# Patient Record
Sex: Female | Born: 1935 | Race: White | Hispanic: No | Smoking: Never smoker
Health system: Southern US, Community
[De-identification: ages and names within clinical notes are randomized; demographics above are authoritative.]

## PROBLEM LIST (undated history)

## (undated) DIAGNOSIS — E559 Vitamin D deficiency, unspecified: Secondary | ICD-10-CM

## (undated) DIAGNOSIS — D126 Benign neoplasm of colon, unspecified: Secondary | ICD-10-CM

## (undated) DIAGNOSIS — J209 Acute bronchitis, unspecified: Secondary | ICD-10-CM

## (undated) DIAGNOSIS — K219 Gastro-esophageal reflux disease without esophagitis: Secondary | ICD-10-CM

## (undated) DIAGNOSIS — I10 Essential (primary) hypertension: Secondary | ICD-10-CM

## (undated) DIAGNOSIS — N289 Disorder of kidney and ureter, unspecified: Secondary | ICD-10-CM

## (undated) DIAGNOSIS — K5730 Diverticulosis of large intestine without perforation or abscess without bleeding: Secondary | ICD-10-CM

## (undated) DIAGNOSIS — E039 Hypothyroidism, unspecified: Secondary | ICD-10-CM

## (undated) DIAGNOSIS — E7800 Pure hypercholesterolemia, unspecified: Secondary | ICD-10-CM

## (undated) DIAGNOSIS — N183 Chronic kidney disease, stage 3 (moderate): Secondary | ICD-10-CM

## (undated) DIAGNOSIS — I872 Venous insufficiency (chronic) (peripheral): Secondary | ICD-10-CM

## (undated) DIAGNOSIS — F411 Generalized anxiety disorder: Secondary | ICD-10-CM

## (undated) DIAGNOSIS — R002 Palpitations: Secondary | ICD-10-CM

## (undated) DIAGNOSIS — M1990 Unspecified osteoarthritis, unspecified site: Secondary | ICD-10-CM

## (undated) HISTORY — PX: CATARACT EXTRACTION: SUR2

## (undated) HISTORY — DX: Unspecified osteoarthritis, unspecified site: M19.90

## (undated) HISTORY — DX: Essential (primary) hypertension: I10

## (undated) HISTORY — DX: Vitamin D deficiency, unspecified: E55.9

## (undated) HISTORY — DX: Pure hypercholesterolemia, unspecified: E78.00

## (undated) HISTORY — DX: Acute bronchitis, unspecified: J20.9

## (undated) HISTORY — DX: Benign neoplasm of colon, unspecified: D12.6

## (undated) HISTORY — DX: Diverticulosis of large intestine without perforation or abscess without bleeding: K57.30

## (undated) HISTORY — PX: ABDOMINAL HYSTERECTOMY: SHX81

## (undated) HISTORY — PX: BREAST BIOPSY: SHX20

## (undated) HISTORY — DX: Generalized anxiety disorder: F41.1

## (undated) HISTORY — DX: Hypothyroidism, unspecified: E03.9

## (undated) HISTORY — DX: Gastro-esophageal reflux disease without esophagitis: K21.9

## (undated) HISTORY — DX: Palpitations: R00.2

## (undated) HISTORY — DX: Venous insufficiency (chronic) (peripheral): I87.2

## (undated) HISTORY — DX: Disorder of kidney and ureter, unspecified: N28.9

---

## 1997-09-23 HISTORY — PX: TOTAL KNEE ARTHROPLASTY: SHX125

## 1998-05-29 ENCOUNTER — Emergency Department (HOSPITAL_COMMUNITY): Admission: EM | Admit: 1998-05-29 | Discharge: 1998-05-29 | Payer: Self-pay | Admitting: Emergency Medicine

## 2003-04-24 ENCOUNTER — Inpatient Hospital Stay (HOSPITAL_COMMUNITY): Admission: EM | Admit: 2003-04-24 | Discharge: 2003-05-03 | Payer: Self-pay

## 2003-04-26 ENCOUNTER — Encounter: Payer: Self-pay | Admitting: Pulmonary Disease

## 2003-04-27 ENCOUNTER — Encounter: Payer: Self-pay | Admitting: Cardiology

## 2003-04-29 ENCOUNTER — Encounter: Payer: Self-pay | Admitting: Pulmonary Disease

## 2003-05-02 ENCOUNTER — Encounter: Payer: Self-pay | Admitting: Pulmonary Disease

## 2003-05-13 ENCOUNTER — Ambulatory Visit (HOSPITAL_COMMUNITY): Admission: RE | Admit: 2003-05-13 | Discharge: 2003-05-13 | Payer: Self-pay | Admitting: Pulmonary Disease

## 2003-05-13 ENCOUNTER — Encounter: Payer: Self-pay | Admitting: Pulmonary Disease

## 2003-08-29 ENCOUNTER — Ambulatory Visit (HOSPITAL_COMMUNITY): Admission: RE | Admit: 2003-08-29 | Discharge: 2003-08-29 | Payer: Self-pay | Admitting: Obstetrics and Gynecology

## 2003-11-27 ENCOUNTER — Emergency Department (HOSPITAL_COMMUNITY): Admission: AD | Admit: 2003-11-27 | Discharge: 2003-11-27 | Payer: Self-pay | Admitting: Family Medicine

## 2004-06-18 ENCOUNTER — Emergency Department (HOSPITAL_COMMUNITY): Admission: EM | Admit: 2004-06-18 | Discharge: 2004-06-18 | Payer: Self-pay | Admitting: Family Medicine

## 2004-06-22 ENCOUNTER — Emergency Department (HOSPITAL_COMMUNITY): Admission: EM | Admit: 2004-06-22 | Discharge: 2004-06-22 | Payer: Self-pay | Admitting: Family Medicine

## 2004-06-27 ENCOUNTER — Emergency Department (HOSPITAL_COMMUNITY): Admission: EM | Admit: 2004-06-27 | Discharge: 2004-06-27 | Payer: Self-pay | Admitting: Family Medicine

## 2004-07-26 ENCOUNTER — Ambulatory Visit: Payer: Self-pay | Admitting: Pulmonary Disease

## 2004-08-01 ENCOUNTER — Ambulatory Visit: Payer: Self-pay | Admitting: Pulmonary Disease

## 2004-08-15 ENCOUNTER — Ambulatory Visit: Payer: Self-pay | Admitting: Pulmonary Disease

## 2004-10-01 ENCOUNTER — Ambulatory Visit (HOSPITAL_COMMUNITY): Admission: RE | Admit: 2004-10-01 | Discharge: 2004-10-01 | Payer: Self-pay | Admitting: Obstetrics and Gynecology

## 2004-10-24 ENCOUNTER — Ambulatory Visit: Payer: Self-pay | Admitting: Pulmonary Disease

## 2005-01-28 ENCOUNTER — Ambulatory Visit: Payer: Self-pay | Admitting: Pulmonary Disease

## 2005-02-12 ENCOUNTER — Ambulatory Visit: Payer: Self-pay | Admitting: Gastroenterology

## 2005-02-14 ENCOUNTER — Ambulatory Visit: Payer: Self-pay | Admitting: Gastroenterology

## 2005-02-25 ENCOUNTER — Ambulatory Visit: Payer: Self-pay | Admitting: Gastroenterology

## 2005-05-31 ENCOUNTER — Ambulatory Visit: Payer: Self-pay | Admitting: Pulmonary Disease

## 2005-07-22 ENCOUNTER — Emergency Department (HOSPITAL_COMMUNITY): Admission: EM | Admit: 2005-07-22 | Discharge: 2005-07-23 | Payer: Self-pay | Admitting: Emergency Medicine

## 2005-08-30 ENCOUNTER — Ambulatory Visit: Payer: Self-pay | Admitting: Pulmonary Disease

## 2005-11-21 ENCOUNTER — Ambulatory Visit: Payer: Self-pay | Admitting: Pulmonary Disease

## 2005-11-22 ENCOUNTER — Ambulatory Visit: Payer: Self-pay

## 2005-11-25 ENCOUNTER — Ambulatory Visit: Payer: Self-pay | Admitting: Pulmonary Disease

## 2005-11-25 ENCOUNTER — Ambulatory Visit: Payer: Self-pay | Admitting: Infectious Diseases

## 2005-11-25 ENCOUNTER — Inpatient Hospital Stay (HOSPITAL_COMMUNITY): Admission: AD | Admit: 2005-11-25 | Discharge: 2005-12-08 | Payer: Self-pay | Admitting: Pulmonary Disease

## 2005-12-12 ENCOUNTER — Ambulatory Visit: Payer: Self-pay | Admitting: Pulmonary Disease

## 2005-12-22 ENCOUNTER — Emergency Department (HOSPITAL_COMMUNITY): Admission: EM | Admit: 2005-12-22 | Discharge: 2005-12-22 | Payer: Self-pay | Admitting: Emergency Medicine

## 2006-03-12 ENCOUNTER — Ambulatory Visit: Payer: Self-pay | Admitting: Pulmonary Disease

## 2006-03-13 ENCOUNTER — Emergency Department (HOSPITAL_COMMUNITY): Admission: EM | Admit: 2006-03-13 | Discharge: 2006-03-14 | Payer: Self-pay | Admitting: Emergency Medicine

## 2006-03-20 ENCOUNTER — Ambulatory Visit: Payer: Self-pay | Admitting: Pulmonary Disease

## 2006-04-28 ENCOUNTER — Ambulatory Visit: Payer: Self-pay | Admitting: Pulmonary Disease

## 2006-06-11 ENCOUNTER — Ambulatory Visit: Payer: Self-pay | Admitting: Pulmonary Disease

## 2006-07-16 ENCOUNTER — Ambulatory Visit: Payer: Self-pay | Admitting: Pulmonary Disease

## 2006-09-10 ENCOUNTER — Ambulatory Visit: Payer: Self-pay | Admitting: Pulmonary Disease

## 2006-09-27 ENCOUNTER — Emergency Department (HOSPITAL_COMMUNITY): Admission: EM | Admit: 2006-09-27 | Discharge: 2006-09-27 | Payer: Self-pay | Admitting: Emergency Medicine

## 2007-01-08 ENCOUNTER — Ambulatory Visit: Payer: Self-pay | Admitting: Pulmonary Disease

## 2007-01-08 LAB — CONVERTED CEMR LAB
ALT: 14 units/L (ref 0–40)
AST: 17 units/L (ref 0–37)
Albumin: 3.7 g/dL (ref 3.5–5.2)
Alkaline Phosphatase: 71 units/L (ref 39–117)
BUN: 24 mg/dL — ABNORMAL HIGH (ref 6–23)
Basophils Absolute: 0 10*3/uL (ref 0.0–0.1)
Basophils Relative: 0.3 % (ref 0.0–1.0)
Bilirubin, Direct: 0.1 mg/dL (ref 0.0–0.3)
CO2: 31 meq/L (ref 19–32)
Calcium: 9.6 mg/dL (ref 8.4–10.5)
Chloride: 103 meq/L (ref 96–112)
Creatinine, Ser: 1.2 mg/dL (ref 0.4–1.2)
Eosinophils Absolute: 0 10*3/uL (ref 0.0–0.6)
Eosinophils Relative: 0.8 % (ref 0.0–5.0)
GFR calc Af Amer: 54 mL/min
GFR calc non Af Amer: 45 mL/min
Glucose, Bld: 106 mg/dL — ABNORMAL HIGH (ref 70–99)
HCT: 40.3 % (ref 36.0–46.0)
Hemoglobin: 13.4 g/dL (ref 12.0–15.0)
Lymphocytes Relative: 42.3 % (ref 12.0–46.0)
MCHC: 33.2 g/dL (ref 30.0–36.0)
MCV: 85.1 fL (ref 78.0–100.0)
Monocytes Absolute: 0.4 10*3/uL (ref 0.2–0.7)
Monocytes Relative: 7.1 % (ref 3.0–11.0)
Neutro Abs: 3.2 10*3/uL (ref 1.4–7.7)
Neutrophils Relative %: 49.5 % (ref 43.0–77.0)
Platelets: 203 10*3/uL (ref 150–400)
Potassium: 4.1 meq/L (ref 3.5–5.1)
RBC: 4.74 M/uL (ref 3.87–5.11)
RDW: 13.7 % (ref 11.5–14.6)
Sodium: 141 meq/L (ref 135–145)
TSH: 3.02 microintl units/mL (ref 0.35–5.50)
Total Bilirubin: 0.7 mg/dL (ref 0.3–1.2)
Total Protein: 7 g/dL (ref 6.0–8.3)
WBC: 6.2 10*3/uL (ref 4.5–10.5)

## 2007-07-07 ENCOUNTER — Ambulatory Visit: Payer: Self-pay | Admitting: Pulmonary Disease

## 2007-07-07 LAB — CONVERTED CEMR LAB
ALT: 13 units/L (ref 0–35)
AST: 16 units/L (ref 0–37)
Albumin: 3.8 g/dL (ref 3.5–5.2)
Alkaline Phosphatase: 73 units/L (ref 39–117)
BUN: 28 mg/dL — ABNORMAL HIGH (ref 6–23)
Basophils Absolute: 0 10*3/uL (ref 0.0–0.1)
Basophils Relative: 0.4 % (ref 0.0–1.0)
Bilirubin, Direct: 0.1 mg/dL (ref 0.0–0.3)
CO2: 32 meq/L (ref 19–32)
Calcium: 9.6 mg/dL (ref 8.4–10.5)
Chloride: 102 meq/L (ref 96–112)
Creatinine, Ser: 1.4 mg/dL — ABNORMAL HIGH (ref 0.4–1.2)
Eosinophils Absolute: 0.1 10*3/uL (ref 0.0–0.6)
Eosinophils Relative: 0.9 % (ref 0.0–5.0)
GFR calc Af Amer: 45 mL/min
GFR calc non Af Amer: 38 mL/min
Glucose, Bld: 102 mg/dL — ABNORMAL HIGH (ref 70–99)
HCT: 40.3 % (ref 36.0–46.0)
Hemoglobin: 12.8 g/dL (ref 12.0–15.0)
Lymphocytes Relative: 33.2 % (ref 12.0–46.0)
MCHC: 31.9 g/dL (ref 30.0–36.0)
MCV: 84.4 fL (ref 78.0–100.0)
Monocytes Absolute: 0.5 10*3/uL (ref 0.2–0.7)
Monocytes Relative: 8.7 % (ref 3.0–11.0)
Neutro Abs: 3.3 10*3/uL (ref 1.4–7.7)
Neutrophils Relative %: 56.8 % (ref 43.0–77.0)
Platelets: 233 10*3/uL (ref 150–400)
Potassium: 5.3 meq/L — ABNORMAL HIGH (ref 3.5–5.1)
Pro B Natriuretic peptide (BNP): 61 pg/mL (ref 0.0–100.0)
RBC: 4.78 M/uL (ref 3.87–5.11)
RDW: 14.1 % (ref 11.5–14.6)
Sodium: 140 meq/L (ref 135–145)
Total Bilirubin: 0.5 mg/dL (ref 0.3–1.2)
Total Protein: 7.2 g/dL (ref 6.0–8.3)
WBC: 5.8 10*3/uL (ref 4.5–10.5)

## 2007-07-08 DIAGNOSIS — K219 Gastro-esophageal reflux disease without esophagitis: Secondary | ICD-10-CM | POA: Insufficient documentation

## 2007-07-08 DIAGNOSIS — M1990 Unspecified osteoarthritis, unspecified site: Secondary | ICD-10-CM

## 2007-07-08 DIAGNOSIS — E7800 Pure hypercholesterolemia, unspecified: Secondary | ICD-10-CM | POA: Insufficient documentation

## 2007-07-08 DIAGNOSIS — E039 Hypothyroidism, unspecified: Secondary | ICD-10-CM | POA: Insufficient documentation

## 2007-07-08 DIAGNOSIS — F411 Generalized anxiety disorder: Secondary | ICD-10-CM | POA: Insufficient documentation

## 2007-07-08 DIAGNOSIS — I10 Essential (primary) hypertension: Secondary | ICD-10-CM

## 2007-07-08 DIAGNOSIS — I872 Venous insufficiency (chronic) (peripheral): Secondary | ICD-10-CM

## 2007-07-08 DIAGNOSIS — Z8679 Personal history of other diseases of the circulatory system: Secondary | ICD-10-CM | POA: Insufficient documentation

## 2007-07-08 DIAGNOSIS — J209 Acute bronchitis, unspecified: Secondary | ICD-10-CM

## 2007-07-23 ENCOUNTER — Ambulatory Visit: Payer: Self-pay | Admitting: Pulmonary Disease

## 2007-07-23 LAB — CONVERTED CEMR LAB
BUN: 27 mg/dL — ABNORMAL HIGH (ref 6–23)
CO2: 31 meq/L (ref 19–32)
Calcium: 9.3 mg/dL (ref 8.4–10.5)
Chloride: 102 meq/L (ref 96–112)
Creatinine, Ser: 1.5 mg/dL — ABNORMAL HIGH (ref 0.4–1.2)
GFR calc Af Amer: 42 mL/min
GFR calc non Af Amer: 35 mL/min
Glucose, Bld: 111 mg/dL — ABNORMAL HIGH (ref 70–99)
Potassium: 4.8 meq/L (ref 3.5–5.1)
Sodium: 140 meq/L (ref 135–145)

## 2007-09-15 ENCOUNTER — Encounter: Payer: Self-pay | Admitting: Pulmonary Disease

## 2007-11-09 ENCOUNTER — Ambulatory Visit: Payer: Self-pay | Admitting: Pulmonary Disease

## 2007-11-09 DIAGNOSIS — K5730 Diverticulosis of large intestine without perforation or abscess without bleeding: Secondary | ICD-10-CM

## 2007-11-09 DIAGNOSIS — D126 Benign neoplasm of colon, unspecified: Secondary | ICD-10-CM

## 2007-11-24 ENCOUNTER — Encounter: Payer: Self-pay | Admitting: Pulmonary Disease

## 2007-12-13 ENCOUNTER — Emergency Department (HOSPITAL_COMMUNITY): Admission: EM | Admit: 2007-12-13 | Discharge: 2007-12-13 | Payer: Self-pay | Admitting: Family Medicine

## 2008-03-03 ENCOUNTER — Encounter: Payer: Self-pay | Admitting: Pulmonary Disease

## 2008-05-09 ENCOUNTER — Ambulatory Visit: Payer: Self-pay | Admitting: Pulmonary Disease

## 2008-05-09 DIAGNOSIS — H409 Unspecified glaucoma: Secondary | ICD-10-CM | POA: Insufficient documentation

## 2008-05-10 LAB — CONVERTED CEMR LAB
ALT: 14 units/L (ref 0–35)
AST: 15 units/L (ref 0–37)
Albumin: 4 g/dL (ref 3.5–5.2)
Alkaline Phosphatase: 64 units/L (ref 39–117)
BUN: 44 mg/dL — ABNORMAL HIGH (ref 6–23)
Basophils Absolute: 0.1 10*3/uL (ref 0.0–0.1)
Basophils Relative: 0.7 % (ref 0.0–3.0)
Bilirubin, Direct: 0.1 mg/dL (ref 0.0–0.3)
CO2: 30 meq/L (ref 19–32)
Calcium: 9.2 mg/dL (ref 8.4–10.5)
Chloride: 106 meq/L (ref 96–112)
Creatinine, Ser: 1.9 mg/dL — ABNORMAL HIGH (ref 0.4–1.2)
Eosinophils Absolute: 0 10*3/uL (ref 0.0–0.7)
Eosinophils Relative: 0.5 % (ref 0.0–5.0)
GFR calc Af Amer: 32 mL/min
GFR calc non Af Amer: 26 mL/min
Glucose, Bld: 105 mg/dL — ABNORMAL HIGH (ref 70–99)
HCT: 38.5 % (ref 36.0–46.0)
Hemoglobin: 13.3 g/dL (ref 12.0–15.0)
Lymphocytes Relative: 30.2 % (ref 12.0–46.0)
MCHC: 34.6 g/dL (ref 30.0–36.0)
MCV: 87.8 fL (ref 78.0–100.0)
Monocytes Absolute: 0.5 10*3/uL (ref 0.1–1.0)
Monocytes Relative: 6.2 % (ref 3.0–12.0)
Neutro Abs: 5.2 10*3/uL (ref 1.4–7.7)
Neutrophils Relative %: 62.4 % (ref 43.0–77.0)
Platelets: 229 10*3/uL (ref 150–400)
Potassium: 4.6 meq/L (ref 3.5–5.1)
RBC: 4.39 M/uL (ref 3.87–5.11)
RDW: 13.6 % (ref 11.5–14.6)
Sodium: 140 meq/L (ref 135–145)
TSH: 3.02 microintl units/mL (ref 0.35–5.50)
Total Bilirubin: 0.8 mg/dL (ref 0.3–1.2)
Total Protein: 7.2 g/dL (ref 6.0–8.3)
WBC: 8.3 10*3/uL (ref 4.5–10.5)

## 2008-06-15 ENCOUNTER — Ambulatory Visit: Payer: Self-pay | Admitting: Pulmonary Disease

## 2008-10-13 ENCOUNTER — Encounter: Payer: Self-pay | Admitting: Pulmonary Disease

## 2008-10-24 ENCOUNTER — Telehealth (INDEPENDENT_AMBULATORY_CARE_PROVIDER_SITE_OTHER): Payer: Self-pay | Admitting: *Deleted

## 2008-11-07 ENCOUNTER — Ambulatory Visit: Payer: Self-pay | Admitting: Pulmonary Disease

## 2008-11-13 DIAGNOSIS — E559 Vitamin D deficiency, unspecified: Secondary | ICD-10-CM | POA: Insufficient documentation

## 2008-11-13 DIAGNOSIS — N259 Disorder resulting from impaired renal tubular function, unspecified: Secondary | ICD-10-CM | POA: Insufficient documentation

## 2008-11-13 LAB — CONVERTED CEMR LAB
ALT: 14 units/L (ref 0–35)
Basophils Relative: 2.6 % (ref 0.0–3.0)
Bilirubin, Direct: 0.1 mg/dL (ref 0.0–0.3)
CO2: 28 meq/L (ref 19–32)
Calcium: 9.5 mg/dL (ref 8.4–10.5)
Chloride: 107 meq/L (ref 96–112)
Creatinine, Ser: 1.7 mg/dL — ABNORMAL HIGH (ref 0.4–1.2)
Eosinophils Relative: 2.1 % (ref 0.0–5.0)
GFR calc non Af Amer: 30 mL/min
Hemoglobin: 13 g/dL (ref 12.0–15.0)
Lymphocytes Relative: 32.7 % (ref 12.0–46.0)
MCHC: 33.4 g/dL (ref 30.0–36.0)
MCV: 90 fL (ref 78.0–100.0)
RBC: 4.33 M/uL (ref 3.87–5.11)
Sodium: 142 meq/L (ref 135–145)
Total Bilirubin: 0.6 mg/dL (ref 0.3–1.2)
Vit D, 25-Hydroxy: 30 ng/mL (ref 30–89)
WBC: 9.3 10*3/uL (ref 4.5–10.5)

## 2008-11-16 ENCOUNTER — Encounter: Payer: Self-pay | Admitting: Pulmonary Disease

## 2008-11-21 HISTORY — PX: TOTAL KNEE ARTHROPLASTY: SHX125

## 2008-12-05 ENCOUNTER — Inpatient Hospital Stay (HOSPITAL_COMMUNITY): Admission: RE | Admit: 2008-12-05 | Discharge: 2008-12-09 | Payer: Self-pay | Admitting: Orthopaedic Surgery

## 2008-12-12 ENCOUNTER — Telehealth (INDEPENDENT_AMBULATORY_CARE_PROVIDER_SITE_OTHER): Payer: Self-pay

## 2008-12-26 ENCOUNTER — Ambulatory Visit: Payer: Self-pay | Admitting: Internal Medicine

## 2009-02-07 ENCOUNTER — Encounter: Payer: Self-pay | Admitting: Pulmonary Disease

## 2009-03-29 ENCOUNTER — Ambulatory Visit: Payer: Self-pay | Admitting: Pulmonary Disease

## 2009-03-31 LAB — CONVERTED CEMR LAB
BUN: 54 mg/dL — ABNORMAL HIGH (ref 6–23)
Basophils Relative: 3.3 % — ABNORMAL HIGH (ref 0.0–3.0)
Calcium: 9.4 mg/dL (ref 8.4–10.5)
Creatinine, Ser: 2 mg/dL — ABNORMAL HIGH (ref 0.4–1.2)
Eosinophils Relative: 1.3 % (ref 0.0–5.0)
GFR calc non Af Amer: 24.76 mL/min (ref >60)
HCT: 40.8 % (ref 36.0–46.0)
Hemoglobin: 13.6 g/dL (ref 12.0–15.0)
Iron: 62 ug/dL (ref 42–145)
MCV: 85.3 fL (ref 78.0–100.0)
Monocytes Relative: 3.1 % (ref 3.0–12.0)
Platelets: 85 10*3/uL — ABNORMAL LOW (ref 150.0–400.0)
Saturation Ratios: 16.6 % — ABNORMAL LOW (ref 20.0–50.0)
Transferrin: 267.1 mg/dL (ref 212.0–360.0)
Vit D, 25-Hydroxy: 39 ng/mL (ref 30–89)
WBC: 17.7 10*3/uL — ABNORMAL HIGH (ref 4.5–10.5)

## 2009-04-03 ENCOUNTER — Encounter: Payer: Self-pay | Admitting: Pulmonary Disease

## 2009-04-19 ENCOUNTER — Encounter: Payer: Self-pay | Admitting: Pulmonary Disease

## 2009-06-06 ENCOUNTER — Encounter: Payer: Self-pay | Admitting: Pulmonary Disease

## 2009-06-21 ENCOUNTER — Ambulatory Visit: Payer: Self-pay | Admitting: Pulmonary Disease

## 2009-07-14 ENCOUNTER — Telehealth: Payer: Self-pay | Admitting: Pulmonary Disease

## 2009-07-27 ENCOUNTER — Ambulatory Visit: Payer: Self-pay | Admitting: Pulmonary Disease

## 2009-07-27 LAB — CONVERTED CEMR LAB
CO2: 32 meq/L (ref 19–32)
Calcium: 9.6 mg/dL (ref 8.4–10.5)
Creatinine, Ser: 1.8 mg/dL — ABNORMAL HIGH (ref 0.4–1.2)
GFR calc non Af Amer: 27.94 mL/min (ref >60)
Sodium: 143 meq/L (ref 135–145)

## 2009-08-23 HISTORY — PX: TOE AMPUTATION: SHX809

## 2009-10-19 ENCOUNTER — Encounter: Payer: Self-pay | Admitting: Pulmonary Disease

## 2009-12-04 ENCOUNTER — Telehealth: Payer: Self-pay | Admitting: Pulmonary Disease

## 2009-12-08 ENCOUNTER — Ambulatory Visit: Payer: Self-pay | Admitting: Pulmonary Disease

## 2010-01-05 ENCOUNTER — Telehealth (INDEPENDENT_AMBULATORY_CARE_PROVIDER_SITE_OTHER): Payer: Self-pay | Admitting: *Deleted

## 2010-01-11 ENCOUNTER — Telehealth (INDEPENDENT_AMBULATORY_CARE_PROVIDER_SITE_OTHER): Payer: Self-pay | Admitting: *Deleted

## 2010-01-11 ENCOUNTER — Ambulatory Visit: Payer: Self-pay | Admitting: Pulmonary Disease

## 2010-02-20 ENCOUNTER — Encounter: Payer: Self-pay | Admitting: Pulmonary Disease

## 2010-03-27 ENCOUNTER — Telehealth (INDEPENDENT_AMBULATORY_CARE_PROVIDER_SITE_OTHER): Payer: Self-pay | Admitting: *Deleted

## 2010-04-06 ENCOUNTER — Ambulatory Visit: Payer: Self-pay | Admitting: Pulmonary Disease

## 2010-04-10 DIAGNOSIS — B029 Zoster without complications: Secondary | ICD-10-CM | POA: Insufficient documentation

## 2010-05-03 ENCOUNTER — Ambulatory Visit: Payer: Self-pay | Admitting: Pulmonary Disease

## 2010-05-04 LAB — CONVERTED CEMR LAB
ALT: 11 units/L (ref 0–35)
AST: 17 units/L (ref 0–37)
BUN: 42 mg/dL — ABNORMAL HIGH (ref 6–23)
Basophils Relative: 0.6 % (ref 0.0–3.0)
Bilirubin, Direct: 0.1 mg/dL (ref 0.0–0.3)
Chloride: 100 meq/L (ref 96–112)
Eosinophils Relative: 0.4 % (ref 0.0–5.0)
GFR calc non Af Amer: 23.74 mL/min (ref >60)
HCT: 39.6 % (ref 36.0–46.0)
Lymphs Abs: 2.3 10*3/uL (ref 0.7–4.0)
Monocytes Relative: 7.8 % (ref 3.0–12.0)
Neutrophils Relative %: 61.8 % (ref 43.0–77.0)
Platelets: 260 10*3/uL (ref 150.0–400.0)
Potassium: 4.3 meq/L (ref 3.5–5.1)
RBC: 4.31 M/uL (ref 3.87–5.11)
Total Bilirubin: 0.6 mg/dL (ref 0.3–1.2)
Total Protein: 6.9 g/dL (ref 6.0–8.3)
WBC: 7.7 10*3/uL (ref 4.5–10.5)

## 2010-05-11 ENCOUNTER — Telehealth: Payer: Self-pay | Admitting: Pulmonary Disease

## 2010-06-05 ENCOUNTER — Encounter: Payer: Self-pay | Admitting: Pulmonary Disease

## 2010-06-07 ENCOUNTER — Ambulatory Visit: Payer: Self-pay | Admitting: Pulmonary Disease

## 2010-07-09 ENCOUNTER — Encounter: Payer: Self-pay | Admitting: Pulmonary Disease

## 2010-10-09 ENCOUNTER — Emergency Department (HOSPITAL_COMMUNITY)
Admission: EM | Admit: 2010-10-09 | Discharge: 2010-10-09 | Payer: Self-pay | Source: Home / Self Care | Admitting: Emergency Medicine

## 2010-10-10 LAB — CBC
HCT: 39.7 % (ref 36.0–46.0)
Hemoglobin: 12.6 g/dL (ref 12.0–15.0)
MCH: 28.8 pg (ref 26.0–34.0)
MCHC: 31.7 g/dL (ref 30.0–36.0)
MCV: 90.6 fL (ref 78.0–100.0)
Platelets: 278 10*3/uL (ref 150–400)
RBC: 4.38 MIL/uL (ref 3.87–5.11)
RDW: 13.2 % (ref 11.5–15.5)
WBC: 11.4 10*3/uL — ABNORMAL HIGH (ref 4.0–10.5)

## 2010-10-10 LAB — DIFFERENTIAL
Basophils Absolute: 0 10*3/uL (ref 0.0–0.1)
Basophils Relative: 0 % (ref 0–1)
Eosinophils Absolute: 0.1 10*3/uL (ref 0.0–0.7)
Eosinophils Relative: 1 % (ref 0–5)
Lymphocytes Relative: 18 % (ref 12–46)
Lymphs Abs: 2.1 10*3/uL (ref 0.7–4.0)
Monocytes Absolute: 0.8 10*3/uL (ref 0.1–1.0)
Monocytes Relative: 7 % (ref 3–12)
Neutro Abs: 8.5 10*3/uL — ABNORMAL HIGH (ref 1.7–7.7)
Neutrophils Relative %: 74 % (ref 43–77)

## 2010-10-10 LAB — URINALYSIS, ROUTINE W REFLEX MICROSCOPIC
Bilirubin Urine: NEGATIVE
Hgb urine dipstick: NEGATIVE
Ketones, ur: NEGATIVE mg/dL
Nitrite: NEGATIVE
Protein, ur: NEGATIVE mg/dL
Specific Gravity, Urine: 1.011 (ref 1.005–1.030)
Urine Glucose, Fasting: NEGATIVE mg/dL
Urobilinogen, UA: 0.2 mg/dL (ref 0.0–1.0)
pH: 5.5 (ref 5.0–8.0)

## 2010-10-10 LAB — COMPREHENSIVE METABOLIC PANEL
ALT: 12 U/L (ref 0–35)
AST: 16 U/L (ref 0–37)
Albumin: 3.7 g/dL (ref 3.5–5.2)
Alkaline Phosphatase: 57 U/L (ref 39–117)
BUN: 56 mg/dL — ABNORMAL HIGH (ref 6–23)
CO2: 32 mEq/L (ref 19–32)
Calcium: 9.6 mg/dL (ref 8.4–10.5)
Chloride: 93 mEq/L — ABNORMAL LOW (ref 96–112)
Creatinine, Ser: 2.67 mg/dL — ABNORMAL HIGH (ref 0.4–1.2)
GFR calc Af Amer: 20 mL/min — ABNORMAL LOW (ref >60)
GFR calc non Af Amer: 17 mL/min — ABNORMAL LOW (ref >60)
Glucose, Bld: 146 mg/dL — ABNORMAL HIGH (ref 70–99)
Potassium: 4.3 mEq/L (ref 3.5–5.1)
Sodium: 138 mEq/L (ref 135–145)
Total Bilirubin: 0.2 mg/dL — ABNORMAL LOW (ref 0.3–1.2)
Total Protein: 6.6 g/dL (ref 6.0–8.3)

## 2010-10-10 LAB — LIPASE, BLOOD: Lipase: 21 U/L (ref 11–59)

## 2010-10-10 LAB — URINE MICROSCOPIC-ADD ON

## 2010-10-11 ENCOUNTER — Telehealth: Payer: Self-pay | Admitting: Pulmonary Disease

## 2010-10-15 ENCOUNTER — Ambulatory Visit
Admission: RE | Admit: 2010-10-15 | Discharge: 2010-10-15 | Payer: Self-pay | Source: Home / Self Care | Attending: Pulmonary Disease | Admitting: Pulmonary Disease

## 2010-10-15 DIAGNOSIS — R609 Edema, unspecified: Secondary | ICD-10-CM | POA: Insufficient documentation

## 2010-10-25 NOTE — Assessment & Plan Note (Signed)
Summary: rov 3 months/ cxr/ fasting labs ///kp   Primary Care Laquentin Loudermilk:  Kriste Basque  CC:  4 month ROV & review of mult medical problems....  History of Present Illness: 77 y/o WF here for a follow up visit... she has mult med problems as noted below...   ~  3/09 saw DrCohen at [facility] clinic for f/u of prev corneal transplant & glaucoma laser surg... they sent her to [facility] w/ further surg by DrMuir due to high eye pressures... on glaucoma gtts.  ~  she continues to be followed every 56mo by [facility] who does her Cholesterol & Thyroid checks... she tells me that she is doing well on her Crestor5 & Synthroid 125mg - 1/2 tab daily...   ~  March 29, 2009:  she is s/p left TKR by DrYates 3/10- went to [facility] and now back home doing well... she had f/u at [name] 5/10 on 2 drops and doing satis... she saw DrGegick 5/10 w/ Fasting labs done- her Chol was elevated but she was off the Crestor due to cost, thyroid function OK on her Synthroid... labs showed BUN=54, Creat=2.0 and we decr Demedex to20mg /d & Aldactone25 to 1/2 tab daily...   ~  July 27, 2009:  since she was here last she has decr the diuretics as above- weight up 3# w/ 1+pitting in LE's, BUN= 38, Creat=1.8.Marland KitchenMarland Kitchen she's had right second toe part amput by DrBednarz (hammer toe + spur) & epidural steroid shot by DrNewton for LBP (helped)... she requests MOBIC refill & has already had her 2010 flu  shot...   ~  January 11, 2010:  called c/o cough, congestion, yellow phlegm & incr SOB... no f/c/s, no CP, etc... she took ZPak & phlegm cleared but still congested, wheezing, coughing, SOB> exam w/ exp wheezing & rhonchi; CXR w/ incr markings (?vague nodular opac on left)> we decided to Rx w/ Depo/ Pred, Mucinex/ Fluids, Tussionex Prn, & ROV recheck... Otherw BP= OK;  Chol Rx ch by DrGegick to Zocor20;  Thyroid stable on 5mcg/d;  using Alpraz 0.25 to sleep at night...   ~  May 03, 2010:  she had Shingles right T9-10 distrib> resolved after  Valtrex, Vicodin Rx... c/o pain right hip area w/ shot DrYates- no benefit;  then eval DrNewton w/ epid steroid shot- only sl better... BP remains controlled on meds & fluid status stable;  Chol looks good on Simva20 per DrGegick, & also on Synthroid 1/2 of the dose... she wants f/u labs here today...   Current Problem List:  GLAUCOMA (ICD-365.9) - see above, mult eye problems... on eye drops per Ophthalmology at [facility]  ASTHMATIC BRONCHITIS, ACUTE (ICD-466.0) - started w/ URI cough yellow sputum w/ min improvement after ZPak... has incr congestion wheezing rhonchi & we decided to Rx w/ Depo/ Pred/ Mucinex/ Tussionex...  ~  CXR 4/11 showed vague nodular densities on left... we plan f/u film after Rx.  HYPERTENSION (ICD-401.9) - controlled on ASA 81mg /d,  CARTIAXT 180mg /d, DEMEDEX 20mg /d and ALDACTONE 25mg - 1/2 daily... BP= 130/78 and she is tol meds well... denies HA, fatigue, visual changes, CP, palipit, dizziness, syncope, dyspnea, etc... sl incr edema noted...  ~  7/10:  labs showed BUN=54, Creat=2.0 & rec to decr diuretics in half= one Demedex & 1/2 Aldactone.  ~  11/10:  labs showed BUN=38, Creat=1.8 & rec to keep same meds.  ~  8/11:  labs showed BUN=42, Creat=2.1, K=4.3  PALPITATIONS, HX OF (ICD-V12.50)  VENOUS INSUFFICIENCY (ICD-459.81) - swelling  controlled w/ diuretics, low sodium diet, etc...  HYPERCHOLESTEROLEMIA (ICD-272.0) - regulated and Rx'd by DrGegick... she tells me he switched her to ZOCOR 20mg /d from the prev Crestor5 & Lescol that she has been on for years... she was intol to Lipitor due to muscle aching... she says she has to battle her insurance company for meds that DrG wants.  ~  she brought labs from DrG 5/10 (off med due to $$$) w/ TChol 285, TG 204, HDL 69, LDL 195... "he was mad at me"  ~  FLP 5/11 from [facility] showed TChol 202, TG 60, HDL 112, LDL 99  HYPOTHYROIDISM (ICD-244.9) - also followed by DrGegick on SYNTHROID - 1/2 daily...  ~  pt brought  labs from [facility] 5/10 w/ TSH= 2.90, FreeT4= 1.09  ~  labs 8/11 showed TSH= 1.87  GERD (ICD-530.81) - uses PRILOSEC 20mg /d... last EGD was 6/06 by DrPatterson showing 3cmHH, reflux...   DIVERTICULOSIS OF COLON (ICD-562.10) & COLONIC POLYPS (ICD-211.3) - last colonoscopy 6/06 was WNL- no abnormality seen...  RENAL INSUFFICIENCY (ICD-588.9)  ~  labs 10/08 showed BUN= 27, Creat= 1.5  ~  labs 8/09 showed BUN= 44, Creat= 1.9  ~  labs 2/10 showed BUN 46, Creat= 1.7  ~  labs 7/10 showed BUN= 54, Creat= 2.0  ~  labs 11/10 showed BUN= 38, Creat= 1.8  ~  labs 8/11 showed BUN= 42, Creat= 2.1  DEGENERATIVE JOINT DISEASE (ICD-715.90) - this is her chief complaint- s/p right TKR in 1999,  left TKR 3/10... on Vicodin Prn.  VITAMIN D DEFICIENCY (ICD-268.9)  ~  labs 8/09 showed Vit D level = 12... rec- start Vit D 50,000 u weekly...  ~  labs 2/10 showed Vit D level = 30... rec- continue Vit D 50K weekly...  ~  labs 7/10 showed Vit D level = 39... rec- change to 1000 u OTC daily.  ~  labs 8/11 showed Vit D level = 38... continue same.  ANXIETY (ICD-300.00) - on ALPRAZOLAM 0.25mg Tid Prn... several deaths in the family... daughter who lives w/ her recently ill...   Preventive Screening-Counseling & Management  Alcohol-Tobacco     Smoking Status: never  Allergies: 1)  Sulfa 2)  Biaxin (Clarithromycin) 3)  Codeine 4)  Morphine  Comments:  Nurse/Medical Assistant: The patient's medications and allergies were reviewed with the patient and were updated in the Medication and Allergy Lists.  Past History:  Past Medical History: GLAUCOMA (ICD-365.9) Hx of ASTHMATIC BRONCHITIS, ACUTE (ICD-466.0) HYPERTENSION (ICD-401.9) PALPITATIONS, HX OF (ICD-V12.50) VENOUS INSUFFICIENCY (ICD-459.81) HYPERCHOLESTEROLEMIA (ICD-272.0) HYPOTHYROIDISM (ICD-244.9) GERD (ICD-530.81) DIVERTICULOSIS OF COLON (ICD-562.10) COLONIC POLYPS (ICD-211.3) RENAL INSUFFICIENCY (ICD-588.9) DEGENERATIVE JOINT DISEASE  (ICD-715.90) VITAMIN D DEFICIENCY (ICD-268.9) ANXIETY (ICD-300.00)  Past Surgical History: S/P cataract surg S/P Hysterectomy S/P benign breast biopsy S/P right total knee replacement in 1999 S/P left TKR 3/10 by DrYates S/P right second toe part amputation by Volney Presser 2010  Family History: Reviewed history from 05/09/2008 and no changes required. mother deceased age 74 from heart problems father deceased age 88 from heart problems  hx of throat cancer 1 sibling alive age 17  hx of DM 1 sibling deceased from cancer  Social History: Reviewed history from 11/07/2008 and no changes required. never smoked no etoh widowed 1 daughter -  hx of DM  Review of Systems      See HPI       The patient complains of decreased hearing, dyspnea on exertion, muscle weakness, and difficulty walking.  The patient denies anorexia, fever, weight loss,  weight gain, vision loss, hoarseness, chest pain, syncope, peripheral edema, prolonged cough, headaches, hemoptysis, abdominal pain, melena, hematochezia, severe indigestion/heartburn, hematuria, incontinence, suspicious skin lesions, transient blindness, depression, unusual weight change, abnormal bleeding, enlarged lymph nodes, and angioedema.    Vital Signs:  Patient profile:   77 year old female Height:      63 inches Weight:      159.13 pounds BMI:     28.29 O2 Sat:      97 % on Room air Temp:     97.9 degrees F oral Pulse rate:   97 / minute BP sitting:   130 / 78  (left arm) Cuff size:   regular  Vitals Entered By: Randell Loop CMA (May 03, 2010 11:27 AM)  O2 Sat at Rest %:  97 O2 Flow:  Room air CC: 4 month ROV & review of mult medical problems... Is Patient Diabetic? No Pain Assessment Patient in pain? yes      Onset of pain  hip and back pain Comments meds updated today with pt   Physical Exam  Additional Exam:  WD, WN, 77 y/o WF in NAD... GENERAL:  Alert & oriented; pleasant & cooperative... HEENT:  [place]/AT, EOM-  full, EACs-clear, TMs-wnl, NOSE-clear, THROAT-clear & wnl. NECK:  Supple w/ fairROM; no JVD; normal carotid impulses w/o bruits; no thyromegaly or nodules palpated; no lymphadenopathy. CHEST:  Clear, decr BS bilat w/o wheezing, rales, or rhonchi heard... HEART:  Regular Rhythm;  gr 1/6 SEM without rubs or gallops detected... ABDOMEN:  Soft & nontender; normal bowel sounds; no organomegaly or masses detected. EXT:  moderate arthritic changes; s/p bilat TKRs; mild varicose veins/ +venous insuffic/ 1+edema. s/p right second toe distal amputation... NEURO:  CN's intact;  no focal neuro deficits... DERM:  No lesions noted; no rash etc...    MISC. Report  Procedure date:  05/03/2010  Findings:      BMP (METABOL)   Sodium                    142 mEq/L                   135-145   Potassium                 4.3 mEq/L                   3.5-5.1   Chloride                  100 mEq/L                   96-112   Carbon Dioxide            31 mEq/L                    19-32   Glucose              [H]  100 mg/dL                   08-65   BUN                  [H]  42 mg/dL                    7-84   Creatinine           [H]  2.1 mg/dL  0.4-1.2   Calcium                   9.5 mg/dL                   1.6-10.9   GFR                       23.74 mL/min                >60   CBC Platelet w/Diff (CBCD)   White Cell Count          7.7 K/uL                    4.5-10.5   Red Cell Count            4.31 Mil/uL                 3.87-5.11   Hemoglobin                13.1 g/dL                   60.4-54.0   Hematocrit                39.6 %                      36.0-46.0   MCV                       91.8 fl                     78.0-100.0   Platelet Count            260.0 K/uL                  150.0-400.0   Neutrophil %              61.8 %                      43.0-77.0   Lymphocyte %              29.4 %                      12.0-46.0   Monocyte %                7.8 %                       3.0-12.0    Eosinophils%              0.4 %                       0.0-5.0   Basophils %               0.6 %                       0.0-3.0  Comments:      Hepatic/Liver Function Panel (HEPATIC)   Total Bilirubin           0.6 mg/dL                   9.8-1.1   Direct Bilirubin          0.1 mg/dL  0.0-0.3   Alkaline Phosphatase      52 U/L                      39-117   AST                       17 U/L                      0-37   ALT                       11 U/L                      0-35   Total Protein             6.9 g/dL                    0.4-5.4   Albumin                   4.1 g/dL                    0.9-8.1  TSH (TSH)   FastTSH                   1.87 uIU/mL                 0.35-5.50  Vitamin D (25-Hydroxy) (19147)  Vitamin D (25-Hydroxy)                             38 ng/mL                    30-89   Impression & Recommendations:  Problem # 1:  HYPERTENSION (ICD-401.9) Controlled>  same meds, stay well hydrated... Her updated medication list for this problem includes:    Cartia Xt 180 Mg Cp24 (Diltiazem hcl coated beads) .[name]... 1 by mouth once daily    Demadex 20 Mg Tabs (Torsemide) .[name]... 1 tab by mouth once daily    Aldactone 25 Mg Tabs (Spironolactone) .[name]... 1/2 tab by mouth once daily in the afternoon...  Orders: TLB-BMP (Basic Metabolic Panel-BMET) (80048-METABOL)  Problem # 2:  VENOUS INSUFFICIENCY (ICD-459.81) Stable>  continue diuretics, no salt, etc...  Problem # 3:  HYPERCHOLESTEROLEMIA (ICD-272.0) Recent FLP from DrGegick was improved>  continue Simva20... Her updated medication list for this problem includes:    Simvastatin 20 Mg Tabs (Simvastatin) .[name]... Take 1 tab by mouth at bedtime  Orders: TLB-Hepatic/Liver Function Pnl (80076-HEPATIC)  Problem # 4:  HYPOTHYROIDISM (ICD-244.9) Stable on the Synthroid  ~62.5mg /d... Her updated medication list for this problem includes:    Synthroid 125 Mcg Tabs (Levothyroxine sodium) .[name]... Take 1/2 tablet by mouth  once daily  Orders: TLB-TSH (Thyroid Stimulating Hormone) (84443-TSH)  Problem # 5:  GERD (ICD-530.81) GI is stable and up to date... Her updated medication list for this problem includes:    Prilosec 20 Mg Cpdr (Omeprazole) .[name]... Take 1 tablet by mouth once a day  Problem # 6:  RENAL INSUFFICIENCY (ICD-588.9) Renal function shows creat= 2.1>  continue same meds & diuretics, stay well hydrated... Orders: TLB-BMP (Basic Metabolic Panel-BMET) (80048-METABOL) TLB-CBC Platelet - w/Differential (85025-CBCD)  Problem # 7:  DEGENERATIVE JOINT DISEASE (ICD-715.90) She has Mobic & Vicodin to use Prn... Her updated medication list  for this problem includes:    Aspirin 81 Mg Tbec (Aspirin) .[name]... Take 1 tablet by mouth once a day    Mobic 7.5 Mg Tabs (Meloxicam) .[name]... Take 1 tab by mouth once daily as needed for arthritis pain...    Hydrocodone-acetaminophen 5-500 Mg Tabs (Hydrocodone-acetaminophen) .[name]... Take 1/2 to 1 tab by mouth three times a day as needed  Problem # 8:  VITAMIN D DEFICIENCY (ICD-268.9) Continue Vit D 1000 u daily... Viot D level = 38. Orders: T-Vitamin D (25-Hydroxy) [id number])  Problem # 9:  ANXIETY (ICD-300.00) Stable>  uses the Alpraz Prn... Her updated medication list for this problem includes:    Alprazolam 0.25 Mg Tabs (Alprazolam) .[name]... Take 1/2 to 1 tablet by mouth three times a day as needed for nerves  Complete Medication List: 1)  Timoptic 0.5 % Soln (Timolol maleate) .[name].. 1 drop in each eye every 12 hours 2)  Fluorometholone 0.1 % Susp (Fluorometholone) .[name].. 1 drop in right eye at bedtime 3)  Aspirin 81 Mg Tbec (Aspirin) .... Take 1 tablet by mouth once a day 4)  Cartia Xt 180 Mg Cp24 (Diltiazem hcl coated beads) .[name].. 1 by mouth once daily 5)  Demadex 20 Mg Tabs (Torsemide) .[name].. 1 tab by mouth once daily 6)  Aldactone 25 Mg Tabs (Spironolactone) .... 1/2 tab by mouth once daily in the afternoon... 7)  Simvastatin 20 Mg Tabs (Simvastatin) .... Take 1 tab  by mouth at bedtime 8)  Synthroid 125 Mcg Tabs (Levothyroxine sodium) .... Take 1/2 tablet by mouth once daily 9)  Prilosec 20 Mg Cpdr (Omeprazole) .... Take 1 tablet by mouth once a day 10)  Mobic 7.5 Mg Tabs (Meloxicam) .... Take 1 tab by mouth once daily as needed for arthritis pain... 11)  Alprazolam 0.25 Mg Tabs (Alprazolam) .... Take 1/2 to 1 tablet by mouth three times a day as needed for nerves 12)  Vitamin D 1000 Unit Caps (Cholecalciferol) .... Take 1 cap by mouth once daily... 13)  Vitamin C 500 Mg Tabs (Ascorbic acid) .... Take 1 tablet by mouth once a day 14)  Mucinex 600 Mg Xr12h-tab (Guaifenesin) .... Take one tablet by mouth two times a day 15)  Hydrocodone-acetaminophen 5-500 Mg Tabs (Hydrocodone-acetaminophen) .... Take 1/2 to 1 tab by mouth three times a day as needed for pain...  Patient Instructions: 1)  Today we updated your med list- see below.... 2)  We refilled your Hydrocodone & Alprazolam for as needed use.Marland KitchenMarland Kitchen 3)  Continue follow up w/ DrYates regarding your right hip predicament... 4)  Call for any questions.Marland KitchenMarland Kitchen 5)  Please schedule a follow-up appointment in 6 months, sooner as needed... Prescriptions: HYDROCODONE-ACETAMINOPHEN 5-500 MG TABS (HYDROCODONE-ACETAMINOPHEN) take 1/2 to 1 tab by mouth three times a day as needed for pain...  #50 x 6   Entered and Authorized by:   Michele Mcalpine MD   Signed by:   Michele Mcalpine MD on 05/03/2010   Method used:   Print then Give to Patient   RxID:   [id number] ALPRAZOLAM 0.25 MG  TABS (ALPRAZOLAM) Take 1/2 to 1 tablet by mouth three times a day as needed for nerves  #100 x 5   Entered and Authorized by:   Michele Mcalpine MD   Signed by:   Michele Mcalpine MD on 05/03/2010   Method used:   Print then Give to Patient   RxID:   [id number]

## 2010-10-25 NOTE — Letter (Signed)
Summary: [facility]   Imported By: Sherian Rein 03/01/2010 09:29:29  _____________________________________________________________________  External Attachment:    Type:   Image     Comment:   External Document

## 2010-10-25 NOTE — Assessment & Plan Note (Signed)
Summary: COUGH/SOB/PT HERE AT 2:30 TODAY/[place]   Primary Care Provider:  Kriste Basque  CC:  5-6 month ROV & add-on for wheezing....  History of Present Illness: 77 y/o WF here for a follow up visit... she has mult med problems as noted below...   ~  3/09 saw DrCohen at [facility] clinic for f/u of prev corneal transplant & glaucoma laser surg... they sent her to [facility] w/ further surg by DrMuir due to high eye pressures... on glaucoma gtts.  ~  she continues to be followed every 27mo by [facility] who does her Cholesterol & Thyroid checks... she tells me that she is doing well on her Crestor5 & Synthroid 125mg - 1/2 tab daily...   ~  March 29, 2009:  she is s/p left TKR by DrYates 3/10- went to [facility] for [facility] and now back home doing well... she had f/u at [facility] 5/10 on 2 drops and doing satis... she saw DrGegick 5/10 w/ Fasting labs done- her Chol was elevated but she was off the Crestor due to cost, thyroid function OK on her Synthroid dose (see below)... labs showed BUN=54, Creat=2.0 and we decr Demedex to20mg /d & Aldactone25 to 1/2 tab daily...   ~  July 27, 2009:  since she was here last she has decr the diuretics as above- weight up 3# w/ 1+pitting in LE's, BUN= 38, Creat=1.8.Marland KitchenMarland Kitchen she's had right second toe part amput by DrBednarz (hammer toe + spur) & epidural steroid shot by DrNewton for LBP (helped)... she requests MOBIC refill & has already had her 2010 flu  shot...   ~  January 11, 2010:  called c/o cough, congestion, yellow phlegm & incr SOB... no f/c/s, no CP, etc... she took ZPak & phlegm cleared but still congested, wheezing, coughing, SOB> exam w/ exp wheezing & rhonchi; CXR w/ incr markings (?vague nodular opac on left)> we decided to Rx w/ Depo/ Pred, Mucinex/ Fluids, Tussionex Prn, & ROV recheck... Otherw BP= OK;  Chol Rx ch by DrGegick to Zocor20;  Thyroid stable on 68mcg/d;  using Alpraz 0.25 to sleep at night...    Current Problem List:  GLAUCOMA (ICD-365.9) - see above... on eye  drops per Ophthalmology.  ASTHMATIC BRONCHITIS, ACUTE (ICD-466.0) - started w/ URI cough yellow sputum w/ min improvement after ZPak... has incr congestion wheezing rhonchi & we decided to Rx w/ Depo/ Pred/ Mucinex/ Tussionex...  ~  CXR 4/11 showed vague nodular densities on left... we plan f/u film after Rx.  HYPERTENSION (ICD-401.9) - controlled on ASA 81mg /d,  CARTIAXT 180mg /d, DEMEDEX 20mg /d and ALDACTONE 25mg - 1/2 daily... BP= 144/74 and she is tol meds well... denies HA, fatigue, visual changes, CP, palipit, dizziness, syncope, dyspnea, etc... sl incr edema noted...  ~  7/10: labs showed BUN=54, Creat=2.0 & rec to decr diuretics in half= one Demedex & 1/2 Aldactone.  ~  11/10: labs showed BUN=38, Creat=1.8 & rec to keep same meds.  PALPITATIONS, HX OF (ICD-V12.50)  VENOUS INSUFFICIENCY (ICD-459.81) - swelling controlled w/ diuretics, low sodium diet, etc...  HYPERCHOLESTEROLEMIA (ICD-272.0) - regulated and Rx'd by DrGegick... she tells me he switched her to ZOCOR 20mg /d from the prev Crestor5 & Lescol that she has been on for years... she was intol to Lipitor due to muscle aching... she says she has to battle her insurance company for meds that DrG wants.  ~  she brought labs from DrG 5/10 (off med due to $$$) w/ TChol 285, TG 204, HDL 69, LDL 195... "he was mad at me"  HYPOTHYROIDISM (ICD-244.9) - also followed by DrGegick on SYNTHROID - 1/2 daily...  ~  pt brought labs from [facility] 5/10 w/ TSH= 2.90, FreeT4= 1.09  GERD (ICD-530.81) - uses PRILOSEC 20mg /d... last EGD was 6/06 by DrPatterson showing 3cmHH, reflux...   DIVERTICULOSIS OF COLON (ICD-562.10) & COLONIC POLYPS (ICD-211.3) - last colonoscopy 6/06 was WNL- no abnormality seen...  RENAL INSUFFICIENCY (ICD-588.9)  ~  labs 10/08 showed BUN= 27, Creat= 1.5  ~  labs 8/09 showed BUN= 44, Creat= 1.9  ~  labs 2/10 showed BUN 46, Creat= 1.7  ~  labs 7/10 showed BUN= 54, Creat= 2.0  ~  labs 11/10 showed BUN= 38, Creat=  1.8  DEGENERATIVE JOINT DISEASE (ICD-715.90) - this is her chief complaint- s/p right TKR in 1999,  left TKR 3/10... on Vicodin Prn.  VITAMIN D DEFICIENCY (ICD-268.9)  ~  labs 8/09 showed Vit D level = 12... rec- start Vit D 50,000 u weekly...  ~  labs 2/10 showed Vit D level = 30... rec- continue Vit D 50K weekly...  ~  labs 7/10 showed Vit D level = 39... rec- change to 1000 u OTC daily.  ANXIETY (ICD-300.00) - on ALPRAZOLAM 0.25mg Tid Prn... several deaths in the family... daughter who lives w/ her recently ill...   Allergies: 1)  Sulfa 2)  Biaxin (Clarithromycin) 3)  Codeine 4)  Morphine  Past History:  Past Medical History: GLAUCOMA (ICD-365.9) Hx of ASTHMATIC BRONCHITIS, ACUTE (ICD-466.0) HYPERTENSION (ICD-401.9) PALPITATIONS, HX OF (ICD-V12.50) VENOUS INSUFFICIENCY (ICD-459.81) HYPERCHOLESTEROLEMIA (ICD-272.0) HYPOTHYROIDISM (ICD-244.9) GERD (ICD-530.81) DIVERTICULOSIS OF COLON (ICD-562.10) COLONIC POLYPS (ICD-211.3) RENAL INSUFFICIENCY (ICD-588.9) DEGENERATIVE JOINT DISEASE (ICD-715.90) VITAMIN D DEFICIENCY (ICD-268.9) ANXIETY (ICD-300.00)  Past Surgical History: S/P cataract surg S/P Hysterectomy S/P benign breast biopsy S/P right total knee replacement in 1999 S/P left TKR 3/10 by DrYates S/P right second toe part amputation by Volney Presser 2010  Family History: Reviewed history from 05/09/2008 and no changes required. mother deceased age 53 from heart problems father deceased age 77 from heart problems  hx of throat cancer 1 sibling alive age 84  hx of DM 1 sibling deceased from cancer  Social History: Reviewed history from 11/07/2008 and no changes required. never smoked no etoh widowed 1 daughter -  hx of DM  Review of Systems      See HPI       The patient complains of decreased hearing, hoarseness, dyspnea on exertion, peripheral edema, prolonged cough, and difficulty walking.  The patient denies anorexia, fever, weight loss, weight gain, vision  loss, chest pain, syncope, headaches, hemoptysis, abdominal pain, melena, hematochezia, severe indigestion/heartburn, hematuria, incontinence, muscle weakness, suspicious skin lesions, transient blindness, depression, unusual weight change, abnormal bleeding, enlarged lymph nodes, and angioedema.    Vital Signs:  Patient profile:   78 year old female Height:      63 inches Weight:      167.38 pounds O2 Sat:      97 % on Room air Temp:     96.2 degrees F oral Pulse rate:   88 / minute BP sitting:   144 / 74  (right arm) Cuff size:   regular  Vitals Entered By: Randell Loop CMA (January 11, 2010 2:13 PM)  O2 Sat at Rest %:  97 O2 Flow:  Room air CC: 5-6 month ROV & add-on for wheezing... Is Patient Diabetic? No Comments meds updated today   Physical Exam  Additional Exam:  WD, WN, 77 y/o WF in NAD... GENERAL:  Alert & oriented; pleasant & cooperative.Marland KitchenMarland Kitchen  HEENT:  Energy/AT, EOM- full, EACs-clear, TMs-wnl, NOSE-clear, THROAT-clear & wnl. NECK:  Supple w/ fairROM; no JVD; normal carotid impulses w/o bruits; no thyromegaly or nodules palpated; no lymphadenopathy. CHEST:  Bilat exp wheezing, diffuse rhonchi, w/o consolidation or rales... HEART:  Regular Rhythm;  gr 1/6 SEM without rubs or gallops detected... ABDOMEN:  Soft & nontender; normal bowel sounds; no organomegaly or masses detected. EXT:  moderate arthritic changes; s/p bilat TKRs; mild varicose veins/ +venous insuffic/ 1+edema. s/p right second toe distal amputation... NEURO:  CN's intact;  no focal neuro deficits... DERM:  No lesions noted; no rash etc...     CXR  Procedure date:  01/11/2010  Findings:      CHEST - 2 VIEW Comparison: 09/27/2006   Findings: Heart size appears normal.   There is no pleural effusion or pulmonary edema   Coarsened interstitial markings are identified within both lungs which are unchanged from prior exam.   There are two nodular densities within the left upper lobe which appears new  from the examination dated 09/27/2006.   No superimposed airspace consolidation.   IMPRESSION:   Indeterminate nodular densities in the left upper lobe.  Recommend more definitive evaluation with CT of the chest.   Read By:  Rosealee Albee,  M.D.   Impression & Recommendations:  Problem # 1:  ASTHMATIC BRONCHITIS, ACUTE (ICD-466.0) She has a bronchitic exac... ZPak helped but congested w/ wheezing/ rhonchi...  REC> Depo80, Pred 10mg  in 7d tapering sched, Mucinex Max Bid, Fluids, Tussionex as needed. The following medications were removed from the medication list:    Keflex 500 Mg Caps (Cephalexin) .[name]... 1 by mouth four times a day    Zithromax Z-pak 250 Mg Tabs (Azithromycin) .[name]... Take as directed Her updated medication list for this problem includes:    Mucinex 600 Mg Xr12h-tab (Guaifenesin) .[name]... Take one tablet by mouth two times a day    Tussionex Pennkinetic Er 8-10 Mg/24ml Lqcr (Chlorpheniramine-hydrocodone) .[name]... Take 1 tsp by mouth every 12 h as needed for cough...  Problem # 2:  HYPERTENSION (ICD-401.9) Controlled-  same meds. Her updated medication list for this problem includes:    Cartia Xt 180 Mg Cp24 (Diltiazem hcl coated beads) .[name]... 1 by mouth once daily    Demadex 20 Mg Tabs (Torsemide) .[name]... 1 tab by mouth once daily    Aldactone 25 Mg Tabs (Spironolactone) .[name]... 1/2 tab by mouth once daily in the afternoon...  Problem # 3:  VENOUS INSUFFICIENCY (ICD-459.81) Needs to elim sodium, elevate legs, wear support hose, etc...  Problem # 4:  HYPERCHOLESTEROLEMIA (ICD-272.0) Now on zocor per DrGegick... The following medications were removed from the medication list:    Crestor 5 Mg Tabs (Rosuvastatin calcium) .[name]... Take 1 tablet by mouth once a day Her updated medication list for this problem includes:    Simvastatin 20 Mg Tabs (Simvastatin) .[name]... Take 1 tab by mouth at bedtime  Problem # 5:  HYPOTHYROIDISM (ICD-244.9) Stable on Synthroid... Her updated  medication list for this problem includes:    Synthroid 125 Mcg Tabs (Levothyroxine sodium) .[name]... Take 1/2 tablet by mouth once daily  Problem # 6:  GERD (ICD-530.81) GI is stable... Her updated medication list for this problem includes:    Prilosec 20 Mg Cpdr (Omeprazole) .[name]... Take 1 tablet by mouth once a day  Problem # 7:  RENAL INSUFFICIENCY (ICD-588.9) GU improved...  Problem # 8:  DEGENERATIVE JOINT DISEASE (ICD-715.90) She uses the Vicodin & Mobic Prn... Her updated medication  list for this problem includes:    Aspirin 81 Mg Tbec (Aspirin) .[name]... Take 1 tablet by mouth once a day    Mobic 7.5 Mg Tabs (Meloxicam) .[name]... Take 1 tab by mouth once daily as needed for arthritis pain...  Complete Medication List: 1)  Timoptic 0.5 % Soln (Timolol maleate) .[name].. 1 drop in each eye every 12 hours 2)  Fluorometholone 0.1 % Susp (Fluorometholone) .[name].. 1 drop in right eye at bedtime 3)  Aspirin 81 Mg Tbec (Aspirin) .... Take 1 tablet by mouth once a day 4)  Cartia Xt 180 Mg Cp24 (Diltiazem hcl coated beads) .[name].. 1 by mouth once daily 5)  Demadex 20 Mg Tabs (Torsemide) .[name].. 1 tab by mouth once daily 6)  Aldactone 25 Mg Tabs (Spironolactone) .... 1/2 tab by mouth once daily in the afternoon... 7)  Simvastatin 20 Mg Tabs (Simvastatin) .... Take 1 tab by mouth at bedtime 8)  Synthroid 125 Mcg Tabs (Levothyroxine sodium) .... Take 1/2 tablet by mouth once daily 9)  Prilosec 20 Mg Cpdr (Omeprazole) .... Take 1 tablet by mouth once a day 10)  Mobic 7.5 Mg Tabs (Meloxicam) .... Take 1 tab by mouth once daily as needed for arthritis pain... 11)  Alprazolam 0.25 Mg Tabs (Alprazolam) .... Take 1 tablet by mouth three times a day as needed for nerves 12)  Vitamin D 1000 Unit Caps (Cholecalciferol) .... Take 1 cap by mouth once daily... 13)  Vitamin C 500 Mg Tabs (Ascorbic acid) .... Take 1 tablet by mouth once a day 14)  Mucinex 600 Mg Xr12h-tab (Guaifenesin) .... Take one tablet by mouth two times a  day 15)  Prednisone 10 Mg Tabs (Prednisone) .... Start 1 tab by mouth two times a day for 1 wk, then 1 tab daily for 1 week, then 1 tab every other day til gone... 16)  Tussionex Pennkinetic Er 8-10 Mg/75ml Lqcr (Chlorpheniramine-hydrocodone) .... Take 1 tsp by mouth every 12 h as needed for cough...  Other Orders: T-2 View CXR (71020TC) Prescription Created Electronically [id number]) Depo- Medrol 80mg  (J1040) Admin of Therapeutic Inj  intramuscular or subcutaneous (47829)  Patient Instructions: 1)  Today we updated your med list- see below.... 2)  Continue the MUCINEX MAX one tab twice daily & lots of fluids! 3)  We decided to treat your airway inflammation w/ Prednisone- take as diected in a tapering schedule... 4)  You may use the TUSSIONEX as needed for cough... 5)  Call for any problems.Marland KitchenMarland Kitchen 6)  Please schedule a follow-up appointment in 3 months, & we will plan a follow up CXR & FASTING blood work at that time... Prescriptions: TUSSIONEX PENNKINETIC ER 8-10 MG/5ML LQCR (CHLORPHENIRAMINE-HYDROCODONE) take 1 tsp by mouth every 12 H as needed for cough...  #4 oz x 2   Entered and Authorized by:   Michele Mcalpine MD   Signed by:   Michele Mcalpine MD on 01/11/2010   Method used:   Print then Give to Patient   RxID:   [id number] PREDNISONE 10 MG TABS (PREDNISONE) start 1 tab by mouth two times a day for 1 wk, then 1 tab daily for 1 week, then 1 tab every other day til gone...  #30 x 0   Entered and Authorized by:   Michele Mcalpine MD   Signed by:   Michele Mcalpine MD on 01/11/2010   Method used:   Print then Give to Patient   RxID:   [id number]     Medication Administration  Injection # 1:    Medication: Depo- Medrol 80mg     Diagnosis: Hx of ASTHMATIC BRONCHITIS, ACUTE (ICD-466.0)    Route: IM    Site: RUOQ gluteus    Exp Date: 07/2012    Lot #: [id number]    Mfr: [employer]    Patient tolerated injection without complications    Given by: Randell Loop CMA (January 11, 2010 3:36  PM)  Orders Added: 1)  T-2 View CXR [71020TC] 2)  Prescription Created Electronically [[id number]] 3)  Est. Patient Level IV [16109] 4)  Depo- Medrol 80mg  [J1040] 5)  Admin of Therapeutic Inj  intramuscular or subcutaneous [60454]

## 2010-10-25 NOTE — Letter (Signed)
Summary: Eye exam/[facility]  Eye exam/[facility]   Imported By: Lester Chincoteague 07/03/2010 07:52:13  _____________________________________________________________________  External Attachment:    Type:   Image     Comment:   External Document

## 2010-10-25 NOTE — Letter (Signed)
Summary: [facility]   Imported By: Lennie Odor 07/19/2010 11:28:08  _____________________________________________________________________  External Attachment:    Type:   Image     Comment:   External Document

## 2010-10-25 NOTE — Assessment & Plan Note (Signed)
Summary: flu shot/apc   Nurse Visit   Allergies: 1)  Sulfa 2)  Biaxin (Clarithromycin) 3)  Codeine 4)  Morphine  Orders Added: 1)  Flu Vaccine 64yrs + MEDICARE PATIENTS [Q2039] 2)  Administration Flu vaccine - MCR [G0008] Flu Vaccine Consent Questions     Do you have a history of severe allergic reactions to this vaccine? no    Any prior history of allergic reactions to egg and/or gelatin? no    Do you have a sensitivity to the preservative Thimersol? no    Do you have a past history of Guillan-Barre Syndrome? no    Do you currently have an acute febrile illness? no    Have you ever had a severe reaction to latex? no    Vaccine information given and explained to patient? yes    Are you currently pregnant? no    Lot Number:[id number]   Exp Date:03/23/2011   Site Given  Left Deltoid IMu  Clarise Cruz [facility])  June 07, 2010 11:47 AM

## 2010-10-25 NOTE — Progress Notes (Signed)
Summary: Xanax Refill  Phone Note Refill Request Message from:  Fax from Pharmacy on March 27, 2010 4:57 PM  Refills Requested: Medication #1:  ALPRAZOLAM 0.25 MG  TABS Take 1 tablet by mouth three times a day as needed for nerves   Dosage confirmed as above?Dosage Confirmed   Brand Name Necessary? No   Supply Requested: 1 month   Last Refilled: 02/08/2010   Notes: Last seen 01/11/2010 [facility]   Method Requested: Fax to Local Pharmacy Next Appointment Scheduled: 05/03/2010 w/ Dr. Kriste Basque Initial call taken by: Michel Bickers CMA,  March 27, 2010 4:59 PM  Follow-up for Phone Call        Formfaxed back to the pharmacy.Michel Bickers CMA  March 27, 2010 4:59 PM    Prescriptions: ALPRAZOLAM 0.25 MG  TABS (ALPRAZOLAM) Take 1 tablet by mouth three times a day as needed for nerves  #90 x 1   Entered by:   Michel Bickers CMA   Authorized by:   Michele Mcalpine MD   Signed by:   Michel Bickers CMA on 03/27/2010   Method used:   Historical   RxID:   [id number]

## 2010-10-25 NOTE — Progress Notes (Signed)
Summary: appt  Phone Note Call from Patient Call back at Home Phone [phone]   Caller: Patient Call For: nadel Reason for Call: Talk to Nurse Summary of Call: cough,sob, not as bad as was but would like to be seen...keeps lingering.  Willing to see SN ot TP Initial call taken by: Eugene Gavia,  January 11, 2010 8:21 AM  Follow-up for Phone Call        called and spoke with pt. pt states she called on 01/05/2010 and was prescribed zpak and told to take mucinex.  pt states she finished zpak and is still taking mucinex.  Pt c/o increased sob esp when coughing.  pt also c/o coughing up clear to white sputum and head congestion with clear nasal drainage.  pt concerned about the increased sob.  Please advise. Thank you.  Aundra Millet Reynolds LPN  January 11, 2010 9:34 AM   allergies: sulfa, biaxin, codeine, morphine  Additional Follow-up for Phone Call Additional follow up Details #1::        called and spoke with pt---she is aware we made her appt today to see SN at 2:30.  she will come early for cxr prior to ov.  pt voiced her understanding. Randell Loop CMA  January 11, 2010 11:12 AM

## 2010-10-25 NOTE — Letter (Signed)
Summary: Ambulatory [facility]  Ambulatory [facility]   Imported By: Sherian Rein 11/03/2009 09:39:34  _____________________________________________________________________  External Attachment:    Type:   Image     Comment:   External Document

## 2010-10-25 NOTE — Progress Notes (Signed)
Summary: ZPAK  Phone Note Call from Patient Call back at Home Phone [phone]   Caller: Patient Call For: nadel Summary of Call: Coughing, scratchy, dry throat, wheezing since Monday night, has taken robitussin not working, would like something called in pls advise.//[facility] Initial call taken by: Darletta Moll,  January 05, 2010 8:34 AM  Follow-up for Phone Call        The patient c/o cough with yellowish mucus x5 days, scratchy throat and audible wheezing. She denies any sob or fever. Please advise.Michel Bickers [facility]  January 05, 2010 8:48 AM  Additional Follow-up for Phone Call Additional follow up Details #1::        per SN---ok for pt to have zpak #1  take as directed and mucinex max otc 1 by mouth two times a day with plenty of fluids.  thanks Randell Loop CMA  January 05, 2010 9:45 AM   The patient is aware of recs and will call if her sxs do not improve or get worse. [facility] [address]. Additional Follow-up by: Michel Bickers CMA,  January 05, 2010 10:00 AM    New/Updated Medications: ZITHROMAX Z-PAK 250 MG TABS (AZITHROMYCIN) Take as directed Prescriptions: ZITHROMAX Z-PAK 250 MG TABS (AZITHROMYCIN) Take as directed  #1 x 0   Entered by:   Michel Bickers CMA   Authorized by:   Michele Mcalpine MD   Signed by:   Michel Bickers CMA on 01/05/2010   Method used:   [facility]        [facility] [facility] [address] [phone]* (retail)       [address]       [place], [place]  [address]       Ph: [phone] or [phone]       Fax: [phone]   RxID:   [id number]

## 2010-10-25 NOTE — Assessment & Plan Note (Signed)
Summary: Acute NP office visit - edema   Primary Provider/Referring Provider:  Kriste Basque  CC:  edema in feet and ankles and calves x10days.  History of Present Illness: 77 y/o WF with known history of DJD    ~  3/09 saw DrCohen at [facility] for f/u of prev corneal transplant & glaucoma laser surg... they sent her to [facility] w/ further surg by DrMuir due to high eye pressures...  ~  6/09 eval and right foot surg by Volney Presser for corn betw the great toe & 2nd toe... prev seen by Podiatry and Dermatology...  ~  she continues to be followed every 61mo by [facility] who does her Cholesterol & Thyroid checks... she tells me that she is doing well on her Crestor & Synthroid...  ~12/26/2008--Presents for follow up. Had left knee replacement -Dr. Ophelia Charter 12/05/08. Discharged rehab  4/2 Still on coumadin has ov Dr Ophelia Charter 4/20. Doing well since back home. Denies chest pain, dyspnea, orthopnea, hemoptysis, fever, n/v/d.   December 08, 2009- Presents for an acute office visit. Complains of edema in feet, ankles and calves x10days. Weight is up 7 lbs since last visit. Right leg is worse w/ redness along mid shin and top of foot, warm to touch. Worse in evening. Previously on higher dose diurectics but renal fnx increased w/ scr at 2.0 , demadex and aldactone decreased , scr decreased 1.8 last check. Denies chest pain, dyspnea, orthopnea, hemoptysis, fever, n/v/d.   Medications Prior to Update: 1)  Timoptic 0.5 % Soln (Timolol Maleate) .[name].. 1 Drop in Right Eye Every 12 Hours 2)  Fluorometholone 0.1 % Susp (Fluorometholone) .[name].. 1 Drop in Right Eye At Bedtime 3)  Cartia Xt 180 Mg  Cp24 (Diltiazem Hcl Coated Beads) .[name].. 1 By Mouth Once Daily 4)  Demadex 20 Mg  Tabs (Torsemide) .[name].. 1 Tab By Mouth Once Daily 5)  Aldactone 25 Mg  Tabs (Spironolactone) .... 1/2 Tab By Mouth Once Daily in The Afternoon... 6)  Crestor 5 Mg Tabs (Rosuvastatin Calcium) .... Take 1 Tablet By Mouth Once A Day 7)  Synthroid 125 Mcg  Tabs  (Levothyroxine Sodium) .... Take 1/2 Tablet By Mouth Once Daily 8)  Prilosec 20 Mg Cpdr (Omeprazole) .... Take 1 Tablet By Mouth Once A Day 9)  Alprazolam 0.25 Mg  Tabs (Alprazolam) .... Take 1 Tablet By Mouth Three Times A Day As Needed For Nerves 10)  Vitamin D 1000 Unit Caps (Cholecalciferol) .... Take 1 Cap By Mouth Once Daily... 11)  Mobic 7.5 Mg Tabs (Meloxicam) .... Take 1 Tab By Mouth Once Daily As Needed For Arthritis Pain...  Current Medications (verified): 1)  Timoptic 0.5 % Soln (Timolol Maleate) .[name].. 1 Drop in Each Eye Every 12 Hours 2)  Fluorometholone 0.1 % Susp (Fluorometholone) .[name].. 1 Drop in Right Eye At Bedtime 3)  Cartia Xt 180 Mg  Cp24 (Diltiazem Hcl Coated Beads) .[name].. 1 By Mouth Once Daily 4)  Demadex 20 Mg  Tabs (Torsemide) .[name].. 1 Tab By Mouth Once Daily 5)  Aldactone 25 Mg  Tabs (Spironolactone) .... 1/2 Tab By Mouth Once Daily in The Afternoon... 6)  Crestor 5 Mg Tabs (Rosuvastatin Calcium) .... Take 1 Tablet By Mouth Once A Day 7)  Synthroid 125 Mcg  Tabs (Levothyroxine Sodium) .... Take 1/2 Tablet By Mouth Once Daily 8)  Prilosec 20 Mg Cpdr (Omeprazole) .... Take 1 Tablet By Mouth Once A Day 9)  Alprazolam 0.25 Mg  Tabs (Alprazolam) .... Take 1 Tablet By Mouth Three Times  A Day As Needed For Nerves 10)  Vitamin D 1000 Unit Caps (Cholecalciferol) .... Take 1 Cap By Mouth Once Daily... 11)  Mobic 7.5 Mg Tabs (Meloxicam) .... Take 1 Tab By Mouth Once Daily As Needed For Arthritis Pain... 12)  Vitamin C 500 Mg Tabs (Ascorbic Acid) .... Take 1 Tablet By Mouth Once A Day 13)  Aspirin 81 Mg Tbec (Aspirin) .... Take 1 Tablet By Mouth Once A Day 14)  Simvastatin 20 Mg Tabs (Simvastatin) .... Take 1 Tab By Mouth At Bedtime  Allergies (verified): 1)  Sulfa 2)  Biaxin (Clarithromycin) 3)  Codeine 4)  Morphine  Past History:  Past Medical History: Last updated: 07/27/2009 GLAUCOMA (ICD-365.9) Hx of ASTHMATIC BRONCHITIS, ACUTE (ICD-466.0) HYPERTENSION  (ICD-401.9) PALPITATIONS, HX OF (ICD-V12.50) VENOUS INSUFFICIENCY (ICD-459.81) HYPERCHOLESTEROLEMIA (ICD-272.0) HYPOTHYROIDISM (ICD-244.9) GERD (ICD-530.81) DIVERTICULOSIS OF COLON (ICD-562.10) COLONIC POLYPS (ICD-211.3) RENAL INSUFFICIENCY (ICD-588.9) DEGENERATIVE JOINT DISEASE (ICD-715.90) VITAMIN D DEFICIENCY (ICD-268.9) ANXIETY (ICD-300.00)  Past Surgical History: Last updated: 07/27/2009 S/P cataract surg S/P Hysterectomy S/P benign breast biopsy S/P right total knee replacement in 1999 S/P left TKR 3/10 by DrYates S/P right second toe part amputation by Volney Presser 2010  Family History: Last updated: May 16, 2008 mother deceased age 35 from heart problems father deceased age 82 from heart problems  hx of throat cancer 1 sibling alive age 62  hx of DM 1 sibling deceased from cancer  Social History: Last updated: 11/07/2008 never smoked no etoh widowed 1 daughter -  hx of DM  Risk Factors: Smoking Status: never (11/07/2008)  Review of Systems      See HPI  Vital Signs:  Patient profile:   77 year old female Height:      63 inches Weight:      167.13 pounds O2 Sat:      96 % on Room air Temp:     97.7 degrees F oral Pulse rate:   93 / minute BP sitting:   140 / 82  (left arm) Cuff size:   regular  Vitals Entered By: Boone Master CNA (December 08, 2009 11:38 AM)  O2 Flow:  Room air CC: edema in feet, ankles and calves x10days Comments Medications reviewed with patient Daytime contact number verified with patient. Boone Master CNA  December 08, 2009 11:39 AM    Physical Exam  Additional Exam:  WD, WN, 77 y/o WF in NAD... GENERAL:  Alert & oriented; pleasant & cooperative... HEENT:  [place]/AT,  EACs-clear, TMs-wnl, NOSE-clear, THROAT-clear & wnl. NECK:  Supple w/ fairROM; no JVD; normal carotid impulses w/o bruits; no thyromegaly or nodules palpated; no lymphadenopathy. CHEST:  Clear to P & A; without wheezes/ rales/ or rhonchi heard... HEART:  Regular Rhythm;   gr 1/6 SEM without rubs or gallops detected... ABDOMEN:  Soft & nontender; normal bowel sounds; no organomegaly or masses detected. EXT:  moderate arthritic changes; mild varicose veins/ +venous insuffic/ 1-2+edema right > left,  NEURO:  CN's intact;  no focal neuro deficits... DERM:  skin red along mid skin and dorsal aspect of foot, stasis dermatatic changes.      Impression & Recommendations:  Problem # 1:  VENOUS INSUFFICIENCY (ICD-459.81)  Flare with ? underlying cellulitis  REC:  Increase Demadex 20mg  2 tabs once daily 2 days  Increase Aldactone 25mg  1 tab (whole tab) once daily for 2 days Then resume normal doses of Demadex and Aldactone.  Keep legs elevated  Warm moist compresses to lower legs as needed  Keflex 500mg  four times a day for 7  days follow up 2 weeks and as needed  Please contact office for sooner follow up if symptoms do not improve or worsen   Orders: Est. Patient Level IV (14782) Prescription Created Electronically [phone])  Medications Added to Medication List This Visit: 1)  Timoptic 0.5 % Soln (Timolol maleate) .[name].. 1 drop in each eye every 12 hours 2)  Crestor 5 Mg Tabs (Rosuvastatin calcium) .... Take 1 tablet by mouth once a day 3)  Vitamin C 500 Mg Tabs (Ascorbic acid) .... Take 1 tablet by mouth once a day 4)  Aspirin 81 Mg Tbec (Aspirin) .... Take 1 tablet by mouth once a day 5)  Simvastatin 20 Mg Tabs (Simvastatin) .... Take 1 tab by mouth at bedtime 6)  Keflex 500 Mg Caps (Cephalexin) .[name].. 1 by mouth four times a day  Complete Medication List: 1)  Timoptic 0.5 % Soln (Timolol maleate) .[name].. 1 drop in each eye every 12 hours 2)  Fluorometholone 0.1 % Susp (Fluorometholone) .[name].. 1 drop in right eye at bedtime 3)  Cartia Xt 180 Mg Cp24 (Diltiazem hcl coated beads) .[name].. 1 by mouth once daily 4)  Demadex 20 Mg Tabs (Torsemide) .[name].. 1 tab by mouth once daily 5)  Aldactone 25 Mg Tabs (Spironolactone) .... 1/2 tab by mouth once daily in the  afternoon... 6)  Crestor 5 Mg Tabs (Rosuvastatin calcium) .... Take 1 tablet by mouth once a day 7)  Synthroid 125 Mcg Tabs (Levothyroxine sodium) .... Take 1/2 tablet by mouth once daily 8)  Prilosec 20 Mg Cpdr (Omeprazole) .... Take 1 tablet by mouth once a day 9)  Alprazolam 0.25 Mg Tabs (Alprazolam) .... Take 1 tablet by mouth three times a day as needed for nerves 10)  Vitamin D 1000 Unit Caps (Cholecalciferol) .... Take 1 cap by mouth once daily... 11)  Mobic 7.5 Mg Tabs (Meloxicam) .... Take 1 tab by mouth once daily as needed for arthritis pain... 12)  Vitamin C 500 Mg Tabs (Ascorbic acid) .... Take 1 tablet by mouth once a day 13)  Aspirin 81 Mg Tbec (Aspirin) .... Take 1 tablet by mouth once a day 14)  Simvastatin 20 Mg Tabs (Simvastatin) .... Take 1 tab by mouth at bedtime 15)  Keflex 500 Mg Caps (Cephalexin) .[name].. 1 by mouth four times a day  Patient Instructions: 1)  Increase Demadex 20mg  2 tabs once daily 2 days  2)  Increase Aldactone 25mg  1 tab (whole tab) once daily for 2 days 3)  Then resume normal doses of Demadex and Aldactone.  4)  Keep legs elevated  5)  Warm moist compresses to lower legs as needed  6)  Keflex 500mg  four times a day for 7 days 7)  follow up 2 weeks and as needed  8)  Please contact office for sooner follow up if symptoms do not improve or worsen  Prescriptions: KEFLEX 500 MG CAPS (CEPHALEXIN) 1 by mouth four times a day  #28 x 0   Entered and Authorized by:   Rubye Oaks NP   Signed by:   Khadeeja Elden NP on 12/08/2009   Method used:   Electronically to        [facility] [facility] [address] [phone]* (retail)       [address]       [place], [place]  [address]       Ph: [phone] or [phone]       Fax: [phone]   RxID:  [id number]    

## 2010-10-25 NOTE — Progress Notes (Signed)
Summary: results and appointment  Phone Note Call from Patient   Caller: Patient Call For: dr Kriste Basque Summary of Call: Patient was in [facility] Monday night for severe pain. She stated that they did CT scan of stomach and pelvic and blood work. They told the patient to advise Dr. Kriste Basque that she was in the hospital and that on the scan they saw something but she doesn't know what and she stated that they told her they couldnt tell if it was on her ovary or kidney. She wants to know if she should be seen sooner than her 11/01/10 appointment. She is now having dark stools since yesterday. Her bowels moved about 4 times when she was in the hospital. Patient can be reached at [phone]  Follow-up for Phone Call        called and spoke with pt and she is aware of appt on 1-23 at 3:30 to review the results of the ct scan done at er---pt states that her upper abd still has some pain but is much better---she stated that her stools are still dark.  pt will come for appt monday Randell Loop [facility]  October 11, 2010 3:37 PM

## 2010-10-25 NOTE — Progress Notes (Signed)
Summary: swollen foot-line busy  Phone Note Call from Patient   Caller: Patient Call For: Dasan Hardman Summary of Call: right foot swollen would like ov asap Initial call taken by: Rickard Patience,  December 04, 2009 9:13 AM  Follow-up for Phone Call        Pt c/o increased swelling in her right foot as well as her left foot. pt states she increased her torsemide 20mg  to twice daily as well as her spironolactone from 25mg  1/2 tab to one whole tab daily. Pt states she increasd her meds because she could not get a shoe on yesterday on her right foot. Pt states she has had trouble with her right foot in the past. Should she continue her meds at increased dose?   Please advise. Carron Curie CMA  December 04, 2009 9:43 AM   Additional Follow-up for Phone Call Additional follow up Details #1::        per SN----no she will need to go back to the previous dose---elevate leg, keep legs up and no salt---needs appt with TP this week please.  thanks Randell Loop CMA  December 04, 2009 4:07 PM   ATC line busy, wcb. Carron Curie CMA  December 04, 2009 4:21 PM  ATC line busy, wcb. Carron Curie CMA  December 04, 2009 4:28 PM      Additional Follow-up for Phone Call Additional follow up Details #2::    pt advised and scheduled for an appt to see TP on 12/08/09 at 11:30. pt aware. Carron Curie CMA  December 04, 2009 4:59 PM

## 2010-10-25 NOTE — Progress Notes (Signed)
Summary: cough  Phone Note Call from Patient Call back at [facility] Phone [phone]   Caller: Patient Call For: nadel Summary of Call: pt c/o dry cough x 5 days. thick yellow mucus when she is able to cough mucus up. no fever/ no N or V. has taken mucinex and claritin. pls advise. [facility] [address] Initial call taken by: Tivis Ringer, CNA,  May 11, 2010 12:25 PM  Follow-up for Phone Call        Pt recently seen 05-03-10;please advise.   ALLERGIES:Biaxin,sufla,codeine,morphine.  Reynaldo Minium CMA  May 11, 2010 12:40 PM   Additional Follow-up for Phone Call Additional follow up Details #1::        per SN----zpak #1  and use the mucinex otc  600mg   2 by mouth two times a day with plenty of fluids.  thanks Randell Loop CMA  May 14, 2010 10:38 AM     Additional Follow-up for Phone Call Additional follow up Details #2::    called and spoke with pt.   Pt states she ended up going to [facility] over the weekend on Saturday and was dx with "bronchial pna." Therefore, no need for [place] to call in the zpak.  she is already on meds that UC prescribed.   Will forward message to SN as an FYI.  Aundra Millet Reynolds LPN  May 14, 2010 10:43 AM

## 2010-10-25 NOTE — Assessment & Plan Note (Signed)
Summary: Acute NP office visit - shingles   Primary Provider/Referring Provider:  Kriste Basque  CC:  pain under right breast radiating to the back beginning Monday with rash appearing Wednesday.  History of Present Illness: 77 y/o WF with known history of DJD    ~  3/09 saw DrCohen at [facility] eye clinic for f/u of prev corneal transplant & glaucoma laser surg... they sent her to [facility] w/ further surg by DrMuir due to high eye pressures...  ~  6/09 eval and right foot surg by Volney Presser for corn betw the great toe & 2nd toe... prev seen by Podiatry and Dermatology...  ~  she continues to be followed every 58mo by [facility] who does her Cholesterol & Thyroid checks... she tells me that she is doing well on her Crestor & Synthroid...  ~12/26/2008--Presents for follow up. Had left knee replacement -Dr. Ophelia Charter 12/05/08. Discharged rehab  4/2 Still on coumadin has ov Dr Ophelia Charter 4/20. Doing well since back home. Denies chest pain, dyspnea, orthopnea, hemoptysis, fever, n/v/d.   December 08, 2009- Presents for an acute office visit. Complains of edema in feet, ankles and calves x10days. Weight is up 7 lbs since last visit. Right leg is worse w/ redness along mid shin and top of foot, warm to touch. Worse in evening. Previously on higher dose diurectics but renal fnx increased w/ scr at 2.0 , demadex and aldactone decreased , scr decreased 1.8 last check. Denies chest pain, dyspnea, orthopnea, hemoptysis, fever, n/v/d.   April 06, 2010--Presents for an acute office visit. Complains of pain under right breast radiating to the back beginning Monday with rash appearing Wednesday1 day ago. Skin is very sensitive. Painful at times. Feels like a burning pain. Denies chest pain, dyspnea, orthopnea, hemoptysis, fever, n/v/d, edema, headache.    Medications Prior to Update: 1)  Timoptic 0.5 % Soln (Timolol Maleate) .[name].. 1 Drop in Each Eye Every 12 Hours 2)  Fluorometholone 0.1 % Susp (Fluorometholone) .[name].. 1 Drop in Right Eye At  Bedtime 3)  Aspirin 81 Mg Tbec (Aspirin) .... Take 1 Tablet By Mouth Once A Day 4)  Cartia Xt 180 Mg  Cp24 (Diltiazem Hcl Coated Beads) .[name].. 1 By Mouth Once Daily 5)  Demadex 20 Mg  Tabs (Torsemide) .[name].. 1 Tab By Mouth Once Daily 6)  Aldactone 25 Mg  Tabs (Spironolactone) .... 1/2 Tab By Mouth Once Daily in The Afternoon... 7)  Simvastatin 20 Mg Tabs (Simvastatin) .... Take 1 Tab By Mouth At Bedtime 8)  Synthroid 125 Mcg  Tabs (Levothyroxine Sodium) .... Take 1/2 Tablet By Mouth Once Daily 9)  Prilosec 20 Mg Cpdr (Omeprazole) .... Take 1 Tablet By Mouth Once A Day 10)  Mobic 7.5 Mg Tabs (Meloxicam) .... Take 1 Tab By Mouth Once Daily As Needed For Arthritis Pain... 11)  Alprazolam 0.25 Mg  Tabs (Alprazolam) .... Take 1 Tablet By Mouth Three Times A Day As Needed For Nerves 12)  Vitamin D 1000 Unit Caps (Cholecalciferol) .... Take 1 Cap By Mouth Once Daily... 13)  Vitamin C 500 Mg Tabs (Ascorbic Acid) .... Take 1 Tablet By Mouth Once A Day 14)  Mucinex 600 Mg Xr12h-Tab (Guaifenesin) .... Take One Tablet By Mouth Two Times A Day 15)  Prednisone 10 Mg Tabs (Prednisone) .... Start 1 Tab By Mouth Two Times A Day For 1 Wk, Then 1 Tab Daily For 1 Week, Then 1 Tab Every Other Day Til Gone... 16)  Tussionex Pennkinetic Er 8-10 Mg/61ml Lqcr (Chlorpheniramine-Hydrocodone) .... Take 1  Tsp By Mouth Every 12 H As Needed For Cough...  Current Medications (verified): 1)  Timoptic 0.5 % Soln (Timolol Maleate) .[name].. 1 Drop in Each Eye Every 12 Hours 2)  Fluorometholone 0.1 % Susp (Fluorometholone) .[name].. 1 Drop in Right Eye At Bedtime 3)  Aspirin 81 Mg Tbec (Aspirin) .... Take 1 Tablet By Mouth Once A Day 4)  Cartia Xt 180 Mg  Cp24 (Diltiazem Hcl Coated Beads) .[name].. 1 By Mouth Once Daily 5)  Demadex 20 Mg  Tabs (Torsemide) .[name].. 1 Tab By Mouth Once Daily 6)  Aldactone 25 Mg  Tabs (Spironolactone) .... 1/2 Tab By Mouth Once Daily in The Afternoon... 7)  Simvastatin 20 Mg Tabs (Simvastatin) .... Take 1 Tab By Mouth At  Bedtime 8)  Synthroid 125 Mcg  Tabs (Levothyroxine Sodium) .... Take 1/2 Tablet By Mouth Once Daily 9)  Prilosec 20 Mg Cpdr (Omeprazole) .... Take 1 Tablet By Mouth Once A Day 10)  Mobic 7.5 Mg Tabs (Meloxicam) .... Take 1 Tab By Mouth Once Daily As Needed For Arthritis Pain... 11)  Alprazolam 0.25 Mg  Tabs (Alprazolam) .... Take 1 Tablet By Mouth Three Times A Day As Needed For Nerves 12)  Vitamin D 1000 Unit Caps (Cholecalciferol) .... Take 1 Cap By Mouth Once Daily... 13)  Vitamin C 500 Mg Tabs (Ascorbic Acid) .... Take 1 Tablet By Mouth Once A Day 14)  Mucinex 600 Mg Xr12h-Tab (Guaifenesin) .... Take One Tablet By Mouth Two Times A Day 15)  Tussionex Pennkinetic Er 8-10 Mg/34ml Lqcr (Chlorpheniramine-Hydrocodone) .... Take 1 Tsp By Mouth Every 12 H As Needed For Cough...  Allergies (verified): 1)  Sulfa 2)  Biaxin (Clarithromycin) 3)  Codeine 4)  Morphine  Past History:  Past Medical History: Last updated: 01/11/2010 GLAUCOMA (ICD-365.9) Hx of ASTHMATIC BRONCHITIS, ACUTE (ICD-466.0) HYPERTENSION (ICD-401.9) PALPITATIONS, HX OF (ICD-V12.50) VENOUS INSUFFICIENCY (ICD-459.81) HYPERCHOLESTEROLEMIA (ICD-272.0) HYPOTHYROIDISM (ICD-244.9) GERD (ICD-530.81) DIVERTICULOSIS OF COLON (ICD-562.10) COLONIC POLYPS (ICD-211.3) RENAL INSUFFICIENCY (ICD-588.9) DEGENERATIVE JOINT DISEASE (ICD-715.90) VITAMIN D DEFICIENCY (ICD-268.9) ANXIETY (ICD-300.00)  Past Surgical History: Last updated: 01/11/2010 S/P cataract surg S/P Hysterectomy S/P benign breast biopsy S/P right total knee replacement in 1999 S/P left TKR 3/10 by DrYates S/P right second toe part amputation by Volney Presser 2010  Family History: Last updated: 2008/05/21 mother deceased age 50 from heart problems father deceased age 48 from heart problems  hx of throat cancer 1 sibling alive age 67  hx of DM 1 sibling deceased from cancer  Social History: Last updated: 11/07/2008 never smoked no etoh widowed 1 daughter -   hx of DM  Risk Factors: Smoking Status: never (11/07/2008)  Review of Systems      See HPI  Vital Signs:  Patient profile:   77 year old female Height:      63 inches Weight:      162.13 pounds BMI:     28.82 O2 Sat:      97 % on Room air Temp:     99.8 degrees F oral Pulse rate:   88 / minute BP sitting:   136 / 78  (right arm) Cuff size:   regular  Vitals Entered By: Boone Master CNA/MA (April 06, 2010 11:13 AM)  O2 Flow:  Room air CC: pain under right breast radiating to the back beginning Monday with rash appearing Wednesday Is Patient Diabetic? No Comments Medications reviewed with patient Daytime contact number verified with patient. Boone Master CNA/MA  April 06, 2010 11:14 AM    Physical Exam  Additional Exam:  WD, WN, 77 y/o WF in NAD... GENERAL:  Alert & oriented; pleasant & cooperative... HEENT:  [place]/AT,  EACs-clear, TMs-wnl, NOSE-clear, THROAT-clear & wnl. NECK:  Supple w/ fairROM; no JVD; normal carotid impulses w/o bruits; no thyromegaly or nodules palpated; no lymphadenopathy. CHEST:  Clear to P & A; without wheezes/ rales/ or rhonchi heard... HEART:  Regular Rhythm;  gr 1/6 SEM without rubs or gallops detected... ABDOMEN:  Soft & nontender; normal bowel sounds; no organomegaly or masses detected. EXT:  moderate arthritic changes; mild varicose veins/ +venous insuffic/ 1-2+edema right > left,  NEURO:  CN's intact;  no focal neuro deficits... DERM:  scattered blisters along right post thoracic/side.     Impression & Recommendations:  Problem # 1:  SHINGLES (ICD-053.9)  Valtrex 1gram three times a day for 7 days Tylenol as needed pain  Vicodin 1/2 every 8 hr as needed severe pain --may make you sleepy.  Please contact office for sooner follow up if symptoms do not improve or worsen  Once the rash has crusted over, may use capsacin cream over the counter for pain.   Orders: Est. Patient Level IV (16109)  Medications Added to Medication List This  Visit: 1)  Valtrex 1 Gm Tabs (Valacyclovir hcl) .[name].. 1 by mouth three times a day 2)  Vicodin 5-500 Mg Tabs (Hydrocodone-acetaminophen) .... 1/2 by mouth every 8 hr as needed pain, may make you sleepy.  Complete Medication List: 1)  Timoptic 0.5 % Soln (Timolol maleate) .[name].. 1 drop in each eye every 12 hours 2)  Fluorometholone 0.1 % Susp (Fluorometholone) .[name].. 1 drop in right eye at bedtime 3)  Aspirin 81 Mg Tbec (Aspirin) .... Take 1 tablet by mouth once a day 4)  Cartia Xt 180 Mg Cp24 (Diltiazem hcl coated beads) .[name].. 1 by mouth once daily 5)  Demadex 20 Mg Tabs (Torsemide) .[name].. 1 tab by mouth once daily 6)  Aldactone 25 Mg Tabs (Spironolactone) .... 1/2 tab by mouth once daily in the afternoon... 7)  Simvastatin 20 Mg Tabs (Simvastatin) .... Take 1 tab by mouth at bedtime 8)  Synthroid 125 Mcg Tabs (Levothyroxine sodium) .... Take 1/2 tablet by mouth once daily 9)  Prilosec 20 Mg Cpdr (Omeprazole) .... Take 1 tablet by mouth once a day 10)  Mobic 7.5 Mg Tabs (Meloxicam) .... Take 1 tab by mouth once daily as needed for arthritis pain... 11)  Alprazolam 0.25 Mg Tabs (Alprazolam) .... Take 1 tablet by mouth three times a day as needed for nerves 12)  Vitamin D 1000 Unit Caps (Cholecalciferol) .... Take 1 cap by mouth once daily... 13)  Vitamin C 500 Mg Tabs (Ascorbic acid) .... Take 1 tablet by mouth once a day 14)  Mucinex 600 Mg Xr12h-tab (Guaifenesin) .... Take one tablet by mouth two times a day 15)  Tussionex Pennkinetic Er 8-10 Mg/25ml Lqcr (Chlorpheniramine-hydrocodone) .... Take 1 tsp by mouth every 12 h as needed for cough... 16)  Valtrex 1 Gm Tabs (Valacyclovir hcl) .[name].. 1 by mouth three times a day 17)  Vicodin 5-500 Mg Tabs (Hydrocodone-acetaminophen) .... 1/2 by mouth every 8 hr as needed pain, may make you sleepy.  Patient Instructions: 1)  Valtrex 1gram three times a day for 7 days 2)  Tylenol as needed pain  3)  Vicodin 1/2 every 8 hr as needed severe pain --may make you  sleepy.  4)  Please contact office for sooner follow up if symptoms do not improve or worsen  5)  Once the rash has crusted over, may use capsacin cream over the counter for pain.  Prescriptions: VICODIN 5-500 MG TABS (HYDROCODONE-ACETAMINOPHEN) 1/2 by mouth every 8 hr as needed pain, may make you sleepy.  #15 x 0   Entered and Authorized by:   Rubye Oaks NP   Signed by:   Fenix Ruppe NP on 04/06/2010   Method used:   Print then Give to Patient   RxID:   [id number] VALTREX 1 GM TABS (VALACYCLOVIR HCL) 1 by mouth three times a day  #21 x 0   Entered and Authorized by:   Rubye Oaks NP   Signed by:   Loralei Radcliffe NP on 04/06/2010   Method used:   Electronically to        [facility] [address] [phone]* (retail)       [name]       [place], [place]  [address]       Ph: [phone] or [phone]       Fax: [phone]   RxID:   [id number]

## 2010-10-26 ENCOUNTER — Telehealth: Payer: Self-pay | Admitting: Pulmonary Disease

## 2010-10-30 ENCOUNTER — Encounter: Payer: Self-pay | Admitting: Pulmonary Disease

## 2010-10-31 ENCOUNTER — Ambulatory Visit (INDEPENDENT_AMBULATORY_CARE_PROVIDER_SITE_OTHER): Payer: Medicare Other | Admitting: Adult Health

## 2010-10-31 ENCOUNTER — Other Ambulatory Visit: Payer: Self-pay | Admitting: Adult Health

## 2010-10-31 ENCOUNTER — Other Ambulatory Visit: Payer: Medicare Other

## 2010-10-31 ENCOUNTER — Encounter: Payer: Self-pay | Admitting: Adult Health

## 2010-10-31 DIAGNOSIS — R609 Edema, unspecified: Secondary | ICD-10-CM

## 2010-10-31 DIAGNOSIS — I872 Venous insufficiency (chronic) (peripheral): Secondary | ICD-10-CM

## 2010-10-31 LAB — BASIC METABOLIC PANEL
BUN: 48 mg/dL — ABNORMAL HIGH (ref 6–23)
CO2: 32 mEq/L (ref 19–32)
Chloride: 98 mEq/L (ref 96–112)
Creatinine, Ser: 1.9 mg/dL — ABNORMAL HIGH (ref 0.4–1.2)

## 2010-10-31 NOTE — Assessment & Plan Note (Signed)
Summary: reveiw results of ct scan from ER/abd pain/la   Primary Care Provider:  Kriste Basque  CC:  5 month ROV & post- ER check....  History of Present Illness: 77 y/o WF here for a follow up visit... she has mult med problems as noted below...   ~  January 11, 2010:  called c/o cough, congestion, yellow phlegm & incr SOB... no f/c/s, no CP, etc... she took ZPak & phlegm cleared but still congested, wheezing, coughing, SOB> exam w/ exp wheezing & rhonchi; CXR w/ incr markings (?vague nodular opac on left)> we decided to Rx w/ Depo/ Pred, Mucinex/ Fluids, Tussionex Prn, & ROV recheck... Otherw BP= OK;  Chol Rx ch by DrGegick to Zocor20;  Thyroid stable on 11mcg/d;  using Alpraz 0.25 to sleep at night...   ~  May 03, 2010:  she had Shingles right T9-10 distrib> resolved after Valtrex, Vicodin Rx... c/o pain right hip area w/ shot DrYates- no benefit;  then eval DrNewton w/ epid steroid shot- only sl better... BP remains controlled on meds & fluid status stable;  Chol looks good on Simva20 per DrGegick, & also on Synthroid 1/2 of the dose... she wants f/u labs here today...   ~  October 15, 2010:  she went to ER 1/17 w/ epig pain x several hrs- resolved spont & no recurrence... they did CTAbd (bilat renal cysts & bilat adnexal lesions- left is larger than prev> prev eval/ followed by DrMcPhail); and labs w/ BUN=56, Creat=2.67... given Flafyl & told to f/u here> we will decr her diuretic Rx to Demadex 20mg /d (stop Aldactone) & reinforce no salt, elevation, support hose to rx edema...    Current Problem List:  GLAUCOMA (ICD-365.9) - hx mult eye problems w/ prev corneal transplant & glaucoma laser eye surg... on eye drops per Ophthalmology at [facility]...  ASTHMATIC BRONCHITIS, ACUTE (ICD-466.0) - Hx AB requiring Rx w/ Depo/ Pred/ Mucinex/ Tussionex...  ~  CXR 4/11 showed vague nodular densities on left...  ~  8/11:  she went to an [facility] w/ cough, sputum, & dx w/ pneumonia rx w/  ZPak...  HYPERTENSION (ICD-401.9) - controlled on ASA 81mg /d,  CARTIAXT 180mg /d, DEMEDEX 20mg /d and ALDACTONE 25mg - 1/2 daily... BP= 124/70 and she is tol meds well... denies HA, fatigue, visual changes, CP, palipit, dizziness, syncope, dyspnea, etc... sl incr edema noted...  ~  7/10:  labs showed BUN=54, Creat=2.0 & rec to decr diuretics in half= one Demedex & 1/2 Aldactone.  ~  11/10:  labs showed BUN=38, Creat=1.8 & rec to keep same meds.  ~  8/11:  labs showed BUN=42, Creat=2.1, K=4.3  ~  1/12:  labs in ER showed BUN=56, Creat-2.67, therefore diuretics decr to Demadex20/d only.  PALPITATIONS, HX OF (ICD-V12.50)  VENOUS INSUFFICIENCY (ICD-459.81) - swelling controlled w/ diuretics, low sodium diet, elevation, support hose, etc...  HYPERCHOLESTEROLEMIA (ICD-272.0) - prev Rx'd by DrGegick... she tells me he switched her to ZOCOR 20mg /d from the prev Crestor5 & Lescol that she has been on for years... she was intol to Lipitor due to muscle aching... she says she has to battle her insurance company for meds that DrG wants.  ~  she brought labs from DrG 5/10 (off med due to $$$) w/ TChol 285, TG 204, HDL 69, LDL 195... "he was mad at me"  ~  FLP 5/11 from [facility] showed TChol 202, TG 60, HDL 112, LDL 99  HYPOTHYROIDISM (ICD-244.9) - prev followed by DrGegick on SYNTHROID - 1/2 daily...  ~  pt brought labs from [facility] 5/10 w/ TSH= 2.90, FreeT4= 1.09  ~  labs 8/11 showed TSH= 1.87  GERD (ICD-530.81) - uses PRILOSEC 20mg /d... last EGD was 6/06 by DrPatterson showing 3cmHH, reflux...   DIVERTICULOSIS OF COLON (ICD-562.10) & COLONIC POLYPS (ICD-211.3) - last colonoscopy 6/06 was WNL- no abnormality seen...  RENAL INSUFFICIENCY (ICD-588.9)  ~  labs 10/08 showed BUN= 27, Creat= 1.5  ~  labs 8/09 showed BUN= 44, Creat= 1.9  ~  labs 2/10 showed BUN 46, Creat= 1.7  ~  labs 7/10 showed BUN= 54, Creat= 2.0  ~  labs 11/10 showed BUN= 38, Creat= 1.8  ~  labs 8/11 showed BUN= 42, Creat= 2.1  ~   labs in ER 1/12 showed BUN=56, Creat=2.67, rec decr diuretics to Demadex20mg /d only.  DEGENERATIVE JOINT DISEASE (ICD-715.90) - this is her chief complaint- s/p right TKR in 1999,  left TKR 3/10... on MOBIC 7.5mg  Prn & VICODIN Prn as well... followed by DrYates; and had right second toe amp by Volney Presser 2010 for hammertoe + spurs... also had epidural steroid shot from [facility] for LBP...  VITAMIN D DEFICIENCY (ICD-268.9)  ~  labs 8/09 showed Vit D level = 12... rec- start Vit D 50,000 u weekly...  ~  labs 2/10 showed Vit D level = 30... rec- continue Vit D 50K weekly...  ~  labs 7/10 showed Vit D level = 39... rec- change to 1000 u OTC daily.  ~  labs 8/11 showed Vit D level = 38... continue same.  ANXIETY (ICD-300.00) - on ALPRAZOLAM 0.25mg Tid Prn... several deaths in the family... daughter who lives w/ her recently ill...  SHINGLES - she had right T9-10 shingles in 2011 & resolved w/ Valtrex, Pred, etc...    Preventive Screening-Counseling & Management  Alcohol-Tobacco     Smoking Status: never  Allergies: 1)  Sulfa 2)  Biaxin (Clarithromycin) 3)  Codeine 4)  Morphine  Comments:  Nurse/Medical Assistant: The patient's medications and allergies were reviewed with the patient and were updated in the Medication and Allergy Lists.  Past History:  Past Medical History: GLAUCOMA (ICD-365.9) Hx of ASTHMATIC BRONCHITIS, ACUTE (ICD-466.0) HYPERTENSION (ICD-401.9) PALPITATIONS, HX OF (ICD-V12.50) VENOUS INSUFFICIENCY (ICD-459.81) HYPERCHOLESTEROLEMIA (ICD-272.0) HYPOTHYROIDISM (ICD-244.9) GERD (ICD-530.81) DIVERTICULOSIS OF COLON (ICD-562.10) COLONIC POLYPS (ICD-211.3) RENAL INSUFFICIENCY (ICD-588.9) DEGENERATIVE JOINT DISEASE (ICD-715.90) VITAMIN D DEFICIENCY (ICD-268.9) ANXIETY (ICD-300.00)  Past Surgical History: S/P cataract surg S/P Hysterectomy S/P benign breast biopsy S/P right total knee replacement in 1999 S/P left TKR 3/10 by DrYates S/P right second toe part  amputation by Volney Presser 2010  Family History: Reviewed history from 05/09/2008 and no changes required. mother deceased age 71 from heart problems father deceased age 68 from heart problems  hx of throat cancer 1 sibling alive age 70  hx of DM 1 sibling deceased from cancer  Social History: Reviewed history from 05/03/2010 and no changes required. never smoked no etoh widowed 1 daughter -  hx of DM  Review of Systems      See HPI       The patient complains of dyspnea on exertion, peripheral edema, muscle weakness, and difficulty walking.  The patient denies anorexia, fever, weight loss, weight gain, vision loss, decreased hearing, hoarseness, chest pain, syncope, prolonged cough, headaches, hemoptysis, abdominal pain, melena, hematochezia, severe indigestion/heartburn, hematuria, incontinence, genital sores, suspicious skin lesions, transient blindness, depression, unusual weight change, abnormal bleeding, enlarged lymph nodes, and angioedema.    Vital Signs:  Patient profile:   77 year old female Height:  63 inches Weight:      158.50 pounds BMI:     28.18 O2 Sat:      95 % on Room air Temp:     98.0 degrees F oral Pulse rate:   88 / minute BP sitting:   124 / 70  (right arm) Cuff size:   regular  Vitals Entered By: Randell Loop CMA (October 15, 2010 3:45 PM)  O2 Sat at Rest %:  95 O2 Flow:  Room air CC: 5 month ROV & post- ER check... Is Patient Diabetic? No Pain Assessment Patient in pain? no      Comments meds updated today with pt   Physical Exam  Additional Exam:  WD, WN, 77 y/o WF in NAD... GENERAL:  Alert & oriented; pleasant & cooperative... HEENT:  [place]/AT, EOM- full, EACs-clear, TMs-wnl, NOSE-clear, THROAT-clear & wnl. NECK:  Supple w/ fairROM; no JVD; normal carotid impulses w/o bruits; no thyromegaly or nodules palpated; no lymphadenopathy. CHEST:  Clear, decr BS bilat w/o wheezing, rales, or rhonchi heard... HEART:  Regular Rhythm;  gr 1/6 SEM  without rubs or gallops detected... ABDOMEN:  Soft & nontender; normal bowel sounds; no organomegaly or masses detected. EXT:  moderate arthritic changes; s/p bilat TKRs; mild varicose veins/ +venous insuffic/ 1+edema. s/p right second toe distal amputation... NEURO:  CN's intact;  no focal neuro deficits... DERM:  No lesions noted; no rash etc...    MISC. Report  Procedure date:  10/15/2010  Findings:      Reviewed ER records & labs from 10/09/10...  SN   Impression & Recommendations:  Problem # 1:  Hx of ASTHMATIC BRONCHITIS, ACUTE (ICD-466.0) Stable & we will f/u CXR on return visit... Her updated medication list for this problem includes:    Mucinex 600 Mg Xr12h-tab (Guaifenesin) .[name]... Take one tablet by mouth two times a day  Problem # 2:  HYPERTENSION (ICD-401.9) Controlled>  same meds but decr diuretics to Demedex 20mg  Qam... The following medications were removed from the medication list:    Aldactone 25 Mg Tabs (Spironolactone) .[name]... 1/2 tab by mouth once daily in the afternoon... Her updated medication list for this problem includes:    Cartia Xt 180 Mg Cp24 (Diltiazem hcl coated beads) .[name]... 1 by mouth once daily    Demadex 20 Mg Tabs (Torsemide) .[name]... Take 1 tab by mouth once daily...  Problem # 3:  RENAL INSUFFICIENCY (ICD-588.9) Her Creat was up to 2.67 & we decided to decr the diuretics and control edema w/ no salt, elevation, TEDs etc...  Problem # 4:  VENOUS INSUFFICIENCY (ICD-459.81) As above...  Problem # 5:  HYPOTHYROIDISM (ICD-244.9) Stable>  same med... Her updated medication list for this problem includes:    Synthroid 125 Mcg Tabs (Levothyroxine sodium) .[name]... Take 1/2 tablet by mouth once daily  Problem # 6:  DIVERTICULOSIS OF COLON (ICD-562.10) GI is back to baseline w/o recurrent pain...  Problem # 7:  DEGENERATIVE JOINT DISEASE (ICD-715.90) Aware> followed by DrYates et al... Her updated medication list for this problem includes:    Aspirin  81 Mg Tbec (Aspirin) .[name]... Take 1 tablet by mouth once a day    Mobic 7.5 Mg Tabs (Meloxicam) .[name]... Take 1 tab by mouth once daily as needed for arthritis pain...    Hydrocodone-acetaminophen 5-500 Mg Tabs (Hydrocodone-acetaminophen) .[name]... Take 1/2 to 1 tab by mouth three times a day as needed for pain...  Problem # 8:  OTHER MEDICAL PROBLEMS AS NOTED>>>  Complete Medication List: 1)  Timoptic 0.5 % Soln (Timolol maleate) .[name].. 1 drop in each eye every 12 hours 2)  Fluorometholone 0.1 % Susp (Fluorometholone) .[name].. 1 drop in right eye at bedtime 3)  Aspirin 81 Mg Tbec (Aspirin) .... Take 1 tablet by mouth once a day 4)  Cartia Xt 180 Mg Cp24 (Diltiazem hcl coated beads) .[name].. 1 by mouth once daily 5)  Demadex 20 Mg Tabs (Torsemide) .... Take 1 tab by mouth once daily.Marland KitchenMarland Kitchen 6)  Simvastatin 20 Mg Tabs (Simvastatin) .... Take 1 tab by mouth at bedtime 7)  Synthroid 125 Mcg Tabs (Levothyroxine sodium) .... Take 1/2 tablet by mouth once daily 8)  Prilosec 20 Mg Cpdr (Omeprazole) .... Take 1 tablet by mouth once a day 9)  Mobic 7.5 Mg Tabs (Meloxicam) .... Take 1 tab by mouth once daily as needed for arthritis pain... 10)  Alprazolam 0.25 Mg Tabs (Alprazolam) .... Take 1/2 to 1 tablet by mouth three times a day as needed for nerves 11)  Vitamin D 1000 Unit Caps (Cholecalciferol) .... Take 1 cap by mouth once daily... 12)  Vitamin C 500 Mg Tabs (Ascorbic acid) .... Take 1 tablet by mouth once a day 13)  Mucinex 600 Mg Xr12h-tab (Guaifenesin) .... Take one tablet by mouth two times a day 14)  Hydrocodone-acetaminophen 5-500 Mg Tabs (Hydrocodone-acetaminophen) .... Take 1/2 to 1 tab by mouth three times a day as needed for pain...  Patient Instructions: 1)  Today we updated your med list- see below.... 2)  We decided to stop the Aldactone due to your renal insuffic.Marland KitchenMarland Kitchen 3)  Continue your other meds the same... 4)  Remember to drink plenty of water, but avoid salt/ sodium.Marland KitchenMarland Kitchen  5)  Keep your legs  elevated... 6)  OK to switch to support hose... 7)  Let us know if you develope any further abd pain or discomfort... 8)  Let's plan a follow up visit in 2-3 months.Marland KitchenMarland Kitchen

## 2010-10-31 NOTE — Progress Notes (Signed)
Summary: swelling in legs  Phone Note Call from Patient Call back at Home Phone [phone]   Caller: Patient Call For: nadel Summary of Call: Pt c/o of swelling in legs since last ov (1/23) also states she's experiencing soreness, itching, and burning pls advise.//[facility] Initial call taken by: Darletta Moll,  October 26, 2010 9:10 AM  Follow-up for Phone Call        Pt c/o bilateral lower extremity edema with pain, warm to touch, burning, redness, rash, itching. States she had to cut her compression hose off once due to the swelling.   Also pt states Dr. Dagoberto Ligas is retiring and she wants to know if SN is willing to manage her Thyroid and Cholesterol. Please advise. Thanks. Zackery Barefoot CMA  October 26, 2010 10:29 AM   Additional Follow-up for Phone Call Additional follow up Details #1::        per SN----swelling is probably worse due to decrease in diurectics due to elevated creat.   recs are for pt to  use no salt, elevate her legs and support hose ----we can try the new pneumatic compression therapy at home if she would like...this normally comes from [facility]  please let me know if pt would like to try this and we will send the order to [facility].  thanks Randell Loop CMA  October 26, 2010 11:24 AM     Additional Follow-up for Phone Call Additional follow up Details #2::    Pt informed of above and declines compression therapy at this time. Zackery Barefoot CMA  October 26, 2010 11:49 AM

## 2010-11-01 ENCOUNTER — Ambulatory Visit: Payer: Self-pay | Admitting: Pulmonary Disease

## 2010-11-05 ENCOUNTER — Telehealth: Payer: Self-pay | Admitting: Adult Health

## 2010-11-06 ENCOUNTER — Encounter: Payer: Self-pay | Admitting: Pulmonary Disease

## 2010-11-06 ENCOUNTER — Ambulatory Visit (INDEPENDENT_AMBULATORY_CARE_PROVIDER_SITE_OTHER): Payer: Medicare Other | Admitting: Adult Health

## 2010-11-06 ENCOUNTER — Encounter: Payer: Self-pay | Admitting: Adult Health

## 2010-11-06 ENCOUNTER — Encounter (INDEPENDENT_AMBULATORY_CARE_PROVIDER_SITE_OTHER): Payer: Medicare Other

## 2010-11-06 DIAGNOSIS — R609 Edema, unspecified: Secondary | ICD-10-CM

## 2010-11-06 DIAGNOSIS — I872 Venous insufficiency (chronic) (peripheral): Secondary | ICD-10-CM

## 2010-11-06 DIAGNOSIS — M79609 Pain in unspecified limb: Secondary | ICD-10-CM

## 2010-11-08 NOTE — Assessment & Plan Note (Signed)
Summary: Acute NP office visit - cellulitis   Primary Provider/Referring Provider:  Kriste Basque  CC:  LE edema, calves hard to touch, redness, warm to touch, and sore to touch x6days.  History of Present Illness: 77 y/o WF with known history of DJD, glaucoma, asthmatic bronchitis    ~  May 03, 2010:  she had Shingles right T9-10 distrib> resolved after Valtrex, Vicodin Rx... c/o pain right hip area w/ shot DrYates- no benefit;  then eval DrNewton w/ epid steroid shot- only sl better... BP remains controlled on meds & fluid status stable;  Chol looks good on Simva20 per DrGegick, & also on Synthroid 1/2 of the dose... she wants f/u labs here today...   ~  October 15, 2010:  she went to ER 1/17 w/ epig pain x several hrs- resolved spont & no recurrence... they did CTAbd (bilat renal cysts & bilat adnexal lesions- left is larger than prev> prev eval/ followed by DrMcPhail); and labs w/ BUN=56, Creat=2.67... given Flafyl & told to f/u here> we will decr her diuretic Rx to Demadex 20mg /d (stop Aldactone) & reinforce no salt, elevation, support hose to rx edema...  October 31, 2010 --Presents for an acute office visit.Complains of LE edema, calves tight to  touch, redness, warm to touch, sore to touch x6days. Last ov aldactone was stopped d/t worsening renal insufficiency. Since then legs more swollen. Over last 5 days becoming red and tender . Warm to touch..Hard to wear TEDS stockings. Denies chest pain,, orthopnea, hemoptysis, fever, n/v/d, edema, headache.        Medications Prior to Update: 1)  Timoptic 0.5 % Soln (Timolol Maleate) .[name].. 1 Drop in Each Eye Every 12 Hours 2)  Fluorometholone 0.1 % Susp (Fluorometholone) .[name].. 1 Drop in Right Eye At Bedtime 3)  Aspirin 81 Mg Tbec (Aspirin) .... Take 1 Tablet By Mouth Once A Day 4)  Cartia Xt 180 Mg  Cp24 (Diltiazem Hcl Coated Beads) .[name].. 1 By Mouth Once Daily 5)  Demadex 20 Mg  Tabs (Torsemide) .... Take 1 Tab By Mouth Once Daily.Marland KitchenMarland Kitchen 6)   Simvastatin 20 Mg Tabs (Simvastatin) .... Take 1 Tab By Mouth At Bedtime 7)  Synthroid 125 Mcg  Tabs (Levothyroxine Sodium) .... Take 1/2 Tablet By Mouth Once Daily 8)  Prilosec 20 Mg Cpdr (Omeprazole) .... Take 1 Tablet By Mouth Once A Day 9)  Mobic 7.5 Mg Tabs (Meloxicam) .... Take 1 Tab By Mouth Once Daily As Needed For Arthritis Pain... 10)  Alprazolam 0.25 Mg  Tabs (Alprazolam) .... Take 1/2 To 1 Tablet By Mouth Three Times A Day As Needed For Nerves 11)  Vitamin D 1000 Unit Caps (Cholecalciferol) .... Take 1 Cap By Mouth Once Daily... 12)  Vitamin C 500 Mg Tabs (Ascorbic Acid) .... Take 1 Tablet By Mouth Once A Day 13)  Mucinex 600 Mg Xr12h-Tab (Guaifenesin) .... Take One Tablet By Mouth Two Times A Day 14)  Hydrocodone-Acetaminophen 5-500 Mg Tabs (Hydrocodone-Acetaminophen) .... Take 1/2 To 1 Tab By Mouth Three Times A Day As Needed For Pain...  Current Medications (verified): 1)  Timoptic 0.5 % Soln (Timolol Maleate) .[name].. 1 Drop in Each Eye Every 12 Hours 2)  Fluorometholone 0.1 % Susp (Fluorometholone) .[name].. 1 Drop in Right Eye At Bedtime 3)  Aspirin 81 Mg Tbec (Aspirin) .... Take 1 Tablet By Mouth Once A Day 4)  Cartia Xt 180 Mg  Cp24 (Diltiazem Hcl Coated Beads) .[name].. 1 By Mouth Once Daily 5)  Demadex 20 Mg  Tabs (Torsemide) .... Take 1 Tab By Mouth Once Daily.Marland KitchenMarland Kitchen 6)  Simvastatin 20 Mg Tabs (Simvastatin) .... Take 1 Tab By Mouth At Bedtime 7)  Synthroid 125 Mcg  Tabs (Levothyroxine Sodium) .... Take 1/2 Tablet By Mouth Once Daily 8)  Prilosec 20 Mg Cpdr (Omeprazole) .... Take 1 Tablet By Mouth Once A Day 9)  Mobic 7.5 Mg Tabs (Meloxicam) .... Take 1 Tab By Mouth Once Daily As Needed For Arthritis Pain... 10)  Alprazolam 0.25 Mg  Tabs (Alprazolam) .... Take 1/2 To 1 Tablet By Mouth Three Times A Day As Needed For Nerves 11)  Vitamin D 1000 Unit Caps (Cholecalciferol) .... Take 1 Cap By Mouth Once Daily... 12)  Vitamin C 500 Mg Tabs (Ascorbic Acid) .... Take 1 Tablet By Mouth Once A  Day 13)  Mucinex 600 Mg Xr12h-Tab (Guaifenesin) .... Take One Tablet By Mouth Two Times A Day 14)  Hydrocodone-Acetaminophen 5-500 Mg Tabs (Hydrocodone-Acetaminophen) .... Take 1/2 To 1 Tab By Mouth Three Times A Day As Needed For Pain...  Allergies (verified): 1)  Sulfa 2)  Biaxin (Clarithromycin) 3)  Codeine 4)  Morphine  Past History:  Past Medical History: Last updated: 10/15/2010 GLAUCOMA (ICD-365.9) Hx of ASTHMATIC BRONCHITIS, ACUTE (ICD-466.0) HYPERTENSION (ICD-401.9) PALPITATIONS, HX OF (ICD-V12.50) VENOUS INSUFFICIENCY (ICD-459.81) HYPERCHOLESTEROLEMIA (ICD-272.0) HYPOTHYROIDISM (ICD-244.9) GERD (ICD-530.81) DIVERTICULOSIS OF COLON (ICD-562.10) COLONIC POLYPS (ICD-211.3) RENAL INSUFFICIENCY (ICD-588.9) DEGENERATIVE JOINT DISEASE (ICD-715.90) VITAMIN D DEFICIENCY (ICD-268.9) ANXIETY (ICD-300.00)  Past Surgical History: Last updated: 10/15/2010 S/P cataract surg S/P Hysterectomy S/P benign breast biopsy S/P right total knee replacement in 1999 S/P left TKR 3/10 by DrYates S/P right second toe part amputation by Volney Presser 2010  Family History: Last updated: 05-20-08 mother deceased age 96 from heart problems father deceased age 85 from heart problems  hx of throat cancer 1 sibling alive age 70  hx of DM 1 sibling deceased from cancer  Social History: Last updated: 05/03/2010 never smoked no etoh widowed 1 daughter -  hx of DM  Risk Factors: Smoking Status: never (10/15/2010)  Review of Systems      See HPI  Vital Signs:  Patient profile:   77 year old female Height:      63 inches Weight:      157.38 pounds BMI:     27.98 O2 Sat:      97 % on Room air Temp:     97.5 degrees F oral Pulse rate:   76 / minute BP sitting:   126 / 74  (right arm) Cuff size:   regular  Vitals Entered By: Boone Master CNA/MA (October 31, 2010 11:32 AM)  O2 Flow:  Room air CC: LE edema, calves hard to touch, redness, warm to touch, sore to touch x6days Is  Patient Diabetic? No Comments Medications reviewed with patient Daytime contact number verified with patient. Boone Master CNA/MA  October 31, 2010 11:32 AM    Physical Exam  Additional Exam:  WD, WN, 77 y/o WF in NAD... GENERAL:  Alert & oriented; pleasant & cooperative... HEENT:  [place]/AT,  EACs-clear, TMs-wnl, NOSE-clear, THROAT-clear & wnl. NECK:  Supple w/ fairROM; no JVD; normal carotid impulses w/o bruits; no thyromegaly or nodules palpated; no lymphadenopathy. CHEST:  Clear to P & A; without wheezes/ rales/ or rhonchi heard... HEART:  Regular Rhythm;  gr 1/6 SEM without rubs or gallops detected... ABDOMEN:  Soft & nontender; normal bowel sounds; no organomegaly or masses detected. EXT:  moderate arthritic changes; mild varicose veins/ +venous insuffic/ 2+edema right >  left, stasis dermatiatic changes w/increased redness esp on right , warm to touch NEURO:  CN's intact;  no focal neuro deficits...    Impression & Recommendations:  Problem # 1:  VENOUS INSUFFICIENCY (ICD-459.81)  venous insufficiency with edema and probable early cellultis   Plan:  Keflex 500mg  four times a day for 7 days Keep legs elevated , TEDS hose during day, take off at night Low salt diet.  Incrase toresimide 20mg  2 tabs once daily for 2 days follow up 2 weeks and as needed  Please contact office for sooner follow up if symptoms do not improve or worsen   Orders: Est. Patient Level IV ([address])  Problem # 2:  RENAL INSUFFICIENCY (ICD-588.9) labs today   Medications Added to Medication List This Visit: 1)  Keflex 500 Mg Caps (Cephalexin) .[name].. 1 by mouth four times a day  Other Orders: TLB-BMP (Basic Metabolic Panel-BMET) (80048-METABOL)  Patient Instructions: 1)  Keflex 500mg  four times a day for 7 days 2)  Keep legs elevated , TEDS hose during day, take off at night 3)  Low salt diet.  4)  Incrase toresimide 20mg  2 tabs once daily for 2 days 5)  follow up 2 weeks and as needed  6)  Please  contact office for sooner follow up if symptoms do not improve or worsen  Prescriptions: KEFLEX 500 MG CAPS (CEPHALEXIN) 1 by mouth four times a day  #28 x 0   Entered and Authorized by:   Rubye Oaks NP   Signed by:   Emmanuell Kantz NP on 10/31/2010   Method used:   [facility]        [facility] [address] [phone]* (retail)       [address]       [place], [place]  [address]       Ph: [phone] or [phone]       Fax: [phone]   RxID:   [id number]

## 2010-11-14 ENCOUNTER — Ambulatory Visit: Payer: Medicare Other | Admitting: Adult Health

## 2010-11-14 NOTE — Letter (Signed)
Summary: [facility]   Imported By: Sherian Rein 11/07/2010 11:30:46  _____________________________________________________________________  External Attachment:    Type:   Image     Comment:   External Document

## 2010-11-14 NOTE — Progress Notes (Signed)
Summary: swollen/ painful leg  Phone Note Call from Patient Call back at Home Phone [phone]   Caller: Patient Call For: parrett Summary of Call: pt was seen 10/31/10 for cellullitis in leg. leg still swollen/ red/ painful. no improvement [facility] pt[facility] [address]  Initial call taken by: Tivis Ringer, CNA,  November 05, 2010 8:42 AM  Follow-up for Phone Call        Spoke with pt and she states her swelling has improved some, and her redness has also improved but the burning sensation is much worse. She states it is not her skin burning, it feels like inside her legs are burning. Pt states she is taking keflex, keeping legs elevated, and wearing TED hose as well. She did increased does of torsemide x 2 days as directed. Please advise.Carron Curie CMA  November 05, 2010 10:28 AM   Additional Follow-up for Phone Call Additional follow up Details #1::        set up for venous dopppler ov tomorrow after doppler.   Additional Follow-up by: Rubye Oaks NP,  November 05, 2010 10:32 AM    Additional Follow-up for Phone Call Additional follow up Details #2::    pt aware order placed for doppler and she needs appt after.Carron Curie CMA  November 05, 2010 10:40 AM

## 2010-11-14 NOTE — Assessment & Plan Note (Signed)
Summary: NP follow-up for LE Edema   Primary Provider/Referring Provider:  Kriste Basque  CC:  Follow-up after doppler.  Pt states swelling has improved some and but pain still continues. .  History of Present Illness: 77 y/o WF with known history of DJD, glaucoma, asthmatic bronchitis    ~  May 03, 2010:  she had Shingles right T9-10 distrib> resolved after Valtrex, Vicodin Rx... c/o pain right hip area w/ shot DrYates- no benefit;  then eval DrNewton w/ epid steroid shot- only sl better... BP remains controlled on meds & fluid status stable;  Chol looks good on Simva20 per DrGegick, & also on Synthroid 1/2 of the dose... she wants f/u labs here today...   ~  October 15, 2010:  she went to ER 1/17 w/ epig pain x several hrs- resolved spont & no recurrence... they did CTAbd (bilat renal cysts & bilat adnexal lesions- left is larger than prev> prev eval/ followed by DrMcPhail); and labs w/ BUN=56, Creat=2.67... given Flafyl & told to f/u here> we will decr her diuretic Rx to Demadex 20mg /d (stop Aldactone) & reinforce no salt, elevation, support hose to rx edema...  October 31, 2010 --Presents for an acute office visit.Complains of LE edema, calves tight to  touch, redness, warm to touch, sore to touch x6days. Last ov aldactone was stopped d/t worsening renal insufficiency. Since then legs more swollen. Over last 5 days becoming red and tender . Warm to touch..Hard to wear TEDS stockings. >>tx w/ keflex   November 06, 2010 --Presents for follow up  after doppler.  Pt states swelling has improved some, but pain still continues. She ws seen 1 week ago for LE edema and probable cellulits , Tx with Keflex. Recently aldactone stopped and toresmide decreased due worsening renal insufficiency-swelling worse since then. Last week with increased redness. She called back in this week with no significant improvement despite toresimide increase and was set up for venous doppler-which were done today -NEG. for  DVT. Today legs are less swollen and red. No fever. Has 2 days  left of Keflex.  No fever or dyspnea. Renal fxn improved last ov w/ scr 2.6 >1.9.   Preventive Screening-Counseling & Management  Alcohol-Tobacco     Smoking Status: never  Current Medications (verified): 1)  Timoptic 0.5 % Soln (Timolol Maleate) .[name].. 1 Drop in Each Eye Every 12 Hours 2)  Fluorometholone 0.1 % Susp (Fluorometholone) .[name].. 1 Drop in Right Eye At Bedtime 3)  Aspirin 81 Mg Tbec (Aspirin) .... Take 1 Tablet By Mouth Once A Day 4)  Cartia Xt 180 Mg  Cp24 (Diltiazem Hcl Coated Beads) .[name].. 1 By Mouth Once Daily 5)  Demadex 20 Mg  Tabs (Torsemide) .... Take 1 Tab By Mouth Once Daily.Marland KitchenMarland Kitchen 6)  Simvastatin 20 Mg Tabs (Simvastatin) .... Take 1 Tab By Mouth At Bedtime 7)  Synthroid 125 Mcg  Tabs (Levothyroxine Sodium) .... Take 1/2 Tablet By Mouth Once Daily 8)  Prilosec 20 Mg Cpdr (Omeprazole) .... Take 1 Tablet By Mouth Once A Day 9)  Mobic 7.5 Mg Tabs (Meloxicam) .... Take 1 Tab By Mouth Once Daily As Needed For Arthritis Pain... 10)  Alprazolam 0.25 Mg  Tabs (Alprazolam) .... Take 1/2 To 1 Tablet By Mouth Three Times A Day As Needed For Nerves 11)  Vitamin D 1000 Unit Caps (Cholecalciferol) .... Take 1 Cap By Mouth Once Daily... 12)  Vitamin C 500 Mg Tabs (Ascorbic Acid) .... Take 1 Tablet By Mouth Once A Day 13)  Mucinex 600 Mg Xr12h-Tab (Guaifenesin) .... Take One Tablet By Mouth Two Times A Day 14)  Hydrocodone-Acetaminophen 5-500 Mg Tabs (Hydrocodone-Acetaminophen) .... Take 1/2 To 1 Tab By Mouth Three Times A Day As Needed For Pain... 15)  Keflex 500 Mg Caps (Cephalexin) .[name].. 1 By Mouth Four Times A Day  Allergies (verified): 1)  Sulfa 2)  Biaxin (Clarithromycin) 3)  Codeine 4)  Morphine  Past History:  Past Medical History: Last updated: 10/15/2010 GLAUCOMA (ICD-365.9) Hx of ASTHMATIC BRONCHITIS, ACUTE (ICD-466.0) HYPERTENSION (ICD-401.9) PALPITATIONS, HX OF (ICD-V12.50) VENOUS INSUFFICIENCY  (ICD-459.81) HYPERCHOLESTEROLEMIA (ICD-272.0) HYPOTHYROIDISM (ICD-244.9) GERD (ICD-530.81) DIVERTICULOSIS OF COLON (ICD-562.10) COLONIC POLYPS (ICD-211.3) RENAL INSUFFICIENCY (ICD-588.9) DEGENERATIVE JOINT DISEASE (ICD-715.90) VITAMIN D DEFICIENCY (ICD-268.9) ANXIETY (ICD-300.00)  Past Surgical History: Last updated: 10/15/2010 S/P cataract surg S/P Hysterectomy S/P benign breast biopsy S/P right total knee replacement in 1999 S/P left TKR 3/10 by DrYates S/P right second toe part amputation by Volney Presser 2010  Family History: Last updated: June 07, 2008 mother deceased age 81 from heart problems father deceased age 75 from heart problems  hx of throat cancer 1 sibling alive age 74  hx of DM 1 sibling deceased from cancer  Social History: Last updated: 05/03/2010 never smoked no etoh widowed 1 daughter -  hx of DM  Risk Factors: Smoking Status: never (11/06/2010)  Review of Systems      See HPI  Vital Signs:  Patient profile:   77 year old female Height:      63 inches Weight:      161.25 pounds O2 Sat:      96 % on Room air Pulse rate:   86 / minute BP sitting:   128 / 70  (right arm) Cuff size:   regular  Vitals Entered By: Carron Curie CMA (November 06, 2010 2:34 PM)  O2 Flow:  Room air CC: Follow-up after doppler.  Pt states swelling has improved some, but pain still continues.  Is Patient Diabetic? No Comments Medications reviewed with patient Carron Curie CMA  November 06, 2010 2:39 PM Daytime phone number verified with patient.    Physical Exam  Additional Exam:  WD, WN, 77 y/o WF in NAD... GENERAL:  Alert & oriented; pleasant & cooperative... HEENT:  [place]/AT,  EACs-clear, TMs-wnl, NOSE-clear, THROAT-clear & wnl. NECK:  Supple w/ fairROM; no JVD; normal carotid impulses w/o bruits; no thyromegaly or nodules palpated; no lymphadenopathy. CHEST:  Clear to P & A; without wheezes/ rales/ or rhonchi heard... HEART:  Regular Rhythm;  gr 1/6 SEM  without rubs or gallops detected... ABDOMEN:  Soft & nontender; normal bowel sounds; no organomegaly or masses detected. EXT:  moderate arthritic changes; mild varicose veins/ +venous insuffic/ 1-2+edema right > left, stasis dermatiatic changes w/ decreased redness  NEURO:  CN's intact;  no focal neuro deficits...    Impression & Recommendations:  Problem # 1:  EDEMA (ICD-782.3)  slightly improved w/ suspected cellulits -resolving.  Plan:  Finish Keflex 500mg   Keep legs elevated , TEDS hose during day, take off at night Low salt diet.  Stop toresimide Begin Furosemide -Lasix 20 mg 2 tabs once daily x 3 days then 1 by mouth once daily  follow up Dr. Kriste Basque in April as planned and as needed  Please contact office for sooner follow up if symptoms do not improve or worsen   Orders: Est. Patient Level III ([address])  Medications Added to Medication List This Visit: 1)  Furosemide 20 Mg Tabs (Furosemide) .[name].. 1 -2 by mouth once daily for  swelling  Patient Instructions: 1)  Finish Keflex 500mg   2)  Keep legs elevated , TEDS hose during day, take off at night 3)  Low salt diet.  4)  Stop toresimide 5)  Begin Furosemide -Lasix 20 mg 2 tabs once daily x 3 days then 1 by mouth once daily  6)  follow up Dr. Kriste Basque in April as planned and as needed  7)  Please contact office for sooner follow up if symptoms do not improve or worsen  Prescriptions: FUROSEMIDE 20 MG TABS (FUROSEMIDE) 1 -2 by mouth once daily for swelling  #60 x 5   Entered by:   Carron Curie CMA   Authorized by:   Rubye Oaks NP   Signed by:   Carron Curie CMA on 11/06/2010   Method used:   [facility]        [facility] [address] [phone]* (retail)       [address]       [place], [place]  [address]       Ph: [phone] or [phone]       Fax: [phone]   RxID:   [id number] HYDROCODONE-ACETAMINOPHEN 5-500 MG TABS (HYDROCODONE-ACETAMINOPHEN) take 1/2 to 1 tab by mouth  three times a day as needed for pain...  #50 x 0   Entered and Authorized by:   Rubye Oaks NP   Signed by:   [name] Parrett NP on 11/06/2010   Method used:   Print then Give to Patient   RxID:   [id number] FUROSEMIDE 20 MG TABS (FUROSEMIDE) 1 -2 by mouth once daily for swelling  #60 x 5   Entered and Authorized by:   Rubye Oaks NP   Signed by:   [name] Parrett NP on 11/06/2010   Method used:   Print then Give to Patient   RxID:   [id number]

## 2010-11-14 NOTE — Miscellaneous (Signed)
Summary: Orders Update  Clinical Lists Changes  Orders: Added new Test order of Venous Duplex Lower Extremity (Venous Duplex Lower) - Signed 

## 2010-11-19 ENCOUNTER — Other Ambulatory Visit: Payer: Medicare Other

## 2010-11-19 ENCOUNTER — Ambulatory Visit (INDEPENDENT_AMBULATORY_CARE_PROVIDER_SITE_OTHER): Payer: Medicare Other | Admitting: Adult Health

## 2010-11-19 ENCOUNTER — Other Ambulatory Visit: Payer: Self-pay | Admitting: Adult Health

## 2010-11-19 ENCOUNTER — Encounter: Payer: Self-pay | Admitting: Adult Health

## 2010-11-19 DIAGNOSIS — R609 Edema, unspecified: Secondary | ICD-10-CM

## 2010-11-19 LAB — BASIC METABOLIC PANEL
Chloride: 103 mEq/L (ref 96–112)
Potassium: 4.3 mEq/L (ref 3.5–5.1)
Sodium: 141 mEq/L (ref 135–145)

## 2010-11-20 LAB — BRAIN NATRIURETIC PEPTIDE: Pro B Natriuretic peptide (BNP): 145.5 pg/mL — ABNORMAL HIGH (ref 0.0–100.0)

## 2010-11-26 ENCOUNTER — Telehealth (INDEPENDENT_AMBULATORY_CARE_PROVIDER_SITE_OTHER): Payer: Self-pay | Admitting: *Deleted

## 2010-11-29 NOTE — Assessment & Plan Note (Signed)
Summary: OV LEGS AND FEET SWELLING//SH   Primary Provider/Referring Provider:  Kriste Basque  CC:  Acute visit.  Pt states that edema in both feet is no better since last seen.  Right worse than left.  She states that the pain in legs has improved.  [name]  History of Present Illness: 77 y/o WF with known history of DJD, glaucoma, asthmatic bronchitis    ~  May 03, 2010:  she had Shingles right T9-10 distrib> resolved after Valtrex, Vicodin Rx... c/o pain right hip area w/ shot DrYates- no benefit;  then eval DrNewton w/ epid steroid shot- only sl better... BP remains controlled on meds & fluid status stable;  Chol looks good on Simva20 per DrGegick, & also on Synthroid 1/2 of the dose... she wants f/u labs here today...   ~  October 15, 2010:  she went to ER 1/17 w/ epig pain x several hrs- resolved spont & no recurrence... they did CTAbd (bilat renal cysts & bilat adnexal lesions- left is larger than prev> prev eval/ followed by DrMcPhail); and labs w/ BUN=56, Creat=2.67... given Flafyl & told to f/u here> we will decr her diuretic Rx to Demadex 20mg /d (stop Aldactone) & reinforce no salt, elevation, support hose to rx edema...  October 31, 2010 --Presents for an acute office visit.Complains of LE edema, calves tight to  touch, redness, warm to touch, sore to touch x6days. Last ov aldactone was stopped d/t worsening renal insufficiency. Since then legs more swollen. Over last 5 days becoming red and tender . Warm to touch..Hard to wear TEDS stockings. >>tx w/ keflex   November 06, 2010 --Presents for follow up  after doppler.  Pt states swelling has improved some, but pain still continues. She ws seen 1 week ago for LE edema and probable cellulits , Tx with Keflex. Recently aldactone stopped and toresmide decreased due worsening renal insufficiency-swelling worse since then. Last week with increased redness. She called back in this week with no significant improvement despite toresimide increase and  was set up for venous doppler-which were done today -NEG. for DVT. Today legs are less swollen and red. No fever. Has 2 days  left of Keflex.  No fever or dyspnea. Renal fxn improved last ov w/ scr 2.6 >1.9.  11/19/10--Returns for follow up . Seen .2 weeks ago for venous insufficiency and edema. Venous doppler were neg. SHe was changed from Torsemide to Lasix. She has not seen significant decrease in swelling.However when she wears TED hose it does help. Pain and redness improved with elevation   Pt states that edema in both feet is no better since last seen.  Right worse than left.  She states that the pain in legs has improved.  Denies chest pain, dyspnea  orthopnea, hemoptysis, fever, n/v/d.  Preventive Screening-Counseling & Management  Alcohol-Tobacco     Smoking Status: never  Medications Prior to Update: 1)  Timoptic 0.5 % Soln (Timolol Maleate) .[name].. 1 Drop in Each Eye Every 12 Hours 2)  Fluorometholone 0.1 % Susp (Fluorometholone) .[name].. 1 Drop in Right Eye At Bedtime 3)  Aspirin 81 Mg Tbec (Aspirin) .... Take 1 Tablet By Mouth Once A Day 4)  Cartia Xt 180 Mg  Cp24 (Diltiazem Hcl Coated Beads) .[name].. 1 By Mouth Once Daily 5)  Furosemide 20 Mg Tabs (Furosemide) .[name].. 1 -2 By Mouth Once Daily For Swelling 6)  Simvastatin 20 Mg Tabs (Simvastatin) .... Take 1 Tab By Mouth At Bedtime 7)  Synthroid 125 Mcg  Tabs (  Levothyroxine Sodium) .... Take 1/2 Tablet By Mouth Once Daily 8)  Prilosec 20 Mg Cpdr (Omeprazole) .... Take 1 Tablet By Mouth Once A Day 9)  Mobic 7.5 Mg Tabs (Meloxicam) .... Take 1 Tab By Mouth Once Daily As Needed For Arthritis Pain... 10)  Alprazolam 0.25 Mg  Tabs (Alprazolam) .... Take 1/2 To 1 Tablet By Mouth Three Times A Day As Needed For Nerves 11)  Vitamin D 1000 Unit Caps (Cholecalciferol) .... Take 1 Cap By Mouth Once Daily... 12)  Vitamin C 500 Mg Tabs (Ascorbic Acid) .... Take 1 Tablet By Mouth Once A Day 13)  Mucinex 600 Mg Xr12h-Tab (Guaifenesin) .... Take One Tablet By  Mouth Two Times A Day 14)  Hydrocodone-Acetaminophen 5-500 Mg Tabs (Hydrocodone-Acetaminophen) .... Take 1/2 To 1 Tab By Mouth Three Times A Day As Needed For Pain... 15)  Keflex 500 Mg Caps (Cephalexin) .[name].. 1 By Mouth Four Times A Day  Current Medications (verified): 1)  Timoptic 0.5 % Soln (Timolol Maleate) .[name].. 1 Drop in Each Eye Every 12 Hours 2)  Fluorometholone 0.1 % Susp (Fluorometholone) .[name].. 1 Drop in Right Eye At Bedtime 3)  Aspirin 81 Mg Tbec (Aspirin) .... Take 1 Tablet By Mouth Once A Day 4)  Cartia Xt 180 Mg  Cp24 (Diltiazem Hcl Coated Beads) .[name].. 1 By Mouth Once Daily 5)  Furosemide 20 Mg Tabs (Furosemide) .[name].. 1 -2 By Mouth Once Daily For Swelling 6)  Simvastatin 20 Mg Tabs (Simvastatin) .... Take 1 Tab By Mouth At Bedtime 7)  Synthroid 125 Mcg  Tabs (Levothyroxine Sodium) .... Take 1/2 Tablet By Mouth Once Daily 8)  Prilosec 20 Mg Cpdr (Omeprazole) .... Take 1 Tablet By Mouth Once A Day 9)  Mobic 7.5 Mg Tabs (Meloxicam) .... Take 1 Tab By Mouth Once Daily As Needed For Arthritis Pain... 10)  Alprazolam 0.25 Mg  Tabs (Alprazolam) .... Take 1/2 To 1 Tablet By Mouth Three Times A Day As Needed For Nerves 11)  Vitamin D 1000 Unit Caps (Cholecalciferol) .... Take 1 Cap By Mouth Once Daily... 12)  Vitamin C 500 Mg Tabs (Ascorbic Acid) .... Take 1 Tablet By Mouth Once A Day 13)  Mucinex 600 Mg Xr12h-Tab (Guaifenesin) .... Take One Tablet By Mouth Two Times A Day 14)  Hydrocodone-Acetaminophen 5-500 Mg Tabs (Hydrocodone-Acetaminophen) .... Take 1/2 To 1 Tab By Mouth Three Times A Day As Needed For Pain... 15)  Systane 0.4-0.3 % Soln (Polyethyl Glycol-Propyl Glycol) .[name].. 1 Drop Each Eye Two Times A Day  Allergies (verified): 1)  Sulfa 2)  Biaxin (Clarithromycin) 3)  Codeine 4)  Morphine  Past History:  Past Medical History: Last updated: 10/15/2010 GLAUCOMA (ICD-365.9) Hx of ASTHMATIC BRONCHITIS, ACUTE (ICD-466.0) HYPERTENSION (ICD-401.9) PALPITATIONS, HX OF  (ICD-V12.50) VENOUS INSUFFICIENCY (ICD-459.81) HYPERCHOLESTEROLEMIA (ICD-272.0) HYPOTHYROIDISM (ICD-244.9) GERD (ICD-530.81) DIVERTICULOSIS OF COLON (ICD-562.10) COLONIC POLYPS (ICD-211.3) RENAL INSUFFICIENCY (ICD-588.9) DEGENERATIVE JOINT DISEASE (ICD-715.90) VITAMIN D DEFICIENCY (ICD-268.9) ANXIETY (ICD-300.00)  Past Surgical History: Last updated: 10/15/2010 S/P cataract surg S/P Hysterectomy S/P benign breast biopsy S/P right total knee replacement in 1999 S/P left TKR 3/10 by DrYates S/P right second toe part amputation by Volney Presser 2010  Family History: Last updated: 2008-05-13 mother deceased age 45 from heart problems father deceased age 8 from heart problems  hx of throat cancer 1 sibling alive age 44  hx of DM 1 sibling deceased from cancer  Social History: Last updated: 05/03/2010 never smoked no etoh widowed 1 daughter -  hx of DM  Risk Factors: Smoking Status:  never (11/19/2010)  Review of Systems      See HPI  Vital Signs:  Patient profile:   77 year old female Weight:      161 pounds O2 Sat:      100 % on Room air Temp:     97.9 degrees F oral Pulse rate:   69 / minute BP sitting:   142 / 66  (left arm)  Vitals Entered By: Vernie Murders (November 19, 2010 2:28 PM)  O2 Flow:  Room air CC: Acute visit.  Pt states that edema in both feet is no better since last seen.  Right worse than left.  She states that the pain in legs has improved.   Is Patient Diabetic? No   Physical Exam  Additional Exam:  WD, WN, 77 y/o WF in NAD... GENERAL:  Alert & oriented; pleasant & cooperative... HEENT:  [place]/AT,  EACs-clear, TMs-wnl, NOSE-clear, THROAT-clear & wnl. NECK:  Supple w/ fairROM; no JVD; normal carotid impulses w/o bruits; no thyromegaly or nodules palpated; no lymphadenopathy. CHEST:  Clear to P & A; without wheezes/ rales/ or rhonchi heard... HEART:  Regular Rhythm;  gr 1/6 SEM without rubs or gallops detected... ABDOMEN:  Soft & nontender;  normal bowel sounds; no organomegaly or masses detected. EXT:  moderate arthritic changes; mild varicose veins/ +venous insuffic/ 1-+edema bilaterally  stasis dermatiatic changes w/ resolved  redness --improved from last ov  NEURO:  CN's intact;  no focal neuro deficits...    Impression & Recommendations:  Problem # 1:  EDEMA (ICD-782.3) Venous insufficiency with edema -seems improved on exam  check labs with bnp and bmet  plan; Keep legs elevated , TEDS hose during day, take off at night Low salt diet.  Continue on  Furosemide -Lasix 20 mg 1-2 tabs once daily   follow up Dr. Kriste Basque in April as planned and as needed  Please contact office for sooner follow up if symptoms do not improve or worsen  STop Mobic  Orders: TLB-BMP (Basic Metabolic Panel-BMET) (80048-METABOL) TLB-BNP (B-Natriuretic Peptide) (83880-BNPR) Est. Patient Level IV (60454)  Medications Added to Medication List This Visit: 1)  Systane 0.4-0.3 % Soln (Polyethyl glycol-propyl glycol) .[name].. 1 drop each eye two times a day  Patient Instructions: 1)  Keep legs elevated , TEDS hose during day, take off at night 2)  Low salt diet.  3)  Continue on  Furosemide -Lasix 20 mg 1-2 tabs once daily   4)  follow up Dr. Kriste Basque in April as planned and as needed  5)  Please contact office for sooner follow up if symptoms do not improve or worsen  6)  STop Mobic

## 2010-12-04 NOTE — Progress Notes (Signed)
Summary: needs prescription for generic Zocor   Phone Note Call from Patient Call back at Home Phone [phone]   Caller: Patient Call For: nadel Summary of Call: Patient phoned stated that Dr Dagoberto Ligas her thyroid doctor retired and she had her records from his office transfered to Dr Kriste Basque. She stated that she is out of her Zocor 20 mg and she needs a new prescription [facility] [address]. Patient can be reached at [phone] Initial call taken by: Vedia Coffer,  November 26, 2010 2:00 PM  Follow-up for Phone Call        Spoke with pt. She is requesting refill on simvastatin-90 day supply sen to her pharm per her request.  Follow-up by: Vernie Murders,  November 26, 2010 5:08 PM    Prescriptions: SIMVASTATIN 20 MG TABS (SIMVASTATIN) Take 1 tab by mouth at bedtime  #90 x 1   Entered by:   Vernie Murders   Authorized by:   Michele Mcalpine MD   Signed by:   Vernie Murders on 11/26/2010   Method used:   [facility]ronically to        [facility]  [address] [facility] [address] [phone]* (retail)       [address]       [place], [place]  [address]       Ph: [phone] or [phone]       Fax: [phone]   RxID:   [id number]

## 2010-12-11 ENCOUNTER — Telehealth: Payer: Self-pay | Admitting: Pulmonary Disease

## 2010-12-11 NOTE — Telephone Encounter (Signed)
Spoke with Leigh about an OV time/date. Will look at [facility] schedule and advise of OV then we can call the patient.

## 2010-12-11 NOTE — Telephone Encounter (Signed)
Called and spoke with pt and she is aware per SN to increase her lasix to 3 daily---elevate her feet which pt stated that she has been doing along with low salt diet---appt with SN on 3-28 at 10:30 and pt is aware

## 2010-12-11 NOTE — Telephone Encounter (Signed)
Called and spoke with pt. She was last seen by TP on 11/19/10 for edema. She states no better.  Rt leg is swollen and warm to touch and both feet are swollen.  She is taking lasix 20 mg 2 qd. Requesting appt with SN.  Does not want to see TP. Pls advise thanks!

## 2010-12-11 NOTE — Telephone Encounter (Signed)
Per Dr. Kriste Basque  >rec to increase Lasix 20mg  3 po qd  Low salt diet and elevate legs  Set up ov next week with Dr. Kriste Basque  Per him.  Please contact office for sooner follow up if symptoms do not improve or worsen or seek emergency care

## 2010-12-18 ENCOUNTER — Telehealth: Payer: Self-pay | Admitting: Pulmonary Disease

## 2010-12-18 ENCOUNTER — Encounter: Payer: Self-pay | Admitting: *Deleted

## 2010-12-18 NOTE — Telephone Encounter (Signed)
Forwarded to Dr. Nadel for review. °

## 2010-12-19 ENCOUNTER — Encounter: Payer: Self-pay | Admitting: Pulmonary Disease

## 2010-12-19 ENCOUNTER — Ambulatory Visit (INDEPENDENT_AMBULATORY_CARE_PROVIDER_SITE_OTHER)
Admission: RE | Admit: 2010-12-19 | Discharge: 2010-12-19 | Disposition: A | Discharge status: Home / Self Care | Payer: Medicare Other | Source: Ambulatory Visit | Attending: Pulmonary Disease | Admitting: Pulmonary Disease

## 2010-12-19 ENCOUNTER — Other Ambulatory Visit (HOSPITAL_COMMUNITY): Payer: Self-pay | Admitting: Pulmonary Disease

## 2010-12-19 ENCOUNTER — Ambulatory Visit (INDEPENDENT_AMBULATORY_CARE_PROVIDER_SITE_OTHER): Payer: Medicare Other | Admitting: Pulmonary Disease

## 2010-12-19 ENCOUNTER — Other Ambulatory Visit (INDEPENDENT_AMBULATORY_CARE_PROVIDER_SITE_OTHER): Payer: Medicare Other

## 2010-12-19 DIAGNOSIS — K219 Gastro-esophageal reflux disease without esophagitis: Secondary | ICD-10-CM

## 2010-12-19 DIAGNOSIS — N259 Disorder resulting from impaired renal tubular function, unspecified: Secondary | ICD-10-CM

## 2010-12-19 DIAGNOSIS — E7800 Pure hypercholesterolemia, unspecified: Secondary | ICD-10-CM

## 2010-12-19 DIAGNOSIS — I10 Essential (primary) hypertension: Secondary | ICD-10-CM

## 2010-12-19 DIAGNOSIS — R609 Edema, unspecified: Secondary | ICD-10-CM

## 2010-12-19 DIAGNOSIS — R0989 Other specified symptoms and signs involving the circulatory and respiratory systems: Secondary | ICD-10-CM

## 2010-12-19 DIAGNOSIS — M1990 Unspecified osteoarthritis, unspecified site: Secondary | ICD-10-CM

## 2010-12-19 DIAGNOSIS — E039 Hypothyroidism, unspecified: Secondary | ICD-10-CM

## 2010-12-19 DIAGNOSIS — I872 Venous insufficiency (chronic) (peripheral): Secondary | ICD-10-CM

## 2010-12-19 LAB — CBC WITH DIFFERENTIAL/PLATELET
Basophils Absolute: 0 10*3/uL (ref 0.0–0.1)
Eosinophils Absolute: 0.1 10*3/uL (ref 0.0–0.7)
HCT: 36.7 % (ref 36.0–46.0)
Hemoglobin: 12.3 g/dL (ref 12.0–15.0)
Lymphs Abs: 1.6 10*3/uL (ref 0.7–4.0)
MCHC: 33.5 g/dL (ref 30.0–36.0)
Monocytes Relative: 6.7 % (ref 3.0–12.0)
Neutro Abs: 5 10*3/uL (ref 1.4–7.7)
Platelets: 309 10*3/uL (ref 150.0–400.0)
RDW: 14.1 % (ref 11.5–14.6)

## 2010-12-19 LAB — BASIC METABOLIC PANEL
BUN: 31 mg/dL — ABNORMAL HIGH (ref 6–23)
CO2: 32 mEq/L (ref 19–32)
Calcium: 9.7 mg/dL (ref 8.4–10.5)
Creatinine, Ser: 1.4 mg/dL — ABNORMAL HIGH (ref 0.4–1.2)
Glucose, Bld: 91 mg/dL (ref 70–99)

## 2010-12-19 LAB — TSH: TSH: 2.81 u[IU]/mL (ref 0.35–5.50)

## 2010-12-19 LAB — HEPATIC FUNCTION PANEL
AST: 15 U/L (ref 0–37)
Albumin: 3.7 g/dL (ref 3.5–5.2)
Alkaline Phosphatase: 67 U/L (ref 39–117)
Bilirubin, Direct: 0.1 mg/dL (ref 0.0–0.3)
Total Protein: 6.7 g/dL (ref 6.0–8.3)

## 2010-12-19 MED ORDER — FUROSEMIDE 40 MG PO TABS: ORAL_TABLET | ORAL | Status: DC

## 2010-12-19 MED ORDER — ALPRAZOLAM 0.25 MG PO TABS: ORAL_TABLET | ORAL | Status: DC

## 2010-12-19 MED ORDER — HYDROCODONE-ACETAMINOPHEN 5-500 MG PO TABS: ORAL_TABLET | ORAL | Status: DC

## 2010-12-19 MED ORDER — DICLOFENAC SODIUM 75 MG PO TBEC: 75.0000 mg | DELAYED_RELEASE_TABLET | Freq: Two times a day (BID) | ORAL | Status: DC

## 2010-12-19 NOTE — Patient Instructions (Signed)
Today we updated your med list>    We refilled your Xanax (Alprazolam) & Vicodin (Hydrocodone)...    We changed the Lasix (Furosemide) to 40mg  tabs- 2 tabs in the AM...    We added Voltaren 75mg  twice daily for arthritis pain but use this sparingly & only intermittently...  We did your follow up CXR & blood work today...    We will call you with these reports when available...  We will also arrange for a 2DEchocardiogram (at the [facility] office) to check your heart pump, and we will arrange for a consultation w/ the Vascular specialists at [facility]" to see if there is anything that they can do for the swelling in your legs...  Let's plan a follow up appt in 1 month for a recheck.Marland KitchenMarland Kitchen

## 2010-12-19 NOTE — Progress Notes (Signed)
Subjective:    Patient ID: [name], female    DOB: [birth year], 77 y.o.   MRN: [id number]  HPI 77 y/o WF here for a follow up visit... she has mult med problems including:  Hx asthmatic bronchitis;  HBP;  Ven Insuffic & edema;  Hyperchol;  Hypothyroid;  GERD/ Divertics/ Colon polyps;  Renal insuffic;  DJD;  Vit D defic;  Anxiety...   ~  October 15, 2010:  she went to ER 1/17 w/ epig pain x several hrs- resolved spont & no recurrence... they did CTAbd (bilat renal cysts & bilat adnexal lesions- left is larger than prev> prev eval/ followed by DrMcPhail); and labs w/ BUN=56, Creat=2.67... given Flagyl & told to f/u here> we will decr her diuretic Rx to Demadex 20mg /d (stop Aldactone) & reinforce no salt, elevation, support hose to rx edema...  ~  December 19, 2010:  2 month ROV & add-on for incr swelling in the legs right>left;  Known venous insuffic & supposedly on low sodium diet, elevation, support hose, & Lasix  20mg - 2am & 1pm currently;  She had neg venous dopplers w/o DVT etc;  f/u labs today w/ BUN=31, Creat=1.4 (improved) & we decided to try incr to 40mg Bid & refer to [facility] for their opinion;  We will check 2DEchocardiogram as well ==> pending.    AB>  On Mucinex 1-2 Bid w/ fluids;  Exacerbations treated w/ antibiotics, prednisone, tussionex when needed;  CXR today showed NAD (no edema), prob granuloma, incr markings, & ?LLL nodular opacity will be followed up...    HBP>  Controlled on Diltiazem, Lasix, & BP= 132/66;  Denies CP, palpit, dizzy, ch in SOB;  +edema r>L as noted above...    Chol>  On Zocor 20mg /d from [employer];  He has followed her FLPs but is retiring & she has requested that we follow up her labs in the future...    Hypothyroid>  On Synthroid 125mg - 1/2 tab daily;  Labs today showed TSH= 2.81, stable, continue same...    Renal insuffic>  Renal func improved w/ stopping demadex/ aldactone (changed to Lasix now at 60mg /d w/ BUN 31, Creat 1.4);  We decided to incr to 40mg  Bid due  to her edema & we will watch the renal function (note: BNP= 71)...   Past Medical History  Diagnosis Date  . Hypertension   . Hypothyroidism   . Glaucoma   . Acute bronchitis   . Palpitations   . Unspecified venous (peripheral) insufficiency   . Pure hypercholesterolemia   . GERD (gastroesophageal reflux disease)   . Diverticulosis of colon (without mention of hemorrhage)   . Benign neoplasm of colon   . Renal insufficiency   . DJD (degenerative joint disease)   . Vitamin D deficiency disease   . Anxiety state, unspecified     Past Surgical History  Procedure Date  . Cataract extraction   . Abdominal hysterectomy   . Breast biopsy     Benign  . Total knee arthroplasty 1999    right  . Total knee arthroplasty 11/2008    left - Dr Ophelia Charter  . Toe amputation 08/2009    Right second toe - Dr Lestine Box    Outpatient Encounter Prescriptions as of 12/19/2010  Medication Sig Dispense Refill  . ALPRAZolam (XANAX) 0.25 MG tablet Take 1/2 to 1 tablet By mouth three times a day as needed for nerves  90 tablet  5  . Ascorbic Acid (VITAMIN C) 500 MG tablet Take 500 mg by  mouth daily.        [name] aspirin 81 MG tablet Take 81 mg by mouth daily.        . Cholecalciferol (VITAMIN D) 1000 UNITS capsule Take 1,000 Units by mouth daily.        [name] diltiazem (CARTIA XT) 180 MG 24 hr capsule Take 180 mg by mouth daily.        . fluorometholone (FML) 0.1 % ophthalmic suspension One drop in right eye at bedtime       . guaiFENesin (MUCINEX) 600 MG 12 hr tablet Take 1,200 mg by mouth 2 (two) times daily.        [name] HYDROcodone-acetaminophen (VICODIN) 5-500 MG per tablet Take 1/2 to 1 By mouth three times a day as needed for pain  90 tablet  5  . levothyroxine (SYNTHROID, LEVOTHROID) 125 MCG tablet Take 1/2 tablet By mouth once daily       . omeprazole (PRILOSEC) 20 MG capsule Take 20 mg by mouth daily.        Bertram Gala Glycol-Propyl Glycol (SYSTANE) 0.4-0.3 % SOLN One drop in each eye two times a day         . simvastatin (ZOCOR) 20 MG tablet Take 20 mg by mouth at bedtime.        . timolol (TIMOPTIC) 0.5 % ophthalmic solution Place 1 drop into both eyes 2 (two) times daily.        [name] DISCONTD: ALPRAZolam (XANAX) 0.25 MG tablet Take 1/2 to 1 tablet By mouth three times a day as needed for nerves       . DISCONTD: furosemide (LASIX) 20 MG tablet Take 1/2 By mouth once daily for swelling       . DISCONTD: HYDROcodone-acetaminophen (VICODIN) 5-500 MG per tablet Take 1/2 to 1 By mouth three times a day as needed for pain       . diclofenac (VOLTAREN) 75 MG EC tablet Take 1 tablet (75 mg total) by mouth 2 (two) times daily.  60 tablet  2  . furosemide (LASIX) 40 MG tablet Take 2 tablets by mouth every morning  60 tablet  5    Allergies  Allergen Reactions  . Clarithromycin     REACTION: causes her mouth to swell  . Codeine     Makes pt nervous  . Morphine     REACTION: n/v  . Sulfonamide Derivatives     REACTION: unsure of reaction    Review of Systems        See HPI       The patient complains of dyspnea on exertion, peripheral edema, muscle weakness, and difficulty walking.  The patient denies anorexia, fever, weight loss, weight gain, vision loss, decreased hearing, hoarseness, chest pain, syncope, prolonged cough, headaches, hemoptysis, abdominal pain, melena, hematochezia, severe indigestion/heartburn, hematuria, incontinence, genital sores, suspicious skin lesions, transient blindness, depression, unusual weight change, abnormal bleeding, enlarged lymph nodes, and angioedema.     Objective:   Physical Exam      WD, WN, 77 y/o WF in NAD... GENERAL:  Alert & oriented; pleasant & cooperative... HEENT:  [place]/AT, EOM- full, EACs-clear, TMs-wnl, NOSE-clear, THROAT-clear & wnl. NECK:  Supple w/ fairROM; no JVD; normal carotid impulses w/o bruits; no thyromegaly or nodules palpated; no lymphadenopathy. CHEST:  Clear, decr BS bilat w/o wheezing, rales, or rhonchi heard... HEART:  Regular  Rhythm;  gr 1/6 SEM without rubs or gallops detected... ABDOMEN:  Soft & nontender; normal bowel sounds; no organomegaly or masses detected.  EXT:  moderate arthritic changes; s/p bilat TKRs; mild varicose veins/ +venous insuffic/ 2+edema R>L Right leg sl red/ inflammed, s/p right second toe distal amputation... NEURO:  CN's intact;  no focal neuro deficits... DERM:  No lesions noted; no rash etc...   Assessment & Plan:

## 2010-12-20 ENCOUNTER — Ambulatory Visit (HOSPITAL_COMMUNITY): Payer: Medicare Other | Attending: Pulmonary Disease | Admitting: Radiology

## 2010-12-20 DIAGNOSIS — R002 Palpitations: Secondary | ICD-10-CM | POA: Insufficient documentation

## 2010-12-20 DIAGNOSIS — E785 Hyperlipidemia, unspecified: Secondary | ICD-10-CM | POA: Insufficient documentation

## 2010-12-20 DIAGNOSIS — I10 Essential (primary) hypertension: Secondary | ICD-10-CM | POA: Insufficient documentation

## 2010-12-20 DIAGNOSIS — R609 Edema, unspecified: Secondary | ICD-10-CM

## 2010-12-20 DIAGNOSIS — I059 Rheumatic mitral valve disease, unspecified: Secondary | ICD-10-CM | POA: Insufficient documentation

## 2010-12-20 DIAGNOSIS — R0609 Other forms of dyspnea: Secondary | ICD-10-CM | POA: Insufficient documentation

## 2010-12-20 DIAGNOSIS — R0989 Other specified symptoms and signs involving the circulatory and respiratory systems: Secondary | ICD-10-CM | POA: Insufficient documentation

## 2010-12-21 ENCOUNTER — Telehealth: Payer: Self-pay | Admitting: *Deleted

## 2010-12-21 NOTE — Telephone Encounter (Signed)
Called and spoke with pt about her lab results per SN---normal cbc, normal chems with stable mild renal insuff, creat 1.4, stable, bnp normal--normal fluid status, cxr ok  No fluid/lungs--rec good deep breathing  --we increased the lasix 40mg   2 every morning, no salt, elevate her legs and warm soaks and ted hose while up.   We were to set up appt with [facility] and pt has appt for this on 4-10 and she is aware

## 2010-12-23 ENCOUNTER — Encounter: Payer: Self-pay | Admitting: Pulmonary Disease

## 2010-12-23 NOTE — Assessment & Plan Note (Signed)
Followed by drYates for Ortho>  On Mobic, Vicodin as needed.Marland KitchenMarland Kitchen

## 2010-12-23 NOTE — Assessment & Plan Note (Signed)
She notes incr swelling & some erythema right leg> left leg;  Recent ven dopplers were neg for DVT & diuretic regimen adjusted;  She knows to elim all sodium, elevate, wear support hose & we will incr diuretic to Lasix 40mg  Bid... Refer to [facility] for their input/ help.Marland KitchenMarland Kitchen

## 2010-12-23 NOTE — Assessment & Plan Note (Signed)
BP controlled on Diltiazem & her diuretic therapy;  BP= 132/66, continue same.Marland KitchenMarland Kitchen

## 2010-12-23 NOTE — Assessment & Plan Note (Signed)
Renal function improved off the demadex + Aldactone regimen & now on Lasix 20mg - taking 2am & 1pm... We decided to incr to 40mg  bid due to her edema & we will watch the RFTs.Marland KitchenMarland Kitchen

## 2010-12-23 NOTE — Assessment & Plan Note (Signed)
She has been stable on Zocor 20mg  /d per drGegick;  We will plan f/u FLP on return.[name]

## 2010-12-25 ENCOUNTER — Encounter: Payer: Self-pay | Admitting: Pulmonary Disease

## 2010-12-27 ENCOUNTER — Other Ambulatory Visit: Payer: Self-pay | Admitting: Pulmonary Disease

## 2010-12-27 DIAGNOSIS — Z8679 Personal history of other diseases of the circulatory system: Secondary | ICD-10-CM

## 2010-12-27 DIAGNOSIS — R609 Edema, unspecified: Secondary | ICD-10-CM

## 2010-12-27 DIAGNOSIS — I10 Essential (primary) hypertension: Secondary | ICD-10-CM

## 2011-01-01 ENCOUNTER — Encounter (INDEPENDENT_AMBULATORY_CARE_PROVIDER_SITE_OTHER): Payer: Medicare Other | Admitting: Vascular Surgery

## 2011-01-01 DIAGNOSIS — I83893 Varicose veins of bilateral lower extremities with other complications: Secondary | ICD-10-CM

## 2011-01-01 NOTE — Consult Note (Signed)
NEW PATIENT CONSULTATION  [name], [name] S DOB:  [birth year]                                       01/01/2011 ZOXWR#:[id number]  Patient presents today for evaluation of bilateral lower extremity swelling.  She is very pleasant, active, alert 77 year old white female with progressively severe bilateral lower extremity swelling over the past several months.  She reports that she always had some difficulty with leg swelling, but over the past several months this has become markedly progressive.  She does have an extensive past medical history with bilateral total knee replacements in the past several years.  She does have a history of hypertension and elevated cholesterol.  She does have mild renal insufficiency.  She did recently have a 2-D echocardiogram and is to see Dr. Tenny Craw in 1 week for discussion of this. She does not recall being told of any severe valvular dysfunction.  She does have a remote history of a right leg venous stasis ulcer on her right lateral ankle that was healed with Unna boot therapy.  SOCIAL HISTORY:  She is retired, widowed, with 1 child.  She does not smoke or drink alcohol.  She does have a history of premature atherosclerotic disease in her mother and father.  REVIEW OF SYSTEMS:  No weight loss or gain.  She does have pain in her legs with walking. CARDIAC:  Shortness of breath with lying flat and palpitations. GI:  Reflux. MUSCULOSKELETAL:  Arthritis, otherwise negative.  PHYSICAL EXAMINATION:  A well-developed and well-nourished white female appearing younger than stated age of 73.  Blood pressure is 124/84, pulse 76, respirations 16.  She is in no acute distress.  HEENT is normal.  Her abdomen is soft, nontender.  No masses noted. Musculoskeletal:  No major deformity.  She does have scars from her prior knee surgery bilaterally.  Neurologic:  No focal weakness or paresthesias.  Skin without ulcers or rashes.  She does have  skin changes of hemosiderin deposit in both ankles, more so on her right than on her left.  She does have marked pitting edema bilaterally.  I imaged her saphenous veins with handheld SonoSite.  This does show enlargement in her saphenous vein.  I could not image her deep system.  I discussed this at length with patient and her niece present.  I have recommended that we obtain a formal duplex to rule out any valvular compromise that should cause symptoms.  I explained the significance of the superficial or deep venous insufficiency and the treatment for each of these.  She understands and will see [place] again in 1-2 weeks for follow- up of her duplex.    Larina Earthly, M.D. Electronically Signed  TFE/MEDQ  D:  01/01/2011  T:  01/01/2011  Job:  [id number]  cc:   Lonzo Cloud. Kriste Basque, MD Pricilla Riffle, MD, [facility]

## 2011-01-03 LAB — BASIC METABOLIC PANEL
BUN: 29 mg/dL — ABNORMAL HIGH (ref 6–23)
BUN: 33 mg/dL — ABNORMAL HIGH (ref 6–23)
BUN: 36 mg/dL — ABNORMAL HIGH (ref 6–23)
CO2: 26 mEq/L (ref 19–32)
Calcium: 8.5 mg/dL (ref 8.4–10.5)
Calcium: 8.7 mg/dL (ref 8.4–10.5)
Chloride: 100 mEq/L (ref 96–112)
Creatinine, Ser: 1.57 mg/dL — ABNORMAL HIGH (ref 0.4–1.2)
Creatinine, Ser: 1.6 mg/dL — ABNORMAL HIGH (ref 0.4–1.2)
Creatinine, Ser: 2.22 mg/dL — ABNORMAL HIGH (ref 0.4–1.2)
GFR calc Af Amer: 25 mL/min — ABNORMAL LOW (ref >60)
GFR calc Af Amer: 37 mL/min — ABNORMAL LOW (ref >60)
GFR calc non Af Amer: 21 mL/min — ABNORMAL LOW (ref >60)
GFR calc non Af Amer: 31 mL/min — ABNORMAL LOW (ref >60)
Glucose, Bld: 148 mg/dL — ABNORMAL HIGH (ref 70–99)

## 2011-01-03 LAB — CBC
Hemoglobin: 8.9 g/dL — ABNORMAL LOW (ref 12.0–15.0)
MCHC: 33.4 g/dL (ref 30.0–36.0)
MCHC: 34 g/dL (ref 30.0–36.0)
MCHC: 34 g/dL (ref 30.0–36.0)
MCHC: 33.7 g/dL (ref 30.0–36.0)
MCV: 89.1 fL (ref 78.0–100.0)
MCV: 88.9 fL (ref 78.0–100.0)
Platelets: 138 10*3/uL — ABNORMAL LOW (ref 150–400)
Platelets: 170 10*3/uL (ref 150–400)
Platelets: 254 10*3/uL (ref 150–400)
RBC: 2.95 MIL/uL — ABNORMAL LOW (ref 3.87–5.11)
RBC: 3.34 MIL/uL — ABNORMAL LOW (ref 3.87–5.11)
RBC: 4.41 MIL/uL (ref 3.87–5.11)
RDW: 13.9 % (ref 11.5–15.5)
RDW: 13.9 % (ref 11.5–15.5)
RDW: 14.1 % (ref 11.5–15.5)
WBC: 11.3 10*3/uL — ABNORMAL HIGH (ref 4.0–10.5)

## 2011-01-03 LAB — URINALYSIS, ROUTINE W REFLEX MICROSCOPIC
Glucose, UA: NEGATIVE mg/dL
Ketones, ur: NEGATIVE mg/dL
Nitrite: NEGATIVE
Protein, ur: NEGATIVE mg/dL
Protein, ur: NEGATIVE mg/dL
Specific Gravity, Urine: 1.009 (ref 1.005–1.030)
Urobilinogen, UA: 0.2 mg/dL (ref 0.0–1.0)
Urobilinogen, UA: 0.2 mg/dL (ref 0.0–1.0)

## 2011-01-03 LAB — PROTIME-INR
INR: 1.1 (ref 0.00–1.49)
INR: 1.9 — ABNORMAL HIGH (ref 0.00–1.49)
INR: 2.6 — ABNORMAL HIGH (ref 0.00–1.49)
INR: 0.9 (ref 0.00–1.49)
Prothrombin Time: 22.8 seconds — ABNORMAL HIGH (ref 11.6–15.2)
Prothrombin Time: 24.8 seconds — ABNORMAL HIGH (ref 11.6–15.2)
Prothrombin Time: 29.6 seconds — ABNORMAL HIGH (ref 11.6–15.2)
Prothrombin Time: 12.7 seconds (ref 11.6–15.2)

## 2011-01-03 LAB — COMPREHENSIVE METABOLIC PANEL
ALT: 14 U/L (ref 0–35)
AST: 16 U/L (ref 0–37)
CO2: 29 mEq/L (ref 19–32)
Calcium: 9.6 mg/dL (ref 8.4–10.5)
Creatinine, Ser: 1.69 mg/dL — ABNORMAL HIGH (ref 0.4–1.2)
GFR calc Af Amer: 34 mL/min — ABNORMAL LOW (ref >60)
GFR calc non Af Amer: 28 mL/min — ABNORMAL LOW (ref >60)
Sodium: 140 mEq/L (ref 135–145)
Total Protein: 6.9 g/dL (ref 6.0–8.3)

## 2011-01-03 LAB — DIFFERENTIAL
Eosinophils Relative: 1 % (ref 0–5)
Lymphocytes Relative: 27 % (ref 12–46)
Lymphs Abs: 2.4 10*3/uL (ref 0.7–4.0)
Monocytes Relative: 5 % (ref 3–12)

## 2011-01-03 LAB — TYPE AND SCREEN
ABO/RH(D): A POS
Antibody Screen: NEGATIVE

## 2011-01-07 ENCOUNTER — Encounter: Payer: Self-pay | Admitting: Internal Medicine

## 2011-01-07 ENCOUNTER — Ambulatory Visit (INDEPENDENT_AMBULATORY_CARE_PROVIDER_SITE_OTHER): Payer: Medicare Other | Admitting: Internal Medicine

## 2011-01-07 DIAGNOSIS — I517 Cardiomegaly: Secondary | ICD-10-CM

## 2011-01-07 DIAGNOSIS — R609 Edema, unspecified: Secondary | ICD-10-CM

## 2011-01-07 DIAGNOSIS — I5189 Other ill-defined heart diseases: Secondary | ICD-10-CM

## 2011-01-07 DIAGNOSIS — R002 Palpitations: Secondary | ICD-10-CM

## 2011-01-07 DIAGNOSIS — I359 Nonrheumatic aortic valve disorder, unspecified: Secondary | ICD-10-CM | POA: Insufficient documentation

## 2011-01-07 DIAGNOSIS — I519 Heart disease, unspecified: Secondary | ICD-10-CM

## 2011-01-07 NOTE — Patient Instructions (Addendum)
Your physician recommends that you return for lab work [id number] 01/08/11 at [facility] lab. Your physician has recommended that you wear an event monitor. Event monitors are medical devices that record the heart's electrical activity. Doctors most often [place] these monitors to diagnose arrhythmias. Arrhythmias are problems with the speed or rhythm of the heartbeat. The monitor is a small, portable device. You can wear one while you do your normal daily activities. This is usually used to diagnose what is causing palpitations/syncope (passing out).

## 2011-01-07 NOTE — Progress Notes (Signed)
Patient is a 77 year old with a history of HTN.  She was referred by Rodman Pickle for evaluation of abnormal echo. The patient was doing well until the end of January when she developed signif LE swelling.  Lasix was increased with a rise in her Cr. She is now on 80 Lasix with continued edema. Echocardium showed moderate LVH. LVEF was normal with mild diastolic dysfunction.  There AV had a tissue density that was suspicious for Lambl's excrescence, could not exclude healed vegetation. The patient denies fevers.  No chest pains.  She does have palpitations that occur rel frequently.   Associated with SOB but no chest pain.   She was seen by T Early and is due to go back for LE dopplers  Note LE doppler iarlier this year showed no DVT. HPI  Allergies  Allergen Reactions  . Clarithromycin     REACTION: causes her mouth to swell  . Codeine     Makes pt nervous  . Morphine     REACTION: n/v  . Sulfonamide Derivatives     REACTION: unsure of reaction    Current Outpatient Prescriptions  Medication Sig Dispense Refill  . ALPRAZolam (XANAX) 0.25 MG tablet Take 1/2 to 1 tablet By mouth three times a day as needed for nerves  90 tablet  5  . Ascorbic Acid (VITAMIN C) 500 MG tablet Take 500 mg by mouth daily.        [name] aspirin 81 MG tablet Take 81 mg by mouth daily.        . Cholecalciferol (VITAMIN D) 1000 UNITS capsule Take 1,000 Units by mouth daily.        . diclofenac (VOLTAREN) 75 MG EC tablet Take 1 tablet (75 mg total) by mouth 2 (two) times daily.  60 tablet  2  . diltiazem (CARTIA XT) 180 MG 24 hr capsule Take 180 mg by mouth daily.        . fluorometholone (FML) 0.1 % ophthalmic suspension One drop in right eye at bedtime       . furosemide (LASIX) 40 MG tablet Take 2 tablets by mouth every morning  60 tablet  5  . guaiFENesin (MUCINEX) 600 MG 12 hr tablet Take 1,200 mg by mouth 2 (two) times daily.        [name] HYDROcodone-acetaminophen (VICODIN) 5-500 MG per tablet Take 1/2 to 1 By mouth  three times a day as needed for pain  90 tablet  5  . levothyroxine (SYNTHROID, LEVOTHROID) 125 MCG tablet Take 1/2 tablet By mouth once daily       . omeprazole (PRILOSEC) 20 MG capsule Take 20 mg by mouth daily.        Bertram Gala Glycol-Propyl Glycol (SYSTANE) 0.4-0.3 % SOLN One drop in each eye two times a day       . simvastatin (ZOCOR) 20 MG tablet Take 20 mg by mouth at bedtime.        . timolol (TIMOPTIC) 0.5 % ophthalmic solution Place 1 drop into both eyes 2 (two) times daily.          Past Medical History  Diagnosis Date  . Hypertension   . Hypothyroidism   . Glaucoma   . Acute bronchitis   . Palpitations   . Unspecified venous (peripheral) insufficiency   . Pure hypercholesterolemia   . GERD (gastroesophageal reflux disease)   . Diverticulosis of colon (without mention of hemorrhage)   . Benign neoplasm of colon   . Renal  insufficiency   . DJD (degenerative joint disease)   . Vitamin D deficiency disease   . Anxiety state, unspecified     Past Surgical History  Procedure Date  . Cataract extraction   . Abdominal hysterectomy   . Breast biopsy     Benign  . Total knee arthroplasty 1999    right  . Total knee arthroplasty 11/2008    left - Dr Ophelia Charter  . Toe amputation 08/2009    Right second toe - Dr Lestine Box    Family History  Problem Relation Age of Onset  . Heart disease Mother   . Cancer Father     Throat  . Heart disease Father     History   Social History  . Marital Status: Widowed    Spouse Name: N/A    Number of Children: 1  . Years of Education: N/A   Occupational History  .     Social History Main Topics  . Smoking status: Never Smoker   . Smokeless tobacco: Never Used  . Alcohol Use: No  . Drug Use: No  . Sexually Active: Not on file   Other Topics Concern  . Not on file   Social History Narrative  . No narrative on file    Review of Systems:  All systems reviewed.  They are negative to the above problem except as previously  stated.  Vital Signs: BP 142/58  Pulse 83  Resp 18  Ht 5\' 4"  (1.626 m)  Wt 158 lb 6.4 oz (71.85 kg)  BMI 27.19 kg/m2  Physical Exam  HEENT:  Normocephalic, atraumatic. EOMI, PERRLA.  Neck: JVP is normal. No thyromegaly. No bruits.  Lungs: clear to auscultation. No rales no wheezes.  Heart: Regular rate and rhythm. Normal S1, S2. No S3.   No significant murmurs. PMI not displaced.  Abdomen:  Supple, nontender. Normal bowel sounds. No masses. No hepatomegaly.  Extremities:   Good distal pulses throughout. 2 to 3 + lower extremity edema.  Musculoskeletal :moving all extremities.  Neuro:   alert and oriented x3.  CN II-XII grossly intact.  EKG:  NSR  83 bpm.   Assessment and Plan:

## 2011-01-08 ENCOUNTER — Other Ambulatory Visit (INDEPENDENT_AMBULATORY_CARE_PROVIDER_SITE_OTHER): Payer: Medicare Other

## 2011-01-08 DIAGNOSIS — I519 Heart disease, unspecified: Secondary | ICD-10-CM

## 2011-01-08 DIAGNOSIS — I517 Cardiomegaly: Secondary | ICD-10-CM

## 2011-01-08 DIAGNOSIS — I5189 Other ill-defined heart diseases: Secondary | ICD-10-CM

## 2011-01-08 DIAGNOSIS — R002 Palpitations: Secondary | ICD-10-CM

## 2011-01-08 DIAGNOSIS — R609 Edema, unspecified: Secondary | ICD-10-CM

## 2011-01-08 LAB — BASIC METABOLIC PANEL
CO2: 30 mEq/L (ref 19–32)
Glucose, Bld: 83 mg/dL (ref 70–99)
Potassium: 4.8 mEq/L (ref 3.5–5.1)
Sodium: 133 mEq/L — ABNORMAL LOW (ref 135–145)

## 2011-01-08 NOTE — Progress Notes (Signed)
Addended by: Julieta Gutting on: 01/08/2011 12:06 PM   Modules accepted: Orders

## 2011-01-09 LAB — ANTI-NUCLEAR AB-TITER (ANA TITER)

## 2011-01-14 ENCOUNTER — Ambulatory Visit: Payer: Self-pay | Admitting: Pulmonary Disease

## 2011-01-16 ENCOUNTER — Ambulatory Visit (INDEPENDENT_AMBULATORY_CARE_PROVIDER_SITE_OTHER): Payer: Medicare Other | Admitting: Vascular Surgery

## 2011-01-16 ENCOUNTER — Encounter (INDEPENDENT_AMBULATORY_CARE_PROVIDER_SITE_OTHER): Payer: Medicare Other

## 2011-01-16 DIAGNOSIS — I83893 Varicose veins of bilateral lower extremities with other complications: Secondary | ICD-10-CM

## 2011-01-16 DIAGNOSIS — M7989 Other specified soft tissue disorders: Secondary | ICD-10-CM

## 2011-01-17 NOTE — Assessment & Plan Note (Signed)
OFFICE VISIT  ANALUISA, TUDOR DOB:  [birth year]                                       01/16/2011 ZOXWR#:[id number]  Patient presents today for continuous discussion regarding her lower extremity swelling.  I had seen her for initial visit on January 01, 2011. She has bilateral lower extremity swelling, worse on the right than on the left, but relatively extensive with pitting edema bilaterally.  She also has some Teagan Ozawa stasis dermatitis on the right leg as well.  She underwent a formal venous duplex today and is seen today for further discussion.  She is here today with her daughter.  Her duplex reveals reflux in the right deep system.  No reflux in the left deep system.  She does have reflux in her right great saphenous vein and no reflux on the left saphenous system.  Her physical exam is otherwise unchanged.  Blood pressure today is 154/54, heart rate 79, respirations 18.  Her lower extremities are unchanged with marked edema bilaterally.  I discussed the significance of her duplex with patient and her daughter present.  The only treatment option would be ablation of her right great saphenous vein.  Due to the bilateral edema and due to the presence of deep venous reflux on the right as well, I feel that this would give her minimal, if any improvement.  I have recommended that we continue her compression, and I plan to see her again on an as-needed basis.  She apparently does have some renal insufficiency, therefore, making aggressive diuresis somewhat risky.  She will continue to follow-up with Dr. Kriste Basque and see [place] on a p.r.n. basis.    Larina Earthly, M.D. Electronically Signed  TFE/MEDQ  D:  01/16/2011  T:  01/17/2011  Job:  [id number]  cc:   Lonzo Cloud. Kriste Basque, MD

## 2011-01-23 NOTE — Procedures (Unsigned)
LOWER EXTREMITY VENOUS REFLUX EXAM  INDICATION:  Varicose veins.  EXAM:  Using color-flow imaging and pulse Doppler spectral analysis, the right and left common femoral, superficial femoral, popliteal, posterior tibial, greater and lesser saphenous veins are evaluated.  There is evidence suggesting deep venous insufficiency in the right lower extremity.  There is no evidence suggesting deep venous insufficiency in the left lower extremity.  The right saphenofemoral junction is not competent with Reflux of >551milliseconds. The left saphenofemoral junction is competent.  The right GSV is not competent with Reflux of >557milliseconds with the caliber as described below.  The left GSV is competent.  The right and left proximal small saphenous vein demonstrates competency.  GSV Diameter (used if found to be incompetent only)                                           Right    Left Proximal Greater Saphenous Vein           0.73 cm  cm Proximal-to-mid-thigh                     0.69 cm  cm Mid thigh                                 0.53 cm  cm Mid-distal thigh                          cm       cm Distal thigh                              0.57 cm  cm Knee                                      cm       cm  IMPRESSION: 1. The right great saphenous vein is not competent with reflux     >561milliseconds. 2. The left greater saphenous vein is competent. 3. The right and left greater saphenous veins are not tortuous. 4. The deep venous system is not competent with Reflux of     >557milliseconds. 5. The right and left small saphenous vein is competent.        ___________________________________________ Larina Earthly, M.D.  OD/MEDQ  D:  01/16/2011  T:  01/17/2011  Job:  [id number]

## 2011-01-28 ENCOUNTER — Encounter: Payer: Self-pay | Admitting: Pulmonary Disease

## 2011-01-30 ENCOUNTER — Other Ambulatory Visit (INDEPENDENT_AMBULATORY_CARE_PROVIDER_SITE_OTHER): Payer: Medicare Other

## 2011-01-30 ENCOUNTER — Ambulatory Visit (INDEPENDENT_AMBULATORY_CARE_PROVIDER_SITE_OTHER): Payer: Medicare Other | Admitting: Pulmonary Disease

## 2011-01-30 DIAGNOSIS — F411 Generalized anxiety disorder: Secondary | ICD-10-CM

## 2011-01-30 DIAGNOSIS — K219 Gastro-esophageal reflux disease without esophagitis: Secondary | ICD-10-CM

## 2011-01-30 DIAGNOSIS — E039 Hypothyroidism, unspecified: Secondary | ICD-10-CM

## 2011-01-30 DIAGNOSIS — I10 Essential (primary) hypertension: Secondary | ICD-10-CM

## 2011-01-30 DIAGNOSIS — E7800 Pure hypercholesterolemia, unspecified: Secondary | ICD-10-CM

## 2011-01-30 DIAGNOSIS — M25559 Pain in unspecified hip: Secondary | ICD-10-CM

## 2011-01-30 DIAGNOSIS — M1990 Unspecified osteoarthritis, unspecified site: Secondary | ICD-10-CM

## 2011-01-30 DIAGNOSIS — N259 Disorder resulting from impaired renal tubular function, unspecified: Secondary | ICD-10-CM

## 2011-01-30 DIAGNOSIS — I872 Venous insufficiency (chronic) (peripheral): Secondary | ICD-10-CM

## 2011-01-30 DIAGNOSIS — M549 Dorsalgia, unspecified: Secondary | ICD-10-CM

## 2011-01-30 DIAGNOSIS — R609 Edema, unspecified: Secondary | ICD-10-CM

## 2011-01-30 LAB — BASIC METABOLIC PANEL
CO2: 33 mEq/L — ABNORMAL HIGH (ref 19–32)
Calcium: 9.2 mg/dL (ref 8.4–10.5)
Creatinine, Ser: 1.8 mg/dL — ABNORMAL HIGH (ref 0.4–1.2)

## 2011-01-30 MED ORDER — HYDROXYZINE HCL 10 MG PO TABS: 10.0000 mg | ORAL_TABLET | ORAL | Status: AC | PRN

## 2011-01-30 MED ORDER — BACIT-POLY-NEO HC 1 % EX OINT: TOPICAL_OINTMENT | CUTANEOUS | Status: DC

## 2011-01-30 NOTE — Progress Notes (Signed)
Subjective:    Patient ID: [name], female    DOB: 1914/11/11, 77 y.o.   MRN: [id number]  HPI  77 y/o WF here for a follow up visit... she has mult med problems including:  Hx asthmatic bronchitis;  HBP;  Ven Insuffic & edema;  Hyperchol;  Hypothyroid;  GERD/ Divertics/ Colon polyps;  Renal insuffic;  DJD;  Vit D defic;  Anxiety...   ~  October 15, 2010:  she went to ER 1/17 w/ epig pain x several hrs- resolved spont & no recurrence... they did CTAbd (bilat renal cysts & bilat adnexal lesions- left is larger than prev> prev eval/ followed by DrMcPhail); and labs w/ BUN=56, Creat=2.67... given Flagyl & told to f/u here> we will decr her diuretic Rx to Demadex 20mg /d (stop Aldactone) & reinforce no salt, elevation, support hose to rx edema...  ~  December 19, 2010:  2 month ROV & add-on for incr swelling in the legs right>left;  Known venous insuffic & supposedly on low sodium diet, elevation, support hose, & Lasix  20mg - 2am & 1pm currently;  She had neg venous dopplers w/o DVT etc;  f/u labs today w/ BUN=31, Creat=1.4 (improved) & we decided to try incr to 40mg Bid & refer to VVS for their opinion;  We will check 2DEchocardiogram as well ==> modLVH, normLVF w/ EF=60-65% & norm wall motion, Gr 1 DD, abn AoV w/ ?Lambl's excrescence ==> sent to Cards for review.    AB>  On Mucinex 1-2 Bid w/ fluids;  Exacerbations treated w/ antibiotics, prednisone, tussionex when needed;  CXR today showed NAD (no edema), prob granuloma, incr markings, & ?LLL nodular opacity will be followed up...    HBP>  Controlled on Diltiazem, Lasix, & BP= 132/66;  Denies CP, palpit, dizzy, ch in SOB;  +edema r>L as noted above...    Chol>  On Zocor 20mg /d from [employer];  He has followed her FLPs but is retiring & she has requested that we follow up her labs in the future...    Hypothyroid>  On Synthroid 125mg - 1/2 tab daily;  Labs today showed TSH= 2.81, stable, continue same...    Renal insuffic>  Renal func improved w/  stopping demadex/ aldactone (changed to Lasix now at 60mg /d w/ BUN 31, Creat 1.4);  We decided to incr to 40mg  Bid due to her edema & we will watch the renal function (note: BNP= 71)...  ~  Jan 30, 2011:  6wk ROV & she was seen by Nunzio Cory for Cards 4/12> she did some blood work w/ Creat=1.8, Low titer ANA, Sed=53, & Event Recorder pending ("there was nothing life threatening" she says)...  She also saw DrEarly 4/12 for [facility]> VenDuplex confirmed reflux in deep sys on right & ablation of right GSV wouldn't help- she reports that there is nothing that they can do & he rec continued Lasix, elevation, no salt, compression hose, & f/u prn...  She complains of legs itching & we discussed a topical hydrocort cream + Atarax as needed...   Her CC today is pain in hips & legs making it hard for her to rest at night> she was prev eval by DrYates- he sent her for shots by DrNewton (no benefit she says) so he wanted her to return to DrYates but he's said there is nothing he can do> they did not offer review by Rheumatologist or Pain clinic assessment (we will proceed w/ consult from drDeveshwar)...   Problem List:  GLAUCOMA (ICD-365.9) - hx mult eye problems  w/ prev corneal transplant & glaucoma laser eye surg... on eye drops per Ophthalmology at [facility]...  ASTHMATIC BRONCHITIS, ACUTE (ICD-466.0) - Hx AB requiring Rx w/ Depo/ Pred/ Mucinex/ Tussionex... ~  CXR 4/11 showed vague nodular densities on left... ~  8/11:  she went to an [facility] w/ cough, sputum, & dx w/ pneumonia rx w/ ZPak...  HYPERTENSION (ICD-401.9) - controlled on ASA 81mg /d,  CARTIAXT 180mg /d, LASIX 40mg - 2Qam... BP= 142/62 and she is tol meds well... denies HA, fatigue, visual changes, CP, palipit, dizziness, syncope, dyspnea, etc... sl incr edema noted... ~  7/10:  labs showed BUN=54, Creat=2.0 & rec to decr diuretics in half= one Demedex & 1/2 Aldactone. ~  11/10:  labs showed BUN=38, Creat=1.8 & rec to keep same meds. ~  8/11:  labs showed  BUN=42, Creat=2.1, K=4.3 ~  1/12:  labs in ER showed BUN=56, Creat-2.67, therefore diuretics decr to Demadex20/d only. ~  5/12:  f/u labs in office showed BUN=45, Creat=1.8, on Lasix 80mg  Qam for her edema...  PALPITATIONS, HX OF (ICD-V12.50)  VENOUS INSUFFICIENCY (ICD-459.81) - swelling controlled w/ diuretics, low sodium diet, elevation, support hose, etc...  HYPERCHOLESTEROLEMIA (ICD-272.0) - prev Rx'd by DrGegick... she tells me he switched her to ZOCOR 20mg /d from the prev Crestor5 & Lescol that she has been on for years... she was intol to Lipitor due to muscle aching... she says she has to battle her insurance company for meds that DrG wants. ~  she brought labs from DrG 5/10 (off med due to $$$) w/ TChol 285, TG 204, HDL 69, LDL 195... "he was mad at me" ~  FLP 5/11 from [facility] showed TChol 202, TG 60, HDL 112, LDL 99  HYPOTHYROIDISM (ICD-244.9) - prev followed by DrGegick on SYNTHROID - 1/2 daily... ~  pt brought labs from [facility] 5/10 w/ TSH= 2.90, FreeT4= 1.09 ~  labs 8/11 showed TSH= 1.87  GERD (ICD-530.81) - uses PRILOSEC 20mg /d... last EGD was 6/06 by DrPatterson showing 3cmHH, reflux...   DIVERTICULOSIS OF COLON (ICD-562.10) & COLONIC POLYPS (ICD-211.3) - last colonoscopy 6/06 was WNL- no abnormality seen...  RENAL INSUFFICIENCY (ICD-588.9) - tough balance betw renal perfusion & diuresis for edema... ~  labs 10/08 showed BUN= 27, Creat= 1.5 ~  labs 8/09 showed BUN= 44, Creat= 1.9 ~  labs 2/10 showed BUN 46, Creat= 1.7 ~  labs 7/10 showed BUN= 54, Creat= 2.0 ~  labs 11/10 showed BUN= 38, Creat= 1.8 ~  labs 8/11 showed BUN= 42, Creat= 2.1 ~  labs in ER 1/12 showed BUN=56, Creat=2.67, rec decr diuretics to Demadex20mg /d only. ~  Labs 3/12 showed improved renal function w/ BUN=31, Creat=1.4... ~  Labs 5/12 on Lasix80 showed BUN=45, Creat=1.8  DEGENERATIVE JOINT DISEASE (ICD-715.90) - this is her chief complaint- s/p right TKR in 1999,  left TKR 3/10... on MOBIC 7.5mg  Prn  & VICODIN Prn as well... followed by DrYates; and had right second toe amp by Volney Presser 2010 for hammertoe + spurs... also had epidural steroid shot from [facility] for LBP (without benefit she says)...  VITAMIN D DEFICIENCY (ICD-268.9) ~  labs 8/09 showed Vit D level = 12... rec- start Vit D 50,000 u weekly... ~  labs 2/10 showed Vit D level = 30... rec- continue Vit D 50K weekly... ~  labs 7/10 showed Vit D level = 39... rec- change to 1000 u OTC daily. ~  labs 8/11 showed Vit D level = 38... continue same.  ANXIETY (ICD-300.00) - on ALPRAZOLAM 0.25mg Tid Prn... several deaths  in the family... daughter who lives w/ her recently ill...  SHINGLES - she had right T9-10 shingles in 2011 & resolved w/ Valtrex, Pred, etc...   Past Surgical History  Procedure Date  . Cataract extraction   . Abdominal hysterectomy   . Breast biopsy     Benign  . Total knee arthroplasty 1999    right  . Total knee arthroplasty 11/2008    left - Dr Ophelia Charter  . Toe amputation 08/2009    Right second toe - Dr Lestine Box    Outpatient Encounter Prescriptions as of 01/30/2011  Medication Sig Dispense Refill  . ALPRAZolam (XANAX) 0.25 MG tablet Take 1/2 to 1 tablet By mouth three times a day as needed for nerves  90 tablet  5  . Ascorbic Acid (VITAMIN C) 500 MG tablet Take 500 mg by mouth daily.        [name] aspirin 81 MG tablet Take 81 mg by mouth daily.        . Cholecalciferol (VITAMIN D) 1000 UNITS capsule Take 1,000 Units by mouth daily.        . diclofenac (VOLTAREN) 75 MG EC tablet Take 1 tablet (75 mg total) by mouth 2 (two) times daily.  60 tablet  2  . diltiazem (CARTIA XT) 180 MG 24 hr capsule Take 180 mg by mouth daily.        . fluorometholone (FML) 0.1 % ophthalmic suspension One drop in right eye at bedtime       . furosemide (LASIX) 40 MG tablet Take 2 tablets by mouth every morning  60 tablet  5  . guaiFENesin (MUCINEX) 600 MG 12 hr tablet Take 1,200 mg by mouth 2 (two) times daily.        [name]  HYDROcodone-acetaminophen (VICODIN) 5-500 MG per tablet Take 1/2 to 1 By mouth three times a day as needed for pain  90 tablet  5  . levothyroxine (SYNTHROID, LEVOTHROID) 125 MCG tablet Take 1/2 tablet By mouth once daily       . omeprazole (PRILOSEC) 20 MG capsule Take 20 mg by mouth daily.        Bertram Gala Glycol-Propyl Glycol (SYSTANE) 0.4-0.3 % SOLN One drop in each eye two times a day       . simvastatin (ZOCOR) 20 MG tablet Take 20 mg by mouth at bedtime.        . timolol (TIMOPTIC) 0.5 % ophthalmic solution Place 1 drop into both eyes 2 (two) times daily.          Allergies  Allergen Reactions  . Clarithromycin     REACTION: causes her mouth to swell  . Codeine     Makes pt nervous  . Morphine     REACTION: n/v  . Sulfonamide Derivatives     REACTION: unsure of reaction    Review of Systems        See HPI The patient complains of dyspnea on exertion, peripheral edema, muscle weakness, and difficulty walking.  The patient denies anorexia, fever, weight loss, weight gain, vision loss, decreased hearing, hoarseness, chest pain, syncope, prolonged cough, headaches, hemoptysis, abdominal pain, melena, hematochezia, severe indigestion/heartburn, hematuria, incontinence, genital sores, suspicious skin lesions, transient blindness, depression, unusual weight change, abnormal bleeding, enlarged lymph nodes, and angioedema.    Objective:   Physical Exam      WD, WN, 77 y/o WF in NAD... GENERAL:  Alert & oriented; pleasant & cooperative... HEENT:  [place]/AT, EOM- full, EACs-clear, TMs-wnl, NOSE-clear, THROAT-clear &  wnl. NECK:  Supple w/ fairROM; no JVD; normal carotid impulses w/o bruits; no thyromegaly or nodules palpated; no lymphadenopathy. CHEST:  Clear, decr BS bilat w/o wheezing, rales, or rhonchi heard... HEART:  Regular Rhythm;  gr 1/6 SEM without rubs or gallops detected... ABDOMEN:  Soft & nontender; normal bowel sounds; no organomegaly or masses detected. EXT:  moderate  arthritic changes; s/p bilat TKRs; mild varicose veins/ +venous insuffic/ 2+edema R>L Right leg sl red/ inflammed, s/p right second toe distal amputation... NEURO:  CN's intact;  no focal neuro deficits... DERM:  No lesions noted; no rash etc...   Assessment & Plan:   HBP>  Controlled on meds, continue same...  Cardiac>  DrRoss' note reviewed; see 2DEcho, Event Monitor pending, continue meds...  Ven Insuffic/ EDEMA>  evals by Cards & VVS> "there is nothing they can do"; continue elevation, no salt, Lasix, compression...  CHOL>  On Simva20 per DrGegick...  HYPOTHYROID>  Continue Synthroid 141mcg/d taking 1/2 tab per DrGegick...  GI>  Stable & followed by DrPatterson...  Renal Insuffic>  Careful w/ diuresis, NSAIDs etc...  DJD>  We will refer to DrDeveshwar for her Rheum complaints; DrYates/ Alvester Morin have signed off; may need pain clinic....  Other problems as noted.Marland KitchenMarland Kitchen

## 2011-01-30 NOTE — Patient Instructions (Addendum)
Today we updated your med list in EPIC...    We wrote a new prescription for CORTISPORIN OINTMENT for your legs...    And HYDROXYZINE for itching (take 1-2 tabs every 4H as needed)...  Today we did your follow up blood work to check your renal function on the Lasix...    Please call the PHONE TREE in a few days for your results...    Dial [id number] & when prompted enter your patient number followed by the # symbol...    Your patient number is:  [id number]#  We also checked w/ DrRoss' office & you should be hearing form "Windell Moulding" about the monitor...  Finally we decided to refer you to a Rheumatologist to see if they can help you w/ your arthritis & chronic pain problem...  Call for any questions...  Let's plan a follow up visit in 6-8 weeks to be sure you are doing OK.Marland KitchenMarland Kitchen

## 2011-02-01 ENCOUNTER — Encounter (INDEPENDENT_AMBULATORY_CARE_PROVIDER_SITE_OTHER): Payer: Medicare Other

## 2011-02-01 DIAGNOSIS — R002 Palpitations: Secondary | ICD-10-CM

## 2011-02-05 NOTE — Discharge Summary (Signed)
NAMECHARNELE, [name]            ACCOUNT NO.:  [id number]   MEDICAL RECORD NO.:  [id number]          PATIENT TYPE:  INP   LOCATION:  5031                         FACILITY:  [facility]   PHYSICIAN:  Mark C. Ophelia Charter, M.D.    DATE OF BIRTH:  1915-02-16   DATE OF ADMISSION:  12/05/2008  DATE OF DISCHARGE:  12/09/2008                               DISCHARGE SUMMARY   ADMISSION DIAGNOSES:  1. Osteoarthritis of the left knee.  2. 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  3. Glaucoma.  4. Hypothyroidism.  5. Gastroesophageal reflux disease.  6. Asthma.  7. History of pulmonary embolism 1985.   DISCHARGE DIAGNOSES:  1. Osteoarthritis of the left knee.  2. 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  3. Glaucoma.  4. Hypothyroidism.  5. Gastroesophageal reflux disease.  6. Asthma.  7. History of pulmonary embolism 1985.  8. Acute blood loss anemia.  9. Mild hyponatremia.  10.Mild renal insufficiency.   PROCEDURE:  On December 05, 2008, the patient underwent left total knee  arthroplasty, computer-assisted, performed by Dr. Ophelia Charter, assisted by  Maud Deed, PA-C, under general anesthesia with left femoral nerve  block.   CONSULTS:  None.   BRIEF HISTORY:  The patient is a 77 year old female with chronic and  progressive left knee pain which has failed conservative treatment  including intra-articular steroid injections, physical therapy and anti-  inflammatory medications.  Radiographs have shown tricompartmental  osteoarthritis changes of the left knee.  She is being limited with her  activities secondary to her pain.  She had a previous knee replacement  on the right approximately 10-11 years ago and has done well following  that replacement.  It was felt she would benefit from surgical  intervention and was admitted for the procedure as stated above.   BRIEF HOSPITAL COURSE:  The patient tolerated the procedure under  general anesthesia, with a left  femoral nerve block, without  complications.  Postoperatively, neurovascular and motor function of the  lower extremities was noted to be intact.  Patient was started on  Coumadin for DVT and PE prophylaxis postoperatively.  Adjustments in  Coumadin dose were made according to daily prothrombin times by the  pharmacist at [facility].  On the day of discharge INR was noted to  be 2.1.  Her last dose of Coumadin was 2.5 mg.  The patient's Foley  catheter was discontinued and she was able to void without difficulty.  She did have some mild constipation which was treated with stool  softeners and laxative of choice.  The patient was started on physical  therapy for ambulation and gait training as well as range of motion,  stretching and strengthening exercises.  Prior to discharge she was  ambulating 60 feet.  CPM was utilized for passive range of motion.  She  was taught active range of motion and exercises as well.  Dressing  changes were done daily and patient's wound was found to be healing  without drainage.  She did have quite a bit of ecchymosis around the  knee and posterior leg.  This  did appear to be stable with no increasing  swelling during the hospital stay while on blood thinners.  Patient had  low-grade fever during the hospital stay and was treated with incentive  spirometry.  Urinalysis performed on December 08, 2008, was negative for  urinary tract infection.  Her last BMET was noted to have sodium 132,  potassium 4.9, chloride 100, CO2 24, glucose 142, BUN 33 and creatinine  2.22, calcium 8.4.  Her admission BUN and creatinine was noted to be 51  and 1.69 respectively.  The patient's CBC on December 08, 2008, showed WBC  10.7, which was an improvement from the previous day where WBC had been  11.3.  Hemoglobin and hematocrit on last check, on October 18, was noted  to be 8.9 and 26.5.  Chest x-ray preoperatively showed no active  cardiopulmonary disease.  Postoperative x-ray of  the left knee showed  good position and alignment following left total knee arthroplasty.  Patient was afebrile, vital signs were stable.  She was taking a regular  diet and felt stable for discharge to a nursing facility for  continuation of her rehab on December 09, 2008.  Case manager assisted with  the procedure of finding a skilled nursing facility and patient and  family decided on [facility].   PLAN:  The patient will be transferred to [facility] where she should  continue with rehabilitation, progressive ambulation, weightbearing as  tolerated using a walker.  Range of motion exercises to the left knee.  Active range of motion as well as strengthening and stretching  exercises.  She should also utilize the CPM machine for passive range of  motion, ranging as high as 0-90 degrees when patient tolerates.  This  may be increased in increments of 10-15 degrees daily, using the CPM  approximately 6-8 hours per day.  Dressing change can be done as needed.  The patient is allowed to shower.  Knee immobilizer to be worn when  patient is ambulatory only.  She does not need the knee immobilizer when  she is at bedrest or seated in the recliner.  Occupational therapy for  ADLs as patient does plan to return home independent.  She will continue  on a regular diet.  Follow up with Dr. Ophelia Charter 2 weeks from the date of  surgery or as patient already has scheduled.   MEDICATIONS AT DISCHARGE:  1. Cardizem CD 180 mg p.o. daily.  2. Demadex 40 mg daily.  3. Aldactone 25 mg daily.  4. Vitamin D 50,000 units every Wednesday.  5. Protonix 40 mg daily.  6. Colace 1 p.o. 2 times daily.  7. Synthroid 125 mcg daily.  8. Crestor 5 mg daily.  9. Timoptic eye drops 0.5% solution 1 drop to right eye twice a day.  10.Fluorometholone 0.1% ophthalmic solution 1drop to right eye      nightly.  11.Trinsicon 1 p.o. 2 times daily.  12.Xanax 0.25 mg q.8 h p.r.n. anxiety.  13.Laxative of choice.  14.Enema of  choice.  15.Robaxin 500 mg 1 every 8 hours as needed for spasm.  16.Percocet 5/325 one to two every 4-6 hours as needed for pain.  17.Patient should continue on Coumadin for DVT prophylaxis.      Adjustments in her Coumadin dose should be      made by the physician at the facility.  Her last Coumadin dose 2.5      mg on December 09, 2008, which was not given prior to her discharge.  It was scheduled to be given at 1800 hours.   CONDITION ON DISCHARGE:  Stable.      Wende Neighbors, P.A.      Mark C. Ophelia Charter, M.D.  Electronically Signed    SMV/MEDQ  D:  12/09/2008  T:  12/09/2008  Job:  [id number]

## 2011-02-05 NOTE — Op Note (Signed)
[name], [name]            ACCOUNT NO.:  [id number]   MEDICAL RECORD NO.:  [id number]          PATIENT TYPE:  INP   LOCATION:  5031                         FACILITY:  [facility]   PHYSICIAN:  Mark C. Ophelia Charter, M.D.    DATE OF BIRTH:  Jul 30, 1915   DATE OF PROCEDURE:  12/05/2008  DATE OF DISCHARGE:                               OPERATIVE REPORT   PREOPERATIVE DIAGNOSIS:  Left knee osteoarthritis.   POSTOPERATIVE DIAGNOSIS:  Left knee osteoarthritis.   PROCEDURE:  Left total knee arthroplasty, cemented, computer assist.   SURGEON:  Mark C. Ophelia Charter, MD   ASSISTANT:  Wende Neighbors, PA-C   ANESTHESIA:  GOT.   ESTIMATED BLOOD LOSS:  Minimal.   TOURNIQUET TIME:  55 minutes x350.   PROCEDURE:  After induction of general anesthesia, orotracheal  intubation, preoperative femoral block for postoperative pain control,  proximal thigh tourniquet application, standard prep and drape with  DuraPrep to usual extremity with split sheets, drapes, impervious  stockinette, Coban to the mid tibial level.  Sterile skin marker and  Betadine Vi-drape were used to seal the skin.  Preoperative Ancef was  given.  Surgical checklist was completed intraoperative portion.  Ancef  was given prophylactically.  A midline incision was made.  Superficial  retinaculum was divided.  Quad tendon was split between the medial one-  third and lateral two-thirds.  Patella was everted and cut 10 mm off the  patella sizing for 35.  Spurs removed off the femur.  There was bone-on-  bone changes in lateral compartments as well as medial femoral condyle  and patellofemoral groove.  __computer bicortical femoral and  tibial________ pins were placed.  Stab incisions on the mid tibia just  above the stockinette, separated appropriate amount for fixation of the  3 laser balls.  Femoral pins were placed inside the femur bicortical up  in the suprapatellar region so that it be out of the prosthesis.  Bicortical fixation was  placed and then center of the head was made  followed by creation of the tibial model and femoral model.  Sizing was  2.5.  Computer initialization showed the patient was in 11 degrees  flexion contracture and 2.7 degrees of valgus.  A 9 mm removed off  behind the femur.  There was no femoral bow.  Anterior and posterior  cuts were made based on the 2.5 recommended by the computer.  Tibial cut  was made removing 8 off the high and 6 off below and was slightly higher  lateral, this was adjusted with the saw.  Menisci were resected.  There  was some spurs removed posteriorly off the femur.  The base blocks were  checked.  It was 1.5 mm tighter on the lateral aspect and medial turns  out there was some small amount of bone left posterolateral which had to  be removed and then the difference was less than a millimeter.  Extension that was exactly balanced at gaps of 0.4 and 0.5 mm medial and  lateral.  Pulsatile lavage was used after box cut was made on the femur,  keel cuts were made on  the tibia with appropriate rotation based on the  tibial tubercle.  DePuy 2.5 femur PFC rotating platform cruciate  substituting prosthesis was selected, 2.5 tibial component, 10-mm spacer  gave 1 degree of hyperextension, symmetrical flexion/extension blocks  and extension and less than a millimeter difference in flexion with the  left side being slightly tighter.  Pulsatile lavage was used __________  mixing of the cement, cementing of the tibial component first followed  by femur, then insertion of 10-mm poly spacer, and 35-mm patella.  All  excessive cement was removed.  Collateral ligaments were stable, gutters  were checked for any excessive cement.  Cement was hard at 14 minutes.  Tourniquet deflated.  Tourniquet time was 55 minutes.  Hemostasis was  obtained with Bovie electrocautery.  Repeat irrigation and then standard  closure with #1 nonabsorbable braided sutures in the quad tendon and  capsule,  2-0 Vicryl in subcutaneous tissue, 2-0 in the superficial  retinaculum as well, and then 4-0 Vicryl subcuticular skin closure.  Tincture of Benzoin, Steri-Strips, postop dressing, and knee  immobilizers applied.  Instrument count and needle count were correct.  Half-pins were removed just prior to placement of the cemented  prosthesis and after the cement hardened, flexion/extension gaps were  rechecked manually and felt good with good balance or good patellar  tracking with 35-mm patella and femoral component was with lugs.       Mark C. Ophelia Charter, M.D.  Electronically Signed     MCY/MEDQ  D:  12/05/2008  T:  12/06/2008  Job:  [id number]

## 2011-02-08 NOTE — Discharge Summary (Signed)
NAME:  [name], [name]                      ACCOUNT NO.:  [id number]   MEDICAL RECORD NO.:  [id number]                   PATIENT TYPE:  INP   LOCATION:  4731                                 FACILITY:  [facility]   PHYSICIAN:  Lonzo Cloud. Kriste Basque, M.D. [facility]            DATE OF BIRTH:  1915-08-02   DATE OF ADMISSION:  04/23/2003  DATE OF DISCHARGE:  05/03/2003                                 DISCHARGE SUMMARY   FINAL DIAGNOSES:  1. Hemorrhagic and hypovolemic shock secondary to bleeding leg varix and     over-anticoagulation.  2. Chest wall pain secondary to cardiopulmonary resuscitation at home.  3. Renal insufficiency -- improved with fluid resuscitation.  4. Anemia -- status post multiple-unit transfusion.  5. History of hypertension.  6. History of cardiac arrhythmia.  7. History of pulmonary emboli in 1986.  8. History of hypercholesterolemia.  9. History of hiatus hernia and gastroesophageal reflux disease.  10.      History of colon polyps and diverticulosis.  11.      History of venous insufficiency and varicose veins with previous     cellulitis and dermatitis in the lower extremities.  12.      History of degenerative arthritis with previous right total knee     replacement.  13.      History of hyperthyroidism in the past, treated by Dr. Alfonse Alpers.     [name] and now on Synthroid replacement.  14.      History of anxiety.  15.      History of glaucoma, previous cataract surgery, and corneal     transplant.  16.      Status post vaginal hysterectomy.  17.      History of right breast nodule in the past.   BRIEF HISTORY AND PHYSICAL:  The patient is an 77 year old white female  known to me from office followups.  She was admitted on April 23, 2003 by Dr.  Casimiro Needle B. Wert of the critical care service when she presented to the  emergency room in shock.  Report by EMS indicated she had a bleeding varix  on her leg that oozed over a period of time and soaked the bed sheets.  She  attempted to walk and passed out.  Relatives performed CPR at home and  called EMS.  Her blood pressure was 60 when first checked.  She did not  report any chest pain.  She was transported to the emergency room, where she  was evaluated and started on fluid resuscitation.  Dr. Sherene Sires placed a CVP  catheter with CVP of less than 5 and she was given blood transfusions.  Her  INR was 3.1 and she was given fresh frozen plasma and this was reversed.   PAST MEDICAL HISTORY:  1. She has a history of hypertension, controlled on Toprol, Hyzaar and     Lasix.  2. She has a history of cardiac arrhythmias  with ventricular tachycardia in     the past on an exercise test in 1987.  There is no history of atrial     fibrillation known.  3. She did have a history of pulmonary emboli in 1985, but the details are     unknown.  She has been on Coumadin since that time and had monthly pro     times in the office.  4. She has a history of hypercholesterolemia, on Lescol.  This is followed     by Dr. Dagoberto Ligas.  5. She has a history of hyperthyroidism with a hyperfunctioning nodule     treated with I-131 in 1994.  She still sees Dr. Dagoberto Ligas for this and is on     Synthroid replacement.  6. She has a history of hiatus hernia and gastroesophageal reflux disease     for which she takes Prilosec.  7. She has a history of colon polyps and diverticulosis, last colonoscopy in     2002 by Dr. Vania Rea. Jarold Motto.  8. She has a history of venous insufficiency and varicose veins with     previous surgery by Dr. Earvin Hansen L. [place]es[place] in 2001 and 2002.  9. She had a history of cellulitis and dermatitis in her legs in the past.  10.      She has degenerative arthritis with lumbar spondylosis at L4-L5 and     a previous right total knee replacement by Dr. Loraine Leriche C. Yates.  11.      She has a history of anxiety, on Xanax.  12.      She has a history of glaucoma, previous cataract surgery and a     corneal [facility] at [facility].  13.      She had a previous vaginal hysterectomy by Dr. Katherine Roan.  14.      She had a right breast nodule in the past which was benign.   ALLERGIES:  She is intolerant to NONSTEROIDAL ANTI-INFLAMMATORY MEDICATION  and the COX-2's.  She uses Darvocet for pain.   PHYSICAL EXAMINATION:  GENERAL:  Physical examination at time of admission  revealed an elderly white female, somewhat lethargic, lying flat in bed.  VITAL SIGNS:  Blood pressure 60 palpable, pulse 120 and regular,  respirations 18 to 20 per minutes, temperature 98 degrees.  NECK:  There was no jugular venous distention, no carotid bruits, no  thyromegaly or lymphadenopathy.  CHEST:  Exam was clear to percussion and auscultation.  CARDIAC:  Exam revealed a regular rhythm, normal S1 and S2.  No S3 gallop or  rub.  ABDOMEN:  Exam revealed mild epigastric discomfort on palpation, no guarding  or rebound, normal bowel sounds, no evidence of organomegaly.  EXTREMITIES:  Extremities showed varicose veins and some oozing from a varix  in the left leg.  NEUROLOGIC:  Exam was intact without focal abnormalities detected.   LABORATORY DATA:  EKG showed normal sinus rhythm and some nonspecific ST-T  wave changes.  There were no acute abnormalities detected.   Two-dimensional echocardiogram showed overall normal left ventricular  systolic function with ejection fraction between 55% and 65%; there were no  regional left ventricular wall motion abnormalities seen.   Chest x-ray showed clear lungs, old fracture deformity of the left 6th rib,  heart at the upper limits of normal in size and no active disease.   CT scan of the chest, abdomen and pelvis was performed on admission in the  emergency room to be  sure there was not some other etiology for her shock.  CT of the chest showed some mild dependent atelectasis, no evidence of any  hematoma.  CT of the abdomen revealed an 8-mm hyperdense lesion in the lower pole of the  right kidney, felt to be a hyperdense cyst; there was no  evidence of intra-abdominal hematoma or bleeding.  CT of the pelvis showed a  left cystic adnexal mass measuring 5.4 cm and was also negative for any  hematoma or evidence of bleeding.   Hemoglobin 9.9, hematocrit 29.6, white count 11,900 with 52% segs.  Pro time  24.4, INR of 3.1, PTT 42 seconds on admission.  Serial studies were  followed.  She received 5 units of packed cells.  Sodium 138, potassium 3.7,  chloride 104, CO2 23, BUN 34, creatinine 2.2, blood sugar 208; at discharge,  blood sugar 101, BUN 16, creatinine 1.2, calcium 8.1, total protein 5.4,  albumin 2.7.  AST 83, repeat 14; ALT 67, repeat 19; alkaline phosphatase 57;  total bilirubin 0.4; amylase 105; lipase 27.  CPK 85 with negative MB and  negative troponin.  Beta natriuretic peptide 181.  Cholesterol 153,  triglycerides 62, HDL 59, LDL 82.  TSH 1.56.  Blood type A-positive.   HOSPITAL COURSE:  The patient was admitted by Dr. Sherene Sires of the critical care  service.  She was transfused fresh frozen plasma and packed cells.  She  received a total of 6 units during the hospital stay.  Her CVP was monitored  and returned towards normal with elevation in her blood pressure.  She was  transferred out of the unit and to my service for continued care, post  resuscitation.  She was noted to have a small punctate area on her leg near  her ankle which was the source of this bleeding.  Decision was made, after  discussion with the patient, to discontinue her Coumadin therapy and follow  her clinically.  It was noted she had a history of pulmonary emboli in the  1980s but the details of this are unknown to [place].  She had been on Coumadin  since that time and carefully monitored.  There was no history of atrial  fibrillation or DVT that we were aware of.  During her hospital stay, she  had a moderate amount of left chest pain from her CPR efforts prior to  admission and some  atelectasis in the left base.  She used incentive  spirometry and gradually mobilized and improved.  She remained somewhat  tender in the chest wall but got good relief with rib binder and heat.  She  was seen by physical therapy and slowly ambulated.  Consideration was given  to [facility] transfer, but she appeared to progress well at the end and was  discharged home with home health and family support on May 03, 2003.  O2  saturation 92% on room air and 90% with ambulation.   MEDICATIONS AT DISCHARGE:  1. Cardizem CD 180 mg one p.o. daily.  2. Lasix 40 mg p.o. daily.  3. K-Dur 20 mEq one p.o. daily.  4. Lescol XL 80 mg one p.o. daily.  5. Prilosec 20 mg p.o. daily.  6. Synthroid 50 mcg p.o. daily.  7. Xanax 0.25 mg p.o. t.i.d. as needed for nerves.  8. Vicodin one tablet every four to six hours as needed for pain.  9. Niferex iron one tablet orally (p.o.) daily.   DISCHARGE INSTRUCTIONS:  She was instructed to stop her  previous Toprol and Hyzaar for the time-being.  Therapy was arranged through [facility]  for outpatient monitoring and she was given a followup appointment on May 10, 2003 at noon.   CONDITION AT DISCHARGE:  Improved.                                                Lonzo Cloud. Kriste Basque, M.D. [facility]    SMN/MEDQ  D:  06/16/2003  T:  06/18/2003  Job:  [id number]

## 2011-02-08 NOTE — Discharge Summary (Signed)
[name], [name]            ACCOUNT NO.:  [id number]   MEDICAL RECORD NO.:  [id number]          PATIENT TYPE:  INP   LOCATION:  6741                         FACILITY:  [facility]   PHYSICIAN:  Lonzo Cloud. Kriste Basque, M.D. [facility] OF BIRTH:  03-07-1915   DATE OF ADMISSION:  11/25/2005  DATE OF DISCHARGE:  12/08/2005                                 DISCHARGE SUMMARY   FINAL DIAGNOSES:  1.  Admitted November 25, 2005 via the office with right lower extremity      cellulitis, unresponsive to outpatient treatment. The patient did not      improve with initial intravenous Ancef.  An Infectious Disease consult      was obtained, with change in antibiotics to intravenous vancomycin for a      10-day course in the hospital. Cultures were all negative; however.      Clinically, the patient improved slowly with elevation, diuresis, and      intravenous vancomycin. Unna boot applied at discharge, and will be      followed up as an outpatient.  2.  Previous history of hemorrhagic and hypovolemic shock, secondary to a      bleeding right leg varix associated with Coumadin anticoagulation in      August 2004. Coumadin discontinued at that time.  3.  A history of hypertension - controlled on medication.  4.  History of cardiac arrhythmia.  5.  History of pulmonary emboli in 1986.  6.  History of hypercholesterolemia - controlled on Lescol.  7.  History of hiatal hernia and gastroesophageal reflux disease.  8.  History of diverticulosis and colon polyps.  9.  History of varicose veins and venous insufficiency, with previous      history of cellulitis in the right leg and dermatitis --  treated by Dr.      Shon Hough in the past.  10. History of degenerative arthritis; previous right total knee      replacement.  11. History of hyperthyroidism in the past --  treated with Dr. Dagoberto Ligas and      now on Synthroid replacement.  12. History of anxiety.  13. History of glaucoma, previous cataract surgery, and  corneal transplant.  14. Status post vaginal hysterectomy.  15. History of right breast nodule in the past.   BRIEF HISTORY:  The patient is an 89-year white female,  known to me from  office follow-up. She has a history of hypertension and hyperlipidemia,  which are controlled. She developed right lower extremity cellulitis after a  recent fall at church. and was treated as an outpatient with Duricef,  elevation and local care. This did not show signs of much improvement  clinically; she developed a temperature to 102, associated with increased  swelling and pain in her right leg.  She presented to the office where  admission was felt to be necessary. Venous Dopplers were done as an  outpatient, due to swelling in the right leg, and were negative for DVT. She  does have a history of pulmonary emboli in 1985.  She was on Coumadin for  many years, until in 2004 she  developed a severe bleeding varix in her leg,  associated with the Coumadin therapy -- resulting in severe anemia and  hypovolemic shock.  She recovered from that illness after a brief  hospitalization, and has been off Coumadin since that time.   PAST MEDICAL HISTORY:  As noted, the patient has a history of hypertension  controlled on medications.  She has a history of cardiac arrhythmias with  ventricular tachycardia in the past. This occurred while she was on an  exercise test in 1987, and has had no known recurrence. There is no history  of atrial fibrillation. She had a history of pulmonary emboli in 1985, and  had been on Coumadin since that time -- until at 2004 admission as above.  She has a history of hypercholesterolemia, on Lescol. She has a history of  hyperthyroidism, with a hyperfunctioning nodule treated with radioactive  iodine in 1994 by Dr. Dagoberto Ligas. She is currently taking Synthroid replacement.  She has history of hiatal hernia and gastroesophageal reflux disease, for  which she takes Prilosec. She has a  history of diverticulosis and colon  polyps; last colonoscopy was in 2002 by Dr. Eloise Harman. She has a history of  varicose veins, venous insufficiency and previous surgery by Dr. Carleene Cooper in  2001 and 2002. She has a dermatitis in her right leg, associated with the  previous cellulitis and venous disease. She has a history of degenerative  arthritis, with lumbar spondylosis at L4-L5, and a previous right total knee  replacement by Dr. Ophelia Charter. She has a history of anxiety; on Xanax. She has  glaucoma; previous cataract surgery and a corneal transplant at [facility].  She is currently on eyedrops. She had a previous vaginal hysterectomy from  Dr. Elana Alm. She had a right breast nodule in the past, which proved to be  benign.   ALLERGIES:  She is intolerant to NONSTEROIDAL ANTI-INFLAMMATORY MEDICATIONS,  and to COX-2   MEDICATIONS:  She uses Tylenol and Darvocet for pain.   PHYSICAL EXAMINATION:  Examination at the time of admission revealed an 24-  year white female in no acute distress. Blood pressure 122/68, pulse 92 per  minute and regular, respirations 18 per minute and not labored, O2  saturation 98% on room air, temperature 99.7. HEENT exam was unremarkable.  The neck exam showed no jugular distension, no thyromegaly or nodules  appreciated.  No JVD or bruits. Chest exam was clear to percussion and  auscultation. Cardiac exam revealed normal S1 and S2; without murmurs, rubs  or gallops detected. The abdomen was slightly overweight; soft, nontender,  without mass organomegaly or masses. Extremities showed intact femoral  pulses with no bruits. Legs were warm bilaterally. The right leg was  swollen. red and tender on palpation in the distal half. There was some mild  edema. There were some small excoriations noted. There was a negative  Homans' sign. Neurologic exam was intact without focal abnormalities  detected.   LABORATORY DATA:  EKG showed normal sinus rhythm, and was felt to  be within normal limits. CHEST X-RAY:  Revealed a mild cardiomegaly, clear lungs; some  minimal atelectasis at left base, no active disease. Hemoglobin 12.5,  hematocrit 36.9, white count 8600 with 67% segs. Serial CBCs were followed  throughout her hospital course.  Final hemoglobin 10.1. Sed rate elevated at  77.  A follow-up showed improvement only to 70 by the time of discharge.  This will be followed as an outpatient. Sodium 136, potassium 3.9, chloride  101, CO2  25, BUN 17, creatinine 1.5, blood sugar 135.  Calcium 88.6, total  protein 6.6, albumin 3.0, AST 17, ALT 16, alkaline phosphatase  74, total  bilirubin 0.7.  Serum protein electrophoresis was negative, with no  monoclonal protein identified. Lipid profile showed a cholesterol 143,  triglyceride 145, HDL 44, LDL 70. TSH 9.47, and her Synthroid was increased  from 50 to 75 during this hospitalization. Iron 54, TIBC 248, for a  saturation of 22%. B12 level 532. Blood cultures were negative x2.   HOSPITAL COURSE:  The patient was admitted with cellulitis of her right leg.  This appeared to occur after a fall at church with an abrasion, and it was  unresponsive to outpatient Duricef. She was placed on IV Kefzol and cultures  obtained from the blood. There were no open wounds to culture on her leg.  The cultures remained sterile. The leg did not improve with the Ancef over  the first several days of the hospitalization. An ID consult was obtained,  and the patient was seen by Dr. Ninetta Lights. He recommended switching to  vancomycin empirically and continuing for a 10-day course. Her leg was very  slow to improve on this, but there was slow and gradual improvement with  decreased erythema, decreased tenderness, etc.  The patient completed a 10-  day course of IV vancomycin in the hospital, with vancomycin levels checked  by pharmacy. The leg was cleaned and dressed in the hospital, and prior to  discharge an Unna boot was applied with  Ace wrap.  This will be followed in  the office.   During the hospitalization there were few other attendant problems. She did  have a moderate amount of pain,  and we used a Duragesic patch and removed  it prior to discharge. She had elevated TSH at 9.7, and her 50 mcg of  Synthroid was increased to 75 mcg. She had an elevated sedimentation rate,  which remained elevated during her hospital course; this will be followed as  an outpatient. She was given an empiric Medrol Dosepak, and will complete  this as an outpatient.   MEDICATIONS AT DISCHARGE:  1.  Finish Medrol Dosepak as described.  2.  Lasix 20 mg 1 tablet p.o. q.a.m.  3.  K-Dur 20, 1 tablet p.o. q.d.  4.  Cartia XT 180 p.o. q.d.  5.  Lescol XL 80 , one p.o. q.h.s.  6.  Synthroid 50 mcg tablets, 1-1/2 tablet p.o. q.d.  7.  Omeprazole 20 mg p.o. q.d.  8.  Xanax 0.5 mg 1/2 to 1 tablets p.o. t.i.d. as needed for nerves.  9.  Laxative of choice as needed.  10. Continue eye drops as at home.  11. Enteric-coated aspirin 1 tablet daily. 12. Tylenol 2 tablets every 4 hr as needed for pain.   FOLLOWUP:  The patient will follow up in the office on Thursday December 12, 2005 at 12:30 p.m.; at which time we will remove her Unna boot, check the  legs and determine whether we need to reapply it or not.   CONDITION AT DISCHARGE:  Improved.      Lonzo Cloud. Kriste Basque, M.D. [facility]  Electronically Signed     SMN/MEDQ  D:  12/08/2005  T:  12/09/2005  Job:  [id number]

## 2011-02-08 NOTE — H&P (Signed)
NAMEVERTA, RIEDLINGER            ACCOUNT NO.:  [id number]   MEDICAL RECORD NO.:  [id number]          PATIENT TYPE:  INP   LOCATION:  6741                         FACILITY:  [facility]   PHYSICIAN:  Lonzo Cloud. Kriste Basque, M.D. [facility] OF BIRTH:  11-Dec-1914   DATE OF ADMISSION:  11/25/2005  DATE OF DISCHARGE:                                HISTORY & PHYSICAL   CHIEF COMPLAINT:  Right lower extremity swelling and redness.   HISTORY OF PRESENT ILLNESS:  This is an 77 year old white female patient of  Dr. Kriste Basque with a history of hypertension and hyperlipidemia.  She returns  today for followup of right lower extremity cellulitis.  The patient was  seen in the office on November 21, 2005, after a 1-day history of right lower  extremity swelling, pain, and redness with a fever T-max of 102.  The  patient had a fall at church the previous Sunday, February 25, when she  tripped on some steps and landed on her right side. The patient had  significant bruising along the right upper arm and lower extremity.  There  was a small excoriated abrasion along the right lower extremity.  The  patient reports that she had no significant problems up until 3 days after  her fall.  She noticed some redness along the right lower leg and  significant swelling.  The patient presented to the office on March 1 and  was thought to have underlying cellulitis.  She was placed on Duricef x7  days.  She is currently on day #5 of 7.  The patient was also sent for a  venous Doppler study of the right lower extremity.  This was negative for  DVT. The patient does have a history of pulmonary embolism that occurred in  1985.  The patient returns today complaining that symptoms are not improved  and she has noticed increased redness with persistent pain and swelling.  She continues to have a low-grade fever of approximately about 100.  She  denies any chest pain, nausea, vomiting, confusion, abdominal pain.  The  patient is able to bear  weight; however, it is somewhat painful.  The  patient was able to bear weight after the fall for several days without any  difficulty.   PAST MEDICAL HISTORY:  1.  Hypertension.  2.  Hyperlipidemia.  3.  Hypothyroidism.  4.  Gastroesophageal reflux disease.  5.  Pulmonary embolism diagnosed in 1985, previously on Coumadin therapy up      until 2005 when it was discontinued due to a bleeding varicose vein with      subsequent anemia requiring transfusion.  6.  Status post total abdominal hysterectomy secondary to uterine cancer.  7.  Degenerative joint disease status post right total knee replacement.   ALLERGIES:  BIAXIN and SULFA.   CURRENT MEDICATIONS:  1.  Cartia 180 mg daily.  2.  Furosemide 20 mg 2 daily.  3.  Omeprazole 20 mg daily.  4.  Synthroid 50 mcg daily.  5.  K-Dur 20 mEq twice daily.  6.  Lescol XL 80 mg nightly.  7.  Xanax 0.5 mg  p.r.n.  8.  Timolol optic drops 1 drop to each eye q.a.m.  9.  Xalatan 0.005% eye drops 1 drop to each eye 3 times a day.  10. Aleve p.r.n.   SOCIAL HISTORY:  The patient is a widow.  Her daughter does live with her.  She denies any alcohol use.  She is retired.  She never smoked.   REVIEW OF SYSTEMS:  Essentially negative except as noted above.   PHYSICAL EXAMINATION:  GENERAL: The patient is a well-developed, elderly  female in no acute distress.  VITAL SIGNS: Temperature 99.7, blood pressure 122/68, pulse 92, O2  saturation 98% on room air.  HEENT:  Normocephalic and atraumatic.  TMs are normal.  Nasal mucosa pink  and moist.  Posterior pharynx clear without exudate.  NECK:  Supple without cervical adenopathy, no JVD, no thyromegaly or nodules  appreciated.  LUNGS:  Clear to auscultation bilaterally.  CARDIAC: S1 and S2 without murmur, rub, or gallop.  ABDOMEN: Morbidly obese, soft, without hepatosplenomegaly.  No guarding or  rebound noted.  EXTREMITIES: Femoral pulses are intact.  No bruits noted.  Extremities are  warm  bilaterally.  Pulses are intact . The patient has tenderness along the  entire mid to lower extremity.  The entire right lower extremity is swollen  with significant erythema along the mid to lower portions of the lower  extremity to the ankle.  Warm to touch.  There were several areas that were  weeping along the lateral extremity with a small excoriation.  However, this  is much improved since last visit. Negative Homan's sign.   LABORATORY DATA:  Venous Doppler done on November 22, 2005.  Preliminary report  was negative for DVT.   IMPRESSION AND PLAN:  1.  Right lower extremity injury with underlying cellulitis with failure of      outpatient antibiotics and therapy. The patient will require      hospitalization for IV antibiotics.  Labs are pending.  2.  The patient will continue home medications as prior to admission.      Rubye Oaks, NP [facility]      Scott M. Kriste Basque, M.D. [facility]  Electronically Signed    TP/MEDQ  D:  11/25/2005  T:  11/25/2005  Job:  [id number]

## 2011-02-23 ENCOUNTER — Encounter: Payer: Self-pay | Admitting: Pulmonary Disease

## 2011-02-28 ENCOUNTER — Telehealth: Payer: Self-pay | Admitting: *Deleted

## 2011-02-28 NOTE — Telephone Encounter (Signed)
Tried to reach pt to give her monitor results. No answer. Will continue to try and reach pt.

## 2011-03-01 NOTE — Telephone Encounter (Signed)
Attempted to call pt. No answer.

## 2011-03-11 NOTE — Telephone Encounter (Signed)
Called patient with results of event monitor. Advised per Dr.Ross,no changes in Medical regimen.

## 2011-03-13 ENCOUNTER — Ambulatory Visit (INDEPENDENT_AMBULATORY_CARE_PROVIDER_SITE_OTHER): Payer: Medicare Other | Admitting: Pulmonary Disease

## 2011-03-13 DIAGNOSIS — E039 Hypothyroidism, unspecified: Secondary | ICD-10-CM

## 2011-03-13 DIAGNOSIS — F411 Generalized anxiety disorder: Secondary | ICD-10-CM

## 2011-03-13 DIAGNOSIS — M1990 Unspecified osteoarthritis, unspecified site: Secondary | ICD-10-CM

## 2011-03-13 DIAGNOSIS — I872 Venous insufficiency (chronic) (peripheral): Secondary | ICD-10-CM

## 2011-03-13 DIAGNOSIS — I10 Essential (primary) hypertension: Secondary | ICD-10-CM

## 2011-03-13 DIAGNOSIS — N259 Disorder resulting from impaired renal tubular function, unspecified: Secondary | ICD-10-CM

## 2011-03-13 DIAGNOSIS — K219 Gastro-esophageal reflux disease without esophagitis: Secondary | ICD-10-CM

## 2011-03-13 DIAGNOSIS — K5730 Diverticulosis of large intestine without perforation or abscess without bleeding: Secondary | ICD-10-CM

## 2011-03-13 DIAGNOSIS — E7800 Pure hypercholesterolemia, unspecified: Secondary | ICD-10-CM

## 2011-03-13 MED ORDER — LEVOTHYROXINE SODIUM 125 MCG PO TABS: ORAL_TABLET | ORAL | Status: DC

## 2011-03-13 MED ORDER — DILTIAZEM HCL ER COATED BEADS 180 MG PO CP24: 180.0000 mg | ORAL_CAPSULE | Freq: Every day | ORAL | Status: DC

## 2011-03-13 NOTE — Progress Notes (Signed)
Subjective:    Patient ID: [name], female    DOB: [birth year], 77 y.o.   MRN: [id number]  HPI  77 y/o WF here for a follow up visit... she has mult med problems including:  Hx asthmatic bronchitis;  HBP;  Ven Insuffic & edema;  Hyperchol;  Hypothyroid;  GERD/ Divertics/ Colon polyps;  Renal insuffic;  DJD;  Vit D defic;  Anxiety...   ~  October 15, 2010:  she went to ER 1/17 w/ epig pain x several hrs- resolved spont & no recurrence... they did CTAbd (bilat renal cysts & bilat adnexal lesions- left is larger than prev> prev eval/ followed by DrMcPhail); and labs w/ BUN=56, Creat=2.67... given Flagyl & told to f/u here> we will decr her diuretic Rx to Demadex 20mg /d (stop Aldactone) & reinforce no salt, elevation, support hose to rx edema...  ~  December 19, 2010:  2 month ROV & add-on for incr swelling in the legs right>left;  Known venous insuffic & supposedly on low sodium diet, elevation, support hose, & Lasix  20mg - 2am & 1pm currently;  She had neg venous dopplers w/o DVT etc;  f/u labs today w/ BUN=31, Creat=1.4 (improved) & we decided to try incr to 40mg Bid & refer to VVS for their opinion;  We will check 2DEchocardiogram as well ==> modLVH, normLVF w/ EF=60-65% & norm wall motion, Gr 1 DD, abn AoV w/ ?Lambl's excrescence ==> sent to Cards for review.    AB>  On Mucinex 1-2 Bid w/ fluids;  Exacerbations treated w/ antibiotics, prednisone, tussionex when needed;  CXR today showed NAD (no edema), prob granuloma, incr markings, & ?LLL nodular opacity will be followed up...    HBP>  Controlled on Diltiazem, Lasix, & BP= 132/66;  Denies CP, palpit, dizzy, ch in SOB;  +edema r>L as noted above...    Chol>  On Zocor 20mg /d from [employer];  He has followed her FLPs but is retiring & she has requested that we follow up her labs in the future...    Hypothyroid>  On Synthroid 125mg - 1/2 tab daily;  Labs today showed TSH= 2.81, stable, continue same...    Renal insuffic>  Renal func improved w/  stopping demadex/ aldactone (changed to Lasix now at 60mg /d w/ BUN 31, Creat 1.4);  We decided to incr to 40mg  Bid due to her edema & we will watch the renal function (note: BNP= 71)...  ~  Jan 30, 2011:  6wk ROV & she was seen by Nunzio Cory for Cards 4/12> she did some blood work w/ Creat=1.8, Low titer ANA, Sed=53, & Event Recorder pending ("there was nothing life threatening, nothing to worry about" she says)...  She also saw DrEarly 4/12 for VVS> VenDuplex confirmed reflux in deep sys on right & ablation of right GSV wouldn't help- she reports that there is nothing that they can do & he rec continued Lasix, elevation, no salt, compression hose, & f/u prn...  She complains of legs itching & we discussed a topical hydrocort cream + Atarax as needed...   Her CC today is pain in hips & legs making it hard for her to rest at night> she was prev eval by DrYates- he sent her for shots by DrNewton (no benefit she says) so he wanted her to return to DrYates but he's said there is nothing he can do> they did not offer review by Rheumatologist or Pain clinic assessment (we will proceed w/ consult from drDeveshwar)...  ~  March 13, 2011:  6wk  ROV & she is feeling some better, swelling has diminished w/ Lasix 80mg /d & dermatitis resolved w/ HC cream;  wt is down 10# to 149#;  We reviewed current med Rx & decided to continue same & f/u in several monthe w/ f/u labs...    She also saw DrDeveshwar last week> note pending, pt says they XRayed her hand & did blood work, called for records from [place], and has f/u 04/03/11...   Problem List:  GLAUCOMA (ICD-365.9) - hx mult eye problems w/ prev corneal transplant & glaucoma laser eye surg... on eye drops per Ophthalmology at [facility]...  ASTHMATIC BRONCHITIS, ACUTE (ICD-466.0) - Hx AB requiring Rx w/ Depo/ Pred/ Mucinex/ Tussionex... ~  CXR 4/11 showed vague nodular densities on left... ~  8/11:  she went to an [facility] w/ cough, sputum, & dx w/ pneumonia rx w/  ZPak...  HYPERTENSION (ICD-401.9) - controlled on ASA 81mg /d,  CARTIAXT 180mg /d, LASIX 40mg - 2Qam... BP= 142/62 and she is tol meds well... denies HA, fatigue, visual changes, CP, palipit, dizziness, syncope, dyspnea, etc... sl incr edema noted... ~  7/10:  labs showed BUN=54, Creat=2.0 & rec to decr diuretics in half= one Demedex & 1/2 Aldactone. ~  11/10:  labs showed BUN=38, Creat=1.8 & rec to keep same meds. ~  8/11:  labs showed BUN=42, Creat=2.1, K=4.3 ~  1/12:  labs in ER showed BUN=56, Creat-2.67, therefore diuretics decr to Demadex20/d only. ~  5/12:  f/u labs in office showed BUN=45, Creat=1.8, on Lasix 80mg  Qam for her edema...  PALPITATIONS, HX OF (ICD-V12.50)  VENOUS INSUFFICIENCY (ICD-459.81) - swelling controlled w/ diuretics, low sodium diet, elevation, support hose, etc...  HYPERCHOLESTEROLEMIA (ICD-272.0) - prev Rx'd by DrGegick... she tells me he switched her to ZOCOR 20mg /d from the prev Crestor5 & Lescol that she has been on for years... she was intol to Lipitor due to muscle aching... she says she has to battle her insurance company for meds that DrG wants. ~  she brought labs from DrG 5/10 (off med due to $$$) w/ TChol 285, TG 204, HDL 69, LDL 195... "he was mad at me" ~  FLP 5/11 from [facility] showed TChol 202, TG 60, HDL 112, LDL 99  HYPOTHYROIDISM (ICD-244.9) - prev followed by DrGegick on SYNTHROID - 1/2 daily... ~  pt brought labs from [facility] 5/10 w/ TSH= 2.90, FreeT4= 1.09 ~  labs 8/11 showed TSH= 1.87  GERD (ICD-530.81) - uses PRILOSEC 20mg /d... last EGD was 6/06 by DrPatterson showing 3cmHH, reflux...   DIVERTICULOSIS OF COLON (ICD-562.10) & COLONIC POLYPS (ICD-211.3) - last colonoscopy 6/06 was WNL- no abnormality seen...  RENAL INSUFFICIENCY (ICD-588.9) - tough balance betw renal perfusion & diuresis for edema... ~  labs 10/08 showed BUN= 27, Creat= 1.5 ~  labs 8/09 showed BUN= 44, Creat= 1.9 ~  labs 2/10 showed BUN 46, Creat= 1.7 ~  labs 7/10 showed  BUN= 54, Creat= 2.0 ~  labs 11/10 showed BUN= 38, Creat= 1.8 ~  labs 8/11 showed BUN= 42, Creat= 2.1 ~  labs in ER 1/12 showed BUN=56, Creat=2.67, rec decr diuretics to Demadex20mg /d only. ~  Labs 3/12 showed improved renal function w/ BUN=31, Creat=1.4... ~  Labs 5/12 on Lasix80 showed BUN=45, Creat=1.8  DEGENERATIVE JOINT DISEASE (ICD-715.90) - this is her chief complaint- s/p right TKR in 1999,  left TKR 3/10... on MOBIC 7.5mg  Prn & VICODIN Prn as well... followed by DrYates; and had right second toe amp by Volney Presser 2010 for hammertoe + spurs... also had epidural  steroid shot from [facility] for LBP (without benefit she says)...  VITAMIN D DEFICIENCY (ICD-268.9) ~  labs 8/09 showed Vit D level = 12... rec- start Vit D 50,000 u weekly... ~  labs 2/10 showed Vit D level = 30... rec- continue Vit D 50K weekly... ~  labs 7/10 showed Vit D level = 39... rec- change to 1000 u OTC daily. ~  labs 8/11 showed Vit D level = 38... continue same.  ANXIETY (ICD-300.00) - on ALPRAZOLAM 0.25mg Tid Prn... several deaths in the family... daughter who lives w/ her recently ill...  SHINGLES - she had right T9-10 shingles in 2011 & resolved w/ Valtrex, Pred, etc...   Past Surgical History  Procedure Date  . Cataract extraction   . Abdominal hysterectomy   . Breast biopsy     Benign  . Total knee arthroplasty 1999    right  . Total knee arthroplasty 11/2008    left - Dr Ophelia Charter  . Toe amputation 08/2009    Right second toe - Dr Lestine Box    Outpatient Encounter Prescriptions as of 03/13/2011  Medication Sig Dispense Refill  . ALPRAZolam (XANAX) 0.25 MG tablet Take 1/2 to 1 tablet By mouth three times a day as needed for nerves  90 tablet  5  . Ascorbic Acid (VITAMIN C) 500 MG tablet Take 500 mg by mouth daily.        [name] aspirin 81 MG tablet Take 81 mg by mouth daily.        . bacitracin-neomycin-polymyxin b-hydrocortisone (CORTISPORIN) 1 % ointment Apply to legs two times daily as needed  15 g  5  .  Cholecalciferol (VITAMIN D) 1000 UNITS capsule Take 1,000 Units by mouth daily.        . diclofenac (VOLTAREN) 75 MG EC tablet Take 1 tablet (75 mg total) by mouth 2 (two) times daily.  60 tablet  2  . diltiazem (CARTIA XT) 180 MG 24 hr capsule Take 180 mg by mouth daily.        . fluorometholone (FML) 0.1 % ophthalmic suspension One drop in right eye at bedtime       . furosemide (LASIX) 40 MG tablet Take 2 tablets by mouth every morning  60 tablet  5  . guaiFENesin (MUCINEX) 600 MG 12 hr tablet Take 1,200 mg by mouth 2 (two) times daily.        [name] HYDROcodone-acetaminophen (VICODIN) 5-500 MG per tablet Take 1/2 to 1 By mouth three times a day as needed for pain  90 tablet  5  . levothyroxine (SYNTHROID, LEVOTHROID) 125 MCG tablet Take 1/2 tablet By mouth once daily       . omeprazole (PRILOSEC) 20 MG capsule Take 20 mg by mouth daily.        Bertram Gala Glycol-Propyl Glycol (SYSTANE) 0.4-0.3 % SOLN One drop in each eye two times a day       . simvastatin (ZOCOR) 20 MG tablet Take 20 mg by mouth at bedtime.        . timolol (TIMOPTIC) 0.5 % ophthalmic solution Place 1 drop into both eyes 2 (two) times daily.          Allergies  Allergen Reactions  . Clarithromycin     REACTION: causes her mouth to swell  . Codeine     Makes pt nervous  . Morphine     REACTION: n/v  . Sulfonamide Derivatives     REACTION: unsure of reaction    Review of Systems  See HPI The patient complains of dyspnea on exertion, peripheral edema, muscle weakness, and difficulty walking.  The patient denies anorexia, fever, weight loss, weight gain, vision loss, decreased hearing, hoarseness, chest pain, syncope, prolonged cough, headaches, hemoptysis, abdominal pain, melena, hematochezia, severe indigestion/heartburn, hematuria, incontinence, genital sores, suspicious skin lesions, transient blindness, depression, unusual weight change, abnormal bleeding, enlarged lymph nodes, and angioedema.     Objective:    Physical Exam      WD, WN, 77 y/o WF in NAD... GENERAL:  Alert & oriented; pleasant & cooperative... HEENT:  [place]/AT, EOM- full, EACs-clear, TMs-wnl, NOSE-clear, THROAT-clear & wnl. NECK:  Supple w/ fairROM; no JVD; normal carotid impulses w/o bruits; no thyromegaly or nodules palpated; no lymphadenopathy. CHEST:  Clear, decr BS bilat w/o wheezing, rales, or rhonchi heard... HEART:  Regular Rhythm;  gr 1/6 SEM without rubs or gallops detected... ABDOMEN:  Soft & nontender; normal bowel sounds; no organomegaly or masses detected. EXT:  moderate arthritic changes; s/p bilat TKRs; mild varicose veins/ +venous insuffic/ 2+edema R>L Right leg sl red/ inflammed, s/p right second toe distal amputation... NEURO:  CN's intact;  no focal neuro deficits... DERM:  No lesions noted; no rash etc...   Assessment & Plan:   HBP>  Controlled on meds, continue same...  Cardiac>  DrRoss' note reviewed; see 2DEcho, Event Monitor reports no dangerous arrhythmias, continue meds...  Ven Insuffic/ EDEMA>  evals by Cards & VVS> "there is nothing they can do"; continue elevation, no salt, Lasix, compression...  CHOL>  On Simva20 per DrGegick...  HYPOTHYROID>  Continue Synthroid 184mcg/d taking 1/2 tab per DrGegick...  GI>  Stable & followed by DrPatterson...  Renal Insuffic>  Careful w/ diuresis, NSAIDs etc...  DJD>  We will refer to DrDeveshwar for her Rheum complaints; DrYates/ Alvester Morin have signed off; may need pain clinic....  Other problems as noted.Marland KitchenMarland Kitchen

## 2011-03-13 NOTE — Patient Instructions (Signed)
Today we updated your med list in EPIC>   Continue your current meds the same for now...  Call for any questions...  Let's plan a follow up visit in 3 months w/ blood work at that time...    Call sooner if needed for problems.Marland KitchenMarland Kitchen

## 2011-03-16 ENCOUNTER — Encounter: Payer: Self-pay | Admitting: Pulmonary Disease

## 2011-04-03 ENCOUNTER — Other Ambulatory Visit: Payer: Self-pay | Admitting: Pulmonary Disease

## 2011-05-30 ENCOUNTER — Other Ambulatory Visit: Payer: Self-pay | Admitting: Pulmonary Disease

## 2011-06-13 ENCOUNTER — Encounter: Payer: Self-pay | Admitting: Pulmonary Disease

## 2011-06-13 ENCOUNTER — Ambulatory Visit (INDEPENDENT_AMBULATORY_CARE_PROVIDER_SITE_OTHER): Payer: Medicare Other | Admitting: Pulmonary Disease

## 2011-06-13 DIAGNOSIS — M1990 Unspecified osteoarthritis, unspecified site: Secondary | ICD-10-CM

## 2011-06-13 DIAGNOSIS — D126 Benign neoplasm of colon, unspecified: Secondary | ICD-10-CM

## 2011-06-13 DIAGNOSIS — I359 Nonrheumatic aortic valve disorder, unspecified: Secondary | ICD-10-CM

## 2011-06-13 DIAGNOSIS — I872 Venous insufficiency (chronic) (peripheral): Secondary | ICD-10-CM

## 2011-06-13 DIAGNOSIS — E7800 Pure hypercholesterolemia, unspecified: Secondary | ICD-10-CM

## 2011-06-13 DIAGNOSIS — F411 Generalized anxiety disorder: Secondary | ICD-10-CM

## 2011-06-13 DIAGNOSIS — E039 Hypothyroidism, unspecified: Secondary | ICD-10-CM

## 2011-06-13 DIAGNOSIS — R609 Edema, unspecified: Secondary | ICD-10-CM

## 2011-06-13 DIAGNOSIS — N259 Disorder resulting from impaired renal tubular function, unspecified: Secondary | ICD-10-CM

## 2011-06-13 DIAGNOSIS — K5730 Diverticulosis of large intestine without perforation or abscess without bleeding: Secondary | ICD-10-CM

## 2011-06-13 DIAGNOSIS — Z23 Encounter for immunization: Secondary | ICD-10-CM

## 2011-06-13 DIAGNOSIS — K219 Gastro-esophageal reflux disease without esophagitis: Secondary | ICD-10-CM

## 2011-06-13 DIAGNOSIS — I10 Essential (primary) hypertension: Secondary | ICD-10-CM

## 2011-06-13 MED ORDER — HYDROCODONE-ACETAMINOPHEN 5-500 MG PO TABS: ORAL_TABLET | ORAL | Status: DC

## 2011-06-13 MED ORDER — ALPRAZOLAM 0.25 MG PO TABS: ORAL_TABLET | ORAL | Status: DC

## 2011-06-13 NOTE — Patient Instructions (Signed)
Today we updated your med list in EPIC...    We decided to decrease the LASIX fluid pill to 1-2 tabs each am (take at least one daily, and you may increase to 2tabs for fluid retention & swelling...  Call for any questions...  Let's plan a follow up visit in 4 months w/ FASTING blood work at that time.Marland KitchenMarland Kitchen

## 2011-06-13 NOTE — Progress Notes (Signed)
Subjective:    Patient ID: [name], female    DOB: 1915-07-22, 77 y.o.   MRN: [id number]  HPI 77 y/o WF here for a follow up visit... she has mult med problems including:  Hx asthmatic bronchitis;  HBP;  Ven Insuffic & edema;  Hyperchol;  Hypothyroid;  GERD/ Divertics/ Colon polyps;  Renal insuffic;  DJD;  Vit D defic;  Anxiety...   ~  October 15, 2010:  she went to ER 1/17 w/ epig pain x several hrs- resolved spont & no recurrence... they did CTAbd (bilat renal cysts & bilat adnexal lesions- left is larger than prev> prev eval/ followed by DrMcPhail); and labs w/ BUN=56, Creat=2.67... given Flagyl & told to f/u here> we will decr her diuretic Rx to Demadex 20mg /d (stop Aldactone) & reinforce no salt, elevation, support hose to rx edema...  ~  December 19, 2010:  2 month ROV & add-on for incr swelling in the legs right>left;  Known venous insuffic & supposedly on low sodium diet, elevation, support hose, & Lasix  20mg - 2am & 1pm currently;  She had neg venous dopplers w/o DVT etc;  f/u labs today w/ BUN=31, Creat=1.4 (improved) & we decided to try incr to 40mg Bid & refer to [facility] for their opinion;  We will check 2DEchocardiogram as well ==> modLVH, normLVF w/ EF=60-65% & norm wall motion, Gr 1 DD, abn AoV w/ ?Lambl's excrescence ==> sent to Cards for review.    AB>  On Mucinex 1-2 Bid w/ fluids;  Exacerbations treated w/ antibiotics, prednisone, tussionex when needed;  CXR today showed NAD (no edema), prob granuloma, incr markings, & ?LLL nodular opacity will be followed up...    HBP>  Controlled on Diltiazem, Lasix, & BP= 132/66;  Denies CP, palpit, dizzy, ch in SOB;  +edema r>L as noted above...    Chol>  On Zocor 20mg /d from [employer];  He has followed her FLPs but is retiring & she has requested that we follow up her labs in the future...    Hypothyroid>  On Synthroid 125mg - 1/2 tab daily;  Labs today showed TSH= 2.81, stable, continue same...    Renal insuffic>  Renal func improved w/  stopping demadex/ aldactone (changed to Lasix now at 60mg /d w/ BUN 31, Creat 1.4);  We decided to incr to 40mg  Bid due to her edema & we will watch the renal function (note: BNP= 71)...  ~  Jan 30, 2011:  6wk ROV & she was seen by Nunzio Cory for Cards 4/12> she did some blood work w/ Creat=1.8, Low titer ANA, Sed=53, & Event Recorder pending ("there was nothing life threatening, nothing to worry about" she says)...  She also saw DrEarly 4/12 for [facility]> VenDuplex confirmed reflux in deep sys on right & ablation of right GSV wouldn't help- she reports that there is nothing that they can do & he rec continued Lasix, elevation, no salt, compression hose, & f/u prn...  She complains of legs itching & we discussed a topical hydrocort cream + Atarax as needed...   Her CC today is pain in hips & legs making it hard for her to rest at night> she was prev eval by DrYates- he sent her for shots by DrNewton (no benefit she says) so he wanted her to return to DrYates but he's said there is nothing he can do> they did not offer review by Rheumatologist or Pain clinic assessment (we will proceed w/ consult from DrDeveshwar)...  ~  March 13, 2011:  6wk ROV &  she is feeling some better, swelling has diminished w/ Lasix 80mg /d & dermatitis resolved w/ HC cream;  wt is down 10# to 149#;  We reviewed current med Rx & decided to continue same & f/u in several monthe w/ f/u labs...    She also saw DrDeveshwar last week> note pending, pt says they XRayed her hand & did blood work, called for records from [facility], and has f/u 04/03/11...  ~  June 13, 2011:  47mo ROV & she has lost another 9# down to 140# & edema resolved; still on Lasix 80mg  Qam & she saw DrDeveshwar 05/29/11 w/ labs showing BUN=33, Creat=1.47; we decided to decr the Lasix to 40mg /d & may incr to 80mg  Prn...    AB> stable, no recent exac, no regular meds required & she uses the Mucinex as needed...    HBP> controlled on Diltiazem180 & Furosemide 40mg -2Qam w/ BP=148/64  today & she denies CP, palpit, dizzy, SOB, or edema at present...    VI, Edema> she knows to elim sodium, elevate, wear support hose; she has been seen by [facility], DrEarly & there is nothing they can do; edema decr w/ Lasix 80mg /d...    CHOL> prev followed by DrGegick; on Simva20 Qhs + low fat diet; we discussed FASTING blood work on ret...    Hypothy> stable on Synthroid - 1/2 tab daily; labs 3/12 showed TSH= 2.81     Renal Insuffic> Creat had increased to 1.8 on the Lasix 80mg ; repeat labs 05/29/11 by DrDeveshwar showed BUN=33, Creat=1.47, assoc w/ resolved edema therefore decr Lasix to 40mg - 1to2 Qam...    DJD> followed by Rober Minion on Vicodin, Allopurinol100 (Uric=5.9), & Colcrys0.6mg  one daily...          Problem List:  GLAUCOMA (ICD-365.9) - hx mult eye problems w/ prev corneal transplant & glaucoma laser eye surg... on eye drops per [facility]hthalmology at [facility]...  ASTHMATIC BRONCHITIS, ACUTE (ICD-466.0) - Hx AB requiring Rx w/ Depo/ Pred/ Mucinex/ Tussionex... ~  CXR 4/11 showed vague nodular densities on left... ~  8/11:  she went to an [facility] w/ cough, sputum, & dx w/ pneumonia rx w/ ZPak... ~  CXR 3/12 showed borderline cardiomeg, nodular opac left base, NAD...  HYPERTENSION (ICD-401.9) - controlled on ASA 81mg /d,  CARTIAXT 180mg /d, LASIX 40mg - 2Qam... ~  7/10:  labs showed BUN=54, Creat=2.0 & rec to decr diuretics in half= one Demedex & 1/2 Aldactone. ~  11/10:  labs showed BUN=38, Creat=1.8 & rec to keep same meds. ~  8/11:  labs showed BUN=42, Creat=2.1, K=4.3 ~  1/12:  labs in ER showed BUN=56, Creat=2.67, therefore diuretics decr to Demadex20/d only. ~  5/12:  f/u labs in office showed BUN=45, Creat=1.8, on Lasix 80mg  Qam for her edema... ~  9/12:  Labs by [facility] 9/12 showed BUN=33, Creat=1.47, edema reso;ved therefore decr Lasix to 40mg /d.  PALPITATIONS, HX OF (ICD-V12.50)  VENOUS INSUFFICIENCY (ICD-459.81) - swelling controlled w/ diuretics, low sodium diet,  elevation, support hose, etc... ~  She saw DrEarly [facility]- there was nothing he could do...  HYPERCHOLESTEROLEMIA (ICD-272.0) - prev Rx'd by DrGegick... she tells me he switched her to ZOCOR 20mg /d from the prev Crestor5 & Lescol that she has been on for years... she was intol to Lipitor due to muscle aching... she says she has to battle her insurance company for meds that DrG wants. ~  she brought labs from DrG 5/10 (off med due to $$$) w/ TChol 285, TG 204, HDL 69, LDL 195... "he was  mad at me" ~  FLP 5/11 from [facility] showed TChol 202, TG 60, HDL 112, LDL 99  HYPOTHYROIDISM (ICD-244.9) - prev followed by DrGegick on SYNTHROID - 1/2 daily... ~  pt brought labs from [name] 5/10 w/ TSH= 2.90, FreeT4= 1.09 ~  labs 8/11 showed TSH= 1.87 ~  Labs 3/12 showed TSH= 2.81  GERD (ICD-530.81) - uses PRILOSEC 20mg /d... last EGD was 6/06 by DrPatterson showing 3cmHH, reflux...   DIVERTICULOSIS OF COLON (ICD-562.10) & COLONIC POLYPS (ICD-211.3) - last colonoscopy 6/06 was WNL- no abnormality seen...  RENAL INSUFFICIENCY (ICD-588.9) - tough balance betw renal perfusion & diuresis for edema... ~  labs 10/08 showed BUN= 27, Creat= 1.5 ~  labs 8/09 showed BUN= 44, Creat= 1.9 ~  labs 2/10 showed BUN 46, Creat= 1.7 ~  labs 7/10 showed BUN= 54, Creat= 2.0 ~  labs 11/10 showed BUN= 38, Creat= 1.8 ~  labs 8/11 showed BUN= 42, Creat= 2.1 ~  labs in ER 1/12 showed BUN=56, Creat=2.67, rec decr diuretics to Demadex20mg /d only. ~  Labs 3/12 showed improved renal function w/ BUN=31, Creat=1.4... ~  Labs 5/12 on Lasix80 showed BUN=45, Creat=1.8 ~  Labs 9/12 by DrDeveshwar showed BUN=33, Creat=1.47  DEGENERATIVE JOINT DISEASE (ICD-715.90) - this is her chief complaint- s/p right TKR in 1999,  left TKR 3/10... on MOBIC 7.5mg  Prn & VICODIN Prn as well... followed by DrYates; and had right second toe amp by Volney Presser 2010 for hammertoe + spurs... also had epidural steroid shot from [facility] for LBP (without benefit she  says)...  VITAMIN D DEFICIENCY (ICD-268.9) ~  labs 8/09 showed Vit D level = 12... rec- start Vit D 50,000 u weekly... ~  labs 2/10 showed Vit D level = 30... rec- continue Vit D 50K weekly... ~  labs 7/10 showed Vit D level = 39... rec- change to 1000 u OTC daily. ~  labs 8/11 showed Vit D level = 38... continue same.  ANXIETY (ICD-300.00) - on ALPRAZOLAM 0.25mg Tid Prn... several deaths in the family... daughter who lives w/ her recently ill...  SHINGLES - she had right T9-10 shingles in 2011 & resolved w/ Valtrex, Pred, etc...   Past Surgical History  Procedure Date  . Cataract extraction   . Abdominal hysterectomy   . Breast biopsy     Benign  . Total knee arthroplasty 1999    right  . Total knee arthroplasty 11/2008    left - Dr Ophelia Charter  . Toe amputation 08/2009    Right second toe - Dr Lestine Box    Outpatient Encounter Prescriptions as of 06/13/2011  Medication Sig Dispense Refill  . ALPRAZolam (XANAX) 0.25 MG tablet Take 1/2 to 1 tablet By mouth three times a day as needed for nerves  90 tablet  5  . Ascorbic Acid (VITAMIN C) 500 MG tablet Take 500 mg by mouth daily.        [name] aspirin 81 MG tablet Take 81 mg by mouth daily.        . bacitracin-neomycin-polymyxin b-hydrocortisone (CORTISPORIN) 1 % ointment Apply to legs two times daily as needed  15 g  5  . Cholecalciferol (VITAMIN D) 1000 UNITS capsule Take 1,000 Units by mouth daily.        . diclofenac (VOLTAREN) 75 MG EC tablet Take 1 tablet (75 mg total) by mouth 2 (two) times daily.  60 tablet  2  . diltiazem (CARDIZEM CD) 180 MG 24 hr capsule TAKE 1 CAPSULE DAILY  30 capsule  5  . fluorometholone (FML)  0.1 % ophthalmic suspension One drop in right eye at bedtime       . furosemide (LASIX) 40 MG tablet Take 2 tablets by mouth every morning  60 tablet  5  . guaiFENesin (MUCINEX) 600 MG 12 hr tablet Take 1,200 mg by mouth 2 (two) times daily.        [name] HYDROcodone-acetaminophen (VICODIN) 5-500 MG per tablet Take 1/2 to 1 By  mouth three times a day as needed for pain  90 tablet  5  . levothyroxine (SYNTHROID, LEVOTHROID) 125 MCG tablet Take 1/2 tablet By mouth once daily  90 tablet  3  . omeprazole (PRILOSEC) 20 MG capsule Take 20 mg by mouth daily.        Bertram Gala Glycol-Propyl Glycol (SYSTANE) 0.4-0.3 % SOLN One drop in each eye two times a day       . simvastatin (ZOCOR) 20 MG tablet TAKE 1 TAB BY MOUTH AT BEDTIME  90 tablet  3  . timolol (TIMOPTIC) 0.5 % ophthalmic solution Place 1 drop into both eyes 2 (two) times daily.          Allergies  Allergen Reactions  . Clarithromycin     REACTION: causes her mouth to swell  . Codeine     Makes pt nervous  . Morphine     REACTION: n/v  . Sulfonamide Derivatives     REACTION: unsure of reaction    Review of Systems        See HPI The patient complains of dyspnea on exertion, peripheral edema, muscle weakness, and difficulty walking.  The patient denies anorexia, fever, weight loss, weight gain, vision loss, decreased hearing, hoarseness, chest pain, syncope, prolonged cough, headaches, hemoptysis, abdominal pain, melena, hematochezia, severe indigestion/heartburn, hematuria, incontinence, genital sores, suspicious skin lesions, transient blindness, depression, unusual weight change, abnormal bleeding, enlarged lymph nodes, and angioedema.     Objective:   Physical Exam      WD, WN, 77 y/o WF in NAD... GENERAL:  Alert & oriented; pleasant & cooperative... HEENT:  [place]/AT, EOM- full, EACs-clear, TMs-wnl, NOSE-clear, THROAT-clear & wnl. NECK:  Supple w/ fairROM; no JVD; normal carotid impulses w/o bruits; no thyromegaly or nodules palpated; no lymphadenopathy. CHEST:  Clear, decr BS bilat w/o wheezing, rales, or rhonchi heard... HEART:  Regular Rhythm;  gr 1/6 SEM without rubs or gallops detected... ABDOMEN:  Soft & nontender; normal bowel sounds; no organomegaly or masses detected. EXT:  moderate arthritic changes; s/p bilat TKRs; mild varicose veins/  +venous insuffic/ 2+edema R>L Right leg sl red/ inflammed, s/p right second toe distal amputation... NEURO:  CN's intact;  no focal neuro deficits... DERM:  No lesions noted; no rash etc...   Assessment & Plan:   HBP>  Controlled on meds, continue same except we decreased the Lasix to 40mg  Qam...  Cardiac>  DrRoss' note reviewed; see 2DEcho, Event Monitor reports no dangerous arrhythmias, continue meds...  Ven Insuffic/ EDEMA>  evals by Cards & VVS> "there is nothing they can do"; continue elevation, no salt, Lasix, compression...  CHOL>  On Simva20 per DrGegick> needs fasting labs on ret...  HYPOTHYROID>  Continue Synthroid 123mcg/d taking 1/2 tab per DrGegick...  GI>  Stable & followed by DrPatterson...  Renal Insuffic>  Careful w/ diuresis, NSAIDs etc...  DJD>  We will refer to DrDeveshwar for her Rheum complaints; DrYates/ Alvester Morin have signed off; may need pain clinic....  Other problems as noted.Marland KitchenMarland Kitchen

## 2011-06-25 ENCOUNTER — Other Ambulatory Visit: Payer: Self-pay | Admitting: Pulmonary Disease

## 2011-08-12 ENCOUNTER — Telehealth: Payer: Self-pay | Admitting: Pulmonary Disease

## 2011-08-12 NOTE — Telephone Encounter (Signed)
I spoke with pt and she states she needed a refill on her synthroid medication. I advised her we sent a rx 03/13/11 #90 x 3 refills. She stated [facility] advised [facility] she did not have an refills elft. I called [facility] and they advised me they do have rx on file from June and will refill medication for pt. Pt is aware of this and needed nothing further

## 2011-09-30 ENCOUNTER — Other Ambulatory Visit: Payer: Self-pay | Admitting: Pulmonary Disease

## 2011-10-15 ENCOUNTER — Encounter: Payer: Self-pay | Admitting: Pulmonary Disease

## 2011-10-15 ENCOUNTER — Ambulatory Visit (INDEPENDENT_AMBULATORY_CARE_PROVIDER_SITE_OTHER): Payer: Medicare Other | Admitting: Pulmonary Disease

## 2011-10-15 ENCOUNTER — Other Ambulatory Visit (INDEPENDENT_AMBULATORY_CARE_PROVIDER_SITE_OTHER): Payer: Medicare Other

## 2011-10-15 DIAGNOSIS — E039 Hypothyroidism, unspecified: Secondary | ICD-10-CM

## 2011-10-15 DIAGNOSIS — R0989 Other specified symptoms and signs involving the circulatory and respiratory systems: Secondary | ICD-10-CM | POA: Diagnosis not present

## 2011-10-15 DIAGNOSIS — R0600 Dyspnea, unspecified: Secondary | ICD-10-CM

## 2011-10-15 DIAGNOSIS — I10 Essential (primary) hypertension: Secondary | ICD-10-CM | POA: Diagnosis not present

## 2011-10-15 DIAGNOSIS — I359 Nonrheumatic aortic valve disorder, unspecified: Secondary | ICD-10-CM

## 2011-10-15 DIAGNOSIS — K219 Gastro-esophageal reflux disease without esophagitis: Secondary | ICD-10-CM

## 2011-10-15 DIAGNOSIS — J209 Acute bronchitis, unspecified: Secondary | ICD-10-CM | POA: Diagnosis not present

## 2011-10-15 DIAGNOSIS — F411 Generalized anxiety disorder: Secondary | ICD-10-CM

## 2011-10-15 DIAGNOSIS — E7800 Pure hypercholesterolemia, unspecified: Secondary | ICD-10-CM

## 2011-10-15 DIAGNOSIS — K5730 Diverticulosis of large intestine without perforation or abscess without bleeding: Secondary | ICD-10-CM

## 2011-10-15 DIAGNOSIS — I872 Venous insufficiency (chronic) (peripheral): Secondary | ICD-10-CM

## 2011-10-15 DIAGNOSIS — N259 Disorder resulting from impaired renal tubular function, unspecified: Secondary | ICD-10-CM

## 2011-10-15 DIAGNOSIS — R0609 Other forms of dyspnea: Secondary | ICD-10-CM

## 2011-10-15 DIAGNOSIS — M1990 Unspecified osteoarthritis, unspecified site: Secondary | ICD-10-CM

## 2011-10-15 LAB — CBC WITH DIFFERENTIAL/PLATELET
Basophils Relative: 0.3 % (ref 0.0–3.0)
Eosinophils Relative: 0.3 % (ref 0.0–5.0)
HCT: 41.1 % (ref 36.0–46.0)
Hemoglobin: 13.7 g/dL (ref 12.0–15.0)
Lymphs Abs: 2.4 10*3/uL (ref 0.7–4.0)
MCV: 90.2 fl (ref 78.0–100.0)
Monocytes Absolute: 0.4 10*3/uL (ref 0.1–1.0)
Neutro Abs: 3.1 10*3/uL (ref 1.4–7.7)
RBC: 4.56 Mil/uL (ref 3.87–5.11)
WBC: 6 10*3/uL (ref 4.5–10.5)

## 2011-10-15 LAB — BASIC METABOLIC PANEL
BUN: 26 mg/dL — ABNORMAL HIGH (ref 6–23)
Creatinine, Ser: 1.3 mg/dL — ABNORMAL HIGH (ref 0.4–1.2)
GFR: 40.12 mL/min — ABNORMAL LOW (ref >60.00)

## 2011-10-15 LAB — HEPATIC FUNCTION PANEL
Bilirubin, Direct: 0 mg/dL (ref 0.0–0.3)
Total Bilirubin: 0.7 mg/dL (ref 0.3–1.2)

## 2011-10-15 LAB — BRAIN NATRIURETIC PEPTIDE: Pro B Natriuretic peptide (BNP): 50 pg/mL (ref 0.0–100.0)

## 2011-10-15 NOTE — Patient Instructions (Signed)
Today we updated your med list in our EPIC system...    Continue your current medications the same...  We decided to try ENABLEX 7,5mg  one tab daily to see if it helps your voiding...    We gave you a month of samples- call us to let us known how it worked for you...  Today we did your follow up blood work...    Please call the PHONE TREE in a few days for your results...    Dial [id number] & when prompted enter your patient number followed by the # symbol...    Your patient number is:  [phone]  Call for any questions...  Let's plan a follow up visit in 4 months.Marland KitchenMarland Kitchen

## 2011-10-22 ENCOUNTER — Encounter: Payer: Self-pay | Admitting: Pulmonary Disease

## 2011-10-22 NOTE — Progress Notes (Signed)
Subjective:    Patient ID: [name], female    DOB: 1915-07-22, 78 y.o.   MRN: [id number]  HPI 78 y/o WF here for a follow up visit... she has mult med problems including:  Hx asthmatic bronchitis;  HBP;  Ven Insuffic & edema;  Hyperchol;  Hypothyroid;  GERD/ Divertics/ Colon polyps;  Renal insuffic;  DJD;  Vit D defic;  Anxiety...   ~  October 15, 2010:  she went to ER 1/17 w/ epig pain x several hrs- resolved spont & no recurrence... they did CTAbd (bilat renal cysts & bilat adnexal lesions- left is larger than prev> prev eval/ followed by DrMcPhail); and labs w/ BUN=56, Creat=2.67... given Flagyl & told to f/u here> we will decr her diuretic Rx to Demadex 20mg /d (stop Aldactone) & reinforce no salt, elevation, support hose to rx edema...  ~  December 19, 2010:  2 month ROV & add-on for incr swelling in the legs right>left;  Known venous insuffic & supposedly on low sodium diet, elevation, support hose, & Lasix  20mg - 2am & 1pm currently;  She had neg venous dopplers w/o DVT etc;  f/u labs today w/ BUN=31, Creat=1.4 (improved) & we decided to try incr to 40mg Bid & refer to [facility]VS for their opinion;  We will check 2DEchocardiogram as well ==> modLVH, normLVF w/ EF=60-65% & norm wall motion, Gr 1 DD, abn AoV w/ ?Lambl's excrescence ==> sent to Cards for review.    AB>  On Mucinex 1-2 Bid w/ fluids;  Exacerbations treated w/ antibiotics, prednisone, tussionex when needed;  CXR today showed NAD (no edema), prob granuloma, incr markings, & ?LLL nodular opacity will be followed up...    HBP>  Controlled on Diltiazem, Lasix, & BP= 132/66;  Denies CP, palpit, dizzy, ch in SOB;  +edema r>L as noted above...    Chol>  On Zocor 20mg /d from [employer];  He has followed her FLPs but is retiring & she has requested that we follow up her labs in the future...    Hypothyroid>  On Synthroid 125mg - 1/2 tab daily;  Labs today showed TSH= 2.81, stable, continue same...    Renal insuffic>  Renal func improved w/  stopping demadex/ aldactone (changed to Lasix now at 60mg /d w/ BUN 31, Creat 1.4);  We decided to incr to 40mg  Bid due to her edema & we will watch the renal function (note: BNP= 71)...  ~  Jan 30, 2011:  6wk ROV & she was seen by Nunzio Cory for Cards 4/12> she did some blood work w/ Creat=1.8, Low titer ANA, Sed=53, & Event Recorder pending ("there was nothing life threatening, nothing to worry about" she says)...  She also saw DrEarly 4/12 for VVS> VenDuplex confirmed reflux in deep sys on right & ablation of right GSV wouldn't help- she reports that there is nothing that they can do & he rec continued Lasix, elevation, no salt, compression hose, & f/u prn...  She complains of legs itching & we discussed a topical hydrocort cream + Atarax as needed...   Her CC today is pain in hips & legs making it hard for her to rest at night> she was prev eval by DrYates- he sent her for shots by DrNewton (no benefit she says) so he wanted her to return to DrYates but he's said there is nothing he can do> they did not offer review by Rheumatologist or Pain clinic assessment (we will proceed w/ consult from DrDeveshwar)...  ~  March 13, 2011:  6wk ROV &  she is feeling some better, swelling has diminished w/ Lasix 80mg /d & dermatitis resolved w/ HC cream;  wt is down 10# to 149#;  We reviewed current med Rx & decided to continue same & f/u in several monthe w/ f/u labs...    She also saw DrDeveshwar last week> note pending, pt says they XRayed her hand & did blood work, called for records from [place], and has f/u 04/03/11...  ~  June 13, 2011:  42mo ROV & she has lost another 9# down to 140# & edema resolved; still on Lasix 80mg  Qam & she saw DrDeveshwar 05/29/11 w/ labs showing BUN=33, Creat=1.47; we decided to decr the Lasix to 40mg /d & may incr to 80mg  Prn...    AB> stable, no recent exac, no regular meds required & she uses the Mucinex as needed...    HBP> controlled on Diltiazem180 & Furosemide 40mg -2Qam w/ BP=148/64  today & she denies CP, palpit, dizzy, SOB, or edema at present...    VI, Edema> she knows to elim sodium, elevate, wear support hose; she has been seen by VVS, DrEarly & there is nothing they can do; edema decr w/ Lasix 80mg /d...    CHOL> prev followed by DrGegick; on Simva20 Qhs + low fat diet; we discussed FASTING blood work on ret...    Hypothy> stable on Synthroid - 1/2 tab daily; labs 3/12 showed TSH= 2.81     Renal Insuffic> Creat had increased to 1.8 on the Lasix 80mg ; repeat labs 05/29/11 by DrDeveshwar showed BUN=33, Creat=1.47, assoc w/ resolved edema therefore decr Lasix to 40mg - 1to2 Qam...    DJD> followed by Rober Minion on Vicodin, Allopurinol100 (Uric=5.9), & Colcrys0.6mg  one daily...  ~  October 15, 2011:  50mo ROV & [name] continues to lose some wt- down 7# further to 133# today, appetite is ok, eating fair & encouraged to add supplements like Ensure vs Boost etc at least Bid;  Meds reviewed & unchanged;  She had f/u DrDeveshwar 12/12 for Rheum- OA, Gout, bursitis, renal insuffic makes avoiding NSAIDs imperitive & rec to try Aspercreme...    Her breathing is stable w/o interval resp exac...    BP is stable on Diltiazem & Lasix, but reads 160/76 today (hasn't taken meds yet); we reviewed diet, no salt, etc...    FLP controlled on Simva20 but she hasn't remembered to come fasting for recheck FLP; continue diet, exercise & the same med for now...    She is c/o urinary symptoms and we discussed trial of ENABLEX 7.5mg  to see if this helps; mild renal insuffic is improved on ntodays labs w/ Creat=1.3          Problem List:  GLAUCOMA (ICD-365.9) - hx mult eye problems w/ prev corneal transplant & glaucoma laser eye surg... on eye drops per Ophthalmology at [facility]...  ASTHMATIC BRONCHITIS, ACUTE (ICD-466.0) - Hx AB requiring Rx w/ Depo/ Pred/ Mucinex/ Tussionex... ~  CXR 4/11 showed vague nodular densities on left... ~  8/11:  she went to an [facility] w/ cough, sputum, & dx w/  pneumonia rx w/ ZPak... ~  CXR 3/12 showed borderline cardiomeg, nodular opac left base, NAD...  HYPERTENSION (ICD-401.9) - controlled on ASA 81mg /d,  CARTIAXT 180mg /d, LASIX 40mg - 2Qam... ~  7/10:  labs showed BUN=54, Creat=2.0 & rec to decr diuretics in half= one Demedex & 1/2 Aldactone. ~  11/10:  labs showed BUN=38, Creat=1.8 & rec to keep same meds. ~  8/11:  labs showed BUN=42, Creat=2.1, K=4.3 ~  1/12:  labs in ER showed BUN=56, Creat=2.67, therefore diuretics decr to Demadex20/d only. ~  5/12:  f/u labs in office showed BUN=45, Creat=1.8, on Lasix 80mg  Qam for her edema... ~  9/12:  Labs by DrDeveshwar 9/12 showed BUN=33, Creat=1.47, edema resolved therefore decr Lasix to 40mg /d. ~  1/13:  BP= 160/76 but hasn't taken meds today; labs improved w/ BUN=26, Creat=1.3, BNP=50  PALPITATIONS, HX OF (ICD-V12.50)  VENOUS INSUFFICIENCY (ICD-459.81) - swelling controlled w/ diuretics, low sodium diet, elevation, support hose, etc... ~  She saw DrEarly [facility]- there was nothing he could do...  HYPERCHOLESTEROLEMIA (ICD-272.0) - prev Rx'd by DrGegick... Now on ZOCOR 20mg /d (prev Crestor5 & Lescol from [facility]) ~  she brought labs from [facility] 5/10 (off med due to $$$) w/ TChol 285, TG 204, HDL 69, LDL 195... "he was mad at me" ~  FLP 5/11 from [facility] showed TChol 202, TG 60, HDL 112, LDL 99 ~  She is reminded (again) to come FASTING for f/u FLP...  HYPOTHYROIDISM (ICD-244.9) - prev followed by DrGegick on SYNTHROID - 1/2 daily... ~  pt brought labs from [facility] 5/10 w/ TSH= 2.90, FreeT4= 1.09 ~  labs 8/11 showed TSH= 1.87 ~  Labs 3/12 showed TSH= 2.81 ~  Labs 1/13 showed TSH= 1.65  GERD (ICD-530.81) - uses PRILOSEC 20mg /d... last EGD was 6/06 by DrPatterson showing 3cmHH, reflux...   DIVERTICULOSIS OF COLON (ICD-562.10) & COLONIC POLYPS (ICD-211.3) - last colonoscopy 6/06 was WNL- no abnormality seen...  RENAL INSUFFICIENCY (ICD-588.9) - tough balance betw renal perfusion & diuresis for  edema... ~  labs 10/08 showed BUN= 27, Creat= 1.5 ~  labs 8/09 showed BUN= 44, Creat= 1.9 ~  labs 2/10 showed BUN 46, Creat= 1.7 ~  labs 7/10 showed BUN= 54, Creat= 2.0 ~  labs 11/10 showed BUN= 38, Creat= 1.8 ~  labs 8/11 showed BUN= 42, Creat= 2.1 ~  labs in ER 1/12 showed BUN=56, Creat=2.67, rec decr diuretics to Demadex20mg /d only. ~  Labs 3/12 showed improved renal function w/ BUN=31, Creat=1.4... ~  Labs 5/12 on Lasix80 showed BUN=45, Creat=1.8 ~  Labs 9/12 by DrDeveshwar showed BUN=33, Creat=1.47 ~  Labs 1/13 showed BUN=26, Creat=1.3, BNP=50... On Lasix 40mg  Qam.  DEGENERATIVE JOINT DISEASE (ICD-715.90) - this is her chief complaint- s/p right TKR in 1999,  left TKR 3/10... on MOBIC 7.5mg  Prn & VICODIN Prn as well... followed by DrYates; and had right second toe amp by Volney Presser 2010 for hammertoe + spurs... also had epidural steroid shot from [facility] for LBP (without benefit she says)...  VITAMIN D DEFICIENCY (ICD-268.9) ~  labs 8/09 showed Vit D level = 12... rec- start Vit D 50,000 u weekly... ~  labs 2/10 showed Vit D level = 30... rec- continue Vit D 50K weekly... ~  labs 7/10 showed Vit D level = 39... rec- change to 1000 u OTC daily. ~  labs 8/11 showed Vit D level = 38... continue same.  ANXIETY (ICD-300.00) - on ALPRAZOLAM 0.25mg Tid Prn... several deaths in the family... daughter who lives w/ her recently ill...  SHINGLES - she had right T9-10 shingles in 2011 & resolved w/ Valtrex, Pred, etc...   Past Surgical History  Procedure Date  . Cataract extraction   . Abdominal hysterectomy   . Breast biopsy     Benign  . Total knee arthroplasty 1999    right  . Total knee arthroplasty 11/2008    left - Dr Ophelia Charter  . Toe amputation 08/2009    Right second toe -  Dr Lestine Box    Outpatient Encounter Prescriptions as of 10/15/2011  Medication Sig Dispense Refill  . allopurinol (ZYLOPRIM) 100 MG tablet Take 100 mg by mouth daily.        [name] ALPRAZolam (XANAX) 0.25 MG tablet  Take 1/2 to 1 tablet By mouth three times a day as needed for nerves  90 tablet  5  . Ascorbic Acid (VITAMIN C) 500 MG tablet Take 500 mg by mouth daily.        [name] aspirin 81 MG tablet Take 81 mg by mouth daily.        . Cholecalciferol (VITAMIN D) 1000 UNITS capsule Take 1,000 Units by mouth daily.        . colchicine 0.6 MG tablet Take 0.6 mg by mouth daily.        [name] diltiazem (CARDIZEM CD) 180 MG 24 hr capsule TAKE 1 CAPSULE DAILY  30 capsule  5  . ferrous sulfate 325 (65 FE) MG tablet Take 325 mg by mouth daily with breakfast.      . fluorometholone (FML) 0.1 % ophthalmic suspension One drop in right eye at bedtime       . furosemide (LASIX) 40 MG tablet       . guaiFENesin (MUCINEX) 600 MG 12 hr tablet Take 1,200 mg by mouth 2 (two) times daily.        [name] HYDROcodone-acetaminophen (VICODIN) 5-500 MG per tablet Take 1/2 to 1 By mouth three times a day as needed for pain  90 tablet  5  . levothyroxine (SYNTHROID, LEVOTHROID) 125 MCG tablet Take 1/2 tablet By mouth once daily  90 tablet  3  . omeprazole (PRILOSEC) 20 MG capsule Take 20 mg by mouth daily.        Bertram Gala Glycol-Propyl Glycol (SYSTANE) 0.4-0.3 % SOLN One drop in each eye two times a day       . simvastatin (ZOCOR) 20 MG tablet TAKE 1 TAB BY MOUTH AT BEDTIME  90 tablet  3  . timolol (TIMOPTIC) 0.5 % ophthalmic solution Place 1 drop into both eyes 2 (two) times daily.          Allergies  Allergen Reactions  . Clarithromycin     REACTION: causes her mouth to swell  . Codeine     Makes pt nervous  . Morphine     REACTION: n/v  . Sulfonamide Derivatives     REACTION: unsure of reaction    Current Medications, Allergies, Past Medical History, Past Surgical History, Family History, and Social History were reviewed in [employer] record.    Review of Systems        See HPI - all other systems neg except as noted...  The patient complains of dyspnea on exertion, peripheral edema, muscle weakness,  and difficulty walking.  The patient denies anorexia, fever, weight loss, weight gain, vision loss, decreased hearing, hoarseness, chest pain, syncope, prolonged cough, headaches, hemoptysis, abdominal pain, melena, hematochezia, severe indigestion/heartburn, hematuria, incontinence, genital sores, suspicious skin lesions, transient blindness, depression, unusual weight change, abnormal bleeding, enlarged lymph nodes, and angioedema.     Objective:   Physical Exam      WD, WN, 78 y/o WF in NAD... GENERAL:  Alert & oriented; pleasant & cooperative... HEENT:  [place]/AT, EOM- full, EACs-clear, TMs-wnl, NOSE-clear, THROAT-clear & wnl. NECK:  Supple w/ fairROM; no JVD; normal carotid impulses w/o bruits; no thyromegaly or nodules palpated; no lymphadenopathy. CHEST:  Clear, decr BS bilat w/o wheezing, rales, or  rhonchi heard... HEART:  Regular Rhythm;  gr 1/6 SEM without rubs or gallops detected... ABDOMEN:  Soft & nontender; normal bowel sounds; no organomegaly or masses detected. EXT:  moderate arthritic changes; s/p bilat TKRs; mild varicose veins/ +venous insuffic/ 2+edema R>L Right leg sl red/ inflammed, s/p right second toe distal amputation... NEURO:  CN's intact;  no focal neuro deficits... DERM:  No lesions noted; no rash etc...  RADIOLOGY DATA:  Reviewed in the EPIC EMR & discussed w/ the patient...    >>Last CXR 3/12 showed scat granulomas, ?left base nodule, right side clear, borderline cardiomegaly...  LABORATORY DATA:  Reviewed in the EPIC EMR & discussed w/ the patient...    >>Labs 1/13 reviewed...   Assessment & Plan:   HBP>  Controlled on meds, continue same including the Lasix to 40mg  Qam...  Cardiac>  DrRoss' note reviewed; see 2DEcho, Event Monitor reports no dangerous arrhythmias, continue meds...  Ven Insuffic/ EDEMA>  evals by Cards & VVS> "there is nothing they can do"; continue elevation, no salt, Lasix, compression...  CHOL>  On Simva20 per DrGegick> needs fasting  labs on ret...  HYPOTHYROID>  Continue Synthroid 126mcg/d taking 1/2 tab per DrGegick...  GI>  Stable & followed by DrPatterson...  Renal Insuffic>  Careful w/ diuresis, NSAIDs etc; Creat improved to 1.3 on lower dose of Lasix.  DJD>  We will refer to DrDeveshwar for her Rheum complaints; DrYates/ Alvester Morin have signed off; may need pain clinic....  Other problems as noted...   Patient's Medications  New Prescriptions   No medications on file  Previous Medications   ALLOPURINOL (ZYLOPRIM) 100 MG TABLET    Take 100 mg by mouth daily.     ALPRAZOLAM (XANAX) 0.25 MG TABLET    Take 1/2 to 1 tablet By mouth three times a day as needed for nerves   ASCORBIC ACID (VITAMIN C) 500 MG TABLET    Take 500 mg by mouth daily.     ASPIRIN 81 MG TABLET    Take 81 mg by mouth daily.     CHOLECALCIFEROL (VITAMIN D) 1000 UNITS CAPSULE    Take 1,000 Units by mouth daily.     COLCHICINE 0.6 MG TABLET    Take 0.6 mg by mouth daily.     DILTIAZEM (CARDIZEM CD) 180 MG 24 HR CAPSULE    TAKE 1 CAPSULE DAILY   FERROUS SULFATE 325 (65 FE) MG TABLET    Take 325 mg by mouth daily with breakfast.   FLUOROMETHOLONE (FML) 0.1 % OPHTHALMIC SUSPENSION    One drop in right eye at bedtime    GUAIFENESIN (MUCINEX) 600 MG 12 HR TABLET    Take 1,200 mg by mouth 2 (two) times daily.     HYDROCODONE-ACETAMINOPHEN (VICODIN) 5-500 MG PER TABLET    Take 1/2 to 1 By mouth three times a day as needed for pain   LEVOTHYROXINE (SYNTHROID, LEVOTHROID) 125 MCG TABLET    Take 1/2 tablet By mouth once daily   OMEPRAZOLE (PRILOSEC) 20 MG CAPSULE    Take 20 mg by mouth daily.     POLYETHYL GLYCOL-PROPYL GLYCOL (SYSTANE) 0.4-0.3 % SOLN    One drop in each eye two times a day    SIMVASTATIN (ZOCOR) 20 MG TABLET    TAKE 1 TAB BY MOUTH AT BEDTIME   TIMOLOL (TIMOPTIC) 0.5 % OPHTHALMIC SOLUTION    Place 1 drop into both eyes 2 (two) times daily.    Modified Medications   Modified Medication Previous Medication  FUROSEMIDE (LASIX) 40 MG TABLET  furosemide (LASIX) 40 MG tablet          TAKE 2 TABLETS BY MOUTH ONCE EVERY MORNING  Discontinued Medications   DICLOFENAC (VOLTAREN) 75 MG EC TABLET    Take 1 tablet (75 mg total) by mouth 2 (two) times daily.

## 2011-11-01 ENCOUNTER — Telehealth: Payer: Self-pay | Admitting: Pulmonary Disease

## 2011-11-01 NOTE — Telephone Encounter (Signed)
Per TP: stop the enablex and discuss with SN at next ov.  Thanks.

## 2011-11-01 NOTE — Telephone Encounter (Signed)
I spoke with [name] and she states SN started her on enablex on 10/15/11 and every since she has a dry mouth, tongue is swollen, red, and has blisters on it. I advised [name] to stop the medication if she feels like this is what causing it and will forward to TP for further recs since SN is not in. Please advise tammy, thanks  Allergies  Allergen Reactions  . Clarithromycin     REACTION: causes her mouth to swell  . Codeine     Makes [name] nervous  . Morphine     REACTION: n/v  . Sulfonamide Derivatives     REACTION: unsure of reaction

## 2011-11-01 NOTE — Telephone Encounter (Signed)
I spoke with pt and is aware of TP recs. She voiced her understanding. I advised her if her swelling did not go away then to seek emergency care, She voiced her understanding

## 2011-12-12 DIAGNOSIS — L84 Corns and callosities: Secondary | ICD-10-CM | POA: Diagnosis not present

## 2011-12-18 DIAGNOSIS — Z79899 Other long term (current) drug therapy: Secondary | ICD-10-CM | POA: Diagnosis not present

## 2011-12-18 DIAGNOSIS — R5381 Other malaise: Secondary | ICD-10-CM | POA: Diagnosis not present

## 2011-12-18 DIAGNOSIS — M2550 Pain in unspecified joint: Secondary | ICD-10-CM | POA: Diagnosis not present

## 2012-01-02 DIAGNOSIS — H409 Unspecified glaucoma: Secondary | ICD-10-CM | POA: Diagnosis not present

## 2012-01-02 DIAGNOSIS — Z961 Presence of intraocular lens: Secondary | ICD-10-CM | POA: Diagnosis not present

## 2012-01-02 DIAGNOSIS — H4011X0 Primary open-angle glaucoma, stage unspecified: Secondary | ICD-10-CM | POA: Diagnosis not present

## 2012-01-02 DIAGNOSIS — H02839 Dermatochalasis of unspecified eye, unspecified eyelid: Secondary | ICD-10-CM | POA: Diagnosis not present

## 2012-01-02 DIAGNOSIS — Z947 Corneal transplant status: Secondary | ICD-10-CM | POA: Diagnosis not present

## 2012-02-01 ENCOUNTER — Telehealth: Payer: Self-pay | Admitting: Internal Medicine

## 2012-02-01 NOTE — Telephone Encounter (Signed)
Ran out of vicodin, was taking at hs to help her sleep. Has xanax not using for sleep. rec use xanax at hs and discuss refills on controlled meds with Dr Kriste Basque 5/13

## 2012-02-06 ENCOUNTER — Other Ambulatory Visit: Payer: Self-pay | Admitting: Pulmonary Disease

## 2012-02-06 ENCOUNTER — Other Ambulatory Visit: Payer: Self-pay | Admitting: *Deleted

## 2012-02-06 ENCOUNTER — Other Ambulatory Visit (INDEPENDENT_AMBULATORY_CARE_PROVIDER_SITE_OTHER): Payer: Medicare Other

## 2012-02-06 DIAGNOSIS — D126 Benign neoplasm of colon, unspecified: Secondary | ICD-10-CM

## 2012-02-06 DIAGNOSIS — I10 Essential (primary) hypertension: Secondary | ICD-10-CM

## 2012-02-06 DIAGNOSIS — E7800 Pure hypercholesterolemia, unspecified: Secondary | ICD-10-CM

## 2012-02-06 LAB — LIPID PANEL
HDL: 82.2 mg/dL (ref >39.00)
Triglycerides: 81 mg/dL (ref 0.0–149.0)

## 2012-02-06 LAB — BASIC METABOLIC PANEL
CO2: 28 mEq/L (ref 19–32)
Calcium: 9.1 mg/dL (ref 8.4–10.5)
Glucose, Bld: 85 mg/dL (ref 70–99)
Sodium: 136 mEq/L (ref 135–145)

## 2012-02-06 LAB — HEPATIC FUNCTION PANEL
Albumin: 3.7 g/dL (ref 3.5–5.2)
Alkaline Phosphatase: 77 U/L (ref 39–117)

## 2012-02-06 LAB — CBC WITH DIFFERENTIAL/PLATELET
Basophils Absolute: 0 10*3/uL (ref 0.0–0.1)
Eosinophils Absolute: 0 10*3/uL (ref 0.0–0.7)
Hemoglobin: 12.4 g/dL (ref 12.0–15.0)
Lymphocytes Relative: 29.6 % (ref 12.0–46.0)
Lymphs Abs: 2 10*3/uL (ref 0.7–4.0)
MCHC: 32.4 g/dL (ref 30.0–36.0)
Monocytes Absolute: 0.4 10*3/uL (ref 0.1–1.0)
Neutro Abs: 4.2 10*3/uL (ref 1.4–7.7)
RDW: 15.7 % — ABNORMAL HIGH (ref 11.5–14.6)

## 2012-02-06 MED ORDER — HYDROCODONE-ACETAMINOPHEN 5-500 MG PO TABS: ORAL_TABLET | ORAL | Status: DC

## 2012-02-10 DIAGNOSIS — M109 Gout, unspecified: Secondary | ICD-10-CM | POA: Diagnosis not present

## 2012-02-10 DIAGNOSIS — M19049 Primary osteoarthritis, unspecified hand: Secondary | ICD-10-CM | POA: Diagnosis not present

## 2012-02-10 DIAGNOSIS — M76899 Other specified enthesopathies of unspecified lower limb, excluding foot: Secondary | ICD-10-CM | POA: Diagnosis not present

## 2012-02-10 DIAGNOSIS — M810 Age-related osteoporosis without current pathological fracture: Secondary | ICD-10-CM | POA: Diagnosis not present

## 2012-02-12 ENCOUNTER — Ambulatory Visit (INDEPENDENT_AMBULATORY_CARE_PROVIDER_SITE_OTHER): Payer: Medicare Other | Admitting: Pulmonary Disease

## 2012-02-12 ENCOUNTER — Encounter: Payer: Self-pay | Admitting: Pulmonary Disease

## 2012-02-12 VITALS — BP 140/78 | HR 82 | Temp 97.5°F | Ht 63.0 in | Wt 137.2 lb

## 2012-02-12 DIAGNOSIS — E039 Hypothyroidism, unspecified: Secondary | ICD-10-CM

## 2012-02-12 DIAGNOSIS — I872 Venous insufficiency (chronic) (peripheral): Secondary | ICD-10-CM

## 2012-02-12 DIAGNOSIS — I10 Essential (primary) hypertension: Secondary | ICD-10-CM | POA: Diagnosis not present

## 2012-02-12 DIAGNOSIS — F411 Generalized anxiety disorder: Secondary | ICD-10-CM

## 2012-02-12 DIAGNOSIS — E7800 Pure hypercholesterolemia, unspecified: Secondary | ICD-10-CM

## 2012-02-12 DIAGNOSIS — K219 Gastro-esophageal reflux disease without esophagitis: Secondary | ICD-10-CM

## 2012-02-12 DIAGNOSIS — K5730 Diverticulosis of large intestine without perforation or abscess without bleeding: Secondary | ICD-10-CM

## 2012-02-12 DIAGNOSIS — N259 Disorder resulting from impaired renal tubular function, unspecified: Secondary | ICD-10-CM

## 2012-02-12 DIAGNOSIS — M1990 Unspecified osteoarthritis, unspecified site: Secondary | ICD-10-CM

## 2012-02-12 DIAGNOSIS — I359 Nonrheumatic aortic valve disorder, unspecified: Secondary | ICD-10-CM

## 2012-02-12 MED ORDER — ALPRAZOLAM 0.25 MG PO TABS: ORAL_TABLET | ORAL | Status: DC

## 2012-02-12 MED ORDER — LEVOTHYROXINE SODIUM 125 MCG PO TABS: ORAL_TABLET | ORAL | Status: DC

## 2012-02-12 MED ORDER — FUROSEMIDE 40 MG PO TABS: ORAL_TABLET | ORAL | Status: DC

## 2012-02-12 NOTE — Progress Notes (Signed)
Subjective:    Patient ID: [name], female    DOB: 08-12-1915, 78 y.o.   MRN: [id number]  HPI 78 y/o WF here for a follow up visit... she has mult med problems including:  Hx asthmatic bronchitis;  HBP;  Ven Insuffic & edema;  Hyperchol;  Hypothyroid;  GERD/ Divertics/ Colon polyps;  Renal insuffic;  DJD;  Vit D defic;  Anxiety...   ~  October 15, 2010:  she went to ER 1/17 w/ epig pain x several hrs- resolved spont & no recurrence... they did CTAbd (bilat renal cysts & bilat adnexal lesions- left is larger than prev> prev eval/ followed by [name]); and labs w/ BUN=56, Creat=2.67... given Flagyl & told to f/u here> we will decr her diuretic Rx to Demadex 20mg /d (stop Aldactone) & reinforce no salt, elevation, support hose to rx edema...  ~  December 19, 2010:  2 month ROV & add-on for incr swelling in the legs right>left;  Known venous insuffic & supposedly on low sodium diet, elevation, support hose, & Lasix  20mg - 2am & 1pm currently;  She had neg venous dopplers w/o DVT etc;  f/u labs today w/ BUN=31, Creat=1.4 (improved) & we decided to try incr to 40mg Bid & refer to VVS for their opinion;  We will check 2DEchocardiogram as well ==> modLVH, normLVF w/ EF=60-65% & norm wall motion, Gr 1 DD, abn AoV w/ ?Lambl's excrescence ==> sent to Cards for review.    AB>  On Mucinex 1-2 Bid w/ fluids;  Exacerbations treated w/ antibiotics, prednisone, tussionex when needed;  CXR today showed NAD (no edema), prob granuloma, incr markings, & ?LLL nodular opacity will be followed up...    HBP>  Controlled on Diltiazem, Lasix, & BP= 132/66;  Denies CP, palpit, dizzy, ch in SOB;  +edema r>L as noted above...    Chol>  On Zocor 20mg /d from [employer];  He has followed her FLPs but is retiring & she has requested that we follow up her labs in the future...    Hypothyroid>  On Synthroid 125mg - 1/2 tab daily;  Labs today showed TSH= 2.81, stable, continue same...    Renal insuffic>  Renal func improved w/  stopping demadex/ aldactone (changed to Lasix now at 60mg /d w/ BUN 31, Creat 1.4);  We decided to incr to 40mg  Bid due to her edema & we will watch the renal function (note: BNP= 71)...  ~  Jan 30, 2011:  6wk ROV & she was seen by [name] for Cards 4/12> she did some blood work w/ Creat=1.8, Low titer ANA, Sed=53, & Event Recorder pending ("there was nothing life threatening, nothing to worry about" she says)...  She also saw [name] 4/12 for VVS> VenDuplex confirmed reflux in deep sys on right & ablation of right GSV wouldn't help- she reports that there is nothing that they can do & he rec continued Lasix, elevation, no salt, compression hose, & f/u prn...  She complains of legs itching & we discussed a topical hydrocort cream + Atarax as needed...   Her CC today is pain in hips & legs making it hard for her to rest at night> she was prev eval by [name]- he sent her for shots by [name] (no benefit she says) so he wanted her to return to [name] but he's said there is nothing he can do> they did not offer review by Rheumatologist or Pain clinic assessment (we will proceed w/ consult from [name])...  ~  March 13, 2011:  6wk ROV &  she is feeling some better, swelling has diminished w/ Lasix 80mg /d & dermatitis resolved w/ HC cream;  wt is down 10# to 149#;  We reviewed current med Rx & decided to continue same & f/u in several monthe w/ f/u labs...    She also saw [name] last week> note pending, pt says they XRayed her hand & did blood work, called for records from [name], and has f/u 04/03/11...  ~  June 13, 2011:  645mo ROV & she has lost another 9# down to 140# & edema resolved; still on Lasix 80mg  Qam & she saw [name] 05/29/11 w/ labs showing BUN=33, Creat=1.47; we decided to decr the Lasix to 40mg /d & may incr to 80mg  Prn...    AB> stable, no recent exac, no regular meds required & she uses the Mucinex as needed...    HBP> controlled on Diltiazem180 & Furosemide 40mg -2Qam w/ BP=148/64  today & she denies CP, palpit, dizzy, SOB, or edema at present...    VI, Edema> she knows to elim sodium, elevate, wear support hose; she has been seen by VVS, [name] & there is nothing they can do; edema decr w/ Lasix 80mg /d...    CHOL> prev followed by [name]; on Simva20 Qhs + low fat diet; we discussed FASTING blood work on ret...    Hypothy> stable on Synthroid - 1/2 tab daily; labs 3/12 showed TSH= 2.81     Renal Insuffic> Creat had increased to 1.8 on the Lasix 80mg ; repeat labs 05/29/11 by [name] showed BUN=33, Creat=1.47, assoc w/ resolved edema therefore decr Lasix to 40mg - 1to2 Qam...    DJD> followed by [name] on Vicodin, Allopurinol100 (Uric=5.9), & Colcrys0.6mg  one daily...  ~  October 15, 2011:  45mo ROV & [name] continues to lose some wt- down 7# further to 133# today, appetite is ok, eating fair & encouraged to add supplements like Ensure vs Boost etc at least Bid;  Meds reviewed & unchanged;  She had f/u [name] 12/12 for Rheum- OA, Gout, bursitis, renal insuffic makes avoiding NSAIDs imperitive & rec to try Aspercreme...    Her breathing is stable w/o interval resp exac...    BP is stable on Diltiazem & Lasix, but reads 160/76 today (hasn't taken meds yet); we reviewed diet, no salt, etc...    FLP controlled on Simva20 but she hasn't remembered to come fasting for recheck FLP; continue diet, exercise & the same med for now...    She is c/o urinary symptoms and we discussed trial of ENABLEX 7.5mg  to see if this helps; mild renal insuffic is improved on ntodays labs w/ Creat=1.3  ~  May 22,2013:  45mo ROV & [name] is feeling well, no new complaints or concerns;  Her main prob= arthritis esp hands & bilat hips- she is followed by [name] & seen recently w/ shot in painful right hip; prev had ESI in back from [name]; prev Uric= 6.0 on Allopurinol, Colchicine, Vicodin... We reviewed her prob list, meds, xrays and labs> see below>> LABS 5/13:  FLP- at goals w/  good HDL on Simva20;  Chems- wnl;  CBC- ok w/ Hg=12.4.Marland KitchenMarland Kitchen          Problem List:  GLAUCOMA (ICD-365.9) - hx mult eye problems w/ prev corneal transplant & glaucoma laser eye surg... on eye drops per Ophthalmology at [facility]...  ASTHMATIC BRONCHITIS, ACUTE (ICD-466.0) - Hx AB requiring Rx w/ Depo/ Pred/ Mucinex/ Tussionex... ~  CXR 4/11 showed vague nodular densities on left... ~  8/11:  she went  to an [facility] w/ cough, sputum, & dx w/ pneumonia rx w/ ZPak... ~  CXR 3/12 showed borderline cardiomeg, nodular opac left base, NAD... ~  Spring 2013: she notes some allergy symptoms...  HYPERTENSION (ICD-401.9) - controlled on ASA 81mg /d,  CARTIAXT 180mg /d, LASIX 40mg - 2Qam... ~  7/10:  labs showed BUN=54, Creat=2.0 & rec to decr diuretics in half= one Demedex & 1/2 Aldactone. ~  11/10:  labs showed BUN=38, Creat=1.8 & rec to keep same meds. ~  8/11:  labs showed BUN=42, Creat=2.1, K=4.3 ~  1/12:  labs in ER showed BUN=56, Creat=2.67, therefore diuretics decr to Demadex20/d only. ~  5/12:  f/u labs in office showed BUN=45, Creat=1.8, on Lasix 80mg  Qam for her edema... ~  9/12:  Labs by [facility] 9/12 showed BUN=33, Creat=1.47, edema resolved therefore decr Lasix to 40mg /d. ~  1/13:  BP= 160/76 but hasn't taken meds today; labs improved w/ BUN=26, Creat=1.3, BNP=50 ~  5/13:  BP= 140/78 & denies CP, palpit, SOB, edema...  PALPITATIONS, HX OF (ICD-V12.50)  VENOUS INSUFFICIENCY (ICD-459.81) - swelling controlled w/ diuretics, low sodium diet, elevation, support hose, etc... ~  She saw [name] VVS- there was nothing he could do...  HYPERCHOLESTEROLEMIA (ICD-272.0) - prev Rx'd by [name]... Now on ZOCOR 20mg /d (prev Crestor5 & Lescol from [facility]) ~  she brought labs from [facility] 5/10 (off med due to $$$) w/ TChol 285, TG 204, HDL 69, LDL 195... "he was mad at me" ~  FLP 5/11 from [facility] showed TChol 202, TG 60, HDL 112, LDL 99 ~  She is reminded (again) to come FASTING for f/u  FLP...  HYPOTHYROIDISM (ICD-244.9) - prev followed by [name] on SYNTHROID - 1/2 daily... ~  pt brought labs from [facility] 5/10 w/ TSH= 2.90, FreeT4= 1.09 ~  labs 8/11 showed TSH= 1.87 ~  Labs 3/12 showed TSH= 2.81 ~  Labs 1/13 showed TSH= 1.65  GERD (ICD-530.81) - uses PRILOSEC 20mg /d... last EGD was 6/06 by DrPatterson showing 3cmHH, reflux...   DIVERTICULOSIS OF COLON (ICD-562.10) & COLONIC POLYPS (ICD-211.3) - last colonoscopy 6/06 was WNL- no abnormality seen...  RENAL INSUFFICIENCY (ICD-588.9) - tough balance betw renal perfusion & diuresis for edema... ~  labs 10/08 showed BUN= 27, Creat= 1.5 ~  labs 8/09 showed BUN= 44, Creat= 1.9 ~  labs 2/10 showed BUN 46, Creat= 1.7 ~  labs 7/10 showed BUN= 54, Creat= 2.0 ~  labs 11/10 showed BUN= 38, Creat= 1.8 ~  labs 8/11 showed BUN= 42, Creat= 2.1 ~  labs in ER 1/12 showed BUN=56, Creat=2.67, rec decr diuretics to Demadex20mg /d only. ~  Labs 3/12 showed improved renal function w/ BUN=31, Creat=1.4... ~  Labs 5/12 on Lasix80 showed BUN=45, Creat=1.8 ~  Labs 9/12 by [name] showed BUN=33, Creat=1.47 ~  Labs 1/13 showed BUN=26, Creat=1.3, BNP=50... On Lasix 40mg  Qam. ~  Labs 5/13 showed BUN=28, Creat=1.2  DEGENERATIVE JOINT DISEASE (ICD-715.90) - this is her chief complaint- s/p right TKR in 1999,  left TKR 3/10... on [facility] 7.5mg  Prn & VICODIN Prn as well... followed by [name]; and had right second toe amp by Volney Presser 2010 for hammertoe + spurs... also had epidural steroid shot from [facility] for LBP (without benefit she says)... ~  5/13:  She saw [name] for Rheum w/ shot in hip & sl improved...  VITAMIN D DEFICIENCY (ICD-268.9) ~  labs 8/09 showed Vit D level = 12... rec- start Vit D 50,000 u weekly... ~  labs 2/10 showed Vit D level = 30... rec- continue Vit  D 50K weekly... ~  labs 7/10 showed Vit D level = 39... rec- change to 1000 u OTC daily. ~  labs 8/11 showed Vit D level = 38... continue same.  ANXIETY (ICD-300.00) -  on ALPRAZOLAM 0.25mg Tid Prn... several deaths in the family... daughter who lives w/ her recently ill...  SHINGLES - she had right T9-10 shingles in 2011 & resolved w/ Valtrex, Pred, etc...   Past Surgical History  Procedure Date  . Cataract extraction   . Abdominal hysterectomy   . Breast biopsy     Benign  . Total knee arthroplasty 1999    right  . Total knee arthroplasty 11/2008    left - Dr Ophelia Charter  . Toe amputation 08/2009    Right second toe - Dr Lestine Box    Outpatient Encounter Prescriptions as of 02/12/2012  Medication Sig Dispense Refill  . allopurinol (ZYLOPRIM) 100 MG tablet Take 100 mg by mouth daily.        [name] ALPRAZolam (XANAX) 0.25 MG tablet Take 1/2 to 1 tablet By mouth three times a day as needed for nerves  90 tablet  5  . Ascorbic Acid (VITAMIN C) 500 MG tablet Take 500 mg by mouth daily.        [name] aspirin 81 MG tablet Take 81 mg by mouth daily.        . Cholecalciferol (VITAMIN D) 1000 UNITS capsule Take 1,000 Units by mouth daily.        . colchicine 0.6 MG tablet Take 0.6 mg by mouth daily.        [name] diltiazem (CARDIZEM CD) 180 MG 24 hr capsule TAKE 1 CAPSULE DAILY  30 capsule  5  . ferrous sulfate 325 (65 FE) MG tablet Take 325 mg by mouth daily with breakfast.      . fluorometholone (FML) 0.1 % ophthalmic suspension One drop in right eye at bedtime       . furosemide (LASIX) 40 MG tablet Take 1-2 tablets by mouth daily      . guaiFENesin (MUCINEX) 600 MG 12 hr tablet Take 1,200 mg by mouth 2 (two) times daily.        [name] HYDROcodone-acetaminophen (VICODIN) 5-500 MG per tablet Take 1/2 to 1 By mouth three times a day as needed for pain  90 tablet  5  . levothyroxine (SYNTHROID, LEVOTHROID) 125 MCG tablet Take 1/2 tablet By mouth once daily  90 tablet  3  . omeprazole (PRILOSEC) 20 MG capsule Take 20 mg by mouth daily.        Bertram Gala Glycol-Propyl Glycol (SYSTANE) 0.4-0.3 % SOLN One drop in each eye two times a day       . simvastatin (ZOCOR) 20 MG tablet TAKE 1  TAB BY MOUTH AT BEDTIME  90 tablet  3  . timolol (TIMOPTIC) 0.5 % ophthalmic solution Place 1 drop into both eyes 2 (two) times daily.          Allergies  Allergen Reactions  . Enablex (Darifenacin Hydrobromide Er)     Caused her throat and mouth to have bumps and feel like it was swelling  . Clarithromycin     REACTION: causes her mouth to swell  . Codeine     Makes pt nervous  . Morphine     REACTION: n/v  . Sulfonamide Derivatives     REACTION: unsure of reaction    Current Medications, Allergies, Past Medical History, Past Surgical History, Family History, and Social History were reviewed in [facility] electronic medical record.    Review of Systems        See HPI - all other systems neg except as noted...  The patient complains of dyspnea on exertion, peripheral edema, muscle weakness, and difficulty walking.  The patient denies anorexia, fever, weight loss, weight gain, vision loss, decreased hearing, hoarseness, chest pain, syncope, prolonged cough, headaches, hemoptysis, abdominal pain, melena, hematochezia, severe indigestion/heartburn, hematuria, incontinence, genital sores, suspicious skin lesions, transient blindness, depression, unusual weight change, abnormal bleeding, enlarged lymph nodes, and angioedema.     Objective:   Physical Exam      WD, WN, 78 y/o WF in NAD... GENERAL:  Alert & oriented; pleasant & cooperative... HEENT:  [place]/AT, EOM- full, EACs-clear, TMs-wnl, NOSE-clear, THROAT-clear & wnl. NECK:  Supple w/ fairROM; no JVD; normal carotid impulses w/o bruits; no thyromegaly or nodules palpated; no lymphadenopathy. CHEST:  Clear, decr BS bilat w/o wheezing, rales, or rhonchi heard... HEART:  Regular Rhythm;  gr 1/6 SEM without rubs or gallops detected... ABDOMEN:  Soft & nontender; normal bowel sounds; no organomegaly or masses detected. EXT:  moderate arthritic changes; s/p bilat TKRs; mild varicose veins/ +venous insuffic/ 2+edema R>L Right leg sl  red/ inflammed, s/p right second toe distal amputation... NEURO:  CN's intact;  no focal neuro deficits... DERM:  No lesions noted; no rash etc...  RADIOLOGY DATA:  Reviewed in the EPIC EMR & discussed w/ the patient...    >>Last CXR 3/12 showed scat granulomas, ?left base nodule, right side clear, borderline cardiomegaly...  LABORATORY DATA:  Reviewed in the EPIC EMR & discussed w/ the patient...    >>Labs 1/13 reviewed...   Assessment & Plan:   HBP>  Controlled on meds, continue same including the Lasix to 40mg  Qam...  Cardiac>  DrRoss' note reviewed; see 2DEcho, Event Monitor reports no dangerous arrhythmias, continue meds...  Ven Insuffic/ EDEMA>  evals by Cards & VVS> "there is nothing they can do"; continue elevation, no salt, Lasix, compression...  CHOL>  On Simva20 per [name]> needs fasting labs on ret...  HYPOTHYROID>  Continue Synthroid 153mcg/d taking 1/2 tab per [name]...  GI>  Stable & followed by DrPatterson...  Renal Insuffic>  Careful w/ diuresis, NSAIDs etc; Creat improved to 1.3 on lower dose of Lasix.  DJD>  She saw [name] for her Rheum complaints==> shot in knee; [name]/ Alvester Morin have signed off; may need pain clinic....  Other problems as noted...   Patient's Medications  New Prescriptions   No medications on file  Previous Medications   ALLOPURINOL (ZYLOPRIM) 100 MG TABLET    Take 100 mg by mouth daily.     ASCORBIC ACID (VITAMIN C) 500 MG TABLET    Take 500 mg by mouth daily.     ASPIRIN 81 MG TABLET    Take 81 mg by mouth daily.     CHOLECALCIFEROL (VITAMIN D) 1000 UNITS CAPSULE    Take 1,000 Units by mouth daily.     COLCHICINE 0.6 MG TABLET    Take 0.6 mg by mouth daily.     DILTIAZEM (CARDIZEM CD) 180 MG 24 HR CAPSULE    TAKE 1 CAPSULE DAILY   FERROUS SULFATE 325 (65 FE) MG TABLET    Take 325 mg by mouth daily with breakfast.   FLUOROMETHOLONE (FML) 0.1 % OPHTHALMIC SUSPENSION    One drop in right eye at bedtime    GUAIFENESIN (MUCINEX)  600 MG 12 HR TABLET    Take 1,200 mg by mouth 2 (two) times  daily.     HYDROCODONE-ACETAMINOPHEN (VICODIN) 5-500 MG PER TABLET    Take 1/2 to 1 By mouth three times a day as needed for pain   OMEPRAZOLE (PRILOSEC) 20 MG CAPSULE    Take 20 mg by mouth daily.     POLYETHYL GLYCOL-PROPYL GLYCOL (SYSTANE) 0.4-0.3 % SOLN    One drop in each eye two times a day    SIMVASTATIN (ZOCOR) 20 MG TABLET    TAKE 1 TAB BY MOUTH AT BEDTIME   TIMOLOL (TIMOPTIC) 0.5 % OPHTHALMIC SOLUTION    Place 1 drop into both eyes 2 (two) times daily.    Modified Medications   Modified Medication Previous Medication   ALPRAZOLAM (XANAX) 0.25 MG TABLET ALPRAZolam (XANAX) 0.25 MG tablet      Take 1/2 to 1 tablet By mouth three times a day as needed for nerves    Take 1/2 to 1 tablet By mouth three times a day as needed for nerves   FUROSEMIDE (LASIX) 40 MG TABLET furosemide (LASIX) 40 MG tablet      Take 1-2 tablets by mouth daily    Take 1-2 tablets by mouth daily   LEVOTHYROXINE (SYNTHROID, LEVOTHROID) 125 MCG TABLET levothyroxine (SYNTHROID, LEVOTHROID) 125 MCG tablet      Take 1/2 tablet By mouth once daily    Take 1/2 tablet By mouth once daily  Discontinued Medications   No medications on file

## 2012-02-12 NOTE — Patient Instructions (Signed)
Today we updated your med list in our EPIC system...    Continue your current medications the same...  We reviewed your recent blood work & it all looks good...  Stay as active as possible & walk more if you are able...  Call for any questions...  Let's plana follow up visit in 4 months.Marland KitchenMarland Kitchen

## 2012-03-21 ENCOUNTER — Other Ambulatory Visit: Payer: Self-pay | Admitting: Pulmonary Disease

## 2012-03-25 ENCOUNTER — Other Ambulatory Visit: Payer: Self-pay | Admitting: Pulmonary Disease

## 2012-04-10 ENCOUNTER — Telehealth: Payer: Self-pay | Admitting: Pulmonary Disease

## 2012-04-10 MED ORDER — MECLIZINE HCL 25 MG PO TABS: 25.0000 mg | ORAL_TABLET | ORAL | Status: AC | PRN

## 2012-04-10 NOTE — Telephone Encounter (Signed)
Called and spoke with pt and she stated that she is having some dizziness  X 2 days.  Has taken otc dizziness med but is afraid to keep taking this due to her glaucoma.  Pt is requesting that SN call something in for her.  SN please advise. Thanks  Allergies  Allergen Reactions  . Enablex (Darifenacin Hydrobromide Er)     Caused her throat and mouth to have bumps and feel like it was swelling  . Clarithromycin     REACTION: causes her mouth to swell  . Codeine     Makes pt nervous  . Morphine     REACTION: n/v  . Sulfonamide Derivatives     REACTION: unsure of reaction

## 2012-04-10 NOTE — Telephone Encounter (Signed)
Per SN: antivert 25mg  #30 1/2-1 tab by mouth every 4 hours as needed for dizziness.  Refill x 4.  Thanks.  Called spoke with patient, advised of SN's recs about the meclizine.  Pt okay with these recs as verbalized her understanding.  Rx sent to verified pharmacy.

## 2012-04-16 ENCOUNTER — Encounter: Payer: Self-pay | Admitting: Internal Medicine

## 2012-04-16 ENCOUNTER — Ambulatory Visit (INDEPENDENT_AMBULATORY_CARE_PROVIDER_SITE_OTHER): Payer: Medicare Other | Admitting: Internal Medicine

## 2012-04-16 ENCOUNTER — Telehealth: Payer: Self-pay | Admitting: Pulmonary Disease

## 2012-04-16 ENCOUNTER — Other Ambulatory Visit (INDEPENDENT_AMBULATORY_CARE_PROVIDER_SITE_OTHER): Payer: Medicare Other

## 2012-04-16 VITALS — BP 122/70 | HR 87 | Temp 98.6°F | Ht 63.0 in | Wt 136.0 lb

## 2012-04-16 DIAGNOSIS — R42 Dizziness and giddiness: Secondary | ICD-10-CM

## 2012-04-16 LAB — CBC WITH DIFFERENTIAL/PLATELET
Basophils Absolute: 0 10*3/uL (ref 0.0–0.1)
Eosinophils Absolute: 0 10*3/uL (ref 0.0–0.7)
Lymphocytes Relative: 34.7 % (ref 12.0–46.0)
MCHC: 32.7 g/dL (ref 30.0–36.0)
Neutro Abs: 3.7 10*3/uL (ref 1.4–7.7)
Neutrophils Relative %: 56.8 % (ref 43.0–77.0)
RDW: 15.6 % — ABNORMAL HIGH (ref 11.5–14.6)

## 2012-04-16 LAB — BASIC METABOLIC PANEL
CO2: 30 mEq/L (ref 19–32)
Calcium: 9.5 mg/dL (ref 8.4–10.5)
Creatinine, Ser: 1.3 mg/dL — ABNORMAL HIGH (ref 0.4–1.2)
Glucose, Bld: 102 mg/dL — ABNORMAL HIGH (ref 70–99)

## 2012-04-16 NOTE — Telephone Encounter (Signed)
Ov with MW at 3 pm today

## 2012-04-16 NOTE — Patient Instructions (Addendum)
Try full dose meclizine up to every 4 hours and if not effective ok to stop it and try xanax in max dose   Please remember to go to the lab  department downstairs for your tests - we will call you with the results when they are available.  If nauseated or bad headache develops, go to ER

## 2012-04-16 NOTE — Progress Notes (Signed)
Subjective:    Patient ID: [name], female    DOB: [birth year]    MRN: [id number]  HPI 89 yowf   With asthmatic bronchitis;  HBP;  Ven Insuffic & edema;  Hyperchol;  Hypothyroid;  GERD/ Divertics/ Colon polyps;  Renal insuffic;  DJD;  Vit D defic;  Anxiety...   ~  October 15, 2010:  she went to ER 1/17 w/ epig pain x several hrs- resolved spont & no recurrence... they did CTAbd (bilat renal cysts & bilat adnexal lesions- left is larger than prev> prev eval/ followed by [name]); and labs w/ BUN=56, Creat=2.67... given Flagyl & told to f/u here> we will decr her diuretic Rx to Demadex 20mg /d (stop Aldactone) & reinforce no salt, elevation, support hose to rx edema...  ~  December 19, 2010:  2 month ROV & add-on for incr swelling in the legs right>left;  Known venous insuffic & supposedly on low sodium diet, elevation, support hose, & Lasix  20mg - 2am & 1pm currently;  She had neg venous dopplers w/o DVT etc;  f/u labs today w/ BUN=31, Creat=1.4 (improved) & we decided to try incr to 40mg Bid & refer to [facility] for their opinion;  We will check 2DEchocardiogram as well ==> modLVH, normLVF w/ EF=60-65% & norm wall motion, Gr 1 DD, abn AoV w/ ?Lambl's excrescence ==> sent to Cards for review.    AB>  On Mucinex 1-2 Bid w/ fluids;  Exacerbations treated w/ antibiotics, prednisone, tussionex when needed;  CXR today showed NAD (no edema), prob granuloma, incr markings, & ?LLL nodular opacity will be followed up...    HBP>  Controlled on Diltiazem, Lasix, & BP= 132/66;  Denies CP, palpit, dizzy, ch in SOB;  +edema r>L as noted above...    Chol>  On Zocor 20mg /d from [employer];  He has followed her FLPs but is retiring & she has requested that we follow up her labs in the future...    Hypothyroid>  On Synthroid 125mg - 1/2 tab daily;  Labs today showed TSH= 2.81, stable, continue same...    Renal insuffic>  Renal func improved w/ stopping demadex/ aldactone (changed to Lasix now at 60mg /d w/ BUN 31, Creat  1.4);  We decided to incr to 40mg  Bid due to her edema & we will watch the renal function (note: BNP= 71)...  ~  Jan 30, 2011:  6wk ROV & she was seen by [name] for Cards 4/12> she did some blood work w/ Creat=1.8, Low titer ANA, Sed=53, & Event Recorder pending ("there was nothing life threatening, nothing to worry about" she says)...  She also saw [name] 4/12 for VVS> VenDuplex confirmed reflux in deep sys on right & ablation of right GSV wouldn't help- she reports that there is nothing that they can do & he rec continued Lasix, elevation, no salt, compression hose, & f/u prn...  She complains of legs itching & we discussed a topical hydrocort cream + Atarax as needed...   Her CC today is pain in hips & legs making it hard for her to rest at night> she was prev eval by [name]- he sent her for shots by [name] (no benefit she says) so he wanted her to return to [name] but he's said there is nothing he can do> they did not offer review by Rheumatologist or Pain clinic assessment (we will proceed w/ consult from [name])...  ~  March 13, 2011:  6wk ROV & she is feeling some better, swelling has diminished w/ Lasix 80mg /d &  dermatitis resolved w/ HC cream;  wt is down 10# to 149#;  We reviewed current med Rx & decided to continue same & f/u in several monthe w/ f/u labs...    She also saw [name] last week> note pending, pt says they XRayed her hand & did blood work, called for records from [place], and has f/u 04/03/11...  ~  June 13, 2011:  87mo ROV & she has lost another 9# down to 140# & edema resolved; still on Lasix 80mg  Qam & she saw [name] 05/29/11 w/ labs showing BUN=33, Creat=1.47; we decided to decr the Lasix to 40mg /d & may incr to 80mg  Prn...    AB> stable, no recent exac, no regular meds required & she uses the Mucinex as needed...    HBP> controlled on Diltiazem180 & Furosemide 40mg -2Qam w/ BP=148/64 today & she denies CP, palpit, dizzy, SOB, or edema at present...    VI,  Edema> she knows to elim sodium, elevate, wear support hose; she has been seen by VVS, [name] & there is nothing they can do; edema decr w/ Lasix 80mg /d...    CHOL> prev followed by [name]; on Simva20 Qhs + low fat diet; we discussed FASTING blood work on ret...    Hypothy> stable on Synthroid - 1/2 tab daily; labs 3/12 showed TSH= 2.81     Renal Insuffic> Creat had increased to 1.8 on the Lasix 80mg ; repeat labs 05/29/11 by [name] showed BUN=33, Creat=1.47, assoc w/ resolved edema therefore decr Lasix to 40mg - 1to2 Qam...    DJD> followed by [name] on Vicodin, Allopurinol100 (Uric=5.9), & Colcrys0.6mg  one daily...  ~  October 15, 2011:  249mo ROV & [name] continues to lose some wt- down 7# further to 133# today, appetite is ok, eating fair & encouraged to add supplements like Ensure vs Boost etc at least Bid;  Meds reviewed & unchanged;  She had f/u [name] 12/12 for Rheum- OA, Gout, bursitis, renal insuffic makes avoiding NSAIDs imperitive & rec to try Aspercreme...    Her breathing is stable w/o interval resp exac...    BP is stable on Diltiazem & Lasix, but reads 160/76 today (hasn't taken meds yet); we reviewed diet, no salt, etc...    FLP controlled on Simva20 but she hasn't remembered to come fasting for recheck FLP; continue diet, exercise & the same med for now...    She is c/o urinary symptoms and we discussed trial of ENABLEX 7.5mg  to see if this helps; mild renal insuffic is improved on ntodays labs w/ Creat=1.3  ~  May 22,2013:  249mo ROV & [name] is feeling well, no new complaints or concerns;  Her main prob= arthritis esp hands & bilat hips- she is followed by [name] & seen recently w/ shot in painful right hip; prev had ESI in back from [name]; prev Uric= 6.0 on Allopurinol, Colchicine, Vicodin... We reviewed her prob list, meds, xrays and labs> see below>> LABS 5/13:  FLP- at goals w/ good HDL on Simva20;  Chems- wnl;  CBC- ok w/ Hg=12.4...   04/16/12 Acute  OV [name]   With remote h/o vertigo cc acute onset recurrent dizziness  comes and goes over a matter of a few secs to maybe a minute x 2 wks. She has tried taking meclizine and this does not help much. She states dizziness occurs at any time without any specific trigger. Worse when head hits the pillow esp > rx  meclizine 25 mg one half 4 x times in 24 hours no benefit,  worse this am no ha, viz changes or nausea. Also taking xanax 0.25 mg one half at lunch and a whole weekend. Able to ambulate about normal for her  No weakness, slurred speech, ear ache or tinnitis/ hearing loss. Also denies any obvious fluctuation of symptoms with weather or environmental changes or other aggravating or alleviating factors except as outlined above    ROS  The following are not active complaints unless bolded sore throat, dysphagia, dental problems, itching, sneezing,  nasal congestion or excess/ purulent secretions, ear ache,   fever, chills, sweats, unintended wt loss, pleuritic or exertional cp, hemoptysis,  orthopnea pnd or leg swelling, presyncope, palpitations, heartburn, abdominal pain, anorexia, nausea, vomiting, diarrhea  or change in bowel or urinary habits, change in stools or urine, dysuria,hematuria,  rash, arthralgias, visual complaints, headache, numbness weakness or ataxia or problems with walking or coordination,  change in mood/affect or memory.              Problem List:  GLAUCOMA (ICD-365.9) - hx mult eye problems w/ prev corneal transplant & glaucoma laser eye surg... on eye drops per Ophthalmology at [facility] ([place])...  ASTHMATIC BRONCHITIS, ACUTE (ICD-466.0) - Hx AB requiring Rx w/ Depo/ Pred/ Mucinex/ Tussionex... ~  CXR 4/11 showed vague nodular densities on left... ~  8/11:  she went to an [facility] w/ cough, sputum, & dx w/ pneumonia rx w/ ZPak... ~  CXR 3/12 showed borderline cardiomeg, nodular opac left base, NAD... ~  Spring 2013: she notes some allergy symptoms...  HYPERTENSION (ICD-401.9) -  controlled on ASA 81mg /d,  CARTIAXT 180mg /d, LASIX 40mg - 2Qam... ~  7/10:  labs showed BUN=54, Creat=2.0 & rec to decr diuretics in half= one Demedex & 1/2 Aldactone. ~  11/10:  labs showed BUN=38, Creat=1.8 & rec to keep same meds. ~  8/11:  labs showed BUN=42, Creat=2.1, K=4.3 ~  1/12:  labs in ER showed BUN=56, Creat=2.67, therefore diuretics decr to Demadex20/d only. ~  5/12:  f/u labs in office showed BUN=45, Creat=1.8, on Lasix 80mg  Qam for her edema... ~  9/12:  Labs by [facility] 9/12 showed BUN=33, Creat=1.47, edema resolved therefore decr Lasix to 40mg /d. ~  1/13:  BP= 160/76 but hasn't taken meds today; labs improved w/ BUN=26, Creat=1.3, BNP=50 ~  5/13:  BP= 140/78 & denies CP, palpit, SOB, edema...  PALPITATIONS, HX OF (ICD-V12.50)  VENOUS INSUFFICIENCY (ICD-459.81) - swelling controlled w/ diuretics, low sodium diet, elevation, support hose, etc... ~  She saw [name] [facility]- there was nothing he could do...  HYPERCHOLESTEROLEMIA (ICD-272.0) - prev Rx'd by [name]... Now on ZOCOR 20mg /d (prev Crestor5 & Lescol from [facility]) ~  she brought labs from [facility] 5/10 (off med due to $$$) w/ TChol 285, TG 204, HDL 69, LDL 195... "he was mad at me" ~  FLP 5/11 from [facility] showed TChol 202, TG 60, HDL 112, LDL 99 ~  She is reminded (again) to come FASTING for f/u FLP...  HYPOTHYROIDISM (ICD-244.9) - prev followed by [name] on SYNTHROID - 1/2 daily... ~  pt brought labs from [facility] 5/10 w/ TSH= 2.90, FreeT4= 1.09 ~  labs 8/11 showed TSH= 1.87 ~  Labs 3/12 showed TSH= 2.81 ~  Labs 1/13 showed TSH= 1.65  GERD (ICD-530.81) - uses PRILOSEC 20mg /d... last EGD was 6/06 by DrPatterson showing 3cmHH, reflux...   DIVERTICULOSIS OF COLON (ICD-562.10) & COLONIC POLYPS (ICD-211.3) - last colonoscopy 6/06 was WNL- no abnormality seen...  RENAL INSUFFICIENCY (ICD-588.9) - tough balance betw renal perfusion & diuresis for  edema... ~  labs 10/08 showed BUN= 27, Creat= 1.5 ~  labs 8/09 showed  BUN= 44, Creat= 1.9 ~  labs 2/10 showed BUN 46, Creat= 1.7 ~  labs 7/10 showed BUN= 54, Creat= 2.0 ~  labs 11/10 showed BUN= 38, Creat= 1.8 ~  labs 8/11 showed BUN= 42, Creat= 2.1 ~  labs in ER 1/12 showed BUN=56, Creat=2.67, rec decr diuretics to Demadex20mg /d only. ~  Labs 3/12 showed improved renal function w/ BUN=31, Creat=1.4... ~  Labs 5/12 on Lasix80 showed BUN=45, Creat=1.8 ~  Labs 9/12 by [name] showed BUN=33, Creat=1.47 ~  Labs 1/13 showed BUN=26, Creat=1.3, BNP=50... On Lasix 40mg  Qam. ~  Labs 5/13 showed BUN=28, Creat=1.2  DEGENERATIVE JOINT DISEASE (ICD-715.90) - this is her chief complaint- s/p right TKR in 1999,  left TKR 3/10... on MOBIC 7.5mg  Prn & VICODIN Prn as well... followed by [name]; and had right second toe amp by Volney Presser 2010 for hammertoe + spurs... also had epidural steroid shot from [facility] for LBP (without benefit she says)... ~  5/13:  She saw [name] for Rheum w/ shot in hip & sl improved...  VITAMIN D DEFICIENCY (ICD-268.9) ~  labs 8/09 showed Vit D level = 12... rec- start Vit D 50,000 u weekly... ~  labs 2/10 showed Vit D level = 30... rec- continue Vit D 50K weekly... ~  labs 7/10 showed Vit D level = 39... rec- change to 1000 u OTC daily. ~  labs 8/11 showed Vit D level = 38... continue same.  ANXIETY (ICD-300.00) - on ALPRAZOLAM 0.25mg Tid Prn... several deaths in the family... daughter who lives w/ her recently ill...  SHINGLES - she had right T9-10 shingles in 2011 & resolved w/ Valtrex, Pred, etc...   Past Surgical History  Procedure Date  . Cataract extraction   . Abdominal hysterectomy   . Breast biopsy     Benign  . Total knee arthroplasty 1999    right  . Total knee arthroplasty 11/2008    left - Dr Ophelia Charter  . Toe amputation 08/2009    Right second toe - Dr Lestine Box     Objective:   Physical Exam      WD, WN, ambulatory elderly WF in NAD, minimally awkward climbing on exam table GENERAL:  Alert & oriented; pleasant &  cooperative... HEENT:  [place]/AT, EOM- full, EACs-clear, TMs-wnl, NOSE-clear, THROAT-clear & wnl. NECK:  Supple w/ fairROM; no JVD; normal carotid impulses w/o bruits; no thyromegaly or nodules palpated; no lymphadenopathy. CHEST:  Clear, decr BS bilat w/o wheezing, rales, or rhonchi heard... HEART:  Regular Rhythm;  gr 1/6 SEM without rubs or gallops detected... ABDOMEN:  Soft & nontender; normal bowel sounds; no organomegaly or masses detected. EXT:  moderate arthritic changes; s/p bilat TKRs; mild varicose veins/ +venous insuffic/ 2+edema R>L Neuro: min nystagmus elicited, o/w nl exam with nl speech, midline tongue, facial symmetry and no cerebellar findings.    Assessment & Plan:

## 2012-04-17 NOTE — Progress Notes (Signed)
Quick Note:  Called and spoke with patient regarding results and recs as listed below per MW. Patient verbalized understanding and had no further questions or concerns at this time. ______

## 2012-04-18 DIAGNOSIS — R42 Dizziness and giddiness: Secondary | ICD-10-CM | POA: Insufficient documentation

## 2012-04-18 NOTE — Assessment & Plan Note (Signed)
Classic pattern for BPV:  Worst when head hits pillow with minimal nystagmus noted on exam and no symptoms or signs to suggest VB insufficiency.  Discussed options for treatment and referral if needed but for now would use max doses of meclizine and then consider using xanax before referral.  Main concern would be falling, also reviewed.  See instructions for specific recommendations which were reviewed directly with the patient who was given a copy with highlighter outlining the key components.

## 2012-04-26 ENCOUNTER — Other Ambulatory Visit: Payer: Self-pay | Admitting: Pulmonary Disease

## 2012-06-01 DIAGNOSIS — M2040 Other hammer toe(s) (acquired), unspecified foot: Secondary | ICD-10-CM | POA: Diagnosis not present

## 2012-06-17 ENCOUNTER — Ambulatory Visit (INDEPENDENT_AMBULATORY_CARE_PROVIDER_SITE_OTHER): Payer: Medicare Other | Admitting: Pulmonary Disease

## 2012-06-17 ENCOUNTER — Encounter: Payer: Self-pay | Admitting: Pulmonary Disease

## 2012-06-17 VITALS — BP 140/60 | HR 75 | Temp 98.3°F | Ht 63.0 in | Wt 139.4 lb

## 2012-06-17 DIAGNOSIS — I10 Essential (primary) hypertension: Secondary | ICD-10-CM

## 2012-06-17 DIAGNOSIS — Z23 Encounter for immunization: Secondary | ICD-10-CM

## 2012-06-17 DIAGNOSIS — M549 Dorsalgia, unspecified: Secondary | ICD-10-CM

## 2012-06-17 DIAGNOSIS — E039 Hypothyroidism, unspecified: Secondary | ICD-10-CM

## 2012-06-17 DIAGNOSIS — E7800 Pure hypercholesterolemia, unspecified: Secondary | ICD-10-CM | POA: Diagnosis not present

## 2012-06-17 DIAGNOSIS — F411 Generalized anxiety disorder: Secondary | ICD-10-CM

## 2012-06-17 DIAGNOSIS — K5730 Diverticulosis of large intestine without perforation or abscess without bleeding: Secondary | ICD-10-CM

## 2012-06-17 DIAGNOSIS — K219 Gastro-esophageal reflux disease without esophagitis: Secondary | ICD-10-CM

## 2012-06-17 DIAGNOSIS — I872 Venous insufficiency (chronic) (peripheral): Secondary | ICD-10-CM | POA: Diagnosis not present

## 2012-06-17 DIAGNOSIS — D126 Benign neoplasm of colon, unspecified: Secondary | ICD-10-CM

## 2012-06-17 DIAGNOSIS — M1990 Unspecified osteoarthritis, unspecified site: Secondary | ICD-10-CM

## 2012-06-17 DIAGNOSIS — N259 Disorder resulting from impaired renal tubular function, unspecified: Secondary | ICD-10-CM

## 2012-06-17 NOTE — Patient Instructions (Addendum)
Today we updated your med list in our EPIC system...    Continue your current medications the same...  We gave you the 2013 flu vaccine today...  Call for any questions...  Let's plan a follow up visit in 4 months w/ FASTING blood work at that time.Marland KitchenMarland Kitchen

## 2012-06-17 NOTE — Progress Notes (Signed)
Subjective:    Patient ID: [name], female    DOB: 31-Jul-1915, 78 y.o.   MRN: [id number]  HPI 78 y/o WF here for a follow up visit... she has mult med problems including:  Hx asthmatic bronchitis;  HBP;  Ven Insuffic & edema;  Hyperchol;  Hypothyroid;  GERD/ Divertics/ Colon polyps;  Renal insuffic;  DJD;  Vit D defic;  Anxiety...   ~  December 19, 2010:  2 month ROV & add-on for incr swelling in the legs right>left;  Known venous insuffic & supposedly on low sodium diet, elevation, support hose, & Lasix  20mg - 2am & 1pm currently;  She had neg venous dopplers w/o DVT etc;  f/u labs today w/ BUN=31, Creat=1.4 (improved) & we decided to try incr to 40mg Bid & refer to VVS for their opinion;  We will check 2DEchocardiogram as well ==> modLVH, normLVF w/ EF=60-65% & norm wall motion, Gr 1 DD, abn AoV w/ ?Lambl's excrescence ==> sent to Cards for review.    AB>  On Mucinex 1-2 Bid w/ fluids;  Exacerbations treated w/ antibiotics, prednisone, tussionex when needed;  CXR today showed NAD (no edema), prob granuloma, incr markings, & ?LLL nodular opacity will be followed up...    HBP>  Controlled on Diltiazem, Lasix, & BP= 132/66;  Denies CP, palpit, dizzy, ch in SOB;  +edema r>L as noted above...    Chol>  On Zocor 20mg /d from [employer];  He has followed her FLPs but is retiring & she has requested that we follow up her labs in the future...    Hypothyroid>  On Synthroid 125mg - 1/2 tab daily;  Labs today showed TSH= 2.81, stable, continue same...    Renal insuffic>  Renal func improved w/ stopping demadex/ aldactone (changed to Lasix now at 60mg /d w/ BUN 31, Creat 1.4);  We decided to incr to 40mg  Bid due to her edema & we will watch the renal function (note: BNP= 71)...  ~  Jan 30, 2011:  6wk ROV & she was seen by Nunzio Cory for Cards 4/12> she did some blood work w/ Creat=1.8, Low titer ANA, Sed=53, & Event Recorder pending ("there was nothing life threatening, nothing to worry about" she says)...  She also  saw DrEarly 4/12 for [facility]> VenDuplex confirmed reflux in deep sys on right & ablation of right GSV wouldn't help- she reports that there is nothing that they can do & he rec continued Lasix, elevation, no salt, compression hose, & f/u prn...  She complains of legs itching & we discussed a topical hydrocort cream + Atarax as needed...   Her CC today is pain in hips & legs making it hard for her to rest at night> she was prev eval by DrYates- he sent her for shots by DrNewton (no benefit she says) so he wanted her to return to DrYates but he's said there is nothing he can do> they did not offer review by Rheumatologist or Pain clinic assessment (we will proceed w/ consult from DrDeveshwar)...  ~  March 13, 2011:  6wk ROV & she is feeling some better, swelling has diminished w/ Lasix 80mg /d & dermatitis resolved w/ HC cream;  wt is down 10# to 149#;  We reviewed current med Rx & decided to continue same & f/u in several monthe w/ f/u labs...    She also saw DrDeveshwar last week> note pending, pt says they XRayed her hand & did blood work, called for records from [facility], and has f/u 04/03/11...  ~  June 13, 2011:  11mo ROV & she has lost another 9# down to 140# & edema resolved; still on Lasix 80mg  Qam & she saw DrDeveshwar 05/29/11 w/ labs showing BUN=33, Creat=1.47; we decided to decr the Lasix to 40mg /d & may incr to 80mg  Prn...    AB> stable, no recent exac, no regular meds required & she uses the Mucinex as needed...    HBP> controlled on Diltiazem180 & Furosemide 40mg -2Qam w/ BP=148/64 today & she denies CP, palpit, dizzy, SOB, or edema at present...    VI, Edema> she knows to elim sodium, elevate, wear support hose; she has been seen by VVS, DrEarly & there is nothing they can do; edema decr w/ Lasix 80mg /d...    CHOL> prev followed by DrGegick; on Simva20 Qhs + low fat diet; we discussed FASTING blood work on ret...    Hypothy> stable on Synthroid - 1/2 tab daily; labs 3/12 showed TSH= 2.81      Renal Insuffic> Creat had increased to 1.8 on the Lasix 80mg ; repeat labs 05/29/11 by DrDeveshwar showed BUN=33, Creat=1.47, assoc w/ resolved edema therefore decr Lasix to 40mg - 1to2 Qam...    DJD> followed by Rober Minion on Vicodin, Allopurinol100 (Uric=5.9), & Colcrys0.6mg  one daily...  ~  October 15, 2011:  36mo ROV & [name] continues to lose some wt- down 7# further to 133# today, appetite is ok, eating fair & encouraged to add supplements like Ensure vs Boost etc at least Bid;  Meds reviewed & unchanged;  She had f/u DrDeveshwar 12/12 for Rheum- OA, Gout, bursitis, renal insuffic makes avoiding NSAIDs imperitive & rec to try Aspercreme...    Her breathing is stable w/o interval resp exac...    BP is stable on Diltiazem & Lasix, but reads 160/76 today (hasn't taken meds yet); we reviewed diet, no salt, etc...    FLP controlled on Simva20 but she hasn't remembered to come fasting for recheck FLP; continue diet, exercise & the same med for now...    She is c/o urinary symptoms and we discussed trial of ENABLEX 7.5mg  to see if this helps; mild renal insuffic is improved on ntodays labs w/ Creat=1.3  ~  May 22,2013:  36mo ROV & [name] is feeling well, no new complaints or concerns;  Her main prob= arthritis esp hands & bilat hips- she is followed by DrDeveshwar & seen recently w/ shot in painful right hip; prev had ESI in back from DrYates; prev Uric= 6.0 on Allopurinol, Colchicine, Vicodin... We reviewed her prob list, meds, xrays and labs> see below>> LABS 5/13:  FLP- at goals w/ good HDL on Simva20;  Chems- wnl;  CBC- ok w/ Hg=12.4.[name].  ~  June 17, 2012:  36mo ROV & she remains stable> had some vertigo- improved w/ Antivert prn; she is c/o some incr pain in buttock, right hip & down right leg; hx prev ESI from DrYates in past- she has Vicodin for prn use & it helps some; she wants to wait for f/u appt drDeveshwar before deciding what to do...  Notes that her breathing is OK "I'm on the go all the  time";  BP= 140/60 & denies CP, palpit, ch in SOB or edema...     We reviewed prob list, meds, xrays and labs> see below for updates >> OK Flu shot today...           Problem List:  GLAUCOMA (ICD-365.9) - hx mult eye problems w/ prev corneal transplant & glaucoma laser eye surg... on eye drops per  Ophthalmology at [facility]...  ASTHMATIC BRONCHITIS, ACUTE (ICD-466.0) - Hx AB requiring Rx w/ Depo/ Pred/ Mucinex/ Tussionex... ~  CXR 4/11 showed vague nodular densities on left... ~  8/11:  she went to an [facility] w/ cough, sputum, & dx w/ pneumonia rx w/ ZPak... ~  CXR 3/12 showed borderline cardiomeg, nodular opac left base, NAD... ~  Spring 2013: she notes some allergy symptoms...  HYPERTENSION (ICD-401.9) - controlled on ASA 81mg /d,  CARTIAXT 180mg /d, LASIX 40mg -  1-2Qam... ~  7/10:  labs showed BUN=54, Creat=2.0 & rec to decr diuretics in half= one Demedex & 1/2 Aldactone. ~  11/10:  labs showed BUN=38, Creat=1.8 & rec to keep same meds. ~  8/11:  labs showed BUN=42, Creat=2.1, K=4.3 ~  1/12:  labs in ER showed BUN=56, Creat=2.67, therefore diuretics decr to Demadex20/d only. ~  3/12:  2DEcho showed mod LVH, norm sys function w/ EF=60-65% & no regional wall motion abn; Gr1DD, mibile density on AoV- prob lambl'e escrescence, mildMR. ~  5/12:  f/u labs in office showed BUN=45, Creat=1.8, on Lasix 80mg  Qam for her edema... ~  9/12:  Labs by DrDeveshwar 9/12 showed BUN=33, Creat=1.47, edema resolved therefore decr Lasix to 40mg /d. ~  1/13:  BP= 160/76 but hasn't taken meds today; labs improved w/ BUN=26, Creat=1.3, BNP=50 ~  5/13:  BP= 140/78 & denies CP, palpit, SOB, edema; labs improved w/ BUN=28, Creat=1.2 ~  9/13:  BP= 140/60 & she denies CP, palpt, ch in SOB or edema...  PALPITATIONS, HX OF (ICD-V12.50)  VENOUS INSUFFICIENCY (ICD-459.81) - swelling controlled w/ diuretics, low sodium diet, elevation, support hose, etc... ~  She saw DrEarly [facility]VS- there was nothing he could do... ~   Swelling diminished w/ low sodium, elevation, support hose, & Lasix40 1-2tabs daily...  HYPERCHOLESTEROLEMIA (ICD-272.0) - prev Rx'd by DrGegick... Now on ZOCOR 20mg /d (prev Crestor5 & Lescol from [facility]) ~  she brought labs from [facility] 5/10 (off med due to $$$) w/ TChol 285, TG 204, HDL 69, LDL 195... "he was mad at me" ~  FLP 5/11 from [facility] showed TChol 202, TG 60, HDL 112, LDL 99 ~  She is reminded (again) to come FASTING for f/u FLP... ~  FLP 5/13 on Simva20 showed TChol 191, TG 81, HDL 82, LDL 93  HYPOTHYROIDISM (ICD-244.9) - prev followed by DrGegick on SYNTHROID - 1/2 daily... ~  pt brought labs from [facility] 5/10 w/ TSH= 2.90, FreeT4= 1.09 ~  labs 8/11 showed TSH= 1.87 ~  Labs 3/12 showed TSH= 2.81 ~  Labs 1/13 showed TSH= 1.65  GERD (ICD-530.81) - uses PRILOSEC 20mg /d... last EGD was 6/06 by DrPatterson showing 3cmHH, reflux...   DIVERTICULOSIS OF COLON (ICD-562.10) & COLONIC POLYPS (ICD-211.3) - last colonoscopy 6/06 was WNL- no abnormality seen...  RENAL INSUFFICIENCY (ICD-588.9) - tough balance betw renal perfusion & diuresis for edema... ~  labs 10/08 showed BUN= 27, Creat= 1.5 ~  labs 8/09 showed BUN= 44, Creat= 1.9 ~  labs 2/10 showed BUN 46, Creat= 1.7 ~  labs 7/10 showed BUN= 54, Creat= 2.0 ~  labs 11/10 showed BUN= 38, Creat= 1.8 ~  labs 8/11 showed BUN= 42, Creat= 2.1 ~  labs in ER 1/12 showed BUN=56, Creat=2.67, rec decr diuretics to Demadex20mg /d only. ~  Labs 3/12 showed improved renal function w/ BUN=31, Creat=1.4... ~  Labs 5/12 on Lasix80 showed BUN=45, Creat=1.8 ~  Labs 9/12 by DrDeveshwar showed BUN=33, Creat=1.47 ~  Labs 1/13 showed BUN=26, Creat=1.3, BNP=50... On Lasix 40mg  Qam. ~  Labs 5/13 showed BUN=28, Creat=1.2  DEGENERATIVE JOINT DISEASE (ICD-715.90) - this is her chief complaint- s/p right TKR in 1999,  left TKR 3/10... on MOBIC 7.5mg  Prn & VICODIN Prn as well... followed by DrYates; and had right second toe amp by Volney Presser 2010 for hammertoe +  spurs... also had epidural steroid shot from [facility] for LBP (without benefit she says)... ~  5/13:  She saw DrDeveshwar for Rheum w/ shot in hip & sl improved...  VITAMIN D DEFICIENCY (ICD-268.9) ~  labs 8/09 showed Vit D level = 12... rec- start Vit D 50,000 u weekly... ~  labs 2/10 showed Vit D level = 30... rec- continue Vit D 50K weekly... ~  labs 7/10 showed Vit D level = 39... rec- change to 1000 u OTC daily. ~  labs 8/11 showed Vit D level = 38... continue same.  ANXIETY (ICD-300.00) - on ALPRAZOLAM 0.25mg Tid Prn... several deaths in the family... daughter who lives w/ her recently ill...  SHINGLES - she had right T9-10 shingles in 2011 & resolved w/ Valtrex, Pred, etc...   Past Surgical History  Procedure Date  . Cataract extraction   . Abdominal hysterectomy   . Breast biopsy     Benign  . Total knee arthroplasty 1999    right  . Total knee arthroplasty 11/2008    left - Dr Ophelia Charter  . Toe amputation 08/2009    Right second toe - Dr Lestine Box    Outpatient Encounter Prescriptions as of 06/17/2012  Medication Sig Dispense Refill  . allopurinol (ZYLOPRIM) 100 MG tablet Take 100 mg by mouth daily.        [name] ALPRAZolam (XANAX) 0.25 MG tablet Take 1/2 to 1 tablet By mouth three times a day as needed for nerves  90 tablet  5  . Ascorbic Acid (VITAMIN C) 500 MG tablet Take 500 mg by mouth daily.        [name] aspirin 81 MG tablet Take 81 mg by mouth daily.        . Cholecalciferol (VITAMIN D) 1000 UNITS capsule Take 1,000 Units by mouth daily.        . colchicine 0.6 MG tablet Take 0.6 mg by mouth daily.        [name] diltiazem (CARDIZEM CD) 180 MG 24 hr capsule TAKE 1 CAPSULE DAILY  30 capsule  5  . fluorometholone (FML) 0.1 % ophthalmic suspension One drop in right eye at bedtime       . furosemide (LASIX) 40 MG tablet Take 1-2 tablets by mouth daily  60 tablet  11  . guaiFENesin (MUCINEX) 600 MG 12 hr tablet Take 1,200 mg by mouth 2 (two) times daily as needed.       [name]  HYDROcodone-acetaminophen (VICODIN) 5-500 MG per tablet Take 1/2 to 1 By mouth three times a day as needed for pain  90 tablet  5  . levothyroxine (SYNTHROID, LEVOTHROID) 125 MCG tablet Take 1/2 tablet By mouth once daily  90 tablet  3  . meclizine (ANTIVERT) 25 MG tablet Take 1 tablet (25 mg total) by mouth every 4 (four) hours as needed for dizziness.  30 tablet  4  . omeprazole (PRILOSEC) 20 MG capsule Take 20 mg by mouth daily.        Bertram Gala Glycol-Propyl Glycol (SYSTANE) 0.4-0.3 % SOLN One drop in each eye two times a day       . simvastatin (ZOCOR) 20 MG tablet Take 20 mg by mouth every evening.      [name]  timolol (TIMOPTIC) 0.5 % ophthalmic solution Place 1 drop into both eyes 2 (two) times daily.        [name] DISCONTD: ferrous sulfate 325 (65 FE) MG tablet Take 325 mg by mouth daily with breakfast.      . DISCONTD: SYNTHROID 125 MCG tablet TAKE 1/2 TABLET BY MOUTH ONCE DAILY  90 tablet  2    Allergies  Allergen Reactions  . Enablex (Darifenacin Hydrobromide Er)     Caused her throat and mouth to have bumps and feel like it was swelling  . Clarithromycin     REACTION: causes her mouth to swell  . Codeine     Makes pt nervous  . Morphine     REACTION: n/v  . Sulfonamide Derivatives     REACTION: unsure of reaction    Current Medications, Allergies, Past Medical History, Past Surgical History, Family History, and Social History were reviewed in [employer] record.    Review of Systems        See HPI - all other systems neg except as noted...  The patient complains of dyspnea on exertion, peripheral edema, muscle weakness, and difficulty walking.  The patient denies anorexia, fever, weight loss, weight gain, vision loss, decreased hearing, hoarseness, chest pain, syncope, prolonged cough, headaches, hemoptysis, abdominal pain, melena, hematochezia, severe indigestion/heartburn, hematuria, incontinence, genital sores, suspicious skin lesions, transient blindness,  depression, unusual weight change, abnormal bleeding, enlarged lymph nodes, and angioedema.     Objective:   Physical Exam      WD, WN, 78 y/o WF in NAD... GENERAL:  Alert & oriented; pleasant & cooperative... HEENT:  [place]/AT, EOM- full, EACs-clear, TMs-wnl, NOSE-clear, THROAT-clear & wnl. NECK:  Supple w/ fairROM; no JVD; normal carotid impulses w/o bruits; no thyromegaly or nodules palpated; no lymphadenopathy. CHEST:  Clear, decr BS bilat w/o wheezing, rales, or rhonchi heard... HEART:  Regular Rhythm;  gr 1/6 SEM without rubs or gallops detected... ABDOMEN:  Soft & nontender; normal bowel sounds; no organomegaly or masses detected. EXT:  moderate arthritic changes; s/p bilat TKRs; mild varicose veins/ +venous insuffic/ 2+edema R>L Right leg sl red/ inflammed, s/p right second toe distal amputation... NEURO:  CN's intact;  no focal neuro deficits... DERM:  No lesions noted; no rash etc...  RADIOLOGY DATA:  Reviewed in the EPIC EMR & discussed w/ the patient...    >>Last CXR 3/12 showed scat granulomas, ?left base nodule, right side clear, borderline cardiomegaly...  LABORATORY DATA:  Reviewed in the EPIC EMR & discussed w/ the patient...   Assessment & Plan:    HBP>  Controlled on meds, continue same including the Lasix to 40mg  Qam...  Cardiac>  DrRoss' note reviewed; see 2DEcho, Event Monitor reports no dangerous arrhythmias, continue meds...  Ven Insuffic/ EDEMA- improved>  evals by Cards & VVS> "there is nothing they can do"; continue elevation, no salt, Lasix, compression...  CHOL>  On Simva20 & FLP 5/13 looked good...  HYPOTHYROID>  Continue Synthroid 117mcg/d taking 1/2 tab per DrGegick...  GI>  Stable & followed by DrPatterson...  Renal Insuffic>  Careful w/ diuresis, NSAIDs etc; Creat improved to 1.3 on lower dose of Lasix.  DJD>  She saw DrDeveshwar for her Rheum complaints==> shot in knee; DrYates/ Alvester Morin have signed off; may need pain clinic....  Other problems  as noted...   Patient's Medications  New Prescriptions   No medications on file  Previous Medications   ALLOPURINOL (ZYLOPRIM) 100 MG TABLET    Take 100 mg by  mouth daily.     ALPRAZOLAM (XANAX) 0.25 MG TABLET    Take 1/2 to 1 tablet By mouth three times a day as needed for nerves   ASCORBIC ACID (VITAMIN C) 500 MG TABLET    Take 500 mg by mouth daily.     ASPIRIN 81 MG TABLET    Take 81 mg by mouth daily.     CHOLECALCIFEROL (VITAMIN D) 1000 UNITS CAPSULE    Take 1,000 Units by mouth daily.     COLCHICINE 0.6 MG TABLET    Take 0.6 mg by mouth daily.     DILTIAZEM (CARDIZEM CD) 180 MG 24 HR CAPSULE    TAKE 1 CAPSULE DAILY   FLUOROMETHOLONE (FML) 0.1 % OPHTHALMIC SUSPENSION    One drop in right eye at bedtime    FUROSEMIDE (LASIX) 40 MG TABLET    Take 1-2 tablets by mouth daily   GUAIFENESIN (MUCINEX) 600 MG 12 HR TABLET    Take 1,200 mg by mouth 2 (two) times daily as needed.    HYDROCODONE-ACETAMINOPHEN (VICODIN) 5-500 MG PER TABLET    Take 1/2 to 1 By mouth three times a day as needed for pain   LEVOTHYROXINE (SYNTHROID, LEVOTHROID) 125 MCG TABLET    Take 1/2 tablet By mouth once daily   MECLIZINE (ANTIVERT) 25 MG TABLET    Take 1 tablet (25 mg total) by mouth every 4 (four) hours as needed for dizziness.   OMEPRAZOLE (PRILOSEC) 20 MG CAPSULE    Take 20 mg by mouth daily.     POLYETHYL GLYCOL-PROPYL GLYCOL (SYSTANE) 0.4-0.3 % SOLN    One drop in each eye two times a day    SIMVASTATIN (ZOCOR) 20 MG TABLET    TAKE 1 TAB BY MOUTH AT BEDTIME   SIMVASTATIN (ZOCOR) 20 MG TABLET    Take 20 mg by mouth every evening.   TIMOLOL (TIMOPTIC) 0.5 % OPHTHALMIC SOLUTION    Place 1 drop into both eyes 2 (two) times daily.    Modified Medications   No medications on file  Discontinued Medications   FERROUS SULFATE 325 (65 FE) MG TABLET    Take 325 mg by mouth daily with breakfast.   SYNTHROID 125 MCG TABLET    TAKE 1/2 TABLET BY MOUTH ONCE DAILY

## 2012-06-29 DIAGNOSIS — M2040 Other hammer toe(s) (acquired), unspecified foot: Secondary | ICD-10-CM | POA: Diagnosis not present

## 2012-07-01 ENCOUNTER — Other Ambulatory Visit: Payer: Self-pay | Admitting: Pulmonary Disease

## 2012-07-06 DIAGNOSIS — IMO0002 Reserved for concepts with insufficient information to code with codable children: Secondary | ICD-10-CM | POA: Diagnosis not present

## 2012-07-06 DIAGNOSIS — M4310 Spondylolisthesis, site unspecified: Secondary | ICD-10-CM | POA: Diagnosis not present

## 2012-07-08 DIAGNOSIS — H409 Unspecified glaucoma: Secondary | ICD-10-CM | POA: Diagnosis not present

## 2012-07-08 DIAGNOSIS — Z961 Presence of intraocular lens: Secondary | ICD-10-CM | POA: Diagnosis not present

## 2012-07-08 DIAGNOSIS — Z947 Corneal transplant status: Secondary | ICD-10-CM | POA: Diagnosis not present

## 2012-07-08 DIAGNOSIS — H40049 Steroid responder, unspecified eye: Secondary | ICD-10-CM | POA: Diagnosis not present

## 2012-07-14 DIAGNOSIS — L851 Acquired keratosis [keratoderma] palmaris et plantaris: Secondary | ICD-10-CM | POA: Diagnosis not present

## 2012-07-14 DIAGNOSIS — M2040 Other hammer toe(s) (acquired), unspecified foot: Secondary | ICD-10-CM | POA: Diagnosis not present

## 2012-07-14 DIAGNOSIS — M25579 Pain in unspecified ankle and joints of unspecified foot: Secondary | ICD-10-CM | POA: Diagnosis not present

## 2012-07-14 DIAGNOSIS — M205X9 Other deformities of toe(s) (acquired), unspecified foot: Secondary | ICD-10-CM | POA: Diagnosis not present

## 2012-07-14 DIAGNOSIS — E7800 Pure hypercholesterolemia, unspecified: Secondary | ICD-10-CM | POA: Diagnosis not present

## 2012-07-20 DIAGNOSIS — M2040 Other hammer toe(s) (acquired), unspecified foot: Secondary | ICD-10-CM | POA: Diagnosis not present

## 2012-07-22 DIAGNOSIS — Z79899 Other long term (current) drug therapy: Secondary | ICD-10-CM | POA: Diagnosis not present

## 2012-07-27 DIAGNOSIS — M545 Low back pain: Secondary | ICD-10-CM | POA: Diagnosis not present

## 2012-07-27 DIAGNOSIS — M109 Gout, unspecified: Secondary | ICD-10-CM | POA: Diagnosis not present

## 2012-07-27 DIAGNOSIS — Z79899 Other long term (current) drug therapy: Secondary | ICD-10-CM | POA: Diagnosis not present

## 2012-07-27 DIAGNOSIS — IMO0002 Reserved for concepts with insufficient information to code with codable children: Secondary | ICD-10-CM | POA: Diagnosis not present

## 2012-08-04 ENCOUNTER — Telehealth: Payer: Self-pay | Admitting: Pulmonary Disease

## 2012-08-04 NOTE — Telephone Encounter (Signed)
Per Lennon Alstrom, pt can be worked in with TP tomorrow at 10 am.    ----  Jeanene Erb, spoke with pt.  She is fine with coming in tomorrow, Nov 13 at 10 am to see TP.  OV scheduled -- pt aware.

## 2012-08-04 NOTE — Telephone Encounter (Signed)
Called, spoke with pt.  States on Saturday her legs started swelling, they are sore to touch, warm to touch, and have red "splotches" on them.  Symptoms are worse in left leg. Denies f/c/s, insect bites, drainage, or pain in calf when walking.  Has kept legs elevated and tried to stay off of them as much as possible.  Taking lasix 40 mg qd.  Has a hx of cellulitis -- states this looks like it cellulitis again.  Requesting recs -- pls advise if pt can be worked in or any further recs.  Thank you.   ** Note:  Pt states she was seen by Dr. Corliss Skains last week and uric acid was elevated.

## 2012-08-05 ENCOUNTER — Encounter: Payer: Self-pay | Admitting: Adult Health

## 2012-08-05 ENCOUNTER — Ambulatory Visit (INDEPENDENT_AMBULATORY_CARE_PROVIDER_SITE_OTHER): Payer: Medicare Other | Admitting: Adult Health

## 2012-08-05 VITALS — BP 142/66 | HR 75 | Temp 97.1°F | Ht 63.0 in | Wt 142.3 lb

## 2012-08-05 DIAGNOSIS — L0291 Cutaneous abscess, unspecified: Secondary | ICD-10-CM | POA: Diagnosis not present

## 2012-08-05 DIAGNOSIS — I872 Venous insufficiency (chronic) (peripheral): Secondary | ICD-10-CM

## 2012-08-05 DIAGNOSIS — L0390 Cellulitis, unspecified: Secondary | ICD-10-CM | POA: Diagnosis not present

## 2012-08-05 DIAGNOSIS — M949 Disorder of cartilage, unspecified: Secondary | ICD-10-CM | POA: Diagnosis not present

## 2012-08-05 DIAGNOSIS — M899 Disorder of bone, unspecified: Secondary | ICD-10-CM | POA: Diagnosis not present

## 2012-08-05 MED ORDER — DOXYCYCLINE HYCLATE 100 MG PO TABS: 100.0000 mg | ORAL_TABLET | Freq: Two times a day (BID) | ORAL | Status: AC

## 2012-08-05 NOTE — Patient Instructions (Addendum)
Take extra Lasix for 3 days then back to 1 tab daily  Low salt diet  Keep legs elevated.  We are setting you up for venous doppler of left lower leg.  Doxycycline 100mg  Twice daily  For 7 days  Please contact office for sooner follow up if symptoms do not improve or worsen or seek emergency care  follow up 2 weeks and  As needed

## 2012-08-06 DIAGNOSIS — L0390 Cellulitis, unspecified: Secondary | ICD-10-CM | POA: Insufficient documentation

## 2012-08-06 NOTE — Assessment & Plan Note (Signed)
Venous Insufficiency complicated by suspected cellulitis  L>R edema . She has a hx of chronic LE edema.  Recent foot surgery w/ increased immobility - will set up for venous dopplers Has had dopplers in past that were neg.  Will tx w/ mild increased diuresis and abx.   Plan Take extra Lasix for 3 days then back to 1 tab daily  Low salt diet  Keep legs elevated.  We are setting you up for venous doppler of left lower leg.  Doxycycline 100mg  Twice daily  For 7 days  Please contact office for sooner follow up if symptoms do not improve or worsen or seek emergency care  follow up 2 weeks and  As needed

## 2012-08-06 NOTE — Progress Notes (Signed)
Subjective:    Patient ID: [name], female    DOB: 12/17/1914, 78 y.o.   MRN: [id number]  HPI 78 y/o WF w/ mult med problems including:  Hx asthmatic bronchitis;  HBP;  Ven Insuffic & edema;  Hyperchol;  Hypothyroid;  GERD/ Divertics/ Colon polyps;  Renal insuffic;  DJD;  Vit D defic;  Anxiety...   ~  December 19, 2010:  2 month ROV & add-on for incr swelling in the legs right>left;  Known venous insuffic & supposedly on low sodium diet, elevation, support hose, & Lasix  20mg - 2am & 1pm currently;  She had neg venous dopplers w/o DVT etc;  f/u labs today w/ BUN=31, Creat=1.4 (improved) & we decided to try incr to 40mg Bid & refer to VVS for their opinion;  We will check 2DEchocardiogram as well ==> modLVH, normLVF w/ EF=60-65% & norm wall motion, Gr 1 DD, abn AoV w/ ?Lambl's excrescence ==> sent to Cards for review.    AB>  On Mucinex 1-2 Bid w/ fluids;  Exacerbations treated w/ antibiotics, prednisone, tussionex when needed;  CXR today showed NAD (no edema), prob granuloma, incr markings, & ?LLL nodular opacity will be followed up...    HBP>  Controlled on Diltiazem, Lasix, & BP= 132/66;  Denies CP, palpit, dizzy, ch in SOB;  +edema r>L as noted above...    Chol>  On Zocor 20mg /d from [employer];  He has followed her FLPs but is retiring & she has requested that we follow up her labs in the future...    Hypothyroid>  On Synthroid 125mg - 1/2 tab daily;  Labs today showed TSH= 2.81, stable, continue same...    Renal insuffic>  Renal func improved w/ stopping demadex/ aldactone (changed to Lasix now at 60mg /d w/ BUN 31, Creat 1.4);  We decided to incr to 40mg  Bid due to her edema & we will watch the renal function (note: BNP= 71)...  ~  Jan 30, 2011:  6wk ROV & she was seen by Nunzio Cory for Cards 4/12> she did some blood work w/ Creat=1.8, Low titer ANA, Sed=53, & Event Recorder pending ("there was nothing life threatening, nothing to worry about" she says)...  She also saw DrEarly 4/12 for VVS>  VenDuplex confirmed reflux in deep sys on right & ablation of right GSV wouldn't help- she reports that there is nothing that they can do & he rec continued Lasix, elevation, no salt, compression hose, & f/u prn...  She complains of legs itching & we discussed a topical hydrocort cream + Atarax as needed...   Her CC today is pain in hips & legs making it hard for her to rest at night> she was prev eval by DrYates- he sent her for shots by DrNewton (no benefit she says) so he wanted her to return to DrYates but he's said there is nothing he can do> they did not offer review by Rheumatologist or Pain clinic assessment (we will proceed w/ consult from DrDeveshwar)...  ~  March 13, 2011:  6wk ROV & she is feeling some better, swelling has diminished w/ Lasix 80mg /d & dermatitis resolved w/ HC cream;  wt is down 10# to 149#;  We reviewed current med Rx & decided to continue same & f/u in several monthe w/ f/u labs...    She also saw DrDeveshwar last week> note pending, pt says they XRayed her hand & did blood work, called for records from [place], and has f/u 04/03/11...  ~  June 13, 2011:  52mo ROV &  she has lost another 9# down to 140# & edema resolved; still on Lasix 80mg  Qam & she saw DrDeveshwar 05/29/11 w/ labs showing BUN=33, Creat=1.47; we decided to decr the Lasix to 40mg /d & may incr to 80mg  Prn...    AB> stable, no recent exac, no regular meds required & she uses the Mucinex as needed...    HBP> controlled on Diltiazem180 & Furosemide 40mg -2Qam w/ BP=148/64 today & she denies CP, palpit, dizzy, SOB, or edema at present...    VI, Edema> she knows to elim sodium, elevate, wear support hose; she has been seen by VVS, DrEarly & there is nothing they can do; edema decr w/ Lasix 80mg /d...    CHOL> prev followed by DrGegick; on Simva20 Qhs + low fat diet; we discussed FASTING blood work on ret...    Hypothy> stable on Synthroid - 1/2 tab daily; labs 3/12 showed TSH= 2.81     Renal Insuffic> Creat had  increased to 1.8 on the Lasix 80mg ; repeat labs 05/29/11 by DrDeveshwar showed BUN=33, Creat=1.47, assoc w/ resolved edema therefore decr Lasix to 40mg - 1to2 Qam...    DJD> followed by Rober Minion on Vicodin, Allopurinol100 (Uric=5.9), & Colcrys0.6mg  one daily...  ~  October 15, 2011:  78mo ROV & [name] continues to lose some wt- down 7# further to 133# today, appetite is ok, eating fair & encouraged to add supplements like Ensure vs Boost etc at least Bid;  Meds reviewed & unchanged;  She had f/u DrDeveshwar 12/12 for Rheum- OA, Gout, bursitis, renal insuffic makes avoiding NSAIDs imperitive & rec to try Aspercreme...    Her breathing is stable w/o interval resp exac...    BP is stable on Diltiazem & Lasix, but reads 160/76 today (hasn't taken meds yet); we reviewed diet, no salt, etc...    FLP controlled on Simva20 but she hasn't remembered to come fasting for recheck FLP; continue diet, exercise & the same med for now...    She is c/o urinary symptoms and we discussed trial of ENABLEX 7.5mg  to see if this helps; mild renal insuffic is improved on ntodays labs w/ Creat=1.3  ~  May 22,2013:  78mo ROV & [name] is feeling well, no new complaints or concerns;  Her main prob= arthritis esp hands & bilat hips- she is followed by DrDeveshwar & seen recently w/ shot in painful right hip; prev had ESI in back from DrYates; prev Uric= 6.0 on Allopurinol, Colchicine, Vicodin... We reviewed her prob list, meds, xrays and labs> see below>> LABS 5/13:  FLP- at goals w/ good HDL on Simva20;  Chems- wnl;  CBC- ok w/ Hg=12.4.[name].  ~  June 17, 2012:  78mo ROV & she remains stable> had some vertigo- improved w/ Antivert prn; she is c/o some incr pain in buttock, right hip & down right leg; hx prev ESI from DrYates in past- she has Vicodin for prn use & it helps some; she wants to wait for f/u appt drDeveshwar before deciding what to do...  Notes that her breathing is OK "I'm on the go all the time";  BP= 140/60 &  denies CP, palpit, ch in SOB or edema...     We reviewed prob list, meds, xrays and labs> see below for updates >> OK Flu shot today...  08/05/12 Acute OV  Complains of bilateral LE edema, redness, warm to touch, tenerness behind left ankle x 5 days. Had recent foot surgery w/ decreased mobility.  No fever, drainage, chest pain , orthopnea.  Currently on Lasix  40mg  daily , increased 2 days ago to 80mg  daily not much help.  No known injury . No calf pain.          Problem List:  GLAUCOMA (ICD-365.9) - hx mult eye problems w/ prev corneal transplant & glaucoma laser eye surg... on eye drops per Ophthalmology at [facility]...  ASTHMATIC BRONCHITIS, ACUTE (ICD-466.0) - Hx AB requiring Rx w/ Depo/ Pred/ Mucinex/ Tussionex... ~  CXR 4/11 showed vague nodular densities on left... ~  8/11:  she went to an [facility] w/ cough, sputum, & dx w/ pneumonia rx w/ ZPak... ~  CXR 3/12 showed borderline cardiomeg, nodular opac left base, NAD... ~  Spring 2013: she notes some allergy symptoms...  HYPERTENSION (ICD-401.9) - controlled on ASA 81mg /d,  CARTIAXT 180mg /d, LASIX 40mg -  1-2Qam... ~  7/10:  labs showed BUN=54, Creat=2.0 & rec to decr diuretics in half= one Demedex & 1/2 Aldactone. ~  11/10:  labs showed BUN=38, Creat=1.8 & rec to keep same meds. ~  8/11:  labs showed BUN=42, Creat=2.1, K=4.3 ~  1/12:  labs in ER showed BUN=56, Creat=2.67, therefore diuretics decr to Demadex20/d only. ~  3/12:  2DEcho showed mod LVH, norm sys function w/ EF=60-65% & no regional wall motion abn; Gr1DD, mibile density on AoV- prob lambl'e escrescence, mildMR. ~  5/12:  f/u labs in office showed BUN=45, Creat=1.8, on Lasix 80mg  Qam for her edema... ~  9/12:  Labs by DrDeveshwar 9/12 showed BUN=33, Creat=1.47, edema resolved therefore decr Lasix to 40mg /d. ~  1/13:  BP= 160/76 but hasn't taken meds today; labs improved w/ BUN=26, Creat=1.3, BNP=50 ~  5/13:  BP= 140/78 & denies CP, palpit, SOB, edema; labs improved w/  BUN=28, Creat=1.2 ~  9/13:  BP= 140/60 & she denies CP, palpt, ch in SOB or edema...  PALPITATIONS, HX OF (ICD-V12.50)  VENOUS INSUFFICIENCY (ICD-459.81) - swelling controlled w/ diuretics, low sodium diet, elevation, support hose, etc... ~  She saw DrEarly VVS- there was nothing he could do... ~  Swelling diminished w/ low sodium, elevation, support hose, & Lasix40 1-2tabs daily...  HYPERCHOLESTEROLEMIA (ICD-272.0) - prev Rx'd by DrGegick... Now on ZOCOR 20mg /d (prev Crestor5 & Lescol from [facility]) ~  she brought labs from [facility] 5/10 (off med due to $$$) w/ TChol 285, TG 204, HDL 69, LDL 195... "he was mad at me" ~  FLP 5/11 from [facility] showed TChol 202, TG 60, HDL 112, LDL 99 ~  She is reminded (again) to come FASTING for f/u FLP... ~  FLP 5/13 on Simva20 showed TChol 191, TG 81, HDL 82, LDL 93  HYPOTHYROIDISM (ICD-244.9) - prev followed by DrGegick on SYNTHROID - 1/2 daily... ~  pt brought labs from [facility] 5/10 w/ TSH= 2.90, FreeT4= 1.09 ~  labs 8/11 showed TSH= 1.87 ~  Labs 3/12 showed TSH= 2.81 ~  Labs 1/13 showed TSH= 1.65  GERD (ICD-530.81) - uses PRILOSEC 20mg /d... last EGD was 6/06 by DrPatterson showing 3cmHH, reflux...   DIVERTICULOSIS OF COLON (ICD-562.10) & COLONIC POLYPS (ICD-211.3) - last colonoscopy 6/06 was WNL- no abnormality seen...  RENAL INSUFFICIENCY (ICD-588.9) - tough balance betw renal perfusion & diuresis for edema... ~  labs 10/08 showed BUN= 27, Creat= 1.5 ~  labs 8/09 showed BUN= 44, Creat= 1.9 ~  labs 2/10 showed BUN 46, Creat= 1.7 ~  labs 7/10 showed BUN= 54, Creat= 2.0 ~  labs 11/10 showed BUN= 38, Creat= 1.8 ~  labs 8/11 showed BUN= 42, Creat= 2.1 ~  labs in  ER 1/12 showed BUN=56, Creat=2.67, rec decr diuretics to Demadex20mg /d only. ~  Labs 3/12 showed improved renal function w/ BUN=31, Creat=1.4... ~  Labs 5/12 on Lasix80 showed BUN=45, Creat=1.8 ~  Labs 9/12 by DrDeveshwar showed BUN=33, Creat=1.47 ~  Labs 1/13 showed BUN=26, Creat=1.3,  BNP=50... On Lasix 40mg  Qam. ~  Labs 5/13 showed BUN=28, Creat=1.2  DEGENERATIVE JOINT DISEASE (ICD-715.90) - this is her chief complaint- s/p right TKR in 1999,  left TKR 3/10... on MOBIC 7.5mg  Prn & VICODIN Prn as well... followed by DrYates; and had right second toe amp by Volney Presser 2010 for hammertoe + spurs... also had epidural steroid shot from [facility] for LBP (without benefit she says)... ~  5/13:  She saw DrDeveshwar for Rheum w/ shot in hip & sl improved...  VITAMIN D DEFICIENCY (ICD-268.9) ~  labs 8/09 showed Vit D level = 12... rec- start Vit D 50,000 u weekly... ~  labs 2/10 showed Vit D level = 30... rec- continue Vit D 50K weekly... ~  labs 7/10 showed Vit D level = 39... rec- change to 1000 u OTC daily. ~  labs 8/11 showed Vit D level = 38... continue same.  ANXIETY (ICD-300.00) - on ALPRAZOLAM 0.25mg Tid Prn... several deaths in the family... daughter who lives w/ her recently ill...  SHINGLES - she had right T9-10 shingles in 2011 & resolved w/ Valtrex, Pred, etc...   Past Surgical History  Procedure Date  . Cataract extraction   . Abdominal hysterectomy   . Breast biopsy     Benign  . Total knee arthroplasty 1999    right  . Total knee arthroplasty 11/2008    left - Dr Ophelia Charter  . Toe amputation 08/2009    Right second toe - Dr Lestine Box    Outpatient Encounter Prescriptions as of 08/05/2012  Medication Sig Dispense Refill  . acetaminophen (TYLENOL) 325 MG tablet Per bottle as needed      . allopurinol (ZYLOPRIM) 100 MG tablet Take 100 mg by mouth daily.        [name] ALPRAZolam (XANAX) 0.25 MG tablet Take 1/2 to 1 tablet By mouth three times a day as needed for nerves  90 tablet  5  . Ascorbic Acid (VITAMIN C) 500 MG tablet Take 500 mg by mouth daily.        [name] aspirin 81 MG tablet Take 81 mg by mouth daily.        . Cholecalciferol (VITAMIN D) 1000 UNITS capsule Take 1,000 Units by mouth daily.        . colchicine 0.6 MG tablet Take 0.6 mg by mouth daily.        [name]  diltiazem (CARDIZEM CD) 180 MG 24 hr capsule TAKE 1 CAPSULE DAILY  30 capsule  5  . fluorometholone (FML) 0.1 % ophthalmic suspension One drop in right eye at bedtime       . furosemide (LASIX) 40 MG tablet Take 1-2 tablets by mouth daily  60 tablet  11  . guaiFENesin (MUCINEX) 600 MG 12 hr tablet Take 1,200 mg by mouth 2 (two) times daily as needed.       [name] levothyroxine (SYNTHROID, LEVOTHROID) 125 MCG tablet Take 1/2 tablet By mouth once daily  90 tablet  3  . meclizine (ANTIVERT) 25 MG tablet Take 1 tablet (25 mg total) by mouth every 4 (four) hours as needed for dizziness.  30 tablet  4  . omeprazole (PRILOSEC) 20 MG capsule Take 20 mg by mouth daily.        [name]  Polyethyl Glycol-Propyl Glycol (SYSTANE) 0.4-0.3 % SOLN One drop in each eye two times a day       . simvastatin (ZOCOR) 20 MG tablet TAKE 1 TAB BY MOUTH AT BEDTIME  90 tablet  3  . timolol (TIMOPTIC) 0.5 % ophthalmic solution Place 1 drop into both eyes 2 (two) times daily.        . [DISCONTINUED] simvastatin (ZOCOR) 20 MG tablet Take 20 mg by mouth every evening.      [name] doxycycline (VIBRA-TABS) 100 MG tablet Take 1 tablet (100 mg total) by mouth 2 (two) times daily.  14 tablet  0  . HYDROcodone-acetaminophen (VICODIN) 5-500 MG per tablet Take 1/2 to 1 By mouth three times a day as needed for pain  90 tablet  5    Allergies  Allergen Reactions  . Enablex (Darifenacin Hydrobromide Er)     Caused her throat and mouth to have bumps and feel like it was swelling  . Clarithromycin     REACTION: causes her mouth to swell  . Codeine     Makes pt nervous  . Morphine     REACTION: n/v  . Sulfonamide Derivatives     REACTION: unsure of reaction    Current Medications, Allergies, Past Medical History, Past Surgical History, Family History, and Social History were reviewed in [employer] record.    Review of Systems        Constitutional:   No  weight loss, night sweats,  Fevers, chills,  +fatigue, or   lassitude.  HEENT:   No headaches,  Difficulty swallowing,  Tooth/dental problems, or  Sore throat,                No sneezing, itching, ear ache, nasal congestion, post nasal drip,   CV:  No chest pain,  Orthopnea, PND,  anasarca, dizziness, palpitations, syncope.   GI  No heartburn, indigestion, abdominal pain, nausea, vomiting, diarrhea, change in bowel habits, loss of appetite, bloody stools.   Resp:   No excess mucus, no productive cough,  No non-productive cough,  No coughing up of blood.  No change in color of mucus.  No wheezing.  No chest wall deformity  Skin: no rash or lesions.  GU: no dysuria, change in color of urine, no urgency or frequency.  No flank pain, no hematuria   MS:  No joint pain or swelling.  No decreased range of motion.     Psych:  No change in mood or affect. No depression or anxiety.  No memory loss.       Objective:   Physical Exam      WD, WN, 78 y/o WF in NAD... GENERAL:  Alert & oriented; pleasant & cooperative... HEENT:  [place]/AT, EACs-clear, TMs-wnl, NOSE-clear, THROAT-clear & wnl. NECK:  Supple w/ fairROM; no JVD; normal carotid impulses w/o bruits; no thyromegaly or nodules palpated; no lymphadenopathy. CHEST:  Clear, decr BS bilat w/o wheezing, rales, or rhonchi heard... HEART:  Regular Rhythm;  gr 1/6 SEM without rubs or gallops detected... ABDOMEN:  Soft & nontender; normal bowel sounds; no organomegaly or masses detected. EXT:  moderate arthritic changes; s/p bilat TKRs; mild varicose veins/ +venous insuffic/ 2+edema L>:R sl red/ inflammed ,skin intact w/ no ulceration noted. Stasis dermatatic changes bilaterally.  Neg homans sign.  NEURO:     no focal neuro deficits... DERM: red along left lower ext. .     Assessment & Plan:

## 2012-08-07 ENCOUNTER — Encounter (INDEPENDENT_AMBULATORY_CARE_PROVIDER_SITE_OTHER): Payer: Medicare Other

## 2012-08-07 DIAGNOSIS — M7989 Other specified soft tissue disorders: Secondary | ICD-10-CM

## 2012-08-07 DIAGNOSIS — I872 Venous insufficiency (chronic) (peripheral): Secondary | ICD-10-CM

## 2012-08-13 ENCOUNTER — Encounter: Payer: Self-pay | Admitting: Adult Health

## 2012-08-13 NOTE — Progress Notes (Signed)
Quick Note:  Spoke with pt and notified of results per [name] Parrett. Pt verbalized understanding and denied any questions.  ______ 

## 2012-08-17 ENCOUNTER — Telehealth: Payer: Self-pay | Admitting: Pulmonary Disease

## 2012-08-17 DIAGNOSIS — R21 Rash and other nonspecific skin eruption: Secondary | ICD-10-CM

## 2012-08-17 NOTE — Telephone Encounter (Signed)
Per SN---if its still a rash  Then we will need to refer her to dermatology for their review and recs.  thanks

## 2012-08-17 NOTE — Telephone Encounter (Signed)
I spoke with pt and she saw TP on 08/05/12 for venous insufficiency. She stated her left leg is still not any better. Her doppler was neg. She has been through 3 tubes of cortisone cream. She is keeping her leg elevated, on low salt diet, takes lasix 40 mg QD, and still has rash on leg. She is concerned this is still going on. Her leg is very tender to touch. She is requesting to see SN only. Please advise thanks  Allergies  Allergen Reactions  . Enablex (Darifenacin Hydrobromide Er)     Caused her throat and mouth to have bumps and feel like it was swelling  . Clarithromycin     REACTION: causes her mouth to swell  . Codeine     Makes pt nervous  . Morphine     REACTION: n/v  . Sulfonamide Derivatives     REACTION: unsure of reaction

## 2012-08-17 NOTE — Telephone Encounter (Signed)
Spoke with pt and notified of recs per SN She verbalized understanding Wants a referral to see Dr [place] since she has seen her in the past Order was sent to [facility]

## 2012-08-19 ENCOUNTER — Telehealth: Payer: Self-pay | Admitting: Pulmonary Disease

## 2012-08-19 ENCOUNTER — Ambulatory Visit: Payer: Medicare Other | Admitting: Adult Health

## 2012-08-19 DIAGNOSIS — I8310 Varicose veins of unspecified lower extremity with inflammation: Secondary | ICD-10-CM | POA: Diagnosis not present

## 2012-08-19 NOTE — Telephone Encounter (Signed)
Per SN----ok to increase lasix from 40 mg every morning to 80 mg po every am with an appt with SN in 1 month with labs.  thanks

## 2012-08-19 NOTE — Telephone Encounter (Signed)
Please advise where to put pt on schedule.

## 2012-08-19 NOTE — Telephone Encounter (Signed)
Spoke with patient-aware to increased Lasix and pt added on to 12-30 at 2:30pm schedule.

## 2012-08-19 NOTE — Telephone Encounter (Signed)
Closed in error.  Ok to add pt on SN schedule for 12-30 at 2:30.

## 2012-08-19 NOTE — Telephone Encounter (Signed)
11.27.13 appt w/ TP cancelled > pt to see dermatology per 11.25.13 phone note.  Called spoke with patient who reported that she did see dermatology earlier this morning.  She was given some different support hose w/ greater amount of support.  Pt stated that it questioned whether or not her lasix needs to be increased.  Dr Kriste Basque please advise thanks.  At last ov w/ TP 11.13.13: Patient Instructions       Take extra Lasix for 3 days then back to 1 tab daily  Low salt diet  Keep legs elevated.  We are setting you up for venous doppler of left lower leg.  Doxycycline 100mg  Twice daily For 7 days  Please contact office for sooner follow up if symptoms do not improve or worsen or seek emergency care  follow up 2 weeks and As needed      bmet last checked 03/2012 and BUN=34   SerCreat=1.3

## 2012-08-24 DIAGNOSIS — Z79899 Other long term (current) drug therapy: Secondary | ICD-10-CM | POA: Diagnosis not present

## 2012-08-24 DIAGNOSIS — M2550 Pain in unspecified joint: Secondary | ICD-10-CM | POA: Diagnosis not present

## 2012-08-26 ENCOUNTER — Other Ambulatory Visit: Payer: Self-pay | Admitting: Pulmonary Disease

## 2012-09-02 DIAGNOSIS — I83229 Varicose veins of left lower extremity with both ulcer of unspecified site and inflammation: Secondary | ICD-10-CM | POA: Diagnosis not present

## 2012-09-02 DIAGNOSIS — I83219 Varicose veins of right lower extremity with both ulcer of unspecified site and inflammation: Secondary | ICD-10-CM | POA: Diagnosis not present

## 2012-09-11 DIAGNOSIS — M48061 Spinal stenosis, lumbar region without neurogenic claudication: Secondary | ICD-10-CM | POA: Diagnosis not present

## 2012-09-11 DIAGNOSIS — M4310 Spondylolisthesis, site unspecified: Secondary | ICD-10-CM | POA: Diagnosis not present

## 2012-09-11 DIAGNOSIS — IMO0002 Reserved for concepts with insufficient information to code with codable children: Secondary | ICD-10-CM | POA: Diagnosis not present

## 2012-09-21 ENCOUNTER — Encounter: Payer: Self-pay | Admitting: Pulmonary Disease

## 2012-09-21 ENCOUNTER — Other Ambulatory Visit (INDEPENDENT_AMBULATORY_CARE_PROVIDER_SITE_OTHER): Payer: Medicare Other

## 2012-09-21 ENCOUNTER — Ambulatory Visit (INDEPENDENT_AMBULATORY_CARE_PROVIDER_SITE_OTHER): Payer: Medicare Other | Admitting: Pulmonary Disease

## 2012-09-21 VITALS — BP 138/62 | HR 82 | Temp 98.7°F | Ht 63.0 in | Wt 148.0 lb

## 2012-09-21 DIAGNOSIS — F411 Generalized anxiety disorder: Secondary | ICD-10-CM

## 2012-09-21 DIAGNOSIS — E7800 Pure hypercholesterolemia, unspecified: Secondary | ICD-10-CM

## 2012-09-21 DIAGNOSIS — I359 Nonrheumatic aortic valve disorder, unspecified: Secondary | ICD-10-CM

## 2012-09-21 DIAGNOSIS — R609 Edema, unspecified: Secondary | ICD-10-CM

## 2012-09-21 DIAGNOSIS — I10 Essential (primary) hypertension: Secondary | ICD-10-CM | POA: Diagnosis not present

## 2012-09-21 DIAGNOSIS — E039 Hypothyroidism, unspecified: Secondary | ICD-10-CM

## 2012-09-21 DIAGNOSIS — N259 Disorder resulting from impaired renal tubular function, unspecified: Secondary | ICD-10-CM

## 2012-09-21 DIAGNOSIS — K5730 Diverticulosis of large intestine without perforation or abscess without bleeding: Secondary | ICD-10-CM

## 2012-09-21 DIAGNOSIS — M1990 Unspecified osteoarthritis, unspecified site: Secondary | ICD-10-CM

## 2012-09-21 DIAGNOSIS — I872 Venous insufficiency (chronic) (peripheral): Secondary | ICD-10-CM | POA: Diagnosis not present

## 2012-09-21 DIAGNOSIS — K219 Gastro-esophageal reflux disease without esophagitis: Secondary | ICD-10-CM

## 2012-09-21 LAB — BASIC METABOLIC PANEL
CO2: 30 mEq/L (ref 19–32)
Chloride: 99 mEq/L (ref 96–112)
Creatinine, Ser: 1.5 mg/dL — ABNORMAL HIGH (ref 0.4–1.2)
Potassium: 4 mEq/L (ref 3.5–5.1)
Sodium: 140 mEq/L (ref 135–145)

## 2012-09-21 NOTE — Patient Instructions (Addendum)
Today we updated your med list in our EPIC system...    Continue your current medications the same...  Today we did your follow up Metabolic [panel...    We will call you w/ the results and any needed med changes...  Call us fotr any questions...  Let's plan a similar f/u visit in 3-4 months.Marland KitchenMarland Kitchen

## 2012-09-21 NOTE — Progress Notes (Signed)
Subjective:    Patient ID: [name], female    DOB: 31-Jul-1915, 78 y.o.   MRN: [id number]  HPI 78 y/o WF here for a follow up visit... she has mult med problems including:  Hx asthmatic bronchitis;  HBP;  Ven Insuffic & edema;  Hyperchol;  Hypothyroid;  GERD/ Divertics/ Colon polyps;  Renal insuffic;  DJD;  Vit D defic;  Anxiety...   ~  December 19, 2010:  2 month ROV & add-on for incr swelling in the legs right>left;  Known venous insuffic & supposedly on low sodium diet, elevation, support hose, & Lasix  20mg - 2am & 1pm currently;  She had neg venous dopplers w/o DVT etc;  f/u labs today w/ BUN=31, Creat=1.4 (improved) & we decided to try incr to 40mg Bid & refer to [facility]VS for their opinion;  We will check 2DEchocardiogram as well ==> modLVH, normLVF w/ EF=60-65% & norm wall motion, Gr 1 DD, abn AoV w/ ?Lambl's excrescence ==> sent to Cards for review.    AB>  On Mucinex 1-2 Bid w/ fluids;  Exacerbations treated w/ antibiotics, prednisone, tussionex when needed;  CXR today showed NAD (no edema), prob granuloma, incr markings, & ?LLL nodular opacity will be followed up...    HBP>  Controlled on Diltiazem, Lasix, & BP= 132/66;  Denies CP, palpit, dizzy, ch in SOB;  +edema r>L as noted above...    Chol>  On Zocor 20mg /d from [employer];  He has followed her FLPs but is retiring & she has requested that we follow up her labs in the future...    Hypothyroid>  On Synthroid 125mg - 1/2 tab daily;  Labs today showed TSH= 2.81, stable, continue same...    Renal insuffic>  Renal func improved w/ stopping demadex/ aldactone (changed to Lasix now at 60mg /d w/ BUN 31, Creat 1.4);  We decided to incr to 40mg  Bid due to her edema & we will watch the renal function (note: BNP= 71)...  ~  Jan 30, 2011:  6wk ROV & she was seen by Nunzio Cory for Cards 4/12> she did some blood work w/ Creat=1.8, Low titer ANA, Sed=53, & Event Recorder pending ("there was nothing life threatening, nothing to worry about" she says)...  She also  saw DrEarly 4/12 for [facility]> VenDuplex confirmed reflux in deep sys on right & ablation of right GSV wouldn't help- she reports that there is nothing that they can do & he rec continued Lasix, elevation, no salt, compression hose, & f/u prn...  She complains of legs itching & we discussed a topical hydrocort cream + Atarax as needed...   Her CC today is pain in hips & legs making it hard for her to rest at night> she was prev eval by DrYates- he sent her for shots by DrNewton (no benefit she says) so he wanted her to return to DrYates but he's said there is nothing he can do> they did not offer review by Rheumatologist or Pain clinic assessment (we will proceed w/ consult from DrDeveshwar)...  ~  March 13, 2011:  6wk ROV & she is feeling some better, swelling has diminished w/ Lasix 80mg /d & dermatitis resolved w/ HC cream;  wt is down 10# to 149#;  We reviewed current med Rx & decided to continue same & f/u in several monthe w/ f/u labs...    She also saw DrDeveshwar last week> note pending, pt says they XRayed her hand & did blood work, called for records from [facility], and has f/u 04/03/11...  ~  June 13, 2011:  11mo ROV & she has lost another 9# down to 140# & edema resolved; still on Lasix 80mg  Qam & she saw DrDeveshwar 05/29/11 w/ labs showing BUN=33, Creat=1.47; we decided to decr the Lasix to 40mg /d & may incr to 80mg  Prn...    AB> stable, no recent exac, no regular meds required & she uses the Mucinex as needed...    HBP> controlled on Diltiazem180 & Furosemide 40mg -2Qam w/ BP=148/64 today & she denies CP, palpit, dizzy, SOB, or edema at present...    VI, Edema> she knows to elim sodium, elevate, wear support hose; she has been seen by [facility], DrEarly & there is nothing they can do; edema decr w/ Lasix 80mg /d...    CHOL> prev followed by DrGegick; on Simva20 Qhs + low fat diet; we discussed FASTING blood work on ret...    Hypothy> stable on Synthroid - 1/2 tab daily; labs 3/12 showed TSH= 2.81      Renal Insuffic> Creat had increased to 1.8 on the Lasix 80mg ; repeat labs 05/29/11 by DrDeveshwar showed BUN=33, Creat=1.47, assoc w/ resolved edema therefore decr Lasix to 40mg - 1to2 Qam...    DJD> followed by Rober Minion on Vicodin, Allopurinol100 (Uric=5.9), & Colcrys0.6mg  one daily...  ~  October 15, 2011:  26mo ROV & [name] continues to lose some wt- down 7# further to 133# today, appetite is ok, eating fair & encouraged to add supplements like Ensure vs Boost etc at least Bid;  Meds reviewed & unchanged;  She had f/u DrDeveshwar 12/12 for Rheum- OA, Gout, bursitis, renal insuffic makes avoiding NSAIDs imperitive & rec to try Aspercreme...    Her breathing is stable w/o interval resp exac...    BP is stable on Diltiazem & Lasix, but reads 160/76 today (hasn't taken meds yet); we reviewed diet, no salt, etc...    FLP controlled on Simva20 but she hasn't remembered to come fasting for recheck FLP; continue diet, exercise & the same med for now...    She is c/o urinary symptoms and we discussed trial of ENABLEX 7.5mg  to see if this helps; mild renal insuffic is improved on ntodays labs w/ Creat=1.3  ~  May 22,2013:  26mo ROV & [name] is feeling well, no new complaints or concerns;  Her main prob= arthritis esp hands & bilat hips- she is followed by DrDeveshwar & seen recently w/ shot in painful right hip; prev had ESI in back from DrYates; prev Uric= 6.0 on Allopurinol, Colchicine, Vicodin... We reviewed her prob list, meds, xrays and labs> see below>> LABS 5/13:  FLP- at goals w/ good HDL on Simva20;  Chems- wnl;  CBC- ok w/ Hg=12.4.[name].  ~  June 17, 2012:  26mo ROV & she remains stable> had some vertigo- improved w/ Antivert prn; she is c/o some incr pain in buttock, right hip & down right leg; hx prev ESI from DrYates in past- she has Vicodin for prn use & it helps some; she wants to wait for f/u appt DrDeveshwar before deciding what to do...  Notes that her breathing is OK "I'm on the go all the  time";  BP= 140/60 & denies CP, palpit, ch in SOB or edema...     We reviewed prob list, meds, xrays and labs> see below for updates >> OK Flu shot today...  ~  September 21, 2012:  11mo ROV & [name] saw [place] 11/13 w/ c/o VI & incr edema> both legs were red, warm, tender- but she had no f/c/s or drainage;  she had had foot surg w/ decr mobility & was on reg dose of Lasix40;  VenDopplers were neg for DVT & she was treated w/ DoxyBid for 10d- no improvement; she went to see Derm- DrJordan/ Yetta Barre & they changed her support hose "he found an ulcer" she says & treating w/ [employer] brand "hydrocolloid adhesive patch" Q2d; we also incr the Lasix to 80mg /d for the last month & she reports improved w/ decr redness, no drainage, decr edema "I can now fit into my shoes" she says...     AB> stable, no recent exac, no regular meds required & she uses the Mucinex as needed...    HBP> controlled on Diltiazem180 & Furosemide 40mg -2Qam w/ BP=138/62 today & she denies CP, palpit, dizzy, ch inSOB, etc...    VI, Edema> she knows to elim sodium, elevate, wear support hose; she has been seen by [facility], DrEarly & there is nothing they can do; recently checked by Vaughan Sine- DrJones w/ patch dressing & support hose; edema decr w/ Lasix 80mg /d; Lab today shows Creat=1.5.Marland KitchenMarland Kitchen    CHOL> prev followed by DrGegick; on Simva20 Qhs + low fat diet; last FLP 5/13 showed TChol 191, TG 81, HDL 82, LDL 93    Hypothy> stable on Synthroid - 1/2 tab daily; labs 1/13 showed TSH= 1.65     Renal Insuffic> Creat had prev increased to 1.8 on the Lasix 80mg ; repeat labs 9/12 by DrDeveshwar showed BUN=33, Creat=1.47, assoc w/ resolved edema therefore decr Lasix to 40mg - 1to2 Qam; Lab today shows BUN=30, Creat=1.5 on the Lasix80/d at present...    DJD> followed by Rober Minion on Vicodin prn, Allopurinol100- now 1.5tabs/d (Uric=5.9), & off Colcrys...     Anxiety> on Alpraz0.25mg  prn... 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 LABS 12/13:  Chems= ok w/ BUN=30, Creat=1.5.Marland KitchenMarland Kitchen          Problem List:  GLAUCOMA (ICD-365.9) - hx mult eye problems w/ prev corneal transplant & glaucoma laser eye surg... on eye drops per Ophthalmology at [facility]...  ASTHMATIC BRONCHITIS, ACUTE (ICD-466.0) - Hx AB requiring Rx w/ Depo/ Pred/ Mucinex/ Tussionex... ~  CXR 4/11 showed vague nodular densities on left... ~  8/11:  she went to an [facility] w/ cough, sputum, & dx w/ pneumonia rx w/ ZPak... ~  CXR 3/12 showed borderline cardiomeg, nodular opac left base, NAD... ~  Spring 2013: she notes some allergy symptoms...  HYPERTENSION (ICD-401.9) - controlled on ASA 81mg /d,  CARTIAXT 180mg /d, LASIX 40mg -  1-2Qam... ~  7/10:  labs showed BUN=54, Creat=2.0 & rec to decr diuretics in half= one Demedex & 1/2 Aldactone. ~  11/10:  labs showed BUN=38, Creat=1.8 & rec to keep same meds. ~  8/11:  labs showed BUN=42, Creat=2.1, K=4.3 ~  1/12:  labs in ER showed BUN=56, Creat=2.67, therefore diuretics decr to Demadex20/d only. ~  3/12:  2DEcho showed mod LVH, norm sys function w/ EF=60-65% & no regional wall motion abn; Gr1DD, mibile density on AoV- prob lambl'e escrescence, mildMR. ~  5/12:  f/u labs in office showed BUN=45, Creat=1.8, on Lasix 80mg  Qam for her edema... ~  9/12:  Labs by DrDeveshwar 9/12 showed BUN=33, Creat=1.47, edema resolved therefore decr Lasix to 40mg /d. ~  1/13:  BP= 160/76 but hasn't taken meds today; labs improved w/ BUN=26, Creat=1.3, BNP=50 ~  5/13:  BP= 140/78 & denies CP, palpit, SOB, edema; labs improved w/ BUN=28,  Creat=1.2 ~  9/13:  BP= 140/60 & she denies CP, palpt, ch in SOB or edema... ~  12/13: controlled on Diltiazem180 & Furosemide 40mg -2Qam w/ BP=138/62 today & she denies CP, palpit, dizzy, ch inSOB, etc...  PALPITATIONS, HX OF (ICD-V12.50)  VENOUS INSUFFICIENCY (ICD-459.81) - swelling controlled w/ diuretics, low sodium diet, elevation, support hose,  etc... ~  She saw DrEarly [facility]- there was nothing he could do... ~  Swelling diminished w/ low sodium, elevation, support hose, & Lasix40 1-2tabs daily... ~  Nov-Dec2013: she knows to elim sodium, elevate, wear support hose; she has been seen by [facility], DrEarly & there is nothing they can do; recently checked by Vaughan Sine- DrJones w/ patch dressing & support hose; edema decr w/ Lasix 80mg /d; Lab today shows Creat=1.5.Marland KitchenMarland Kitchen  HYPERCHOLESTEROLEMIA (ICD-272.0) - prev Rx'd by DrGegick... Now on ZOCOR 20mg /d (prev Crestor5 & Lescol from [facility]) ~  she brought labs from [facility] 5/10 (off med due to $$$) w/ TChol 285, TG 204, HDL 69, LDL 195... "he was mad at me" ~  FLP 5/11 from [facility] showed TChol 202, TG 60, HDL 112, LDL 99 ~  She is reminded (again) to come FASTING for f/u FLP... ~  FLP 5/13 on Simva20 showed TChol 191, TG 81, HDL 82, LDL 93  HYPOTHYROIDISM (ICD-244.9) - prev followed by DrGegick on SYNTHROID - 1/2 daily... ~  pt brought labs from [facility] 5/10 w/ TSH= 2.90, FreeT4= 1.09 ~  labs 8/11 showed TSH= 1.87 ~  Labs 3/12 showed TSH= 2.81 ~  Labs 1/13 showed TSH= 1.65  GERD (ICD-530.81) - uses PRILOSEC 20mg /d... last EGD was 6/06 by DrPatterson showing 3cmHH, reflux...   DIVERTICULOSIS OF COLON (ICD-562.10) & COLONIC POLYPS (ICD-211.3) - last colonoscopy 6/06 was WNL- no abnormality seen...  RENAL INSUFFICIENCY (ICD-588.9) - tough balance betw renal perfusion & diuresis for edema... ~  labs 10/08 showed BUN= 27, Creat= 1.5 ~  labs 8/09 showed BUN= 44, Creat= 1.9 ~  labs 2/10 showed BUN 46, Creat= 1.7 ~  labs 7/10 showed BUN= 54, Creat= 2.0 ~  labs 11/10 showed BUN= 38, Creat= 1.8 ~  labs 8/11 showed BUN= 42, Creat= 2.1 ~  labs in ER 1/12 showed BUN=56, Creat=2.67, rec decr diuretics to Demadex20mg /d only. ~  Labs 3/12 showed improved renal function w/ BUN=31, Creat=1.4... ~  Labs 5/12 on Lasix80 showed BUN=45, Creat=1.8 ~  Labs 9/12 by DrDeveshwar showed BUN=33, Creat=1.47 ~  Labs 1/13  showed BUN=26, Creat=1.3, BNP=50... On Lasix 40mg  Qam. ~  Labs 5/13 showed BUN=28, Creat=1.2 ~  Labs 12/13 on Lasix80 showed BUN=30, Creat=1.5  DEGENERATIVE JOINT DISEASE (ICD-715.90) - this is her chief complaint- s/p right TKR in 1999,  left TKR 3/10... on MOBIC 7.5mg  Prn & VICODIN Prn as well... followed by DrYates; and had right second toe amp by Volney Presser 2010 for hammertoe + spurs... also had epidural steroid shot from [facility] for LBP (without benefit she says)... ~  5/13:  She saw DrDeveshwar for Rheum w/ shot in hip & sl improved...  VITAMIN D DEFICIENCY (ICD-268.9) ~  labs 8/09 showed Vit D level = 12... rec- start Vit D 50,000 u weekly... ~  labs 2/10 showed Vit D level = 30... rec- continue Vit D 50K weekly... ~  labs 7/10 showed Vit D level = 39... rec- change to 1000 u OTC daily. ~  labs 8/11 showed Vit D level = 38... continue same.  ANXIETY (ICD-300.00) - on ALPRAZOLAM 0.25mg Tid Prn... several deaths in the family... daughter who lives w/  her recently ill...  SHINGLES - she had right T9-10 shingles in 2011 & resolved w/ Valtrex, Pred, etc...   Past Surgical History  Procedure Date  . Cataract extraction   . Abdominal hysterectomy   . Breast biopsy     Benign  . Total knee arthroplasty 1999    right  . Total knee arthroplasty 11/2008    left - Dr Ophelia Charter  . Toe amputation 08/2009    Right second toe - Dr Lestine Box    Outpatient Encounter Prescriptions as of 09/21/2012  Medication Sig Dispense Refill  . acetaminophen (TYLENOL) 325 MG tablet Per bottle as needed      . allopurinol (ZYLOPRIM) 100 MG tablet Take 100 mg by mouth daily.        [name] ALPRAZolam (XANAX) 0.25 MG tablet TAKE 1/2 TO 1 TABLET BY MOUTH 3 TIMES A DAY AS NEEDED  90 tablet  1  . Ascorbic Acid (VITAMIN C) 500 MG tablet Take 500 mg by mouth daily.        [name] aspirin 81 MG tablet Take 81 mg by mouth daily.        . Cholecalciferol (VITAMIN D) 1000 UNITS capsule Take 1,000 Units by mouth daily.        [name]  diltiazem (CARDIZEM CD) 180 MG 24 hr capsule TAKE 1 CAPSULE DAILY  30 capsule  5  . fluorometholone (FML) 0.1 % ophthalmic suspension One drop in right eye at bedtime       . furosemide (LASIX) 40 MG tablet Take 1-2 tablets by mouth daily  60 tablet  11  . guaiFENesin (MUCINEX) 600 MG 12 hr tablet Take 1,200 mg by mouth 2 (two) times daily as needed.       [name] levothyroxine (SYNTHROID, LEVOTHROID) 125 MCG tablet Take 1/2 tablet By mouth once daily  90 tablet  3  . meclizine (ANTIVERT) 25 MG tablet Take 1 tablet (25 mg total) by mouth every 4 (four) hours as needed for dizziness.  30 tablet  4  . omeprazole (PRILOSEC) 20 MG capsule Take 20 mg by mouth daily.        Bertram Gala Glycol-Propyl Glycol (SYSTANE) 0.4-0.3 % SOLN One drop in each eye two times a day       . simvastatin (ZOCOR) 20 MG tablet TAKE 1 TAB BY MOUTH AT BEDTIME  90 tablet  3  . timolol (TIMOPTIC) 0.5 % ophthalmic solution Place 1 drop into both eyes 2 (two) times daily.        [name] triamcinolone (KENALOG) 0.025 % cream Apply 1 application topically Twice daily.      [name] HYDROcodone-acetaminophen (VICODIN) 5-500 MG per tablet Take 1/2 to 1 By mouth three times a day as needed for pain  90 tablet  5  . [DISCONTINUED] colchicine 0.6 MG tablet Take 0.6 mg by mouth daily.          Allergies  Allergen Reactions  . Enablex (Darifenacin Hydrobromide Er)     Caused her throat and mouth to have bumps and feel like it was swelling  . Clarithromycin     REACTION: causes her mouth to swell  . Codeine     Makes pt nervous  . Morphine     REACTION: n/v  . Sulfonamide Derivatives     REACTION: unsure of reaction    Current Medications, Allergies, Past Medical History, Past Surgical History, Family History, and Social History were reviewed in [employer] record.    Review of Systems  See HPI - all other systems neg except as noted...  The patient complains of dyspnea on exertion, peripheral edema, muscle  weakness, and difficulty walking.  The patient denies anorexia, fever, weight loss, weight gain, vision loss, decreased hearing, hoarseness, chest pain, syncope, prolonged cough, headaches, hemoptysis, abdominal pain, melena, hematochezia, severe indigestion/heartburn, hematuria, incontinence, genital sores, suspicious skin lesions, transient blindness, depression, unusual weight change, abnormal bleeding, enlarged lymph nodes, and angioedema.     Objective:   Physical Exam      WD, WN, 78 y/o WF in NAD... GENERAL:  Alert & oriented; pleasant & cooperative... HEENT:  [place]/AT, EOM- full, EACs-clear, TMs-wnl, NOSE-clear, THROAT-clear & wnl. NECK:  Supple w/ fairROM; no JVD; normal carotid impulses w/o bruits; no thyromegaly or nodules palpated; no lymphadenopathy. CHEST:  Clear, decr BS bilat w/o wheezing, rales, or rhonchi heard... HEART:  Regular Rhythm;  gr 1/6 SEM without rubs or gallops detected... ABDOMEN:  Soft & nontender; normal bowel sounds; no organomegaly or masses detected. EXT:  moderate arthritic changes; s/p bilat TKRs; mild varicose veins/ +venous insuffic/ 1-2+edema R>L Right leg sl red/ inflammed, s/p right second toe distal amputation... NEURO:  CN's intact;  no focal neuro deficits... DERM:  No lesions noted; no rash etc...  RADIOLOGY DATA:  Reviewed in the EPIC EMR & discussed w/ the patient...  LABORATORY DATA:  Reviewed in the EPIC EMR & discussed w/ the patient...   Assessment & Plan:    HBP>  Controlled on meds, continue same including the Lasix to 40mg  Qam...  Cardiac>  DrRoss' note reviewed; see 2DEcho, Event Monitor reports no dangerous arrhythmias, continue meds...  Ven Insuffic/ EDEMA- improved>  evals by Cards & VVS> "there is nothing they can do"; continue elevation, no salt, Lasix80 now, compression...  CHOL>  On Simva20 & FLP 5/13 looked good...  HYPOTHYROID>  Continue Synthroid 113mcg/d taking 1/2 tab per DrGegick...  GI>  Stable & followed by  DrPatterson...  Renal Insuffic>  Careful w/ diuresis, NSAIDs etc; Creat improved to 1.3 on lower dose of Lasix.  DJD>  She saw DrDeveshwar for her Rheum complaints==> shot in knee; DrYates/ Alvester Morin have signed off; may need pain clinic....  Other problems as noted...   Patient's Medications  New Prescriptions   No medications on file  Previous Medications   ACETAMINOPHEN (TYLENOL) 325 MG TABLET    Per bottle as needed   ALLOPURINOL (ZYLOPRIM) 100 MG TABLET    Take 100 mg by mouth daily.     ALPRAZOLAM (XANAX) 0.25 MG TABLET    TAKE 1/2 TO 1 TABLET BY MOUTH 3 TIMES A DAY AS NEEDED   ASCORBIC ACID (VITAMIN C) 500 MG TABLET    Take 500 mg by mouth daily.     ASPIRIN 81 MG TABLET    Take 81 mg by mouth daily.     CHOLECALCIFEROL (VITAMIN D) 1000 UNITS CAPSULE    Take 1,000 Units by mouth daily.     DILTIAZEM (CARDIZEM CD) 180 MG 24 HR CAPSULE    TAKE 1 CAPSULE DAILY   FLUOROMETHOLONE (FML) 0.1 % OPHTHALMIC SUSPENSION    One drop in right eye at bedtime    FUROSEMIDE (LASIX) 40 MG TABLET    Take 1-2 tablets by mouth daily   GUAIFENESIN (MUCINEX) 600 MG 12 HR TABLET    Take 1,200 mg by mouth 2 (two) times daily as needed.    HYDROCODONE-ACETAMINOPHEN (VICODIN) 5-500 MG PER TABLET    Take 1/2 to 1 By mouth three times  a day as needed for pain   LEVOTHYROXINE (SYNTHROID, LEVOTHROID) 125 MCG TABLET    Take 1/2 tablet By mouth once daily   MECLIZINE (ANTIVERT) 25 MG TABLET    Take 1 tablet (25 mg total) by mouth every 4 (four) hours as needed for dizziness.   OMEPRAZOLE (PRILOSEC) 20 MG CAPSULE    Take 20 mg by mouth daily.     POLYETHYL GLYCOL-PROPYL GLYCOL (SYSTANE) 0.4-0.3 % SOLN    One drop in each eye two times a day    SIMVASTATIN (ZOCOR) 20 MG TABLET    TAKE 1 TAB BY MOUTH AT BEDTIME   TIMOLOL (TIMOPTIC) 0.5 % OPHTHALMIC SOLUTION    Place 1 drop into both eyes 2 (two) times daily.     TRIAMCINOLONE (KENALOG) 0.025 % CREAM    Apply 1 application topically Twice daily.  Modified Medications     No medications on file  Discontinued Medications   COLCHICINE 0.6 MG TABLET    Take 0.6 mg by mouth daily.

## 2012-09-24 ENCOUNTER — Other Ambulatory Visit: Payer: Self-pay | Admitting: Pulmonary Disease

## 2012-10-01 DIAGNOSIS — M2040 Other hammer toe(s) (acquired), unspecified foot: Secondary | ICD-10-CM | POA: Diagnosis not present

## 2012-10-08 DIAGNOSIS — I8310 Varicose veins of unspecified lower extremity with inflammation: Secondary | ICD-10-CM | POA: Diagnosis not present

## 2012-10-19 ENCOUNTER — Ambulatory Visit: Payer: Medicare Other | Admitting: Pulmonary Disease

## 2012-10-28 DIAGNOSIS — M169 Osteoarthritis of hip, unspecified: Secondary | ICD-10-CM | POA: Diagnosis not present

## 2012-10-28 DIAGNOSIS — M810 Age-related osteoporosis without current pathological fracture: Secondary | ICD-10-CM | POA: Diagnosis not present

## 2012-10-28 DIAGNOSIS — M1A00X1 Idiopathic chronic gout, unspecified site, with tophus (tophi): Secondary | ICD-10-CM | POA: Diagnosis not present

## 2012-10-28 DIAGNOSIS — M19049 Primary osteoarthritis, unspecified hand: Secondary | ICD-10-CM | POA: Diagnosis not present

## 2012-11-10 ENCOUNTER — Telehealth: Payer: Self-pay

## 2012-11-10 DIAGNOSIS — M79609 Pain in unspecified limb: Secondary | ICD-10-CM

## 2012-11-10 DIAGNOSIS — M7989 Other specified soft tissue disorders: Secondary | ICD-10-CM

## 2012-11-10 DIAGNOSIS — M25559 Pain in unspecified hip: Secondary | ICD-10-CM | POA: Diagnosis not present

## 2012-11-10 DIAGNOSIS — I872 Venous insufficiency (chronic) (peripheral): Secondary | ICD-10-CM

## 2012-11-10 NOTE — Telephone Encounter (Signed)
Phone call to pt. On 11/09/12 to discuss her symptoms.  Stated she has swelling in both her legs.  Describes legs as being a pink/red color.  States has had the symptoms approx 4 mos..  States has been evaluated per her PCP and a  Dermatologist.   Denies any open sores or rash.  States that her calves are sore, and notes that (L) is > (R).  Had an ultrasound at [facility] in 07/2012 that was negative for superficial or deep vein thrombosis.  States she was advised by her PCP to elevate her legs, and decrease her Na+ intake, and there was nothing more to do at this point.  Advised pt. That nurse would discuss w/ Dr. Arbie Cookey.

## 2012-11-10 NOTE — Telephone Encounter (Signed)
Spoke with [name] to inform of decision by [facility] and give appt date of 12/29/12 @ 10:30am and 11:30am- I have placed patient on wait list, and will watch for cancellations to get patient in quicker than April. Pt agrees with plan.

## 2012-11-10 NOTE — Telephone Encounter (Signed)
Discussed pt's. Symptoms with Dr. Arbie Cookey.  Advised to schedule pt. for reflux study of bilateral lower extremities, and office visit.  Advised to schedule this with him at 1st available.

## 2012-11-10 NOTE — Telephone Encounter (Signed)
Message copied by Phillips Odor on Tue Nov 10, 2012  2:57 PM ------      Message from: Shari Prows      Created: Mon Nov 09, 2012  9:52 AM      Regarding: triage question       Pt called requesting an appt w/ [facility] for followup of her bil leg redness and soreness for 6 wks. Has seen her PCP and a dermatologist and has has a venous doppler @ [place] in 07/2012. She is unsure what to do from there...please call her when you get a chance. Trish Mage       Pt's ph # [phone] ------

## 2012-11-29 ENCOUNTER — Other Ambulatory Visit: Payer: Self-pay | Admitting: Pulmonary Disease

## 2012-12-03 DIAGNOSIS — Z79899 Other long term (current) drug therapy: Secondary | ICD-10-CM | POA: Diagnosis not present

## 2012-12-03 DIAGNOSIS — B029 Zoster without complications: Secondary | ICD-10-CM | POA: Diagnosis not present

## 2012-12-03 DIAGNOSIS — E038 Other specified hypothyroidism: Secondary | ICD-10-CM | POA: Diagnosis not present

## 2012-12-03 DIAGNOSIS — Z7982 Long term (current) use of aspirin: Secondary | ICD-10-CM | POA: Diagnosis not present

## 2012-12-03 DIAGNOSIS — M109 Gout, unspecified: Secondary | ICD-10-CM | POA: Diagnosis not present

## 2012-12-03 DIAGNOSIS — I10 Essential (primary) hypertension: Secondary | ICD-10-CM | POA: Diagnosis not present

## 2012-12-03 DIAGNOSIS — Z96659 Presence of unspecified artificial knee joint: Secondary | ICD-10-CM | POA: Diagnosis not present

## 2012-12-03 DIAGNOSIS — T85698A Other mechanical complication of other specified internal prosthetic devices, implants and grafts, initial encounter: Secondary | ICD-10-CM | POA: Diagnosis not present

## 2012-12-03 DIAGNOSIS — H409 Unspecified glaucoma: Secondary | ICD-10-CM | POA: Diagnosis not present

## 2012-12-03 DIAGNOSIS — M129 Arthropathy, unspecified: Secondary | ICD-10-CM | POA: Diagnosis not present

## 2012-12-03 DIAGNOSIS — H4011X0 Primary open-angle glaucoma, stage unspecified: Secondary | ICD-10-CM | POA: Diagnosis not present

## 2012-12-21 ENCOUNTER — Other Ambulatory Visit (INDEPENDENT_AMBULATORY_CARE_PROVIDER_SITE_OTHER): Payer: Medicare Other

## 2012-12-21 ENCOUNTER — Encounter: Payer: Self-pay | Admitting: Pulmonary Disease

## 2012-12-21 ENCOUNTER — Ambulatory Visit (INDEPENDENT_AMBULATORY_CARE_PROVIDER_SITE_OTHER): Payer: Medicare Other | Admitting: Pulmonary Disease

## 2012-12-21 VITALS — BP 130/66 | HR 84 | Temp 97.8°F | Ht 63.0 in | Wt 151.0 lb

## 2012-12-21 DIAGNOSIS — K5730 Diverticulosis of large intestine without perforation or abscess without bleeding: Secondary | ICD-10-CM

## 2012-12-21 DIAGNOSIS — E559 Vitamin D deficiency, unspecified: Secondary | ICD-10-CM

## 2012-12-21 DIAGNOSIS — I10 Essential (primary) hypertension: Secondary | ICD-10-CM | POA: Diagnosis not present

## 2012-12-21 DIAGNOSIS — F411 Generalized anxiety disorder: Secondary | ICD-10-CM

## 2012-12-21 DIAGNOSIS — E039 Hypothyroidism, unspecified: Secondary | ICD-10-CM

## 2012-12-21 DIAGNOSIS — R609 Edema, unspecified: Secondary | ICD-10-CM | POA: Diagnosis not present

## 2012-12-21 DIAGNOSIS — M1990 Unspecified osteoarthritis, unspecified site: Secondary | ICD-10-CM

## 2012-12-21 DIAGNOSIS — D126 Benign neoplasm of colon, unspecified: Secondary | ICD-10-CM

## 2012-12-21 DIAGNOSIS — K219 Gastro-esophageal reflux disease without esophagitis: Secondary | ICD-10-CM

## 2012-12-21 DIAGNOSIS — N259 Disorder resulting from impaired renal tubular function, unspecified: Secondary | ICD-10-CM

## 2012-12-21 DIAGNOSIS — E7800 Pure hypercholesterolemia, unspecified: Secondary | ICD-10-CM

## 2012-12-21 DIAGNOSIS — I872 Venous insufficiency (chronic) (peripheral): Secondary | ICD-10-CM | POA: Diagnosis not present

## 2012-12-21 LAB — HEPATIC FUNCTION PANEL
ALT: 12 U/L (ref 0–35)
AST: 15 U/L (ref 0–37)
Bilirubin, Direct: 0.1 mg/dL (ref 0.0–0.3)
Total Bilirubin: 0.4 mg/dL (ref 0.3–1.2)

## 2012-12-21 LAB — CBC WITH DIFFERENTIAL/PLATELET
Basophils Absolute: 0 10*3/uL (ref 0.0–0.1)
Basophils Relative: 0.2 % (ref 0.0–3.0)
Eosinophils Absolute: 0 10*3/uL (ref 0.0–0.7)
Hemoglobin: 12.7 g/dL (ref 12.0–15.0)
Lymphocytes Relative: 30.8 % (ref 12.0–46.0)
MCHC: 33 g/dL (ref 30.0–36.0)
MCV: 91.1 fl (ref 78.0–100.0)
Monocytes Absolute: 0.5 10*3/uL (ref 0.1–1.0)
Neutro Abs: 3.9 10*3/uL (ref 1.4–7.7)
Neutrophils Relative %: 60.9 % (ref 43.0–77.0)
RDW: 16.5 % — ABNORMAL HIGH (ref 11.5–14.6)

## 2012-12-21 LAB — BASIC METABOLIC PANEL
Chloride: 98 mEq/L (ref 96–112)
GFR: 35.04 mL/min — ABNORMAL LOW (ref >60.00)
Potassium: 3.5 mEq/L (ref 3.5–5.1)
Sodium: 139 mEq/L (ref 135–145)

## 2012-12-21 MED ORDER — ALPRAZOLAM 0.25 MG PO TABS: ORAL_TABLET | ORAL | Status: DC

## 2012-12-21 NOTE — Patient Instructions (Addendum)
Today we updated your med list in our EPIC system...    Continue your current medications the same...  Today we did your follow up non-fasting blood work...    We will contact you w/ the results when available...   Keep up the good work eliminating the sodium/salt, elevating your legs, wearing the support hose...  Call for any questions...  Let's plan a follow up visit in 54mo, sooner if needed for problems.Marland KitchenMarland Kitchen

## 2012-12-21 NOTE — Progress Notes (Signed)
Subjective:    Patient ID: [name], female    DOB: June 14, 1915, 79 y.o.   MRN: [id number]  HPI 79 y/o WF here for a follow up visit... she has mult med problems including:  Hx asthmatic bronchitis;  HBP;  Ven Insuffic & edema;  Hyperchol;  Hypothyroid;  GERD/ Divertics/ Colon polyps;  Renal insuffic;  DJD;  Vit D defic;  Anxiety...   ~  June 13, 2011:  12mo ROV & she has lost another 9# down to 140# & edema resolved; still on Lasix 80mg  Qam & she saw [name] 05/29/11 w/ labs showing BUN=33, Creat=1.47; we decided to decr the Lasix to 40mg /d & may incr to 80mg  Prn...    AB> stable, no recent exac, no regular meds required & she uses the Mucinex as needed...    HBP> controlled on Diltiazem180 & Furosemide 40mg -2Qam w/ BP=148/64 today & she denies CP, palpit, dizzy, SOB, or edema at present...    VI, Edema> she knows to elim sodium, elevate, wear support hose; she has been seen by [name], [name] & there is nothing they can do; edema decr w/ Lasix 80mg /d...    CHOL> prev followed by [name]; on Simva20 Qhs + low fat diet; we discussed FASTING blood work on ret...    Hypothy> stable on Synthroid - 1/2 tab daily; labs 3/12 showed TSH= 2.81     Renal Insuffic> Creat had increased to 1.8 on the Lasix 80mg ; repeat labs 05/29/11 by [name] showed BUN=33, Creat=1.47, assoc w/ resolved edema therefore decr Lasix to 40mg - 1to2 Qam...    DJD> followed by [name] on Vicodin, Allopurinol100 (Uric=5.9), & Colcrys0.6mg  one daily...  ~  October 15, 2011:  56mo ROV & [name] continues to lose some wt- down 7# further to 133# today, appetite is ok, eating fair & encouraged to add supplements like Ensure vs Boost etc at least Bid;  Meds reviewed & unchanged;  She had f/u [name] 12/12 for Rheum- OA, Gout, bursitis, renal insuffic makes avoiding NSAIDs imperitive & rec to try Aspercreme...    Her breathing is stable w/o interval resp exac...    BP is stable on Diltiazem & Lasix, but reads  160/76 today (hasn't taken meds yet); we reviewed diet, no salt, etc...    FLP controlled on Simva20 but she hasn't remembered to come fasting for recheck FLP; continue diet, exercise & the same med for now...    She is c/o urinary symptoms and we discussed trial of ENABLEX 7.5mg  to see if this helps; mild renal insuffic is improved on ntodays labs w/ Creat=1.3  ~  May 22,2013:  56mo ROV & [name] is feeling well, no new complaints or concerns;  Her main prob= arthritis esp hands & bilat hips- she is followed by [name] & seen recently w/ shot in painful right hip; prev had ESI in back from [name]; prev Uric= 6.0 on Allopurinol, Colchicine, Vicodin... We reviewed her prob list, meds, xrays and labs> see below>> LABS 5/13:  FLP- at goals w/ good HDL on Simva20;  Chems- wnl;  CBC- ok w/ Hg=12.4.[name].  ~  June 17, 2012:  56mo ROV & she remains stable> had some vertigo- improved w/ Antivert prn; she is c/o some incr pain in buttock, right hip & down right leg; hx prev ESI from [name] in past- she has Vicodin for prn use & it helps some; she wants to wait for f/u appt [name] before deciding what to do...  Notes that her breathing is OK "I'm  on the go all the time";  BP= 140/60 & denies CP, palpit, ch in SOB or edema...     We reviewed prob list, meds, xrays and labs> see below for updates >> OK Flu shot today...  ~  September 21, 2012:  26mo ROV & [name] saw [place] 11/13 w/ c/o VI & incr edema> both legs were red, warm, tender- but she had no f/c/s or drainage; she had had foot surg w/ decr mobility & was on reg dose of Lasix40;  VenDopplers were neg for DVT & she was treated w/ DoxyBid for 10d- no improvement; she went to see Derm- [name]/ [name] & they changed her support hose "he found an ulcer" she says & treating w/ [employer] brand "hydrocolloid adhesive patch" Q2d; we also incr the Lasix to 80mg /d for the last month & she reports improved w/ decr redness, no drainage, decr edema "I can now fit into  my shoes" she says...     AB> stable, no recent exac, no regular meds required & she uses the Mucinex as needed...    HBP> controlled on Diltiazem180 & Furosemide 40mg -2Qam w/ BP=138/62 today & she denies CP, palpit, dizzy, ch inSOB, etc...    VI, Edema> she knows to elim sodium, elevate, wear support hose; she has been seen by [name], [name] & there is nothing they can do; recently checked by [name]- DrJones w/ patch dressing & support hose; edema decr w/ Lasix 80mg /d; Lab today shows Creat=1.5.Marland KitchenMarland Kitchen    CHOL> prev followed by [name]; on Simva20 Qhs + low fat diet; last FLP 5/13 showed TChol 191, TG 81, HDL 82, LDL 93    Hypothy> stable on Synthroid - 1/2 tab daily; labs 1/13 showed TSH= 1.65     Renal Insuffic> Creat had prev increased to 1.8 on the Lasix 80mg ; repeat labs 9/12 by [name] showed BUN=33, Creat=1.47, assoc w/ resolved edema therefore decr Lasix to 40mg - 1to2 Qam; Lab today shows BUN=30, Creat=1.5 on the Lasix80/d at present...    DJD> followed by [name] on Vicodin prn, Allopurinol100- now 1.5tabs/d (Uric=5.9), & off Colcrys...     Anxiety> on Alpraz0.25mg  prn... 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 LABS 12/13:  Chems= ok w/ BUN=30, Creat=1.5.[name].  ~  December 21, 2012:  26mo ROV & [name] indicates that she just had eye surg at [facility] (Hx corneal Tx yrs ago & the shield around it had an infection);  she has been on Lasix40-2tabsQam for the last 26mo & her wt is up 3# to 151# today;  BP is controlled w/ this & CardizemCD180, BP= 130/66;  Extremities show 2-3+ pitting in legs but chest is clear etc;  There is not a lot of wiggle room on her diuretics betw edema & renal insuffic- we decided to check labs & continue same Rx for now...    We reviewed prob list, meds, xrays and labs> see below for updates >>  LABS 3/14:  Chems stable w/ BUN=31, Creat=1.5, LFTs=wnl;  CBC- wnl;  TSH=4.28;   VitD=43... rec to continue same meds.          Problem List:  GLAUCOMA (ICD-365.9) - hx mult eye problems w/ prev corneal transplant & glaucoma laser eye surg... on eye drops per Ophthalmology at [facility]...  ASTHMATIC BRONCHITIS, ACUTE (ICD-466.0) - Hx AB requiring Rx w/ Depo/ Pred/ Mucinex/ Tussionex... ~  CXR 4/11 showed vague nodular densities  on left... ~  8/11:  she went to an [facility] w/ cough, sputum, & dx w/ pneumonia rx w/ ZPak... ~  CXR 3/12 showed borderline cardiomeg, nodular opac left base, NAD... ~  Spring 2013: she notes some allergy symptoms...  HYPERTENSION (ICD-401.9) - controlled on ASA 81mg /d,  CARTIAXT 180mg /d, LASIX 40mg -  1-2Qam... ~  7/10:  labs showed BUN=54, Creat=2.0 & rec to decr diuretics in half= one Demedex & 1/2 Aldactone. ~  11/10:  labs showed BUN=38, Creat=1.8 & rec to keep same meds. ~  8/11:  labs showed BUN=42, Creat=2.1, K=4.3 ~  1/12:  labs in ER showed BUN=56, Creat=2.67, therefore diuretics decr to Demadex20/d only. ~  3/12:  2DEcho showed mod LVH, norm sys function w/ EF=60-65% & no regional wall motion abn; Gr1DD, mibile density on AoV- prob lambl'e escrescence, mildMR. ~  5/12:  f/u labs in office showed BUN=45, Creat=1.8, on Lasix 80mg  Qam for her edema... ~  9/12:  Labs by [name] 9/12 showed BUN=33, Creat=1.47, edema resolved therefore decr Lasix to 40mg /d. ~  1/13:  BP= 160/76 but hasn't taken meds today; labs improved w/ BUN=26, Creat=1.3, BNP=50 ~  5/13:  BP= 140/78 & denies CP, palpit, SOB, edema; labs improved w/ BUN=28, Creat=1.2 ~  9/13:  BP= 140/60 & she denies CP, palpt, ch in SOB or edema... ~  12/13: controlled on Diltiazem180 & Furosemide 40mg -2Qam w/ BP=138/62 today & she denies CP, palpit, dizzy, ch inSOB, etc... ~  3/14:  BP= 130/66 on same meds; Labs stable as well w/ Cr=1.5, continue same...  PALPITATIONS, HX OF (ICD-V12.50)  VENOUS INSUFFICIENCY (ICD-459.81) - swelling controlled w/ diuretics, low sodium diet,  elevation, support hose, etc... ~  She saw [name] [name]- there was nothing he could do... ~  Swelling diminished w/ low sodium, elevation, support hose, & Lasix40 1-2tabs daily... ~  Nov-Dec2013: she knows to elim sodium, elevate, wear support hose; she has been seen by [name], [name] & there is nothing they can do; recently checked by [name]- DrJones w/ patch dressing & support hose; edema decr w/ Lasix 80mg /d; Lab today shows Creat=1.5.Marland KitchenMarland Kitchen  HYPERCHOLESTEROLEMIA (ICD-272.0) - prev Rx'd by [name]... Now on ZOCOR 20mg /d (prev Crestor5 & Lescol from [name]) ~  she brought labs from [facility] 5/10 (off med due to $$$) w/ TChol 285, TG 204, HDL 69, LDL 195... "he was mad at me" ~  FLP 5/11 from [facility] showed TChol 202, TG 60, HDL 112, LDL 99 ~  She is reminded (again) to come FASTING for f/u FLP... ~  FLP 5/13 on Simva20 showed TChol 191, TG 81, HDL 82, LDL 93  HYPOTHYROIDISM (ICD-244.9) - prev followed by [name] on SYNTHROID - 1/2 daily... ~  pt brought labs from [facility] 5/10 w/ TSH= 2.90, FreeT4= 1.09 ~  labs 8/11 showed TSH= 1.87 ~  Labs 3/12 showed TSH= 2.81 ~  Labs 1/13 showed TSH= 1.65 ~  Labs 3/14 showed TSH= 4.28  GERD (ICD-530.81) - uses PRILOSEC 20mg /d... last EGD was 6/06 by DrPatterson showing 3cmHH, reflux...   DIVERTICULOSIS OF COLON (ICD-562.10) & COLONIC POLYPS (ICD-211.3) - last colonoscopy 6/06 was WNL- no abnormality seen...  RENAL INSUFFICIENCY (ICD-588.9) - tough balance betw renal perfusion & diuresis for edema... ~  labs 10/08 showed BUN= 27, Creat= 1.5 ~  labs 8/09 showed BUN= 44, Creat= 1.9 ~  labs 2/10 showed BUN 46, Creat= 1.7 ~  labs 7/10 showed BUN= 54, Creat= 2.0 ~  labs 11/10 showed BUN= 38, Creat= 1.8 ~  labs  8/11 showed BUN= 42, Creat= 2.1 ~  labs in ER 1/12 showed BUN=56, Creat=2.67, rec decr diuretics to Demadex20mg /d only. ~  Labs 3/12 showed improved renal function w/ BUN=31, Creat=1.4... ~  Labs 5/12 on Lasix80 showed BUN=45, Creat=1.8 ~  Labs 9/12 by  [name] showed BUN=33, Creat=1.47 ~  Labs 1/13 showed BUN=26, Creat=1.3, BNP=50... On Lasix 40mg  Qam. ~  Labs 5/13 showed BUN=28, Creat=1.2 ~  Labs 12/13 on Lasix80 showed BUN=30, Creat=1.5 ~  Labs 3/14 on Lasix80 showed BUN=31, Creat=1.5  DEGENERATIVE JOINT DISEASE (ICD-715.90) - this is her chief complaint- s/p right TKR in 1999,  left TKR 3/10... on MOBIC 7.5mg  Prn & VICODIN Prn as well... followed by [name]; and had right second toe amp by Volney Presser 2010 for hammertoe + spurs... also had epidural steroid shot from [facility] for LBP (without benefit she says)... ~  5/13:  She saw [name] for Rheum w/ shot in hip & sl improved...  VITAMIN D DEFICIENCY (ICD-268.9) ~  labs 8/09 showed Vit D level = 12... rec- start Vit D 50,000 u weekly... ~  labs 2/10 showed Vit D level = 30... rec- continue Vit D 50K weekly... ~  labs 7/10 showed Vit D level = 39... rec- change to 1000 u OTC daily. ~  labs 8/11 showed Vit D level = 38... continue same. ~  Labs 3/14 showed Vit D level = 43... Continue 1000u daily.  ANXIETY (ICD-300.00) - on ALPRAZOLAM 0.25mg Tid Prn... several deaths in the family... daughter who lives w/ her recently ill...  SHINGLES - she had right T9-10 shingles in 2011 & resolved w/ Valtrex, Pred, etc...   Past Surgical History  Procedure Laterality Date  . Cataract extraction    . Abdominal hysterectomy    . Breast biopsy      Benign  . Total knee arthroplasty  1999    right  . Total knee arthroplasty  11/2008    left - Dr Ophelia Charter  . Toe amputation  08/2009    Right second toe - Dr Lestine Box    Outpatient Encounter Prescriptions as of 12/21/2012  Medication Sig Dispense Refill  . acetaminophen (TYLENOL) 325 MG tablet Per bottle as needed      . allopurinol (ZYLOPRIM) 100 MG tablet Take 100 mg by mouth daily.        [name] ALPRAZolam (XANAX) 0.25 MG tablet TAKE 1/2 TO 1 TABLET BY MOUTH 3 TIMES A DAY AS NEEDED  90 tablet  1  . Ascorbic Acid (VITAMIN C) 500 MG tablet Take 500  mg by mouth daily.        [name] aspirin 81 MG tablet Take 81 mg by mouth daily.        . Cholecalciferol (VITAMIN D) 1000 UNITS capsule Take 1,000 Units by mouth daily.        [name] diltiazem (CARDIZEM CD) 180 MG 24 hr capsule TAKE 1 CAPSULE DAILY  30 capsule  5  . fluorometholone (FML) 0.1 % ophthalmic suspension One drop in right eye at bedtime       . furosemide (LASIX) 40 MG tablet Take 1-2 tablets by mouth daily  60 tablet  11  . furosemide (LASIX) 40 MG tablet TAKE 2 TABLETS BY MOUTH ONCE EVERY MORNING  60 tablet  5  . guaiFENesin (MUCINEX) 600 MG 12 hr tablet Take 1,200 mg by mouth 2 (two) times daily as needed.       [name] HYDROcodone-acetaminophen (VICODIN) 5-500 MG per tablet Take 1/2 to 1 By mouth three times  a day as needed for pain  90 tablet  5  . levothyroxine (SYNTHROID, LEVOTHROID) 125 MCG tablet Take 1/2 tablet By mouth once daily  90 tablet  3  . meclizine (ANTIVERT) 25 MG tablet Take 1 tablet (25 mg total) by mouth every 4 (four) hours as needed for dizziness.  30 tablet  4  . omeprazole (PRILOSEC) 20 MG capsule Take 20 mg by mouth daily.        Bertram Gala Glycol-Propyl Glycol (SYSTANE) 0.4-0.3 % SOLN One drop in each eye two times a day       . simvastatin (ZOCOR) 20 MG tablet TAKE 1 TAB BY MOUTH AT BEDTIME  90 tablet  3  . timolol (TIMOPTIC) 0.5 % ophthalmic solution Place 1 drop into both eyes 2 (two) times daily.        [name] triamcinolone (KENALOG) 0.025 % cream Apply 1 application topically Twice daily.       No facility-administered encounter medications on file as of 12/21/2012.    Allergies  Allergen Reactions  . Enablex (Darifenacin Hydrobromide Er)     Caused her throat and mouth to have bumps and feel like it was swelling  . Clarithromycin     REACTION: causes her mouth to swell  . Codeine     Makes pt nervous  . Morphine     REACTION: n/v  . Sulfonamide Derivatives     REACTION: unsure of reaction    Current Medications, Allergies, Past Medical History, Past  Surgical History, Family History, and Social History were reviewed in [employer] record.    Review of Systems        See HPI - all other systems neg except as noted...  The patient complains of dyspnea on exertion, peripheral edema, muscle weakness, and difficulty walking.  The patient denies anorexia, fever, weight loss, weight gain, vision loss, decreased hearing, hoarseness, chest pain, syncope, prolonged cough, headaches, hemoptysis, abdominal pain, melena, hematochezia, severe indigestion/heartburn, hematuria, incontinence, genital sores, suspicious skin lesions, transient blindness, depression, unusual weight change, abnormal bleeding, enlarged lymph nodes, and angioedema.     Objective:   Physical Exam      WD, WN, 79 y/o WF in NAD... GENERAL:  Alert & oriented; pleasant & cooperative... HEENT:  Carbon/AT, EOM- full, EACs-clear, TMs-wnl, NOSE-clear, THROAT-clear & wnl. NECK:  Supple w/ fairROM; no JVD; normal carotid impulses w/o bruits; no thyromegaly or nodules palpated; no lymphadenopathy. CHEST:  Clear, decr BS bilat w/o wheezing, rales, or rhonchi heard... HEART:  Regular Rhythm;  gr 1/6 SEM without rubs or gallops detected... ABDOMEN:  Soft & nontender; normal bowel sounds; no organomegaly or masses detected. EXT:  moderate arthritic changes; s/p bilat TKRs; mild varicose veins/ +venous insuffic/ 2+edema R>L s/p right second toe distal amputation... NEURO:  CN's intact;  no focal neuro deficits... DERM:  No lesions noted; no rash etc...  RADIOLOGY DATA:  Reviewed in the EPIC EMR & discussed w/ the patient...  LABORATORY DATA:  Reviewed in the EPIC EMR & discussed w/ the patient...   Assessment & Plan:    HBP>  Controlled on meds, continue same including the Lasix to 40mg  Qam...  Cardiac>  DrRoss' note reviewed; see 2DEcho, Event Monitor reports no dangerous arrhythmias, continue meds...  Ven Insuffic/ EDEMA- improved>  evals by Cards & [name]> "there  is nothing they can do"; continue elevation, no salt, Lasix80 now, compression...  CHOL>  On Simva20 & FLP 5/13 looked good...  HYPOTHYROID>  Continue Synthroid 143mcg/d  taking 1/2 tab per [name]...  GI>  Stable & followed by DrPatterson...  Renal Insuffic>  Careful w/ diuresis, NSAIDs etc; Creat improved to 1.3 on lower dose of Lasix.  DJD>  She saw [name] for her Rheum complaints==> shot in knee; [name]/ Alvester Morin have signed off; may need pain clinic....  Other problems as noted...   Patient's Medications  New Prescriptions   No medications on file  Previous Medications   ACETAMINOPHEN (TYLENOL) 325 MG TABLET    Per bottle as needed   ALLOPURINOL (ZYLOPRIM) 100 MG TABLET    Take 100 mg by mouth daily.     ASCORBIC ACID (VITAMIN C) 500 MG TABLET    Take 500 mg by mouth daily.     ASPIRIN 81 MG TABLET    Take 81 mg by mouth daily.     CHOLECALCIFEROL (VITAMIN D) 1000 UNITS CAPSULE    Take 1,000 Units by mouth daily.     DILTIAZEM (CARDIZEM CD) 180 MG 24 HR CAPSULE    TAKE 1 CAPSULE DAILY   FUROSEMIDE (LASIX) 40 MG TABLET    TAKE 2 TABLETS BY MOUTH ONCE EVERY MORNING   GUAIFENESIN (MUCINEX) 600 MG 12 HR TABLET    Take 1,200 mg by mouth 2 (two) times daily as needed.    HYDROCODONE-ACETAMINOPHEN (VICODIN) 5-500 MG PER TABLET    Take 1/2 to 1 By mouth three times a day as needed for pain   LEVOTHYROXINE (SYNTHROID, LEVOTHROID) 125 MCG TABLET    Take 1/2 tablet By mouth once daily   MECLIZINE (ANTIVERT) 25 MG TABLET    Take 1 tablet (25 mg total) by mouth every 4 (four) hours as needed for dizziness.   OMEPRAZOLE (PRILOSEC) 20 MG CAPSULE    Take 20 mg by mouth daily.     POLYETHYL GLYCOL-PROPYL GLYCOL (SYSTANE) 0.4-0.3 % SOLN    One drop in each eye two times a day    SIMVASTATIN (ZOCOR) 20 MG TABLET    TAKE 1 TAB BY MOUTH AT BEDTIME   TIMOLOL (TIMOPTIC) 0.5 % OPHTHALMIC SOLUTION    Place 1 drop into both eyes 2 (two) times daily.     TRIAMCINOLONE (KENALOG) 0.025 % CREAM    Apply  1 application topically Twice daily.  Modified Medications   Modified Medication Previous Medication   ALPRAZOLAM (XANAX) 0.25 MG TABLET ALPRAZolam (XANAX) 0.25 MG tablet      Take 1/2 to 1 tablet by mouth 3 times daily as needed    TAKE 1/2 TO 1 TABLET BY MOUTH 3 TIMES A DAY AS NEEDED  Discontinued Medications   FLUOROMETHOLONE (FML) 0.1 % OPHTHALMIC SUSPENSION    One drop in right eye at bedtime    FUROSEMIDE (LASIX) 40 MG TABLET    Take 1-2 tablets by mouth daily

## 2012-12-28 ENCOUNTER — Encounter: Payer: Self-pay | Admitting: Vascular Surgery

## 2012-12-29 ENCOUNTER — Encounter: Payer: Self-pay | Admitting: Vascular Surgery

## 2012-12-29 ENCOUNTER — Ambulatory Visit (INDEPENDENT_AMBULATORY_CARE_PROVIDER_SITE_OTHER): Payer: Medicare Other | Admitting: Vascular Surgery

## 2012-12-29 ENCOUNTER — Encounter (INDEPENDENT_AMBULATORY_CARE_PROVIDER_SITE_OTHER): Payer: Medicare Other | Admitting: Vascular Surgery

## 2012-12-29 VITALS — BP 151/63 | HR 83 | Ht 63.0 in | Wt 150.0 lb

## 2012-12-29 DIAGNOSIS — I83893 Varicose veins of bilateral lower extremities with other complications: Secondary | ICD-10-CM | POA: Diagnosis not present

## 2012-12-29 DIAGNOSIS — M7989 Other specified soft tissue disorders: Secondary | ICD-10-CM | POA: Diagnosis not present

## 2012-12-29 DIAGNOSIS — I872 Venous insufficiency (chronic) (peripheral): Secondary | ICD-10-CM

## 2012-12-29 DIAGNOSIS — M79609 Pain in unspecified limb: Secondary | ICD-10-CM

## 2012-12-29 NOTE — Progress Notes (Signed)
[facility] [place]  Established Venous Insufficiency  History of Present Illness  [name] is a 79 y.o. (1915-02-17) female who presents with chief complaint: BILATERAL RE CURRANT lower leg swelling.  The patient's symptoms have  Progressed over the last 3-4 weeks.  The patient's symptoms are: swelling with pain and erythema left leg greater than right.   The patient's treatment regimen currently included: maximal medical management and thigh high compression hose daily with elevation at rest.  Past Medical History, Past Surgical History, Social History, Family History, Medications, Allergies, and Review of Systems are unchanged from previous evaluation on 01-15-2013.  Physical Examination  Filed Vitals:   12/29/12 1130  BP: 151/63  Pulse: 83  Height: 5\' 3"  (1.6 m)  Weight: 150 lb (68.04 kg)  SpO2: 99%   Body mass index is 26.58 kg/(m^2).  General: A&O x 3, WDWN and does not appear stated age of 79 y.o.  Pulmonary: Sym exp, good air movt, CTAB, no rales.  Cardiac: RRR, Nl S1, S2, no Murmurs, rubs.  Vascular: Vessel Right Left  Radial Palpable Palpable          Carotid Palpable, without bruit Palpable, without bruit  Aorta Not palpable N/A  Femoral Palpable Palpable  Popliteal Not palpable Not palpable  PT  Palpable  Palpable  DP  Palpable  Palpable   Musculoskeletal: M/S 5/5 throughout, Extremities without ischemic changes.  Right lower leg brawny discoloration of chronic venous insufficiency.  The left lower extremity has erythema and edema without warmth to touch. neurogic: Pain and light touch intact in extremities Motor exam as listed above  Non-Invasive Vascular Imaging  BLE Venous Insufficiency (Date: 12-29-2012)  RLE: Greater saphenous  LLE:  minimal reflux on the left reflux  Medical Decision Making  [name] is a 79 y.o. female who presents with: bilateral leg chronic venous insufficiency  Based on the patient's  vascular studies and examination, I have offered the patient: continue elevation hen at rest and to continue her compression hose use daily.  I discussed with the patient the use of her 20-30 mm thigh high compression stockings and need for 3 month trial of such.  She will follow up prn  Thank you for allowing [place] to participate in this patient's care.  Thomasena Edis Reis [facility]  [facility] [place] Office: [phone] Pager: [phone]  12/29/2012, 12:24 PM     I have examined the patient, reviewed and agree with above. Very pleasant lady who had seen before with similar discomfort. She is now had progressive swelling more so on the left than on the right. Duplex today is no significant change since prior exam. She does have reflux in her right great saphenous vein and also some deep venous reflux. I have recommended against any aggressive treatment such as ablation of her great saphenous vein was particularly in light that her left leg his most symptomatic in no correctable reflux is noted. She is somewhat frustrated in her inability to control the swelling. She has increased her Lasix. She understands the critical importance of compression which she is very compliant with and elevation. She will see [place] again on an as-needed basis EARLY, TODD, MD 12/29/2012 12:56 PM

## 2013-01-06 DIAGNOSIS — H409 Unspecified glaucoma: Secondary | ICD-10-CM | POA: Diagnosis not present

## 2013-01-06 DIAGNOSIS — H4089 Other specified glaucoma: Secondary | ICD-10-CM | POA: Diagnosis not present

## 2013-01-06 DIAGNOSIS — H04129 Dry eye syndrome of unspecified lacrimal gland: Secondary | ICD-10-CM | POA: Diagnosis not present

## 2013-01-19 DIAGNOSIS — M25559 Pain in unspecified hip: Secondary | ICD-10-CM | POA: Diagnosis not present

## 2013-02-25 DIAGNOSIS — M1A00X1 Idiopathic chronic gout, unspecified site, with tophus (tophi): Secondary | ICD-10-CM | POA: Diagnosis not present

## 2013-02-25 DIAGNOSIS — M169 Osteoarthritis of hip, unspecified: Secondary | ICD-10-CM | POA: Diagnosis not present

## 2013-02-25 DIAGNOSIS — M19049 Primary osteoarthritis, unspecified hand: Secondary | ICD-10-CM | POA: Diagnosis not present

## 2013-02-25 DIAGNOSIS — M2550 Pain in unspecified joint: Secondary | ICD-10-CM | POA: Diagnosis not present

## 2013-02-25 DIAGNOSIS — Z79899 Other long term (current) drug therapy: Secondary | ICD-10-CM | POA: Diagnosis not present

## 2013-02-25 DIAGNOSIS — M25549 Pain in joints of unspecified hand: Secondary | ICD-10-CM | POA: Diagnosis not present

## 2013-03-16 DIAGNOSIS — H04129 Dry eye syndrome of unspecified lacrimal gland: Secondary | ICD-10-CM | POA: Diagnosis not present

## 2013-03-29 DIAGNOSIS — R21 Rash and other nonspecific skin eruption: Secondary | ICD-10-CM | POA: Diagnosis not present

## 2013-03-29 DIAGNOSIS — I8310 Varicose veins of unspecified lower extremity with inflammation: Secondary | ICD-10-CM | POA: Diagnosis not present

## 2013-03-29 DIAGNOSIS — D485 Neoplasm of uncertain behavior of skin: Secondary | ICD-10-CM | POA: Diagnosis not present

## 2013-03-29 DIAGNOSIS — Z85828 Personal history of other malignant neoplasm of skin: Secondary | ICD-10-CM | POA: Diagnosis not present

## 2013-03-29 DIAGNOSIS — B079 Viral wart, unspecified: Secondary | ICD-10-CM | POA: Diagnosis not present

## 2013-03-30 ENCOUNTER — Other Ambulatory Visit: Payer: Self-pay | Admitting: Pulmonary Disease

## 2013-04-13 DIAGNOSIS — M25559 Pain in unspecified hip: Secondary | ICD-10-CM | POA: Diagnosis not present

## 2013-04-21 ENCOUNTER — Ambulatory Visit (INDEPENDENT_AMBULATORY_CARE_PROVIDER_SITE_OTHER): Payer: Medicare Other | Admitting: Pulmonary Disease

## 2013-04-21 ENCOUNTER — Encounter: Payer: Self-pay | Admitting: Pulmonary Disease

## 2013-04-21 VITALS — BP 150/64 | HR 86 | Temp 98.6°F | Ht 60.0 in | Wt 140.6 lb

## 2013-04-21 DIAGNOSIS — K219 Gastro-esophageal reflux disease without esophagitis: Secondary | ICD-10-CM

## 2013-04-21 DIAGNOSIS — N259 Disorder resulting from impaired renal tubular function, unspecified: Secondary | ICD-10-CM

## 2013-04-21 DIAGNOSIS — E559 Vitamin D deficiency, unspecified: Secondary | ICD-10-CM

## 2013-04-21 DIAGNOSIS — K5730 Diverticulosis of large intestine without perforation or abscess without bleeding: Secondary | ICD-10-CM

## 2013-04-21 DIAGNOSIS — E039 Hypothyroidism, unspecified: Secondary | ICD-10-CM

## 2013-04-21 DIAGNOSIS — E7800 Pure hypercholesterolemia, unspecified: Secondary | ICD-10-CM

## 2013-04-21 DIAGNOSIS — I872 Venous insufficiency (chronic) (peripheral): Secondary | ICD-10-CM | POA: Diagnosis not present

## 2013-04-21 DIAGNOSIS — M1990 Unspecified osteoarthritis, unspecified site: Secondary | ICD-10-CM

## 2013-04-21 DIAGNOSIS — F411 Generalized anxiety disorder: Secondary | ICD-10-CM

## 2013-04-21 DIAGNOSIS — M109 Gout, unspecified: Secondary | ICD-10-CM | POA: Insufficient documentation

## 2013-04-21 DIAGNOSIS — I10 Essential (primary) hypertension: Secondary | ICD-10-CM

## 2013-04-21 NOTE — Progress Notes (Signed)
Subjective:    Patient ID: [name], female    DOB: July 09, 1915, 79 y.o.   MRN: [id number]  HPI 79 y/o WF here for a follow up visit... she has mult med problems including:  Hx asthmatic bronchitis;  HBP;  Ven Insuffic & edema;  Hyperchol;  Hypothyroid;  GERD/ Divertics/ Colon polyps;  Renal insuffic;  DJD;  Vit D defic;  Anxiety...   ~  October 15, 2011:  29mo ROV & [name] continues to lose some wt- down 7# further to 133# today, appetite is ok, eating fair & encouraged to add supplements like Ensure vs Boost etc at least Bid;  Meds reviewed & unchanged;  She had f/u [name] 12/12 for Rheum- OA, Gout, bursitis, renal insuffic makes avoiding NSAIDs imperitive & rec to try Aspercreme...    Her breathing is stable w/o interval resp exac...    BP is stable on Diltiazem & Lasix, but reads 160/76 today (hasn't taken meds yet); we reviewed diet, no salt, etc...    FLP controlled on Simva20 but she hasn't remembered to come fasting for recheck FLP; continue diet, exercise & the same med for now...    She is c/o urinary symptoms and we discussed trial of ENABLEX 7.5mg  to see if this helps; mild renal insuffic is improved on ntodays labs w/ Creat=1.3  ~  May 22,2013:  29mo ROV & [name] is feeling well, no new complaints or concerns;  Her main prob= arthritis esp hands & bilat hips- she is followed by [name] & seen recently w/ shot in painful right hip; prev had ESI in back from [name]; prev Uric= 6.0 on Allopurinol, Colchicine, Vicodin... We reviewed her prob list, meds, xrays and labs> see below>> LABS 5/13:  FLP- at goals w/ good HDL on Simva20;  Chems- wnl;  CBC- ok w/ Hg=12.4.[name].  ~  June 17, 2012:  29mo ROV & she remains stable> had some vertigo- improved w/ Antivert prn; she is c/o some incr pain in buttock, right hip & down right leg; hx prev ESI from [name] in past- she has Vicodin for prn use & it helps some; she wants to wait for f/u appt [name] before deciding what to  do...  Notes that her breathing is OK "I'm on the go all the time";  BP= 140/60 & denies CP, palpit, ch in SOB or edema...     We reviewed prob list, meds, xrays and labs> see below for updates >> OK Flu shot today...  ~  September 21, 2012:  68mo ROV & [name] saw [name] 11/13 w/ c/o VI & incr edema> both legs were red, warm, tender- but she had no f/c/s or drainage; she had had foot surg w/ decr mobility & was on reg dose of Lasix40;  VenDopplers were neg for DVT & she was treated w/ DoxyBid for 10d- no improvement; she went to see Derm- [name]/ [name] & they changed her support hose "he found an ulcer" she says & treating w/ [employer] brand "hydrocolloid adhesive patch" Q2d; we also incr the Lasix to 80mg /d for the last month & she reports improved w/ decr redness, no drainage, decr edema "I can now fit into my shoes" she says...     AB> stable, no recent exac, no regular meds required & she uses the Mucinex as needed...    HBP> controlled on Diltiazem180 & Furosemide 40mg -2Qam w/ BP=138/62 today & she denies CP, palpit, dizzy, ch inSOB, etc...    VI, Edema> she knows  to elim sodium, elevate, wear support hose; she has been seen by [name], [name] & there is nothing they can do; recently checked by [name]- DrJones w/ patch dressing & support hose; edema decr w/ Lasix 80mg /d; Lab today shows Creat=1.5.Marland KitchenMarland Kitchen    CHOL> prev followed by [name]; on Simva20 Qhs + low fat diet; last FLP 5/13 showed TChol 191, TG 81, HDL 82, LDL 93    Hypothy> stable on Synthroid - 1/2 tab daily; labs 1/13 showed TSH= 1.65     Renal Insuffic> Creat had prev increased to 1.8 on the Lasix 80mg ; repeat labs 9/12 by [name] showed BUN=33, Creat=1.47, assoc w/ resolved edema therefore decr Lasix to 40mg - 1to2 Qam; Lab today shows BUN=30, Creat=1.5 on the Lasix80/d at present...    DJD> followed by [name] on Vicodin prn, Allopurinol100- now 1.5tabs/d (Uric=5.9), & off Colcrys...     Anxiety> on Alpraz0.25mg  prn... 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 LABS 12/13:  Chems= ok w/ BUN=30, Creat=1.5.[name].  ~  December 21, 2012:  99mo ROV & [name] indicates that she just had eye surg at [facility] (Hx corneal Tx yrs ago & the shield around it had an infection);  she has been on Lasix40-2tabsQam for the last 99mo & her wt is up 3# to 151# today;  BP is controlled w/ this & CardizemCD180, BP= 130/66;  Extremities show 2-3+ pitting in legs but chest is clear etc;  There is not a lot of wiggle room on her diuretics betw edema & renal insuffic- we decided to check labs & continue same Rx for now...    We reviewed prob list, meds, xrays and labs> see below for updates >>  LABS 3/14:  Chems stable w/ BUN=31, Creat=1.5, LFTs=wnl;  CBC- wnl;  TSH=4.28;  VitD=43... rec to continue same meds.  ~  April 21, 2013:  45mo ROV  & [name] is actually much improved> weight down 10# w/ resolution of her edema & feeling better overall; c/o incr nasal congestion & drainage- we discussed OTC antihist Rx... We reviewed the following medical problems during today's office visit >>     Ophthalmology> followed at [facility] for Glaucoma & hx corneal transplant w/ infection; she is on some new drops & doing satis she says...    AB> stable, no recent exac, no regular meds required & she uses the Mucinex as needed; she is c/o a runny nose & we discussed Antihist- Claritin, Zyrtek, Allegra as needed...    HBP> on Diltiazem180 & Furosemide 40mg -2Qam w/ BP=150/64 today & she denies CP, palpit, dizzy, ch in SOB, etc...    VI, Hx Edema> she knows to elim sodium, elevate, wear support hose, & the Lasix80; weight is down 10# today to 141# w/ resolution of edema; [name] (Derm) treated dermatitis w/ new cream & improved...     CHOL> prev followed by [name]; on Simva20 Qhs + low fat diet; last FLP 5/13 showed TChol 191, TG 81, HDL 82, LDL 93    Hypothy> on Synthroid - 1/2 tab daily; labs  3/14 showed TSH= 4.28    Renal Insuffic> Creat had prev up to 1.8 on the Lasix 80mg ; Lab 1/14 showed BUN=30, Creat=1.5 on the Lasix80/d; repeat labs 6/14 by [name] showed Cr=1.3    DJD, Gout> followed by [name] on Vicodin prn, ?Allopurinol200- 1.5tabs/d (labs 6/14 Uric=4.5) per Rheum, & off Colcrys...    Anxiety> on Alpraz0.25mg  prn... We  reviewed prob list, meds, xrays and labs> see below for updates >>  LABS 6/14 by [name]:  Chems- wnl x Cr=1.3;  CBC- wnl w/ Hg=12.5;  Uric=4.5.Marland KitchenMarland Kitchen           Problem List:  GLAUCOMA (ICD-365.9) - hx mult eye problems w/ prev corneal transplant & glaucoma laser eye surg... on eye drops per Ophthalmology at [facility]...  ASTHMATIC BRONCHITIS, ACUTE (ICD-466.0) - Hx AB requiring Rx w/ Depo/ Pred/ Mucinex/ Tussionex... ~  CXR 4/11 showed vague nodular densities on left... ~  8/11:  she went to an [facility] w/ cough, sputum, & dx w/ pneumonia rx w/ ZPak... ~  CXR 3/12 showed borderline cardiomeg, nodular opac left base, NAD... ~  Spring 2013: she notes some allergy symptoms...  HYPERTENSION (ICD-401.9) - controlled on ASA 81mg /d,  CARTIAXT 180mg /d, LASIX 40mg -  1-2Qam... ~  7/10:  labs showed BUN=54, Creat=2.0 & rec to decr diuretics in half= one Demedex & 1/2 Aldactone. ~  11/10:  labs showed BUN=38, Creat=1.8 & rec to keep same meds. ~  8/11:  labs showed BUN=42, Creat=2.1, K=4.3 ~  1/12:  labs in ER showed BUN=56, Creat=2.67, therefore diuretics decr to Demadex20/d only. ~  3/12:  2DEcho showed mod LVH, norm sys function w/ EF=60-65% & no regional wall motion abn; Gr1DD, mibile density on AoV- prob lambl'e escrescence, mildMR. ~  5/12:  f/u labs in office showed BUN=45, Creat=1.8, on Lasix 80mg  Qam for her edema... ~  9/12:  Labs by [name] 9/12 showed BUN=33, Creat=1.47, edema resolved therefore decr Lasix to 40mg /d. ~  1/13:  BP= 160/76 but hasn't taken meds today; labs improved w/ BUN=26, Creat=1.3, BNP=50 ~  5/13:  BP= 140/78 & denies  CP, palpit, SOB, edema; labs improved w/ BUN=28, Creat=1.2 ~  9/13:  BP= 140/60 & she denies CP, palpt, ch in SOB or edema... ~  12/13: controlled on Diltiazem180 & Furosemide 40mg -2Qam w/ BP=138/62 today & she denies CP, palpit, dizzy, ch inSOB, etc... ~  3/14:  BP= 130/66 on same meds; Labs stable as well w/ Cr=1.5, continue same... ~  7/14:  on Diltiazem180 & Furosemide 40mg -2Qam w/ BP=150/64 today & she denies CP, palpit, dizzy, ch in SOB, etc.  PALPITATIONS, HX OF (ICD-V12.50)  VENOUS INSUFFICIENCY (ICD-459.81) - swelling controlled w/ diuretics, low sodium diet, elevation, support hose, etc... ~  She saw [name] [name]- there was nothing he could do... ~  Swelling diminished w/ low sodium, elevation, support hose, & Lasix40 1-2tabs daily... ~  Nov-Dec2013: she knows to elim sodium, elevate, wear support hose; she has been seen by [name], [name] & there is nothing they can do; recently checked by [name]- DrJones w/ patch dressing & support hose; edema decr w/ Lasix 80mg /d; Lab today shows Creat=1.5.[name]. ~  7/14:  she knows to elim sodium, elevate, wear support hose, & the Lasix80; weight is down 10# today to 141# w/ resolution of edema; [name] (Derm) treated dermatitis w/ new cream & improved.  HYPERCHOLESTEROLEMIA (ICD-272.0) - prev Rx'd by [name]... Now on ZOCOR 20mg /d (prev Crestor5 & Lescol from [facility]) ~  she brought labs from [facility] 5/10 (off med due to $$$) w/ TChol 285, TG 204, HDL 69, LDL 195... "he was mad at me" ~  FLP 5/11 from [facility] showed TChol 202, TG 60, HDL 112, LDL 99 ~  She is reminded (again) to come FASTING for f/u FLP... ~  FLP 5/13 on Simva20 showed TChol 191, TG 81, HDL 82, LDL 93  HYPOTHYROIDISM (ICD-244.9) -  prev followed by [name] on SYNTHROID - 1/2 daily... ~  pt brought labs from [facility] 5/10 w/ TSH= 2.90, FreeT4= 1.09 ~  labs 8/11 showed TSH= 1.87 ~  Labs 3/12 showed TSH= 2.81 ~  Labs 1/13 showed TSH= 1.65 ~  Labs 3/14 showed TSH= 4.28  GERD  (ICD-530.81) - uses PRILOSEC 20mg /d... last EGD was 6/06 by DrPatterson showing 3cmHH, reflux...   DIVERTICULOSIS OF COLON (ICD-562.10) & COLONIC POLYPS (ICD-211.3) - last colonoscopy 6/06 was WNL- no abnormality seen...  RENAL INSUFFICIENCY (ICD-588.9) - tough balance betw renal perfusion & diuresis for edema... ~  labs 10/08 showed BUN= 27, Creat= 1.5 ~  labs 8/09 showed BUN= 44, Creat= 1.9 ~  labs 2/10 showed BUN 46, Creat= 1.7 ~  labs 7/10 showed BUN= 54, Creat= 2.0 ~  labs 11/10 showed BUN= 38, Creat= 1.8 ~  labs 8/11 showed BUN= 42, Creat= 2.1 ~  labs in ER 1/12 showed BUN=56, Creat=2.67, rec decr diuretics to Demadex20mg /d only. ~  Labs 3/12 showed improved renal function w/ BUN=31, Creat=1.4... ~  Labs 5/12 on Lasix80 showed BUN=45, Creat=1.8 ~  Labs 9/12 by [name] showed BUN=33, Creat=1.47 ~  Labs 1/13 showed BUN=26, Creat=1.3, BNP=50... On Lasix 40mg  Qam. ~  Labs 5/13 showed BUN=28, Creat=1.2 ~  Labs 12/13 on Lasix80 showed BUN=30, Creat=1.5 ~  Labs 3/14 on Lasix80 showed BUN=31, Creat=1.5 ~  Labs 6/14 by [name] on Lasix80 showed BUN=30, Cr=1.3  DEGENERATIVE JOINT DISEASE (ICD-715.90) - this is her chief complaint- s/p right TKR in 1999,  left TKR 3/10... on MOBIC 7.5mg  Prn & VICODIN Prn as well... followed by [name]; and had right second toe amp by Volney Presser 2010 for hammertoe + spurs... also had epidural steroid shot from [facility] for LBP (without benefit she says)... ~  5/13:  She saw [name] for Rheum w/ shot in hip & sl improved...  VITAMIN D DEFICIENCY (ICD-268.9) ~  labs 8/09 showed Vit D level = 12... rec- start Vit D 50,000 u weekly... ~  labs 2/10 showed Vit D level = 30... rec- continue Vit D 50K weekly... ~  labs 7/10 showed Vit D level = 39... rec- change to 1000 u OTC daily. ~  labs 8/11 showed Vit D level = 38... continue same. ~  Labs 3/14 showed Vit D level = 43... Continue 1000u daily.  ANXIETY (ICD-300.00) - on ALPRAZOLAM 0.25mg Tid Prn...  several deaths in the family... daughter who lives w/ her recently ill...  SHINGLES - she had right T9-10 shingles in 2011 & resolved w/ Valtrex, Pred, etc...   Past Surgical History  Procedure Laterality Date  . Cataract extraction    . Abdominal hysterectomy    . Breast biopsy      Benign  . Total knee arthroplasty  1999    right  . Total knee arthroplasty  11/2008    left - Dr Ophelia Charter  . Toe amputation  08/2009    Right second toe - Dr Lestine Box    Outpatient Encounter Prescriptions as of 04/21/2013  Medication Sig Dispense Refill  . acetaminophen (TYLENOL) 325 MG tablet Per bottle as needed      . allopurinol (ZYLOPRIM) 100 MG tablet Take 100 mg by mouth daily.        [name] ALPRAZolam (XANAX) 0.25 MG tablet Take 1/2 to 1 tablet by mouth 3 times daily as needed  90 tablet  5  . Ascorbic Acid (VITAMIN C) 500 MG tablet Take 500 mg by mouth daily.        [name]  aspirin 81 MG tablet Take 81 mg by mouth daily.        . Cholecalciferol (VITAMIN D) 1000 UNITS capsule Take 1,000 Units by mouth daily.        [name] diltiazem (CARDIZEM CD) 180 MG 24 hr capsule TAKE 1 CAPSULE DAILY  30 capsule  5  . furosemide (LASIX) 40 MG tablet TAKE 2 TABLETS BY MOUTH ONCE EVERY MORNING  60 tablet  5  . guaiFENesin (MUCINEX) 600 MG 12 hr tablet Take 1,200 mg by mouth 2 (two) times daily as needed.       [name] HYDROcodone-acetaminophen (VICODIN) 5-500 MG per tablet Take 1/2 to 1 By mouth three times a day as needed for pain  90 tablet  5  . levothyroxine (SYNTHROID, LEVOTHROID) 125 MCG tablet Take 1/2 tablet By mouth once daily  90 tablet  3  . omeprazole (PRILOSEC) 20 MG capsule Take 20 mg by mouth daily.        Bertram Gala Glycol-Propyl Glycol (SYSTANE) 0.4-0.3 % SOLN One drop in each eye two times a day       . simvastatin (ZOCOR) 20 MG tablet TAKE 1 TAB BY MOUTH AT BEDTIME  90 tablet  3  . timolol (TIMOPTIC) 0.5 % ophthalmic solution Place 1 drop into both eyes 2 (two) times daily.        [name] triamcinolone (KENALOG) 0.025 %  cream Apply 1 application topically Twice daily.       No facility-administered encounter medications on file as of 04/21/2013.    Allergies  Allergen Reactions  . Enablex (Darifenacin Hydrobromide Er)     Caused her throat and mouth to have bumps and feel like it was swelling  . Clarithromycin     REACTION: causes her mouth to swell  . Codeine     Makes pt nervous  . Morphine     REACTION: n/v  . Sulfonamide Derivatives     REACTION: unsure of reaction    Current Medications, Allergies, Past Medical History, Past Surgical History, Family History, and Social History were reviewed in [employer] record.    Review of Systems        See HPI - all other systems neg except as noted...  The patient complains of dyspnea on exertion, peripheral edema, muscle weakness, and difficulty walking.  The patient denies anorexia, fever, weight loss, weight gain, vision loss, decreased hearing, hoarseness, chest pain, syncope, prolonged cough, headaches, hemoptysis, abdominal pain, melena, hematochezia, severe indigestion/heartburn, hematuria, incontinence, genital sores, suspicious skin lesions, transient blindness, depression, unusual weight change, abnormal bleeding, enlarged lymph nodes, and angioedema.     Objective:   Physical Exam      WD, WN, 79 y/o WF in NAD... GENERAL:  Alert & oriented; pleasant & cooperative... HEENT:  [place]/AT, EOM- full, EACs-clear, TMs-wnl, NOSE-clear, THROAT-clear & wnl. NECK:  Supple w/ fairROM; no JVD; normal carotid impulses w/o bruits; no thyromegaly or nodules palpated; no lymphadenopathy. CHEST:  Clear, decr BS bilat w/o wheezing, rales, or rhonchi heard... HEART:  Regular Rhythm;  gr 1/6 SEM without rubs or gallops detected... ABDOMEN:  Soft & nontender; normal bowel sounds; no organomegaly or masses detected. EXT:  moderate arthritic changes; s/p bilat TKRs; mild varicose veins/ +venous insuffic/ 2+edema R>L s/p right second toe distal  amputation... NEURO:  CN's intact;  no focal neuro deficits... DERM:  No lesions noted; no rash etc...  RADIOLOGY DATA:  Reviewed in the EPIC EMR & discussed w/ the patient...  LABORATORY DATA:  Reviewed in the [facility] EMR & discussed w/ the patient...   Assessment & Plan:    HBP>  Controlled on meds, continue same including the Lasix40- 2Qam...  Cardiac>  DrRoss' note reviewed; see 2DEcho, Event Monitor reports no dangerous arrhythmias, continue meds...  Ven Insuffic/ EDEMA- improved>  evals by Cards & [name]> "there is nothing they can do"; continue elevation, no salt, Lasix80 now, compression...  CHOL>  On Simva20 & FLP 5/13 looked good...  HYPOTHYROID>  Continue Synthroid 117mcg/d taking 1/2 tab daily...  GI>  Stable & followed by DrPatterson...  Renal Insuffic>  Careful w/ diuresis, NSAIDs etc; Creat improved to 1.3 back on her 80mg  Lasix.  DJD>  She saw [name] for her Rheum complaints==> shot in knee; [name]/ Alvester Morin have signed off; may need pain clinic....  Other problems as noted...   Patient's Medications  New Prescriptions   No medications on file  Previous Medications   ACETAMINOPHEN (TYLENOL) 325 MG TABLET    Per bottle as needed   ALLOPURINOL (ZYLOPRIM) 100 MG TABLET    Take 100 mg by mouth daily.     ALPRAZOLAM (XANAX) 0.25 MG TABLET    Take 1/2 to 1 tablet by mouth 3 times daily as needed   ASCORBIC ACID (VITAMIN C) 500 MG TABLET    Take 500 mg by mouth daily.     ASPIRIN 81 MG TABLET    Take 81 mg by mouth daily.     CHOLECALCIFEROL (VITAMIN D) 1000 UNITS CAPSULE    Take 1,000 Units by mouth daily.     DILTIAZEM (CARDIZEM CD) 180 MG 24 HR CAPSULE    TAKE 1 CAPSULE DAILY   FUROSEMIDE (LASIX) 40 MG TABLET    TAKE 2 TABLETS BY MOUTH ONCE EVERY MORNING   GUAIFENESIN (MUCINEX) 600 MG 12 HR TABLET    Take 1,200 mg by mouth 2 (two) times daily as needed.    HYDROCODONE-ACETAMINOPHEN (VICODIN) 5-500 MG PER TABLET    Take 1/2 to 1 By mouth three times a day as  needed for pain   LEVOTHYROXINE (SYNTHROID, LEVOTHROID) 125 MCG TABLET    Take 1/2 tablet By mouth once daily   OMEPRAZOLE (PRILOSEC) 20 MG CAPSULE    Take 20 mg by mouth daily.     POLYETHYL GLYCOL-PROPYL GLYCOL (SYSTANE) 0.4-0.3 % SOLN    One drop in each eye two times a day    SIMVASTATIN (ZOCOR) 20 MG TABLET    TAKE 1 TAB BY MOUTH AT BEDTIME   TIMOLOL (TIMOPTIC) 0.5 % OPHTHALMIC SOLUTION    Place 1 drop into both eyes 2 (two) times daily.     TRIAMCINOLONE (KENALOG) 0.025 % CREAM    Apply 1 application topically Twice daily.  Modified Medications   No medications on file  Discontinued Medications   No medications on file

## 2013-04-21 NOTE — Patient Instructions (Addendum)
Today we updated your med list in our EPIC system...    Continue your current medications the same...  For your runny nose>>    Try an OTC antihistamine like Claritin, Zyrtek, or Allegra (one tab daily as needed)...  Call for any questions...  Let's plan a follow up visit in 4-55mo, sooner if needed for problems.Marland KitchenMarland Kitchen

## 2013-04-28 DIAGNOSIS — Z85828 Personal history of other malignant neoplasm of skin: Secondary | ICD-10-CM | POA: Diagnosis not present

## 2013-04-28 DIAGNOSIS — I8310 Varicose veins of unspecified lower extremity with inflammation: Secondary | ICD-10-CM | POA: Diagnosis not present

## 2013-05-25 ENCOUNTER — Other Ambulatory Visit: Payer: Self-pay | Admitting: Pulmonary Disease

## 2013-05-26 DIAGNOSIS — H4011X0 Primary open-angle glaucoma, stage unspecified: Secondary | ICD-10-CM | POA: Diagnosis not present

## 2013-05-26 DIAGNOSIS — Z947 Corneal transplant status: Secondary | ICD-10-CM | POA: Diagnosis not present

## 2013-05-26 DIAGNOSIS — H409 Unspecified glaucoma: Secondary | ICD-10-CM | POA: Diagnosis not present

## 2013-05-27 ENCOUNTER — Other Ambulatory Visit: Payer: Self-pay | Admitting: Pulmonary Disease

## 2013-06-25 ENCOUNTER — Other Ambulatory Visit: Payer: Self-pay | Admitting: Pulmonary Disease

## 2013-06-28 ENCOUNTER — Other Ambulatory Visit: Payer: Self-pay | Admitting: Pulmonary Disease

## 2013-07-05 DIAGNOSIS — M5137 Other intervertebral disc degeneration, lumbosacral region: Secondary | ICD-10-CM | POA: Diagnosis not present

## 2013-07-05 DIAGNOSIS — M19049 Primary osteoarthritis, unspecified hand: Secondary | ICD-10-CM | POA: Diagnosis not present

## 2013-07-05 DIAGNOSIS — M169 Osteoarthritis of hip, unspecified: Secondary | ICD-10-CM | POA: Diagnosis not present

## 2013-07-05 DIAGNOSIS — M1710 Unilateral primary osteoarthritis, unspecified knee: Secondary | ICD-10-CM | POA: Diagnosis not present

## 2013-07-08 DIAGNOSIS — L299 Pruritus, unspecified: Secondary | ICD-10-CM | POA: Diagnosis not present

## 2013-07-08 DIAGNOSIS — Z85828 Personal history of other malignant neoplasm of skin: Secondary | ICD-10-CM | POA: Diagnosis not present

## 2013-07-08 DIAGNOSIS — L821 Other seborrheic keratosis: Secondary | ICD-10-CM | POA: Diagnosis not present

## 2013-07-08 DIAGNOSIS — L259 Unspecified contact dermatitis, unspecified cause: Secondary | ICD-10-CM | POA: Diagnosis not present

## 2013-07-12 ENCOUNTER — Other Ambulatory Visit: Payer: Self-pay | Admitting: Pulmonary Disease

## 2013-07-21 ENCOUNTER — Telehealth: Payer: Self-pay | Admitting: Pulmonary Disease

## 2013-07-21 NOTE — Telephone Encounter (Signed)
I spoke with pt. She has pending appt 08/16/13 w/ SN. She has not got flu shot yet. She wants to know if she needs to have the flu shot sooner and what dose should she get? Please advise SN thanks

## 2013-07-21 NOTE — Telephone Encounter (Signed)
Pt advised. Sumaya Riedesel, CMA  

## 2013-07-21 NOTE — Telephone Encounter (Signed)
Per SN--  Ok for flu shot at [place] or here.   Ok for her to have the regular flu vaccine.

## 2013-07-29 ENCOUNTER — Other Ambulatory Visit: Payer: Self-pay | Admitting: Pulmonary Disease

## 2013-07-29 MED ORDER — FUROSEMIDE 40 MG PO TABS: ORAL_TABLET | ORAL | Status: DC

## 2013-07-29 MED ORDER — DILTIAZEM HCL ER COATED BEADS 180 MG PO CP24: ORAL_CAPSULE | ORAL | Status: DC

## 2013-08-16 ENCOUNTER — Encounter: Payer: Self-pay | Admitting: Pulmonary Disease

## 2013-08-16 ENCOUNTER — Other Ambulatory Visit (INDEPENDENT_AMBULATORY_CARE_PROVIDER_SITE_OTHER): Payer: Medicare Other

## 2013-08-16 ENCOUNTER — Ambulatory Visit (INDEPENDENT_AMBULATORY_CARE_PROVIDER_SITE_OTHER): Payer: Medicare Other | Admitting: Pulmonary Disease

## 2013-08-16 VITALS — BP 126/64 | HR 76 | Temp 98.7°F | Ht 60.0 in | Wt 148.8 lb

## 2013-08-16 DIAGNOSIS — I872 Venous insufficiency (chronic) (peripheral): Secondary | ICD-10-CM | POA: Diagnosis not present

## 2013-08-16 DIAGNOSIS — R609 Edema, unspecified: Secondary | ICD-10-CM

## 2013-08-16 DIAGNOSIS — M109 Gout, unspecified: Secondary | ICD-10-CM

## 2013-08-16 DIAGNOSIS — E7800 Pure hypercholesterolemia, unspecified: Secondary | ICD-10-CM

## 2013-08-16 DIAGNOSIS — I10 Essential (primary) hypertension: Secondary | ICD-10-CM

## 2013-08-16 DIAGNOSIS — M1990 Unspecified osteoarthritis, unspecified site: Secondary | ICD-10-CM

## 2013-08-16 DIAGNOSIS — E039 Hypothyroidism, unspecified: Secondary | ICD-10-CM

## 2013-08-16 DIAGNOSIS — D126 Benign neoplasm of colon, unspecified: Secondary | ICD-10-CM

## 2013-08-16 DIAGNOSIS — M549 Dorsalgia, unspecified: Secondary | ICD-10-CM

## 2013-08-16 DIAGNOSIS — K5730 Diverticulosis of large intestine without perforation or abscess without bleeding: Secondary | ICD-10-CM

## 2013-08-16 DIAGNOSIS — K219 Gastro-esophageal reflux disease without esophagitis: Secondary | ICD-10-CM | POA: Diagnosis not present

## 2013-08-16 DIAGNOSIS — E559 Vitamin D deficiency, unspecified: Secondary | ICD-10-CM

## 2013-08-16 DIAGNOSIS — N259 Disorder resulting from impaired renal tubular function, unspecified: Secondary | ICD-10-CM

## 2013-08-16 DIAGNOSIS — F411 Generalized anxiety disorder: Secondary | ICD-10-CM

## 2013-08-16 LAB — BASIC METABOLIC PANEL
BUN: 31 mg/dL — ABNORMAL HIGH (ref 6–23)
CO2: 29 mEq/L (ref 19–32)
Calcium: 9.3 mg/dL (ref 8.4–10.5)
Glucose, Bld: 108 mg/dL — ABNORMAL HIGH (ref 70–99)
Potassium: 4 mEq/L (ref 3.5–5.1)
Sodium: 136 mEq/L (ref 135–145)

## 2013-08-16 LAB — CBC WITH DIFFERENTIAL/PLATELET
Basophils Relative: 0.3 % (ref 0.0–3.0)
Eosinophils Absolute: 0.1 10*3/uL (ref 0.0–0.7)
Eosinophils Relative: 1.3 % (ref 0.0–5.0)
HCT: 38.6 % (ref 36.0–46.0)
Lymphs Abs: 2 10*3/uL (ref 0.7–4.0)
MCHC: 32.8 g/dL (ref 30.0–36.0)
MCV: 93.7 fl (ref 78.0–100.0)
Monocytes Absolute: 0.5 10*3/uL (ref 0.1–1.0)
Monocytes Relative: 7.3 % (ref 3.0–12.0)
Neutro Abs: 3.8 10*3/uL (ref 1.4–7.7)
Neutrophils Relative %: 59.3 % (ref 43.0–77.0)
Platelets: 238 10*3/uL (ref 150.0–400.0)
RBC: 4.12 Mil/uL (ref 3.87–5.11)

## 2013-08-16 LAB — HEPATIC FUNCTION PANEL
Albumin: 4 g/dL (ref 3.5–5.2)
Alkaline Phosphatase: 73 U/L (ref 39–117)
Total Protein: 7.3 g/dL (ref 6.0–8.3)

## 2013-08-16 MED ORDER — DILTIAZEM HCL ER COATED BEADS 180 MG PO CP24: ORAL_CAPSULE | ORAL | Status: DC

## 2013-08-16 MED ORDER — TRAMADOL HCL 50 MG PO TABS: 50.0000 mg | ORAL_TABLET | Freq: Three times a day (TID) | ORAL | Status: DC | PRN

## 2013-08-16 NOTE — Progress Notes (Signed)
Subjective:    Patient ID: [name], female    DOB: 1915/02/16, 79 y.o.   MRN: [id number]  HPI 79 y/o WF here for a follow up visit... she has mult med problems including:  Hx asthmatic bronchitis;  HBP;  Ven Insuffic & edema;  Hyperchol;  Hypothyroid;  GERD/ Divertics/ Colon polyps;  Renal insuffic;  DJD;  Vit D defic;  Anxiety...   ~  October 15, 2011:  3278mo ROV & [name] continues to lose some wt- down 7# further to 133# today, appetite is ok, eating fair & encouraged to add supplements like Ensure vs Boost etc at least Bid;  Meds reviewed & unchanged;  She had f/u [name] 12/12 for Rheum- OA, Gout, bursitis, renal insuffic makes avoiding NSAIDs imperitive & rec to try Aspercreme...    Her breathing is stable w/o interval resp exac...    BP is stable on Diltiazem & Lasix, but reads 160/76 today (hasn't taken meds yet); we reviewed diet, no salt, etc...    FLP controlled on Simva20 but she hasn't remembered to come fasting for recheck FLP; continue diet, exercise & the same med for now...    She is c/o urinary symptoms and we discussed trial of ENABLEX 7.5mg  to see if this helps; mild renal insuffic is improved on ntodays labs w/ Creat=1.3  ~  May 22,2013:  3278mo ROV & [name] is feeling well, no new complaints or concerns;  Her main prob= arthritis esp hands & bilat hips- she is followed by [name] & seen recently w/ shot in painful right hip; prev had ESI in back from [name]; prev Uric= 6.0 on Allopurinol, Colchicine, Vicodin... We reviewed her prob list, meds, xrays and labs> see below>> LABS 5/13:  FLP- at goals w/ good HDL on Simva20;  Chems- wnl;  CBC- ok w/ Hg=12.4.[name].  ~  June 17, 2012:  3278mo ROV & she remains stable> had some vertigo- improved w/ Antivert prn; she is c/o some incr pain in buttock, right hip & down right leg; hx prev ESI from [name] in past- she has Vicodin for prn use & it helps some; she wants to wait for f/u appt [name] before deciding what to  do...  Notes that her breathing is OK "I'm on the go all the time";  BP= 140/60 & denies CP, palpit, ch in SOB or edema...     We reviewed prob list, meds, xrays and labs> see below for updates >> OK Flu shot today...  ~  September 21, 2012:  43mo ROV & [name] saw [name] 11/13 w/ c/o VI & incr edema> both legs were red, warm, tender- but she had no f/c/s or drainage; she had had foot surg w/ decr mobility & was on reg dose of Lasix40;  VenDopplers were neg for DVT & she was treated w/ DoxyBid for 10d- no improvement; she went to see Derm- [name]/ [name] & they changed her support hose "he found an ulcer" she says & treating w/ [employer] brand "hydrocolloid adhesive patch" Q2d; we also incr the Lasix to 80mg /d for the last month & she reports improved w/ decr redness, no drainage, decr edema "I can now fit into my shoes" she says...     AB> stable, no recent exac, no regular meds required & she uses the Mucinex as needed...    HBP> controlled on Diltiazem180 & Furosemide 40mg -2Qam w/ BP=138/62 today & she denies CP, palpit, dizzy, ch inSOB, etc...    VI, Edema> she knows  to elim sodium, elevate, wear support hose; she has been seen by [facility], [name] & there is nothing they can do; recently checked by [name]- DrJones w/ patch dressing & support hose; edema decr w/ Lasix 80mg /d; Lab today shows Creat=1.5.Marland KitchenMarland Kitchen    CHOL> prev followed by [name]; on Simva20 Qhs + low fat diet; last FLP 5/13 showed TChol 191, TG 81, HDL 82, LDL 93    Hypothy> stable on Synthroid - 1/2 tab daily; labs 1/13 showed TSH= 1.65     Renal Insuffic> Creat had prev increased to 1.8 on the Lasix 80mg ; repeat labs 9/12 by [name] showed BUN=33, Creat=1.47, assoc w/ resolved edema therefore decr Lasix to 40mg - 1to2 Qam; Lab today shows BUN=30, Creat=1.5 on the Lasix80/d at present...    DJD> followed by [name] on Vicodin prn, Allopurinol100- now 1.5tabs/d (Uric=5.9), & off Colcrys...     Anxiety> on Alpraz0.25mg  prn... 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 LABS 12/13:  Chems= ok w/ BUN=30, Creat=1.5.[name].  ~  December 21, 2012:  71mo ROV & [name] indicates that she just had eye surg at [facility] (Hx corneal Tx yrs ago & the shield around it had an infection);  she has been on Lasix40-2tabsQam for the last 71mo & her wt is up 3# to 151# today;  BP is controlled w/ this & CardizemCD180, BP= 130/66;  Extremities show 2-3+ pitting in legs but chest is clear etc;  There is not a lot of wiggle room on her diuretics betw edema & renal insuffic- we decided to check labs & continue same Rx for now...    We reviewed prob list, meds, xrays and labs> see below for updates >>  LABS 3/14:  Chems stable w/ BUN=31, Creat=1.5, LFTs=wnl;  CBC- wnl;  TSH=4.28;  VitD=43... rec to continue same meds.  ~  April 21, 2013:  13mo ROV  & [name] is actually much improved> weight down 10# w/ resolution of her edema & feeling better overall; c/o incr nasal congestion & drainage- we discussed OTC antihist Rx... We reviewed the following medical problems during today's office visit >>     Ophthalmology> followed at [facility] for Glaucoma & hx corneal transplant w/ infection; she is on some new drops & doing satis she says...    AB> stable, no recent exac, no regular meds required & she uses the Mucinex as needed; she is c/o a runny nose & we discussed Antihist- Claritin, Zyrtek, Allegra as needed...    HBP> on Diltiazem180 & Furosemide 40mg -2Qam w/ BP=150/64 today & she denies CP, palpit, dizzy, ch in SOB, etc...    VI, Hx Edema> she knows to elim sodium, elevate, wear support hose, & the Lasix80; weight is down 10# today to 141# w/ resolution of edema; [name] (Derm) treated dermatitis w/ new cream & improved...     CHOL> prev followed by [name]; on Simva20 Qhs + low fat diet; last FLP 5/13 showed TChol 191, TG 81, HDL 82, LDL 93    Hypothy> on Synthroid - 1/2 tab daily; labs  3/14 showed TSH= 4.28    Renal Insuffic> Creat had prev up to 1.8 on the Lasix 80mg ; Lab 1/14 showed BUN=30, Creat=1.5 on the Lasix80/d; repeat labs 6/14 by [name] showed Cr=1.3    DJD, Gout> followed by [name] on Vicodin prn, ?Allopurinol200- 1.5tabs/d (labs 6/14 Uric=4.5) per Rheum, & off Colcrys...    Anxiety> on Alpraz0.25mg  prn... We  reviewed prob list, meds, xrays and labs> see below for updates >>  LABS 6/14 by [name]:  Chems- wnl x Cr=1.3;  CBC- wnl w/ Hg=12.5;  Uric=4.5.[name].  ~  August 16, 2013:  5mo ROV & [name] is c/o back & leg pain, thinks she wants another epidural shot & says she will sched this herself; she is not using the Hydrocodone, just Tylenol & we discussed trial Tramadol in addition... She is also c/o dermatitis in legs- has seen [name] for this & will f/u w/ them as well...     HBP> on Diltiazem180 & Furosemide 40mg -2Qam w/ BP=126/64 today & she denies CP, palpit, dizzy, ch in SOB, etc...    VI, Hx Edema> she knows to elim sodium, elevate, wear support hose, & the Lasix80; weight is back up 8# today to 149# w/ mild edema; [name] (Derm) prev treated dermatitis w/ new cream & it improved...     CHOL> on Simva20 Qhs + low fat diet; last FLP 5/13 showed TChol 191, TG 81, HDL 82, LDL 93... Needs to ret fasting for f/u blood work.    Hypothy> on Synthroid - 1/2 tab daily; labs 3/14 showed TSH= 4.28    Renal Insuffic> Creat had prev up to 1.8 on the Lasix 80mg ; Lab 1/14 showed BUN=30, Creat=1.5 on the Lasix80/d; repeat labs 6/14 by [name] showed Cr=1.3 & it is 1.3 today... We reviewed prob list, meds, xrays and labs> see below for updates >> ok 2014 Flu vacine today...  LABS 11/14:  Chems- ok w/ BS=108, Cr=1.3;  Uric=4.3 on Allopurinol100;  CBC- ok w/ Hg=12.6.Marland KitchenMarland Kitchen           Problem List:  GLAUCOMA (ICD-365.9) - hx mult eye problems w/ prev corneal transplant & glaucoma laser eye surg... on eye drops per Ophthalmology at [facility]...  ASTHMATIC BRONCHITIS, ACUTE (ICD-466.0) - Hx AB requiring Rx w/ Depo/ Pred/ Mucinex/ Tussionex... ~  CXR 4/11 showed vague nodular densities on left... ~  8/11:  she went to an [facility] w/ cough, sputum, & dx w/ pneumonia rx w/ ZPak... ~  CXR 3/12 showed borderline cardiomeg, nodular opac left base, NAD... ~  Spring 2013: she notes some allergy symptoms...  HYPERTENSION (ICD-401.9) - controlled on ASA 81mg /d,  CARTIAXT 180mg /d, LASIX 40mg -  1-2Qam... ~  7/10:  labs showed BUN=54, Creat=2.0 & rec to decr diuretics in half= one Demedex & 1/2 Aldactone. ~  11/10:  labs showed BUN=38, Creat=1.8 & rec to keep same meds. ~  8/11:  labs showed BUN=42, Creat=2.1, K=4.3 ~  1/12:  labs in ER showed BUN=56, Creat=2.67, therefore diuretics decr to Demadex20/d only. ~  3/12:  2DEcho showed mod LVH, norm sys function w/ EF=60-65% & no regional wall motion abn; Gr1DD, mibile density on AoV- prob lambl'e escrescence, mildMR. ~  5/12:  f/u labs in office showed BUN=45, Creat=1.8, on Lasix 80mg  Qam for her edema... ~  9/12:  Labs by [name] 9/12 showed BUN=33, Creat=1.47, edema resolved therefore decr Lasix to 40mg /d. ~  1/13:  BP= 160/76 but hasn't taken meds today; labs improved w/ BUN=26, Creat=1.3, BNP=50 ~  5/13:  BP= 140/78 & denies CP, palpit, SOB, edema; labs improved w/ BUN=28, Creat=1.2 ~  9/13:  BP= 140/60 & she denies CP, palpt, ch in SOB or edema... ~  12/13: controlled on Diltiazem180 & Furosemide 40mg -2Qam w/ BP=138/62 today & she denies CP, palpit, dizzy, ch inSOB, etc... ~  3/14:  BP= 130/66 on same meds; Labs stable as well  w/ Cr=1.5, continue same... ~  7/14:  on Diltiazem180 & Furosemide 40mg -2Qam w/ BP=150/64 today & she denies CP, palpit, dizzy, ch in SOB, etc. ~  11/14: BP= 126/64 on same meds, stable...  PALPITATIONS, HX OF (ICD-V12.50)  VENOUS INSUFFICIENCY (ICD-459.81) - swelling controlled w/ diuretics, low sodium diet, elevation, support hose, etc... ~  She saw [name]  [facility]- there was nothing he could do... ~  Swelling diminished w/ low sodium, elevation, support hose, & Lasix40 1-2tabs daily... ~  Nov-Dec2013: she knows to elim sodium, elevate, wear support hose; she has been seen by [facility], [name] & there is nothing they can do; recently checked by [name]- DrJones w/ patch dressing & support hose; edema decr w/ Lasix 80mg /d; Lab today shows Creat=1.5.[name]. ~  7/14:  she knows to elim sodium, elevate, wear support hose, & the Lasix80; weight is down 10# today to 141# w/ resolution of edema; [name] (Derm) treated dermatitis w/ new cream & improved. ~  11/14: wt is back up 8# to 149# today & she needs to elim sodium, elev legs, etc...  HYPERCHOLESTEROLEMIA (ICD-272.0) - prev Rx'd by [name]... Now on ZOCOR 20mg /d (prev Crestor5 & Lescol from [facility]) ~  she brought labs from [facility] 5/10 (off med due to $$$) w/ TChol 285, TG 204, HDL 69, LDL 195... "he was mad at me" ~  FLP 5/11 from [facility] showed TChol 202, TG 60, HDL 112, LDL 99 ~  She is reminded (again) to come FASTING for f/u FLP... ~  FLP 5/13 on Simva20 showed TChol 191, TG 81, HDL 82, LDL 93  HYPOTHYROIDISM (ICD-244.9) - prev followed by [name] on SYNTHROID - 1/2 daily... ~  pt brought labs from [facility] 5/10 w/ TSH= 2.90, FreeT4= 1.09 ~  labs 8/11 showed TSH= 1.87 ~  Labs 3/12 showed TSH= 2.81 ~  Labs 1/13 showed TSH= 1.65 ~  Labs 3/14 showed TSH= 4.28  GERD (ICD-530.81) - uses PRILOSEC 20mg /d... last EGD was 6/06 by DrPatterson showing 3cmHH, reflux...   DIVERTICULOSIS OF COLON (ICD-562.10) & COLONIC POLYPS (ICD-211.3) - last colonoscopy 6/06 was WNL- no abnormality seen...  RENAL INSUFFICIENCY (ICD-588.9) - tough balance betw renal perfusion & diuresis for edema... ~  labs 10/08 showed BUN= 27, Creat= 1.5 ~  labs 8/09 showed BUN= 44, Creat= 1.9 ~  labs 2/10 showed BUN 46, Creat= 1.7 ~  labs 7/10 showed BUN= 54, Creat= 2.0 ~  labs 11/10 showed BUN= 38, Creat= 1.8 ~  labs 8/11 showed BUN= 42,  Creat= 2.1 ~  labs in ER 1/12 showed BUN=56, Creat=2.67, rec decr diuretics to Demadex20mg /d only. ~  Labs 3/12 showed improved renal function w/ BUN=31, Creat=1.4... ~  Labs 5/12 on Lasix80 showed BUN=45, Creat=1.8 ~  Labs 9/12 by [name] showed BUN=33, Creat=1.47 ~  Labs 1/13 showed BUN=26, Creat=1.3, BNP=50... On Lasix 40mg  Qam. ~  Labs 5/13 showed BUN=28, Creat=1.2 ~  Labs 12/13 on Lasix80 showed BUN=30, Creat=1.5 ~  Labs 3/14 on Lasix80 showed BUN=31, Creat=1.5 ~  Labs 6/14 by [name] on Lasix80 showed BUN=30, Cr=1.3 ~  Labs here 11/14 on Lasix80 showed BUN=31, Cr= 1.3  DEGENERATIVE JOINT DISEASE (ICD-715.90) - this is her chief complaint- s/p right TKR in 1999,  left TKR 3/10... on MOBIC 7.5mg  Prn & VICODIN Prn as well... followed by [name]; and had right second toe amp by Volney Presser 2010 for hammertoe + spurs... also had epidural steroid shot from [facility] for LBP (without benefit she says)... ~  5/13:  She saw [name] for Rheum  w/ shot in hip & sl improved...  VITAMIN D DEFICIENCY (ICD-268.9) ~  labs 8/09 showed Vit D level = 12... rec- start Vit D 50,000 u weekly... ~  labs 2/10 showed Vit D level = 30... rec- continue Vit D 50K weekly... ~  labs 7/10 showed Vit D level = 39... rec- change to 1000 u OTC daily. ~  labs 8/11 showed Vit D level = 38... continue same. ~  Labs 3/14 showed Vit D level = 43... Continue 1000u daily.  ANXIETY (ICD-300.00) - on ALPRAZOLAM 0.25mg Tid Prn... several deaths in the family... daughter who lives w/ her recently ill...  SHINGLES - she had right T9-10 shingles in 2011 & resolved w/ Valtrex, Pred, etc...   Past Surgical History  Procedure Laterality Date  . Cataract extraction    . Abdominal hysterectomy    . Breast biopsy      Benign  . Total knee arthroplasty  1999    right  . Total knee arthroplasty  11/2008    left - Dr Ophelia Charter  . Toe amputation  08/2009    Right second toe - Dr Lestine Box    Outpatient Encounter Prescriptions  as of 08/16/2013  Medication Sig  . acetaminophen (TYLENOL) 325 MG tablet Per bottle as needed  . allopurinol (ZYLOPRIM) 100 MG tablet Take 100 mg by mouth daily.    [name] ALPRAZolam (XANAX) 0.25 MG tablet TAKE 1/2 TO 1 TABLET 3 TIMES A DAY AS NEEDED  . Ascorbic Acid (VITAMIN C) 500 MG tablet Take 500 mg by mouth daily.    [name] aspirin 81 MG tablet Take 81 mg by mouth daily.    . Cholecalciferol (VITAMIN D) 1000 UNITS capsule Take 1,000 Units by mouth daily.    [name] diltiazem (CARDIZEM CD) 180 MG 24 hr capsule TAKE 1 CAPSULE DAILY  . furosemide (LASIX) 40 MG tablet TAKE 2 TABLETS BY MOUTH ONCE EVERY MORNING  . guaiFENesin (MUCINEX) 600 MG 12 hr tablet Take 1,200 mg by mouth 2 (two) times daily as needed.   [name] HYDROcodone-acetaminophen (VICODIN) 5-500 MG per tablet Take 1/2 to 1 By mouth three times a day as needed for pain  . levothyroxine (SYNTHROID, LEVOTHROID) 125 MCG tablet Take 1/2 tablet By mouth once daily  . omeprazole (PRILOSEC) 20 MG capsule Take 20 mg by mouth daily.    . simvastatin (ZOCOR) 20 MG tablet TAKE 1 TAB BY MOUTH AT BEDTIME  . timolol (TIMOPTIC) 0.5 % ophthalmic solution Place 1 drop into both eyes 2 (two) times daily.    [name] triamcinolone (KENALOG) 0.025 % cream Apply 1 application topically Twice daily.  Bertram Gala Glycol-Propyl Glycol (SYSTANE) 0.4-0.3 % SOLN One drop in each eye two times a day   . [DISCONTINUED] levothyroxine (SYNTHROID, LEVOTHROID) 125 MCG tablet TAKE 1/2 TABLET BY MOUTH ONCE DAILY  . [DISCONTINUED] levothyroxine (SYNTHROID, LEVOTHROID) 125 MCG tablet TAKE 1/2 TABLET BY MOUTH ONCE DAILY    Allergies  Allergen Reactions  . Enablex [Darifenacin Hydrobromide Er]     Caused her throat and mouth to have bumps and feel like it was swelling  . Clarithromycin     REACTION: causes her mouth to swell  . Codeine     Makes pt nervous  . Morphine     REACTION: n/v  . Sulfonamide Derivatives     REACTION: unsure of reaction    Current Medications, Allergies,  Past Medical History, Past Surgical History, Family History, and Social History were reviewed in [employer] record.  Review of Systems        See HPI - all other systems neg except as noted...  The patient complains of dyspnea on exertion, peripheral edema, muscle weakness, and difficulty walking.  The patient denies anorexia, fever, weight loss, weight gain, vision loss, decreased hearing, hoarseness, chest pain, syncope, prolonged cough, headaches, hemoptysis, abdominal pain, melena, hematochezia, severe indigestion/heartburn, hematuria, incontinence, genital sores, suspicious skin lesions, transient blindness, depression, unusual weight change, abnormal bleeding, enlarged lymph nodes, and angioedema.     Objective:   Physical Exam      WD, WN, 79 y/o WF in NAD... GENERAL:  Alert & oriented; pleasant & cooperative... HEENT:  [place]/AT, EOM- full, EACs-clear, TMs-wnl, NOSE-clear, THROAT-clear & wnl. NECK:  Supple w/ fairROM; no JVD; normal carotid impulses w/o bruits; no thyromegaly or nodules palpated; no lymphadenopathy. CHEST:  Clear, decr BS bilat w/o wheezing, rales, or rhonchi heard... HEART:  Regular Rhythm;  gr 1/6 SEM without rubs or gallops detected... ABDOMEN:  Soft & nontender; normal bowel sounds; no organomegaly or masses detected. EXT:  moderate arthritic changes; s/p bilat TKRs; mild varicose veins/ +venous insuffic/ 2+edema R>L s/p right second toe distal amputation... NEURO:  CN's intact;  no focal neuro deficits... DERM:  No lesions noted; no rash etc...  RADIOLOGY DATA:  Reviewed in the EPIC EMR & discussed w/ the patient...  LABORATORY DATA:  Reviewed in the EPIC EMR & discussed w/ the patient...   Assessment & Plan:    HBP>  Controlled on meds, continue same including the Lasix40- 2Qam...  Cardiac>  DrRoss' note reviewed; see 2DEcho, Event Monitor reports no dangerous arrhythmias, continue meds...  Ven Insuffic/ EDEMA- improved>  evals  by Cards & [facility]> "there is nothing they can do"; continue elevation, no salt, Lasix80 now, compression...  CHOL>  On Simva20 & FLP 5/13 looked good...  HYPOTHYROID>  Continue Synthroid 144mcg/d taking 1/2 tab daily...  GI>  Stable & followed by DrPatterson...  Renal Insuffic>  Careful w/ diuresis, NSAIDs etc; Creat improved to 1.3 back on her 80mg  Lasix.  DJD>  She saw [name] for her Rheum complaints==> shot in knee; [name]/ Alvester Morin have signed off; may need pain clinic....  Other problems as noted...   Patient's Medications  New Prescriptions   TRAMADOL (ULTRAM) 50 MG TABLET    Take 1 tablet (50 mg total) by mouth 3 (three) times daily as needed.  Previous Medications   ACETAMINOPHEN (TYLENOL) 325 MG TABLET    Per bottle as needed   ALLOPURINOL (ZYLOPRIM) 100 MG TABLET    Take 100 mg by mouth daily.     ALPRAZOLAM (XANAX) 0.25 MG TABLET    TAKE 1/2 TO 1 TABLET 3 TIMES A DAY AS NEEDED   ASCORBIC ACID (VITAMIN C) 500 MG TABLET    Take 500 mg by mouth daily.     ASPIRIN 81 MG TABLET    Take 81 mg by mouth daily.     CHOLECALCIFEROL (VITAMIN D) 1000 UNITS CAPSULE    Take 1,000 Units by mouth daily.     FUROSEMIDE (LASIX) 40 MG TABLET    TAKE 2 TABLETS BY MOUTH ONCE EVERY MORNING   GUAIFENESIN (MUCINEX) 600 MG 12 HR TABLET    Take 1,200 mg by mouth 2 (two) times daily as needed.    HYDROCODONE-ACETAMINOPHEN (VICODIN) 5-500 MG PER TABLET    Take 1/2 to 1 By mouth three times a day as needed for pain   LEVOTHYROXINE (SYNTHROID, LEVOTHROID) 125 MCG TABLET  Take 1/2 tablet By mouth once daily   OMEPRAZOLE (PRILOSEC) 20 MG CAPSULE    Take 20 mg by mouth daily.     POLYETHYL GLYCOL-PROPYL GLYCOL (SYSTANE) 0.4-0.3 % SOLN    One drop in each eye two times a day    SIMVASTATIN (ZOCOR) 20 MG TABLET    TAKE 1 TAB BY MOUTH AT BEDTIME   TIMOLOL (TIMOPTIC) 0.5 % OPHTHALMIC SOLUTION    Place 1 drop into both eyes 2 (two) times daily.     TRIAMCINOLONE (KENALOG) 0.025 % CREAM    Apply 1  application topically Twice daily.  Modified Medications   Modified Medication Previous Medication   DILTIAZEM (CARDIZEM CD) 180 MG 24 HR CAPSULE diltiazem (CARDIZEM CD) 180 MG 24 hr capsule      TAKE 1 CAPSULE DAILY    TAKE 1 CAPSULE DAILY  Discontinued Medications   LEVOTHYROXINE (SYNTHROID, LEVOTHROID) 125 MCG TABLET    TAKE 1/2 TABLET BY MOUTH ONCE DAILY   LEVOTHYROXINE (SYNTHROID, LEVOTHROID) 125 MCG TABLET    TAKE 1/2 TABLET BY MOUTH ONCE DAILY

## 2013-08-16 NOTE — Patient Instructions (Signed)
Today we updated your med list in our EPIC system...    Continue your current medications the same...  We wrote a new prescription for TRAMADOL to try up to 3 times daily as needed for pain...    You may take this WITH an extra strength Tylenol to potentiate it's effect...  We gave you the 2014 flu vaccine today...  Call for any questions...  Let's plan a follow up visit in 50mo, sooner if needed for problems.Marland KitchenMarland Kitchen

## 2013-08-24 DIAGNOSIS — L408 Other psoriasis: Secondary | ICD-10-CM | POA: Diagnosis not present

## 2013-08-24 DIAGNOSIS — L820 Inflamed seborrheic keratosis: Secondary | ICD-10-CM | POA: Diagnosis not present

## 2013-08-24 DIAGNOSIS — Z85828 Personal history of other malignant neoplasm of skin: Secondary | ICD-10-CM | POA: Diagnosis not present

## 2013-09-06 DIAGNOSIS — L738 Other specified follicular disorders: Secondary | ICD-10-CM | POA: Diagnosis not present

## 2013-09-06 DIAGNOSIS — L820 Inflamed seborrheic keratosis: Secondary | ICD-10-CM | POA: Diagnosis not present

## 2013-09-06 DIAGNOSIS — L408 Other psoriasis: Secondary | ICD-10-CM | POA: Diagnosis not present

## 2013-09-06 DIAGNOSIS — Z85828 Personal history of other malignant neoplasm of skin: Secondary | ICD-10-CM | POA: Diagnosis not present

## 2013-09-13 DIAGNOSIS — Z85828 Personal history of other malignant neoplasm of skin: Secondary | ICD-10-CM | POA: Diagnosis not present

## 2013-09-13 DIAGNOSIS — L259 Unspecified contact dermatitis, unspecified cause: Secondary | ICD-10-CM | POA: Diagnosis not present

## 2013-09-13 DIAGNOSIS — L408 Other psoriasis: Secondary | ICD-10-CM | POA: Diagnosis not present

## 2013-09-13 DIAGNOSIS — L820 Inflamed seborrheic keratosis: Secondary | ICD-10-CM | POA: Diagnosis not present

## 2013-09-13 DIAGNOSIS — I8310 Varicose veins of unspecified lower extremity with inflammation: Secondary | ICD-10-CM | POA: Diagnosis not present

## 2013-10-05 ENCOUNTER — Encounter (HOSPITAL_COMMUNITY): Payer: Self-pay | Admitting: Emergency Medicine

## 2013-10-05 ENCOUNTER — Inpatient Hospital Stay (HOSPITAL_COMMUNITY)
Admission: EM | Admit: 2013-10-05 | Discharge: 2013-10-11 | DRG: 444 | Disposition: A | Discharge status: Home / Self Care | Payer: Medicare Other | Attending: Internal Medicine | Admitting: Internal Medicine

## 2013-10-05 DIAGNOSIS — K8040 Calculus of bile duct with cholecystitis, unspecified, without obstruction: Secondary | ICD-10-CM | POA: Diagnosis not present

## 2013-10-05 DIAGNOSIS — N183 Chronic kidney disease, stage 3 unspecified: Secondary | ICD-10-CM | POA: Diagnosis present

## 2013-10-05 DIAGNOSIS — K8050 Calculus of bile duct without cholangitis or cholecystitis without obstruction: Secondary | ICD-10-CM | POA: Diagnosis not present

## 2013-10-05 DIAGNOSIS — R1011 Right upper quadrant pain: Secondary | ICD-10-CM

## 2013-10-05 DIAGNOSIS — K219 Gastro-esophageal reflux disease without esophagitis: Secondary | ICD-10-CM | POA: Diagnosis present

## 2013-10-05 DIAGNOSIS — R079 Chest pain, unspecified: Secondary | ICD-10-CM | POA: Diagnosis not present

## 2013-10-05 DIAGNOSIS — N259 Disorder resulting from impaired renal tubular function, unspecified: Secondary | ICD-10-CM | POA: Diagnosis present

## 2013-10-05 DIAGNOSIS — E559 Vitamin D deficiency, unspecified: Secondary | ICD-10-CM | POA: Diagnosis present

## 2013-10-05 DIAGNOSIS — H409 Unspecified glaucoma: Secondary | ICD-10-CM | POA: Diagnosis present

## 2013-10-05 DIAGNOSIS — R05 Cough: Secondary | ICD-10-CM | POA: Diagnosis not present

## 2013-10-05 DIAGNOSIS — L03119 Cellulitis of unspecified part of limb: Secondary | ICD-10-CM

## 2013-10-05 DIAGNOSIS — Z7982 Long term (current) use of aspirin: Secondary | ICD-10-CM

## 2013-10-05 DIAGNOSIS — R509 Fever, unspecified: Secondary | ICD-10-CM | POA: Diagnosis not present

## 2013-10-05 DIAGNOSIS — I359 Nonrheumatic aortic valve disorder, unspecified: Secondary | ICD-10-CM | POA: Diagnosis present

## 2013-10-05 DIAGNOSIS — I10 Essential (primary) hypertension: Secondary | ICD-10-CM | POA: Diagnosis present

## 2013-10-05 DIAGNOSIS — Z96659 Presence of unspecified artificial knee joint: Secondary | ICD-10-CM | POA: Diagnosis not present

## 2013-10-05 DIAGNOSIS — K8041 Calculus of bile duct with cholecystitis, unspecified, with obstruction: Principal | ICD-10-CM | POA: Diagnosis present

## 2013-10-05 DIAGNOSIS — R059 Cough, unspecified: Secondary | ICD-10-CM | POA: Diagnosis not present

## 2013-10-05 DIAGNOSIS — K810 Acute cholecystitis: Secondary | ICD-10-CM | POA: Diagnosis present

## 2013-10-05 DIAGNOSIS — F411 Generalized anxiety disorder: Secondary | ICD-10-CM | POA: Diagnosis present

## 2013-10-05 DIAGNOSIS — E7800 Pure hypercholesterolemia, unspecified: Secondary | ICD-10-CM | POA: Diagnosis not present

## 2013-10-05 DIAGNOSIS — R52 Pain, unspecified: Secondary | ICD-10-CM | POA: Diagnosis not present

## 2013-10-05 DIAGNOSIS — K8051 Calculus of bile duct without cholangitis or cholecystitis with obstruction: Secondary | ICD-10-CM

## 2013-10-05 DIAGNOSIS — E039 Hypothyroidism, unspecified: Secondary | ICD-10-CM | POA: Diagnosis present

## 2013-10-05 DIAGNOSIS — L02419 Cutaneous abscess of limb, unspecified: Secondary | ICD-10-CM | POA: Diagnosis not present

## 2013-10-05 DIAGNOSIS — I129 Hypertensive chronic kidney disease with stage 1 through stage 4 chronic kidney disease, or unspecified chronic kidney disease: Secondary | ICD-10-CM | POA: Diagnosis present

## 2013-10-05 DIAGNOSIS — Z228 Carrier of other infectious diseases: Secondary | ICD-10-CM | POA: Diagnosis not present

## 2013-10-05 DIAGNOSIS — R6510 Systemic inflammatory response syndrome (SIRS) of non-infectious origin without acute organ dysfunction: Secondary | ICD-10-CM

## 2013-10-05 DIAGNOSIS — R000 Tachycardia, unspecified: Secondary | ICD-10-CM | POA: Diagnosis not present

## 2013-10-05 DIAGNOSIS — D649 Anemia, unspecified: Secondary | ICD-10-CM

## 2013-10-05 DIAGNOSIS — S98139A Complete traumatic amputation of one unspecified lesser toe, initial encounter: Secondary | ICD-10-CM

## 2013-10-05 DIAGNOSIS — R0789 Other chest pain: Secondary | ICD-10-CM | POA: Diagnosis not present

## 2013-10-05 DIAGNOSIS — K8020 Calculus of gallbladder without cholecystitis without obstruction: Secondary | ICD-10-CM | POA: Diagnosis not present

## 2013-10-05 DIAGNOSIS — A419 Sepsis, unspecified organism: Secondary | ICD-10-CM | POA: Diagnosis not present

## 2013-10-05 DIAGNOSIS — R8271 Bacteriuria: Secondary | ICD-10-CM

## 2013-10-05 DIAGNOSIS — L0390 Cellulitis, unspecified: Secondary | ICD-10-CM

## 2013-10-05 DIAGNOSIS — I87309 Chronic venous hypertension (idiopathic) without complications of unspecified lower extremity: Secondary | ICD-10-CM | POA: Diagnosis not present

## 2013-10-05 DIAGNOSIS — M1990 Unspecified osteoarthritis, unspecified site: Secondary | ICD-10-CM | POA: Diagnosis present

## 2013-10-05 HISTORY — DX: Chronic kidney disease, stage 3 (moderate): N18.3

## 2013-10-05 NOTE — ED Notes (Signed)
Pt. reports right chest pain with SOB and nausea onset this evening . Pt. took Pepto Bismol with no relief.

## 2013-10-06 ENCOUNTER — Inpatient Hospital Stay (HOSPITAL_COMMUNITY): Payer: Medicare Other

## 2013-10-06 ENCOUNTER — Inpatient Hospital Stay (HOSPITAL_COMMUNITY): Payer: Medicare Other | Admitting: Certified Registered Nurse Anesthetist

## 2013-10-06 ENCOUNTER — Emergency Department (HOSPITAL_COMMUNITY): Payer: Medicare Other

## 2013-10-06 ENCOUNTER — Encounter (HOSPITAL_COMMUNITY)
Admission: EM | Disposition: A | Discharge status: Home / Self Care | Payer: Self-pay | Source: Home / Self Care | Attending: Internal Medicine

## 2013-10-06 ENCOUNTER — Encounter (HOSPITAL_COMMUNITY): Payer: Self-pay | Admitting: Internal Medicine

## 2013-10-06 ENCOUNTER — Encounter (HOSPITAL_COMMUNITY): Payer: Medicare Other | Admitting: Certified Registered Nurse Anesthetist

## 2013-10-06 DIAGNOSIS — E7800 Pure hypercholesterolemia, unspecified: Secondary | ICD-10-CM

## 2013-10-06 DIAGNOSIS — L03119 Cellulitis of unspecified part of limb: Secondary | ICD-10-CM

## 2013-10-06 DIAGNOSIS — K8051 Calculus of bile duct without cholangitis or cholecystitis with obstruction: Secondary | ICD-10-CM

## 2013-10-06 DIAGNOSIS — N183 Chronic kidney disease, stage 3 unspecified: Secondary | ICD-10-CM

## 2013-10-06 DIAGNOSIS — I359 Nonrheumatic aortic valve disorder, unspecified: Secondary | ICD-10-CM

## 2013-10-06 DIAGNOSIS — K8020 Calculus of gallbladder without cholecystitis without obstruction: Secondary | ICD-10-CM | POA: Diagnosis present

## 2013-10-06 DIAGNOSIS — D649 Anemia, unspecified: Secondary | ICD-10-CM

## 2013-10-06 DIAGNOSIS — K810 Acute cholecystitis: Secondary | ICD-10-CM | POA: Diagnosis present

## 2013-10-06 DIAGNOSIS — K8050 Calculus of bile duct without cholangitis or cholecystitis without obstruction: Secondary | ICD-10-CM

## 2013-10-06 DIAGNOSIS — L02419 Cutaneous abscess of limb, unspecified: Secondary | ICD-10-CM

## 2013-10-06 DIAGNOSIS — R52 Pain, unspecified: Secondary | ICD-10-CM

## 2013-10-06 DIAGNOSIS — I10 Essential (primary) hypertension: Secondary | ICD-10-CM

## 2013-10-06 DIAGNOSIS — R1011 Right upper quadrant pain: Secondary | ICD-10-CM

## 2013-10-06 DIAGNOSIS — K8040 Calculus of bile duct with cholecystitis, unspecified, without obstruction: Secondary | ICD-10-CM

## 2013-10-06 HISTORY — PX: ERCP: SHX5425

## 2013-10-06 HISTORY — DX: Chronic kidney disease, stage 3 unspecified: N18.30

## 2013-10-06 LAB — COMPREHENSIVE METABOLIC PANEL
ALBUMIN: 3.6 g/dL (ref 3.5–5.2)
ALT: 86 U/L — ABNORMAL HIGH (ref 0–35)
ALT: 228 U/L — ABNORMAL HIGH (ref 0–35)
AST: 167 U/L — ABNORMAL HIGH (ref 0–37)
AST: 481 U/L — ABNORMAL HIGH (ref 0–37)
Albumin: 2.9 g/dL — ABNORMAL LOW (ref 3.5–5.2)
Alkaline Phosphatase: 129 U/L — ABNORMAL HIGH (ref 39–117)
Alkaline Phosphatase: 171 U/L — ABNORMAL HIGH (ref 39–117)
BUN: 33 mg/dL — ABNORMAL HIGH (ref 6–23)
BUN: 36 mg/dL — AB (ref 6–23)
CALCIUM: 8.7 mg/dL (ref 8.4–10.5)
CALCIUM: 9.1 mg/dL (ref 8.4–10.5)
CO2: 29 mEq/L (ref 19–32)
CO2: 27 meq/L (ref 19–32)
CREATININE: 1.41 mg/dL — AB (ref 0.50–1.10)
CREATININE: 1.59 mg/dL — AB (ref 0.50–1.10)
Chloride: 92 mEq/L — ABNORMAL LOW (ref 96–112)
Chloride: 98 mEq/L (ref 96–112)
GFR calc non Af Amer: 26 mL/min — ABNORMAL LOW (ref >90)
GFR, EST AFRICAN AMERICAN: 30 mL/min — AB (ref >90)
GFR, EST AFRICAN AMERICAN: 35 mL/min — AB (ref >90)
GFR, EST NON AFRICAN AMERICAN: 30 mL/min — AB (ref >90)
GLUCOSE: 113 mg/dL — AB (ref 70–99)
GLUCOSE: 124 mg/dL — AB (ref 70–99)
Potassium: 4 mEq/L (ref 3.7–5.3)
Potassium: 4.2 mEq/L (ref 3.7–5.3)
Sodium: 133 mEq/L — ABNORMAL LOW (ref 137–147)
Sodium: 138 mEq/L (ref 137–147)
TOTAL PROTEIN: 7.1 g/dL (ref 6.0–8.3)
Total Bilirubin: 2.2 mg/dL — ABNORMAL HIGH (ref 0.3–1.2)
Total Bilirubin: 6 mg/dL — ABNORMAL HIGH (ref 0.3–1.2)
Total Protein: 6 g/dL (ref 6.0–8.3)

## 2013-10-06 LAB — CBC
HCT: 30 % — ABNORMAL LOW (ref 36.0–46.0)
HEMOGLOBIN: 9.9 g/dL — AB (ref 12.0–15.0)
MCH: 30.8 pg (ref 26.0–34.0)
MCHC: 33 g/dL (ref 30.0–36.0)
MCV: 93.5 fL (ref 78.0–100.0)
Platelets: 152 10*3/uL (ref 150–400)
RBC: 3.21 MIL/uL — AB (ref 3.87–5.11)
RDW: 16.2 % — ABNORMAL HIGH (ref 11.5–15.5)
WBC: 10.8 10*3/uL — ABNORMAL HIGH (ref 4.0–10.5)

## 2013-10-06 LAB — CBC WITH DIFFERENTIAL/PLATELET
BASOS PCT: 1 % (ref 0–1)
Basophils Absolute: 0.1 10*3/uL (ref 0.0–0.1)
Eosinophils Absolute: 0.2 10*3/uL (ref 0.0–0.7)
Eosinophils Relative: 2 % (ref 0–5)
HCT: 31.2 % — ABNORMAL LOW (ref 36.0–46.0)
Hemoglobin: 10.1 g/dL — ABNORMAL LOW (ref 12.0–15.0)
LYMPHS PCT: 16 % (ref 12–46)
Lymphs Abs: 1.6 10*3/uL (ref 0.7–4.0)
MCH: 30.9 pg (ref 26.0–34.0)
MCHC: 32.4 g/dL (ref 30.0–36.0)
MCV: 95.4 fL (ref 78.0–100.0)
Monocytes Absolute: 0.7 10*3/uL (ref 0.1–1.0)
Monocytes Relative: 8 % (ref 3–12)
NEUTROS ABS: 7.1 10*3/uL (ref 1.7–7.7)
Neutrophils Relative %: 73 % (ref 43–77)
PLATELETS: 190 10*3/uL (ref 150–400)
RBC: 3.27 MIL/uL — AB (ref 3.87–5.11)
RDW: 16.1 % — AB (ref 11.5–15.5)
WBC: 9.7 10*3/uL (ref 4.0–10.5)

## 2013-10-06 LAB — PROTIME-INR
INR: 1.06 (ref 0.00–1.49)
Prothrombin Time: 13.6 seconds (ref 11.6–15.2)

## 2013-10-06 LAB — TROPONIN I

## 2013-10-06 LAB — PRO B NATRIURETIC PEPTIDE: PRO B NATRI PEPTIDE: 329.3 pg/mL (ref 0–450)

## 2013-10-06 LAB — LIPASE, BLOOD: Lipase: 22 U/L (ref 11–59)

## 2013-10-06 SURGERY — ERCP, WITH INTERVENTION IF INDICATED
Anesthesia: General

## 2013-10-06 MED ORDER — ONDANSETRON HCL 4 MG/2ML IJ SOLN
4.0000 mg | Freq: Once | INTRAMUSCULAR | Status: AC
  Administered 2013-10-06: 4 mg via INTRAVENOUS
  Filled 2013-10-06: qty 2

## 2013-10-06 MED ORDER — MORPHINE SULFATE 2 MG/ML IJ SOLN: 2.0000 mg | INTRAMUSCULAR | Status: DC | PRN

## 2013-10-06 MED ORDER — CIPROFLOXACIN IN D5W 400 MG/200ML IV SOLN
400.0000 mg | INTRAVENOUS | Status: DC
  Administered 2013-10-07 – 2013-10-10 (×4): 400 mg via INTRAVENOUS
  Filled 2013-10-06 (×4): qty 200

## 2013-10-06 MED ORDER — ONDANSETRON HCL 4 MG/2ML IJ SOLN
4.0000 mg | Freq: Three times a day (TID) | INTRAMUSCULAR | Status: AC | PRN
Start: 2013-10-06 — End: 2013-10-06

## 2013-10-06 MED ORDER — SODIUM CHLORIDE 0.9 % IV SOLN
INTRAVENOUS | Status: AC
  Administered 2013-10-06: 03:00:00 via INTRAVENOUS

## 2013-10-06 MED ORDER — SODIUM CHLORIDE 0.9 % IV SOLN
INTRAVENOUS | Status: DC | PRN
  Administered 2013-10-06: 16:00:00

## 2013-10-06 MED ORDER — LACTATED RINGERS IV SOLN
INTRAVENOUS | Status: DC | PRN
  Administered 2013-10-06: 15:00:00 via INTRAVENOUS

## 2013-10-06 MED ORDER — DILTIAZEM HCL ER COATED BEADS 180 MG PO CP24
180.0000 mg | ORAL_CAPSULE | Freq: Every day | ORAL | Status: DC
  Administered 2013-10-07 – 2013-10-11 (×4): 180 mg via ORAL
  Filled 2013-10-06 (×6): qty 1

## 2013-10-06 MED ORDER — MORPHINE SULFATE 2 MG/ML IJ SOLN
2.0000 mg | Freq: Once | INTRAMUSCULAR | Status: AC
  Administered 2013-10-06: 2 mg via INTRAVENOUS
  Filled 2013-10-06: qty 1

## 2013-10-06 MED ORDER — ALLOPURINOL 100 MG PO TABS
100.0000 mg | ORAL_TABLET | Freq: Every day | ORAL | Status: DC
  Administered 2013-10-07 – 2013-10-11 (×4): 100 mg via ORAL
  Filled 2013-10-06 (×6): qty 1

## 2013-10-06 MED ORDER — LEVOTHYROXINE SODIUM 125 MCG PO TABS
62.5000 ug | ORAL_TABLET | Freq: Every day | ORAL | Status: DC
  Administered 2013-10-07 – 2013-10-11 (×4): 62.5 ug via ORAL
  Filled 2013-10-06 (×6): qty 0.5

## 2013-10-06 MED ORDER — ALPRAZOLAM 0.25 MG PO TABS
0.2500 mg | ORAL_TABLET | Freq: Three times a day (TID) | ORAL | Status: DC | PRN
Start: 2013-10-06 — End: 2013-10-11
  Administered 2013-10-07 – 2013-10-10 (×4): 0.25 mg via ORAL
  Filled 2013-10-06 (×4): qty 1

## 2013-10-06 MED ORDER — SODIUM CHLORIDE 0.9 % IV SOLN: INTRAVENOUS | Status: AC

## 2013-10-06 MED ORDER — ONDANSETRON HCL 4 MG/2ML IJ SOLN: 4.0000 mg | Freq: Four times a day (QID) | INTRAMUSCULAR | Status: DC | PRN

## 2013-10-06 MED ORDER — LIDOCAINE HCL (CARDIAC) 20 MG/ML IV SOLN
INTRAVENOUS | Status: DC | PRN
  Administered 2013-10-06: 60 mg via INTRAVENOUS

## 2013-10-06 MED ORDER — FENTANYL CITRATE 0.05 MG/ML IJ SOLN: 25.0000 ug | INTRAMUSCULAR | Status: DC | PRN

## 2013-10-06 MED ORDER — ACETAMINOPHEN 325 MG PO TABS
650.0000 mg | ORAL_TABLET | Freq: Four times a day (QID) | ORAL | Status: DC | PRN
  Administered 2013-10-06 – 2013-10-07 (×2): 650 mg via ORAL
  Filled 2013-10-06 (×2): qty 2

## 2013-10-06 MED ORDER — VANCOMYCIN HCL IN DEXTROSE 1-5 GM/200ML-% IV SOLN
1000.0000 mg | Freq: Once | INTRAVENOUS | Status: AC
  Administered 2013-10-06: 1000 mg via INTRAVENOUS
  Filled 2013-10-06: qty 200

## 2013-10-06 MED ORDER — MORPHINE SULFATE 4 MG/ML IJ SOLN
4.0000 mg | Freq: Once | INTRAMUSCULAR | Status: AC
  Administered 2013-10-06: 4 mg via INTRAVENOUS

## 2013-10-06 MED ORDER — FENTANYL CITRATE 0.05 MG/ML IJ SOLN
INTRAMUSCULAR | Status: DC | PRN
  Administered 2013-10-06: 75 ug via INTRAVENOUS

## 2013-10-06 MED ORDER — TRAMADOL HCL 50 MG PO TABS
50.0000 mg | ORAL_TABLET | Freq: Three times a day (TID) | ORAL | Status: DC | PRN
  Administered 2013-10-08 – 2013-10-10 (×4): 50 mg via ORAL
  Filled 2013-10-06 (×4): qty 1

## 2013-10-06 MED ORDER — ROCURONIUM BROMIDE 100 MG/10ML IV SOLN
INTRAVENOUS | Status: DC | PRN
Start: 2013-10-06 — End: 2013-10-06
  Administered 2013-10-06: 20 mg via INTRAVENOUS

## 2013-10-06 MED ORDER — ONDANSETRON HCL 4 MG PO TABS: 4.0000 mg | ORAL_TABLET | Freq: Four times a day (QID) | ORAL | Status: DC | PRN

## 2013-10-06 MED ORDER — NEOSTIGMINE METHYLSULFATE 1 MG/ML IJ SOLN
INTRAMUSCULAR | Status: DC | PRN
  Administered 2013-10-06: 3 mg via INTRAVENOUS

## 2013-10-06 MED ORDER — ONDANSETRON HCL 4 MG/2ML IJ SOLN
INTRAMUSCULAR | Status: DC | PRN
  Administered 2013-10-06: 4 mg via INTRAVENOUS

## 2013-10-06 MED ORDER — VANCOMYCIN HCL 500 MG IV SOLR
500.0000 mg | INTRAVENOUS | Status: DC
  Administered 2013-10-07 – 2013-10-10 (×4): 500 mg via INTRAVENOUS
  Filled 2013-10-06 (×5): qty 500

## 2013-10-06 MED ORDER — CIPROFLOXACIN IN D5W 400 MG/200ML IV SOLN
400.0000 mg | Freq: Two times a day (BID) | INTRAVENOUS | Status: DC
  Administered 2013-10-06: 400 mg via INTRAVENOUS
  Filled 2013-10-06: qty 200

## 2013-10-06 MED ORDER — GLUCAGON HCL (RDNA) 1 MG IJ SOLR
INTRAMUSCULAR | Status: DC | PRN
  Administered 2013-10-06: 0.25 mg via INTRAVENOUS
  Administered 2013-10-06: .25 mg via INTRAVENOUS

## 2013-10-06 MED ORDER — PROPOFOL 10 MG/ML IV BOLUS
INTRAVENOUS | Status: DC | PRN
Start: 2013-10-06 — End: 2013-10-06
  Administered 2013-10-06: 80 mg via INTRAVENOUS

## 2013-10-06 MED ORDER — ONDANSETRON HCL 4 MG/2ML IJ SOLN: 4.0000 mg | Freq: Once | INTRAMUSCULAR | Status: AC | PRN

## 2013-10-06 MED ORDER — GLYCOPYRROLATE 0.2 MG/ML IJ SOLN
INTRAMUSCULAR | Status: DC | PRN
  Administered 2013-10-06: 0.4 mg via INTRAVENOUS

## 2013-10-06 MED ORDER — MORPHINE SULFATE 4 MG/ML IJ SOLN
INTRAMUSCULAR | Status: AC
  Filled 2013-10-06: qty 1

## 2013-10-06 NOTE — Progress Notes (Signed)
I have seen and assessed patient and agree with Dr Camelia Eng assessment and plan. Patient with gallstones with acute cholecystitis and choledocholiathiasis. Patient also noted to have bilateral LE cellulitis. Patient has been seen by [facility]. GI consultation pending. Patient NPO for probable ERCP. Will continue IV Cipro and add IV Vancomycin for cellulitis. Check anemia panel. Follow h/h. Consult with cardiology for cardiac clearance in event patient may require cholecystectomy post ERCP. Follow.

## 2013-10-06 NOTE — Consult Note (Signed)
CARDIOLOGY CONSULT NOTE  Patient ID: BRYER HIROSE MRN: [id number] DOB/AGE: 31-Jul-1915 80 y.o.  Admit date: 10/05/2013 Referring Physician Irine Seal Primary Physician Primary Cardiologist Pau a Harrington Challenger Reason for Consultation preoperative clearance  HPI: Ms. Salzwedel is a 80 year old female admitted with signs and symptoms consistent with cholecystitis. We are asked to see her for preoperative clearance prior to possible cholecystectomy. She has no prior cardiac history. She has no risk factors other than hypertension and hyperlipidemia. She did have a 2-D echocardiogram performed 2012 that showed normal systolic function. She denies chest pain or shortness of breath. She was admitted with right upper quadrant pain and abdominal ultrasound consistent with cholelithiasis. She had positive sonographic Murphy's item, elevated liver function tests and fever.  Past Medical History  Diagnosis Date  . Hypertension   . Hypothyroidism   . Glaucoma   . Acute bronchitis   . Palpitations   . Unspecified venous (peripheral) insufficiency   . Pure hypercholesterolemia   . GERD (gastroesophageal reflux disease)   . Diverticulosis of colon (without mention of hemorrhage)   . Benign neoplasm of colon   . Renal insufficiency   . DJD (degenerative joint disease)   . Vitamin D deficiency disease   . Anxiety state, unspecified   . CKD (chronic kidney disease), stage III 10/06/2013     Past Surgical History  Procedure Laterality Date  . Cataract extraction    . Abdominal hysterectomy    . Breast biopsy      Benign  . Total knee arthroplasty  1999    right  . Total knee arthroplasty  11/2008    left - Dr Lorin Mercy  . Toe amputation  08/2009    Right second toe - Dr Beola Cord     Family History  Problem Relation Age of Onset  . Heart disease Mother   . Cancer Father     Throat  . Heart disease Father     Social History: History   Social History  . Marital Status: Widowed   Spouse Name: N/A    Number of Children: 1  . Years of Education: N/A   Occupational History  .     Social History Main Topics  . Smoking status: Never Smoker   . Smokeless tobacco: Never Used  . Alcohol Use: No  . Drug Use: No  . Sexual Activity: Not on file   Other Topics Concern  . Not on file   Social History Narrative  . No narrative on file     Prescriptions prior to admission  Medication Sig Dispense Refill  . acetaminophen (TYLENOL) 325 MG tablet Per bottle as needed      . allopurinol (ZYLOPRIM) 100 MG tablet Take 100 mg by mouth daily.        [name] ALPRAZolam (XANAX) 0.25 MG tablet TAKE 1/2 TO 1 TABLET 3 TIMES A DAY AS NEEDED  90 tablet  3  . Ascorbic Acid (VITAMIN C) 500 MG tablet Take 500 mg by mouth daily.        [name] aspirin 81 MG tablet Take 81 mg by mouth daily.        . Cholecalciferol (VITAMIN D) 1000 UNITS capsule Take 1,000 Units by mouth daily.        [name] diltiazem (CARDIZEM CD) 180 MG 24 hr capsule TAKE 1 CAPSULE DAILY  90 capsule  3  . furosemide (LASIX) 40 MG tablet TAKE 2 TABLETS BY MOUTH ONCE EVERY MORNING  180 tablet  3  .  levothyroxine (SYNTHROID, LEVOTHROID) 125 MCG tablet Take 1/2 tablet By mouth once daily  90 tablet  3  . omeprazole (PRILOSEC) 20 MG capsule Take 20 mg by mouth daily.        Vladimir Faster Glycol-Propyl Glycol (SYSTANE) 0.4-0.3 % SOLN One drop in each eye two times a day       . simvastatin (ZOCOR) 20 MG tablet TAKE 1 TAB BY MOUTH AT BEDTIME  90 tablet  3  . timolol (TIMOPTIC) 0.5 % ophthalmic solution Place 1 drop into both eyes 2 (two) times daily.        . traMADol (ULTRAM) 50 MG tablet Take 1 tablet (50 mg total) by mouth 3 (three) times daily as needed.  90 tablet  5  . triamcinolone (KENALOG) 0.025 % cream Apply 1 application topically Twice daily.        ROS: General: no fevers/chills/night sweats Eyes: no blurry vision, diplopia, or amaurosis ENT: no sore throat or hearing loss Resp: no cough, wheezing, or hemoptysis CV: no edema  or palpitations GI: no abdominal pain, nausea, vomiting, diarrhea, or constipation GU: no dysuria, frequency, or hematuria Skin: no rash Neuro: no headache, numbness, tingling, or weakness of extremities Musculoskeletal: no joint pain or swelling Heme: no bleeding, DVT, or easy bruising Endo: no polydipsia or polyuria    Physical Exam: Blood pressure 100/79, pulse 112, temperature 101.6 F (38.7 C), temperature source Oral, resp. rate 18, height 5\' 3"  (1.6 m), weight 138 lb 10.7 oz (62.9 kg), SpO2 92.00%.  Pt is alert and oriented, WD, WN, in no distress. HEENT:Biateral Carotid bruits Neck: JVP normal. Carotid upstrokes normal without bruits. No thyromegaly. Lungs: equal expansion, clear bilaterally CV: Apex is discrete and nondisplaced, RRR with soft outflow murmur Abd: soft, NT, +BS, no bruit, no hepatosplenomegaly Back: no CVA tenderness Ext: no C/C/E        Femoral pulses 2+= without bruits Skin: there were changes of cellulitis in both lower extremities Neuro: CNII-XII intact             Strength intact = bilaterally  Labs:   Lab Results  Component Value Date   WBC 10.8* 10/06/2013   HGB 9.9* 10/06/2013   HCT 30.0* 10/06/2013   MCV 93.5 10/06/2013   PLT 152 10/06/2013    Recent Labs Lab 10/06/13 0609  NA 138  K 4.2  CL 98  CO2 27  BUN 33*  CREATININE 1.41*  CALCIUM 8.7  PROT 6.0  BILITOT 6.0*  ALKPHOS 171*  ALT 228*  AST 481*  GLUCOSE 113*   Lab Results  Component Value Date   CKTOTAL 53 01/08/2011   TROPONINI <0.30 10/06/2013    Lab Results  Component Value Date   CHOL 191 02/06/2012   Lab Results  Component Value Date   HDL 82.20 02/06/2012   Lab Results  Component Value Date   LDLCALC 93 02/06/2012   Lab Results  Component Value Date   TRIG 81.0 02/06/2012   Lab Results  Component Value Date   CHOLHDL 2 02/06/2012   No results found for this basename: LDLDIRECT      Radiology: Dg Chest 2 View  10/06/2013   CLINICAL DATA:  Chest pain and  nausea.  EXAM: CHEST  2 VIEW  COMPARISON:  Chest radiograph December 19, 2010  FINDINGS: Cardiac silhouette remains mildly enlarged, mediastinal silhouette is nonsuspicious, mildly calcified aorta. No pleural effusions or focal consolidations. Nodular density seen in left lung base on prior radiograph is less conspicuous  today. No pneumothorax.  Patient is osteopenic.  Thoracolumbar dextroscoliosis.  IMPRESSION: Mild cardiomegaly, no acute pulmonary process.   Electronically Signed   By: Elon Alas   On: 10/06/2013 01:10   US Abdomen Complete  10/06/2013   CLINICAL DATA:  80 year old with right upper quadrant abdominal pain.  EXAM: ULTRASOUND ABDOMEN COMPLETE  COMPARISON:  CT abdomen and pelvis 10/09/2010.  FINDINGS: Gallbladder:  Moderately distended gallbladder containing shadowing gallstones, the largest approximating 2 cm. 1 cm gallstone in the neck of the gallbladder. Moderate echogenic sludge. No gallbladder wall thickening. Positive sonographic Murphy sign according to the ultrasound technologist.  Common bile duct:  Diameter: 16 mm. Approximate 0.8 cm gallstone in the distal common bile duct.  Liver:  Normal size and echotexture without focal parenchymal abnormality. Patent portal vein with hepatopetal flow. Mild intrahepatic biliary ductal dilation.  IVC:  Normal.  Pancreas:  Not visualized due to midline bowel gas.  Spleen:  Normal size and echotexture without focal parenchymal abnormality.  Right Kidney:  Length: Approximately 9.6 cm. Mild diffuse cortical thinning. No hydronephrosis. Multiple cysts as noted on the prior CT, the largest approximating 1.6 cm. No solid renal masses.  Left Kidney:  Length: Approximately 8.5 cm. Mild diffuse cortical thinning. No hydronephrosis. Multiple cysts as noted on the prior CT. No solid renal masses.  Abdominal aorta:  Not visualized due to midline bowel gas.  Other findings:  None.  IMPRESSION: 1. Distended gallbladder containing multiple gallstones, the  largest approximating 2 cm, including a 1 cm gallstone in the gallbladder neck. Positive sonographic Murphy sign according to the ultrasound technologist would be consistent with acute cholecystitis. 2. Obstructing 0.8 cm gallstone in the distal common bile duct. Maximum common bile duct diameter 16 mm. Mild intrahepatic biliary ductal dilation. 3. Cortical thinning involving both kidneys and bilateral renal cysts. 4. Nonvisualization of the pancreas and the abdominal aorta due to the midline bowel gas.   Electronically Signed   By: Evangeline Dakin M.D.   On: 10/06/2013 01:37    IOX:BDZHGD sinus rhythm without ST or T wave changes  ASSESSMENT AND PLAN: Mr. Norton has acceptable preoperative cardiovascular risk given her each and otherwise lack of cardiovascular risk factors. She did have an uncomplicated total knee replacement at age 26. She has normal LV systolic function and is otherwise asymptomatic. She does have symptomatic cholelithiasis/ cholecystitis. She scheduled for ERCP tomorrow. Should she require cholecystectomy either laparoscopic or open I do not think she needs any further preoperative cardiovascular diagnostic tests we will be happy to follow her postoperatively.  Lorretta Harp, M.D., [place], [facility], [place] [address] [place], [place]  [address]  [phone] 10/06/2013 10:31 AM

## 2013-10-06 NOTE — H&P (Signed)
PCP:   Noralee Space, MD   Chief Complaint:  abd pain  HPI: 80 yo female highly functioinal lives at home started having ruq abd pain last night.  She took some tums and a xanax, the pain subsided.  She woke up this am, ate a little bit for breakfast.  Ate a small lunch.  She had no pain all day.  Then after supper she ate and several hours later she started with the same severe sudden onset ruq pain.  No n/v.  The pain persisted so came to ED.  No fevers.  Still has gallbladder.  Pain was 10/10 on arrival , down to 5/5 with morphine given.  She has had no problems with her gallbladder prior to last night (over 24 hours ago).  No jaundice.  Her only child who is a daughter is present with her tonight in the ED.  Review of Systems:  Positive and negative as per HPI otherwise all other systems are negative  Past Medical History: Past Medical History  Diagnosis Date  . Hypertension   . Hypothyroidism   . Glaucoma   . Acute bronchitis   . Palpitations   . Unspecified venous (peripheral) insufficiency   . Pure hypercholesterolemia   . GERD (gastroesophageal reflux disease)   . Diverticulosis of colon (without mention of hemorrhage)   . Benign neoplasm of colon   . Renal insufficiency   . DJD (degenerative joint disease)   . Vitamin D deficiency disease   . Anxiety state, unspecified    Past Surgical History  Procedure Laterality Date  . Cataract extraction    . Abdominal hysterectomy    . Breast biopsy      Benign  . Total knee arthroplasty  1999    right  . Total knee arthroplasty  11/2008    left - Dr Lorin Mercy  . Toe amputation  08/2009    Right second toe - Dr Beola Cord    Medications: Prior to Admission medications   Medication Sig Start Date End Date Taking? Authorizing Provider  acetaminophen (TYLENOL) 325 MG tablet Per bottle as needed   Yes Historical Provider, MD  allopurinol (ZYLOPRIM) 100 MG tablet Take 100 mg by mouth daily.     Yes Historical Provider, MD   ALPRAZolam (XANAX) 0.25 MG tablet TAKE 1/2 TO 1 TABLET 3 TIMES A DAY AS NEEDED 07/12/13  Yes Noralee Space, MD  Ascorbic Acid (VITAMIN C) 500 MG tablet Take 500 mg by mouth daily.     Yes Historical Provider, MD  aspirin 81 MG tablet Take 81 mg by mouth daily.     Yes Historical Provider, MD  Cholecalciferol (VITAMIN D) 1000 UNITS capsule Take 1,000 Units by mouth daily.     Yes Historical Provider, MD  diltiazem (CARDIZEM CD) 180 MG 24 hr capsule TAKE 1 CAPSULE DAILY 08/16/13  Yes Noralee Space, MD  furosemide (LASIX) 40 MG tablet TAKE 2 TABLETS BY MOUTH ONCE EVERY MORNING 07/29/13  Yes Noralee Space, MD  levothyroxine (SYNTHROID, LEVOTHROID) 125 MCG tablet Take 1/2 tablet By mouth once daily 02/12/12  Yes Noralee Space, MD  omeprazole (PRILOSEC) 20 MG capsule Take 20 mg by mouth daily.     Yes Historical Provider, MD  Polyethyl Glycol-Propyl Glycol (SYSTANE) 0.4-0.3 % SOLN One drop in each eye two times a day    Yes Historical Provider, MD  simvastatin (ZOCOR) 20 MG tablet TAKE 1 TAB BY MOUTH AT BEDTIME 06/28/13  Yes Noralee Space, MD  timolol (TIMOPTIC) 0.5 % ophthalmic solution Place 1 drop into both eyes 2 (two) times daily.     Yes Historical Provider, MD  traMADol (ULTRAM) 50 MG tablet Take 1 tablet (50 mg total) by mouth 3 (three) times daily as needed. 08/16/13  Yes Noralee Space, MD  triamcinolone (KENALOG) 0.025 % cream Apply 1 application topically Twice daily. 09/11/12  Yes Historical Provider, MD    Allergies:   Allergies  Allergen Reactions  . Enablex [Darifenacin Hydrobromide Er]     Caused her throat and mouth to have bumps and feel like it was swelling  . Clarithromycin Swelling    REACTION: causes her mouth to swell  . Codeine Other (See Comments)    Makes pt nervous  . Morphine Nausea And Vomiting    REACTION: n/v  . Sulfonamide Derivatives Other (See Comments)    REACTION: unsure of reaction    Social History:  reports that she has never smoked. She has never used  smokeless tobacco. She reports that she does not drink alcohol or use illicit drugs.  Family History: Family History  Problem Relation Age of Onset  . Heart disease Mother   . Cancer Father     Throat  . Heart disease Father     Physical Exam: Filed Vitals:   10/05/13 2331 10/05/13 2353  BP: 113/94 116/91  Pulse: 88   Temp: 97.4 F (36.3 C)   TempSrc: Oral   Resp: 18 11  SpO2: 98%    General appearance: alert, cooperative and no distress  No jaundice Head: Normocephalic, without obvious abnormality, atraumatic Eyes: negative Nose: Nares normal. Septum midline. Mucosa normal. No drainage or sinus tenderness. Neck: no JVD and supple, symmetrical, trachea midline Lungs: clear to auscultation bilaterally Heart: regular rate and rhythm, S1, S2 normal, no murmur, click, rub or gallop Abdomen: soft nd ttp epi and ruq pos bs no r/g nonacute abd Extremities: extremities normal, atraumatic, no cyanosis or edema Pulses: 2+ and symmetric Skin: Skin color, texture, turgor normal. No rashes or lesions Neurologic: Grossly normal    Labs on Admission:   Recent Labs  10/06/13 0019  NA 133*  K 4.0  CL 92*  CO2 29  GLUCOSE 124*  BUN 36*  CREATININE 1.59*  CALCIUM 9.1    Recent Labs  10/06/13 0019  AST 167*  ALT 86*  ALKPHOS 129*  BILITOT 2.2*  PROT 7.1  ALBUMIN 3.6    Recent Labs  10/06/13 0019  LIPASE 22    Recent Labs  10/06/13 0019  WBC 9.7  NEUTROABS 7.1  HGB 10.1*  HCT 31.2*  MCV 95.4  PLT 190    Recent Labs  10/06/13 0019  TROPONINI <0.30   Radiological Exams on Admission: Dg Chest 2 View  10/06/2013   CLINICAL DATA:  Chest pain and nausea.  EXAM: CHEST  2 VIEW  COMPARISON:  Chest radiograph December 19, 2010  FINDINGS: Cardiac silhouette remains mildly enlarged, mediastinal silhouette is nonsuspicious, mildly calcified aorta. No pleural effusions or focal consolidations. Nodular density seen in left lung base on prior radiograph is less  conspicuous today. No pneumothorax.  Patient is osteopenic.  Thoracolumbar dextroscoliosis.  IMPRESSION: Mild cardiomegaly, no acute pulmonary process.   Electronically Signed   By: Elon Alas   On: 10/06/2013 01:10   US Abdomen Complete  10/06/2013   CLINICAL DATA:  80 year old with right upper quadrant abdominal pain.  EXAM: ULTRASOUND ABDOMEN COMPLETE  COMPARISON:  CT abdomen and pelvis 10/09/2010.  FINDINGS:  Gallbladder:  Moderately distended gallbladder containing shadowing gallstones, the largest approximating 2 cm. 1 cm gallstone in the neck of the gallbladder. Moderate echogenic sludge. No gallbladder wall thickening. Positive sonographic Murphy sign according to the ultrasound technologist.  Common bile duct:  Diameter: 16 mm. Approximate 0.8 cm gallstone in the distal common bile duct.  Liver:  Normal size and echotexture without focal parenchymal abnormality. Patent portal vein with hepatopetal flow. Mild intrahepatic biliary ductal dilation.  IVC:  Normal.  Pancreas:  Not visualized due to midline bowel gas.  Spleen:  Normal size and echotexture without focal parenchymal abnormality.  Right Kidney:  Length: Approximately 9.6 cm. Mild diffuse cortical thinning. No hydronephrosis. Multiple cysts as noted on the prior CT, the largest approximating 1.6 cm. No solid renal masses.  Left Kidney:  Length: Approximately 8.5 cm. Mild diffuse cortical thinning. No hydronephrosis. Multiple cysts as noted on the prior CT. No solid renal masses.  Abdominal aorta:  Not visualized due to midline bowel gas.  Other findings:  None.  IMPRESSION: 1. Distended gallbladder containing multiple gallstones, the largest approximating 2 cm, including a 1 cm gallstone in the gallbladder neck. Positive sonographic Murphy sign according to the ultrasound technologist would be consistent with acute cholecystitis. 2. Obstructing 0.8 cm gallstone in the distal common bile duct. Maximum common bile duct diameter 16 mm. Mild  intrahepatic biliary ductal dilation. 3. Cortical thinning involving both kidneys and bilateral renal cysts. 4. Nonvisualization of the pancreas and the abdominal aorta due to the midline bowel gas.   Electronically Signed   By: Evangeline Dakin M.D.   On: 10/06/2013 01:37    Assessment/Plan  80 yo female with choledocholithiasis with obstruction of CBD  Principal Problem:   Choledocholithiasis-  Place pt npo for probably ERCP in am.  GI and general surgery have been called for consultation.  Place on iv cipro.  Pt is very worried about undergoing any procedures due to her age.  She is open to ERCP but not really sure about cholecystectomy.  Encouraged her to speak to gi and gen surgery physicians in am.  Will keep npo in case for ERCP in am.  i have advised that the risk of doing nothing could also be great, turn into serious infection with obs stone, she understands.  ivf and iv morphine/zofran prn.    Active Problems:   HYPOTHYROIDISM   HYPERTENSION   RENAL INSUFFICIENCY   Aortic valve disorder   Abdominal pain, acute, right upper quadrant    Jeanenne Licea A 10/06/2013, 2:33 AM

## 2013-10-06 NOTE — Anesthesia Preprocedure Evaluation (Addendum)
Anesthesia Evaluation  Patient identified by MRN, date of birth, ID band Patient awake    Reviewed: Allergy & Precautions, H&P , NPO status , Patient's Chart, lab work & pertinent test results  History of Anesthesia Complications Negative for: history of anesthetic complications  Airway Mallampati: II TM Distance: >3 FB Neck ROM: Full    Dental  (+) Teeth Intact   Pulmonary neg COPDneg recent URI,    Pulmonary exam normal       Cardiovascular Exercise Tolerance: Poor hypertension, Pt. on medications - angina+ Peripheral Vascular Disease - Past MI - dysrhythmias - Valvular Problems/MurmursRhythm:Regular Rate:Normal     Neuro/Psych Anxiety negative neurological ROS     GI/Hepatic GERD-  Medicated and Controlled,Obstructive bile duct with jaundice.    Endo/Other  neg diabetesHypothyroidism   Renal/GU Renal InsufficiencyRenal disease  negative genitourinary   Musculoskeletal  (+) Arthritis -, Osteoarthritis,    Abdominal   Peds  Hematology  (+) anemia ,   Anesthesia Other Findings   Reproductive/Obstetrics                          Anesthesia Physical Anesthesia Plan  ASA: III  Anesthesia Plan: General   Post-op Pain Management:    Induction: Intravenous  Airway Management Planned: Oral ETT  Additional Equipment: None  Intra-op Plan:   Post-operative Plan: Extubation in OR  Informed Consent: I have reviewed the patients History and Physical, chart, labs and discussed the procedure including the risks, benefits and alternatives for the proposed anesthesia with the patient or authorized representative who has indicated his/her understanding and acceptance.   Dental advisory given  Plan Discussed with: CRNA, Surgeon and Anesthesiologist  Anesthesia Plan Comments:        Anesthesia Quick Evaluation

## 2013-10-06 NOTE — Consult Note (Signed)
Reason for Consult:Cholecystitis, choledocholithiasis Referring Physician: Lucrezia Starch is an 80 y.o. female.  HPI: Patient developed right quadrant pain yesterday. It was associated with anorexia and nausea. She did not have any vomiting. The pain continued and she was evaluated in the emergency department. Workup Included laboratory studies showing elevated liver function tests. Abdominal ultrasound revealed gallstones including a stone in the neck of the gallbladder, sonographic Murphy's sign, and a stone visualized in her common bile duct. We are asked to see her from a surgery standpoint to assist in management. She has been admitted by the medical service. Her pain is improved after receiving pain medication. She has never had a pain like this in the past.  Past Medical History  Diagnosis Date  . Hypertension   . Hypothyroidism   . Glaucoma   . Acute bronchitis   . Palpitations   . Unspecified venous (peripheral) insufficiency   . Pure hypercholesterolemia   . GERD (gastroesophageal reflux disease)   . Diverticulosis of colon (without mention of hemorrhage)   . Benign neoplasm of colon   . Renal insufficiency   . DJD (degenerative joint disease)   . Vitamin D deficiency disease   . Anxiety state, unspecified     Past Surgical History  Procedure Laterality Date  . Cataract extraction    . Abdominal hysterectomy    . Breast biopsy      Benign  . Total knee arthroplasty  1999    right  . Total knee arthroplasty  11/2008    left - Dr Lorin Mercy  . Toe amputation  08/2009    Right second toe - Dr Beola Cord    Family History  Problem Relation Age of Onset  . Heart disease Mother   . Cancer Father     Throat  . Heart disease Father     Social History:  reports that she has never smoked. She has never used smokeless tobacco. She reports that she does not drink alcohol or use illicit drugs.  Allergies:  Allergies  Allergen Reactions  . Enablex  [Darifenacin Hydrobromide Er]     Caused her throat and mouth to have bumps and feel like it was swelling  . Clarithromycin Swelling    REACTION: causes her mouth to swell  . Codeine Other (See Comments)    Makes pt nervous  . Morphine Nausea And Vomiting    REACTION: n/v  . Sulfonamide Derivatives Other (See Comments)    REACTION: unsure of reaction    Medications:  Prior to Admission:  Prescriptions prior to admission  Medication Sig Dispense Refill  . acetaminophen (TYLENOL) 325 MG tablet Per bottle as needed      . allopurinol (ZYLOPRIM) 100 MG tablet Take 100 mg by mouth daily.        [name] ALPRAZolam (XANAX) 0.25 MG tablet TAKE 1/2 TO 1 TABLET 3 TIMES A DAY AS NEEDED  90 tablet  3  . Ascorbic Acid (VITAMIN C) 500 MG tablet Take 500 mg by mouth daily.        [name] aspirin 81 MG tablet Take 81 mg by mouth daily.        . Cholecalciferol (VITAMIN D) 1000 UNITS capsule Take 1,000 Units by mouth daily.        [name] diltiazem (CARDIZEM CD) 180 MG 24 hr capsule TAKE 1 CAPSULE DAILY  90 capsule  3  . furosemide (LASIX) 40 MG tablet TAKE 2 TABLETS BY MOUTH ONCE EVERY MORNING  180 tablet  3  . levothyroxine (SYNTHROID, LEVOTHROID) 125 MCG tablet Take 1/2 tablet By mouth once daily  90 tablet  3  . omeprazole (PRILOSEC) 20 MG capsule Take 20 mg by mouth daily.        Vladimir Faster Glycol-Propyl Glycol (SYSTANE) 0.4-0.3 % SOLN One drop in each eye two times a day       . simvastatin (ZOCOR) 20 MG tablet TAKE 1 TAB BY MOUTH AT BEDTIME  90 tablet  3  . timolol (TIMOPTIC) 0.5 % ophthalmic solution Place 1 drop into both eyes 2 (two) times daily.        . traMADol (ULTRAM) 50 MG tablet Take 1 tablet (50 mg total) by mouth 3 (three) times daily as needed.  90 tablet  5  . triamcinolone (KENALOG) 0.025 % cream Apply 1 application topically Twice daily.        Results for orders placed during the hospital encounter of 10/05/13 (from the past 48 hour(s))  CBC WITH DIFFERENTIAL     Status: Abnormal    Collection Time    10/06/13 12:19 AM      Result Value Range   WBC 9.7  4.0 - 10.5 K/uL   RBC 3.27 (*) 3.87 - 5.11 MIL/uL   Hemoglobin 10.1 (*) 12.0 - 15.0 g/dL   HCT 31.2 (*) 36.0 - 46.0 %   MCV 95.4  78.0 - 100.0 fL   MCH 30.9  26.0 - 34.0 pg   MCHC 32.4  30.0 - 36.0 g/dL   RDW 16.1 (*) 11.5 - 15.5 %   Platelets 190  150 - 400 K/uL   Neutrophils Relative % 73  43 - 77 %   Neutro Abs 7.1  1.7 - 7.7 K/uL   Lymphocytes Relative 16  12 - 46 %   Lymphs Abs 1.6  0.7 - 4.0 K/uL   Monocytes Relative 8  3 - 12 %   Monocytes Absolute 0.7  0.1 - 1.0 K/uL   Eosinophils Relative 2  0 - 5 %   Eosinophils Absolute 0.2  0.0 - 0.7 K/uL   Basophils Relative 1  0 - 1 %   Basophils Absolute 0.1  0.0 - 0.1 K/uL  COMPREHENSIVE METABOLIC PANEL     Status: Abnormal   Collection Time    10/06/13 12:19 AM      Result Value Range   Sodium 133 (*) 137 - 147 mEq/L   Potassium 4.0  3.7 - 5.3 mEq/L   Chloride 92 (*) 96 - 112 mEq/L   CO2 29  19 - 32 mEq/L   Glucose, Bld 124 (*) 70 - 99 mg/dL   BUN 36 (*) 6 - 23 mg/dL   Creatinine, Ser 1.59 (*) 0.50 - 1.10 mg/dL   Calcium 9.1  8.4 - 10.5 mg/dL   Total Protein 7.1  6.0 - 8.3 g/dL   Albumin 3.6  3.5 - 5.2 g/dL   AST 167 (*) 0 - 37 U/L   ALT 86 (*) 0 - 35 U/L   Alkaline Phosphatase 129 (*) 39 - 117 U/L   Total Bilirubin 2.2 (*) 0.3 - 1.2 mg/dL   GFR calc non Af Amer 26 (*) >90 mL/min   GFR calc Af Amer 30 (*) >90 mL/min   Comment: (NOTE)     The eGFR has been calculated using the CKD EPI equation.     This calculation has not been validated in all clinical situations.     eGFR's persistently <90 mL/min signify possible  Chronic Kidney     Disease.  PRO B NATRIURETIC PEPTIDE     Status: None   Collection Time    10/06/13 12:19 AM      Result Value Range   Pro B Natriuretic peptide (BNP) 329.3  0 - 450 pg/mL  TROPONIN I     Status: None   Collection Time    10/06/13 12:19 AM      Result Value Range   Troponin I <0.30  <0.30 ng/mL   Comment:             Due to the release kinetics of cTnI,     a negative result within the first hours     of the onset of symptoms does not rule out     myocardial infarction with certainty.     If myocardial infarction is still suspected,     repeat the test at appropriate intervals.  LIPASE, BLOOD     Status: None   Collection Time    10/06/13 12:19 AM      Result Value Range   Lipase 22  11 - 59 U/L    Dg Chest 2 View  10/06/2013   CLINICAL DATA:  Chest pain and nausea.  EXAM: CHEST  2 VIEW  COMPARISON:  Chest radiograph December 19, 2010  FINDINGS: Cardiac silhouette remains mildly enlarged, mediastinal silhouette is nonsuspicious, mildly calcified aorta. No pleural effusions or focal consolidations. Nodular density seen in left lung base on prior radiograph is less conspicuous today. No pneumothorax.  Patient is osteopenic.  Thoracolumbar dextroscoliosis.  IMPRESSION: Mild cardiomegaly, no acute pulmonary process.   Electronically Signed   By: Elon Alas   On: 10/06/2013 01:10   US Abdomen Complete  10/06/2013   CLINICAL DATA:  80 year old with right upper quadrant abdominal pain.  EXAM: ULTRASOUND ABDOMEN COMPLETE  COMPARISON:  CT abdomen and pelvis 10/09/2010.  FINDINGS: Gallbladder:  Moderately distended gallbladder containing shadowing gallstones, the largest approximating 2 cm. 1 cm gallstone in the neck of the gallbladder. Moderate echogenic sludge. No gallbladder wall thickening. Positive sonographic Murphy sign according to the ultrasound technologist.  Common bile duct:  Diameter: 16 mm. Approximate 0.8 cm gallstone in the distal common bile duct.  Liver:  Normal size and echotexture without focal parenchymal abnormality. Patent portal vein with hepatopetal flow. Mild intrahepatic biliary ductal dilation.  IVC:  Normal.  Pancreas:  Not visualized due to midline bowel gas.  Spleen:  Normal size and echotexture without focal parenchymal abnormality.  Right Kidney:  Length: Approximately 9.6 cm.  Mild diffuse cortical thinning. No hydronephrosis. Multiple cysts as noted on the prior CT, the largest approximating 1.6 cm. No solid renal masses.  Left Kidney:  Length: Approximately 8.5 cm. Mild diffuse cortical thinning. No hydronephrosis. Multiple cysts as noted on the prior CT. No solid renal masses.  Abdominal aorta:  Not visualized due to midline bowel gas.  Other findings:  None.  IMPRESSION: 1. Distended gallbladder containing multiple gallstones, the largest approximating 2 cm, including a 1 cm gallstone in the gallbladder neck. Positive sonographic Murphy sign according to the ultrasound technologist would be consistent with acute cholecystitis. 2. Obstructing 0.8 cm gallstone in the distal common bile duct. Maximum common bile duct diameter 16 mm. Mild intrahepatic biliary ductal dilation. 3. Cortical thinning involving both kidneys and bilateral renal cysts. 4. Nonvisualization of the pancreas and the abdominal aorta due to the midline bowel gas.   Electronically Signed   By: Sherran Needs.D.  On: 10/06/2013 01:37    Review of Systems  Constitutional: Positive for malaise/fatigue.  HENT: Negative.        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   Cardiovascular: Negative.   Gastrointestinal: Positive for nausea and abdominal pain. Negative for vomiting, diarrhea and constipation.  Genitourinary: Negative.   Musculoskeletal:       Redness and swelling of bilateral calves since last November  Skin: Negative.   Neurological: Negative.   Endo/Heme/Allergies: Negative.   Psychiatric/Behavioral: Negative.    Blood pressure 183/85, pulse 113, temperature 97.5 F (36.4 C), temperature source Oral, resp. rate 11, height '5\' 3"'  (1.6 m), weight 138 lb 10.7 oz (62.9 kg), SpO2 92.00%. Physical Exam  Constitutional: She is oriented to person, place, and time. She appears  well-developed and well-nourished. No distress.  HENT:  Head: Normocephalic.  Mouth/Throat: Oropharynx is clear and moist.  Eyes: EOM are normal. Right eye exhibits no discharge. Left eye exhibits no discharge.  Neck: Normal range of motion. No tracheal deviation present.  Cardiovascular: Normal rate, regular rhythm and normal heart sounds.   Respiratory: Effort normal and breath sounds normal. No stridor. No respiratory distress. She has no wheezes. She has no rales.  GI: Soft. She exhibits no distension. There is tenderness. There is no rebound and no guarding.  Hypoactive bowel sounds, tenderness right upper quadrant without guarding, no generalized tenderness, no peritonitis  Musculoskeletal: She exhibits edema and tenderness.  Bilateral calves with pitting edema and erythema  Neurological: She is alert and oriented to person, place, and time. She exhibits normal muscle tone.  Skin: Skin is warm.  Psychiatric: She has a normal mood and affect.    Assessment/Plan: Gallstones with cholecystitis and choledocholithiasis. Agree with medical admission, bowel rest, and IV antibiotics. Gastroenterology consultation is planned for possible ERCP. Patient is somewhat reluctant to undergo surgery but we will discuss things further with her after gastroenterology evaluation. I answered her questions and we will follow closely.  Laiba Fuerte E 10/06/2013, 5:00 AM

## 2013-10-06 NOTE — Op Note (Signed)
[facility] [place], [id number]   ERCP PROCEDURE REPORT  PATIENT: [name], [name]  MR# :[id number] BIRTHDATE: 1915-08-08  GENDER: Female ENDOSCOPIST: Ladene Artist, MD, [facility] REFERRED BY: [facility] PROCEDURE DATE:  10/06/2013 PROCEDURE:   ERCP with sphincterotomy/papillotomy and ERCP with removal of calculus/calculi ASA CLASS:   Class III INDICATIONS:abdominal pain in upper right quadrant.   abnormal abdominal CT.   abnormal liver function test. MEDICATIONS: General endotracheal anesthesia (GETA) TOPICAL ANESTHETIC: none DESCRIPTION OF PROCEDURE:   After the risks benefits and alternatives of the procedure were thoroughly explained, informed consent was obtained.  The Pentax ERCP [id number]  endoscope was introduced through the mouth  and advanced to the second portion of the duodenum . 1.  Two stones 6-9 mm were seen in the common bile duct.  Normal ampulla. Sphincterotomy performed for stone extraction. 2.  Using a stone extraction balloon the bile duct was swept several times.  Two stones and some sludge were removed from the bile duct successfully. 3.  There was dilation of the common bile duct and intrahepatic ducts. PD not injected or cannulated by intention. The scope was then completely withdrawn from the patient and the procedure completed.    COMPLICATIONS: .  There were no complications.  ENDOSCOPIC IMPRESSION: 1.   Two stones in the common bile duct; sphincterotomy and stone extraction performed 2.   Dilation of the common bile duct and intrahepatic ducts  RECOMMENDATIONS: 1.  Liver enzymes   eSigned:  Ladene Artist, MD, Marval Regal 10/06/2013 4:20 PM   VE:[facility] Sharlett Iles, MD

## 2013-10-06 NOTE — Preoperative (Signed)
Beta Blockers   Reason not to administer Beta Blockers:Not Applicable 

## 2013-10-06 NOTE — ED Notes (Signed)
Pt states that she is having chest pain that has not been relieved by pepto.  Pt states it feels like she just cant get a full breath.

## 2013-10-06 NOTE — Progress Notes (Signed)
ANTIBIOTIC CONSULT NOTE - INITIAL  Pharmacy Consult:  Vancomycin Indication:  Cellulitis  Allergies  Allergen Reactions  . Enablex [Darifenacin Hydrobromide Er]     Caused her throat and mouth to have bumps and feel like it was swelling  . Clarithromycin Swelling    REACTION: causes her mouth to swell  . Codeine Other (See Comments)    Makes pt nervous  . Morphine Nausea And Vomiting    REACTION: n/v  . Sulfonamide Derivatives Other (See Comments)    REACTION: unsure of reaction    Patient Measurements: Height: 5\' 3"  (160 cm) Weight: 138 lb 10.7 oz (62.9 kg) IBW/kg (Calculated) : 52.4  Vital Signs: Temp: 98 F (36.7 C) (01/14 0632) Temp src: Oral (01/14 0632) BP: 178/84 mmHg (01/14 0632) Pulse Rate: 106 (01/14 0632)  Labs:  Recent Labs  10/06/13 0019 10/06/13 0609  WBC 9.7 10.8*  HGB 10.1* 9.9*  PLT 190 152  CREATININE 1.59* 1.41*   Estimated Creatinine Clearance: 20.9 ml/min (by C-G formula based on Cr of 1.41). No results found for this basename: VANCOTROUGH, VANCOPEAK, VANCORANDOM, GENTTROUGH, GENTPEAK, GENTRANDOM, TOBRATROUGH, TOBRAPEAK, TOBRARND, AMIKACINPEAK, AMIKACINTROU, AMIKACIN,  in the last 72 hours   Microbiology: No results found for this or any previous visit (from the past 720 hour(s)).  Medical History: Past Medical History  Diagnosis Date  . Hypertension   . Hypothyroidism   . Glaucoma   . Acute bronchitis   . Palpitations   . Unspecified venous (peripheral) insufficiency   . Pure hypercholesterolemia   . GERD (gastroesophageal reflux disease)   . Diverticulosis of colon (without mention of hemorrhage)   . Benign neoplasm of colon   . Renal insufficiency   . DJD (degenerative joint disease)   . Vitamin D deficiency disease   . Anxiety state, unspecified   . CKD (chronic kidney disease), stage III 10/06/2013      Assessment: 74 YOF admitted with abdominal pain and found to have choledocholithiasis and bilateral LE cellulitis.  She  is already started on Cipro to cover for GI source, now to add vancomycin for cellulitis.  She has CKD at baseline.  Vanc 1/14 >> Cipro 1/14 >>   Goal of Therapy:  Vancomycin trough level 10-15 mcg/ml   Plan:  - Vanc 1000mg  IV x 1, then 500mg  IV Q24H - Cipro 400mg  IV Q24H as ordered - Monitor renal fxn, vanc trough as indicated    Leetta Hendriks D. Mina Marble, PharmD, BCPS Pager:  [phone] 10/06/2013, 10:01 AM

## 2013-10-06 NOTE — Transfer of Care (Signed)
Immediate Anesthesia Transfer of Care Note  Patient: [name]  Procedure(s) Performed: Procedure(s): ENDOSCOPIC RETROGRADE CHOLANGIOPANCREATOGRAPHY (ERCP) (N/A)  Patient Location: PACU  Anesthesia Type:General  Level of Consciousness: awake, alert , oriented and patient cooperative  Airway & Oxygen Therapy: Patient Spontanous Breathing and Patient connected to nasal cannula oxygen  Post-op Assessment: Report given to PACU RN, Post -op Vital signs reviewed and stable and Patient moving all extremities X 4  Post vital signs: Reviewed and stable  Complications: No apparent anesthesia complications

## 2013-10-06 NOTE — Progress Notes (Signed)
UR complete.  Saydee Zolman RN, MSN 

## 2013-10-06 NOTE — ED Provider Notes (Signed)
CSN: [id number]     Arrival date & time 10/05/13  2322 History   First MD Initiated Contact with Patient 10/05/13 2344     Chief Complaint  Patient presents with  . Chest Pain   (Consider location/radiation/quality/duration/timing/severity/associated sxs/prior Treatment) HPI Patient presents with right lower chest/right upper abdomen pain x2 days. She states the pain started last night and was associated with nausea. She took several medications including Pepto-Bismol and TUMS with improvement of symptoms. Patient says the symptoms returned this evening around 9:30 is lying on the couch. The pain is worse with deep inspiration. She has no cough, fever or chills. Denies any vomiting or diarrhea. She took Pepto-Bismol this evening with little relief. Past Medical History  Diagnosis Date  . Hypertension   . Hypothyroidism   . Glaucoma   . Acute bronchitis   . Palpitations   . Unspecified venous (peripheral) insufficiency   . Pure hypercholesterolemia   . GERD (gastroesophageal reflux disease)   . Diverticulosis of colon (without mention of hemorrhage)   . Benign neoplasm of colon   . Renal insufficiency   . DJD (degenerative joint disease)   . Vitamin D deficiency disease   . Anxiety state, unspecified    Past Surgical History  Procedure Laterality Date  . Cataract extraction    . Abdominal hysterectomy    . Breast biopsy      Benign  . Total knee arthroplasty  1999    right  . Total knee arthroplasty  11/2008    left - Dr Lorin Mercy  . Toe amputation  08/2009    Right second toe - Dr Beola Cord   Family History  Problem Relation Age of Onset  . Heart disease Mother   . Cancer Father     Throat  . Heart disease Father    History  Substance Use Topics  . Smoking status: Never Smoker   . Smokeless tobacco: Never Used  . Alcohol Use: No   OB History   Grav Para Term Preterm Abortions TAB SAB Ect Mult Living                 Review of Systems  Constitutional: Negative for  fever and chills.  Respiratory: Negative for cough and shortness of breath.   Cardiovascular: Positive for chest pain and leg swelling. Negative for palpitations.  Gastrointestinal: Positive for nausea and abdominal pain. Negative for vomiting, diarrhea and constipation.  Genitourinary: Negative for dysuria and flank pain.  Musculoskeletal: Negative for back pain, neck pain and neck stiffness.  Skin: Negative for rash and wound.  Neurological: Negative for dizziness, weakness, numbness and headaches.  All other systems reviewed and are negative.    Allergies  Enablex; Clarithromycin; Codeine; Morphine; and Sulfonamide derivatives  Home Medications   Current Outpatient Rx  Name  Route  Sig  Dispense  Refill  . acetaminophen (TYLENOL) 325 MG tablet      Per bottle as needed         . allopurinol (ZYLOPRIM) 100 MG tablet   Oral   Take 100 mg by mouth daily.           [name] ALPRAZolam (XANAX) 0.25 MG tablet      TAKE 1/2 TO 1 TABLET 3 TIMES A DAY AS NEEDED   90 tablet   3   . Ascorbic Acid (VITAMIN C) 500 MG tablet   Oral   Take 500 mg by mouth daily.           [name]  aspirin 81 MG tablet   Oral   Take 81 mg by mouth daily.           . Cholecalciferol (VITAMIN D) 1000 UNITS capsule   Oral   Take 1,000 Units by mouth daily.           [name] diltiazem (CARDIZEM CD) 180 MG 24 hr capsule      TAKE 1 CAPSULE DAILY   90 capsule   3   . furosemide (LASIX) 40 MG tablet      TAKE 2 TABLETS BY MOUTH ONCE EVERY MORNING   180 tablet   3   . guaiFENesin (MUCINEX) 600 MG 12 hr tablet   Oral   Take 1,200 mg by mouth 2 (two) times daily as needed.          [name] HYDROcodone-acetaminophen (VICODIN) 5-500 MG per tablet      Take 1/2 to 1 By mouth three times a day as needed for pain   90 tablet   5   . levothyroxine (SYNTHROID, LEVOTHROID) 125 MCG tablet      Take 1/2 tablet By mouth once daily   90 tablet   3   . omeprazole (PRILOSEC) 20 MG capsule   Oral   Take 20 mg  by mouth daily.           Vladimir Faster Glycol-Propyl Glycol (SYSTANE) 0.4-0.3 % SOLN      One drop in each eye two times a day          . simvastatin (ZOCOR) 20 MG tablet      TAKE 1 TAB BY MOUTH AT BEDTIME   90 tablet   3   . timolol (TIMOPTIC) 0.5 % ophthalmic solution   Both Eyes   Place 1 drop into both eyes 2 (two) times daily.           . traMADol (ULTRAM) 50 MG tablet   Oral   Take 1 tablet (50 mg total) by mouth 3 (three) times daily as needed.   90 tablet   5   . triamcinolone (KENALOG) 0.025 % cream   Topical   Apply 1 application topically Twice daily.          BP 116/91  Pulse 88  Temp(Src) 97.4 F (36.3 C) (Oral)  Resp 11  SpO2 98% Physical Exam  Nursing note and vitals reviewed. Constitutional: She is oriented to person, place, and time. She appears well-developed and well-nourished. No distress.  HENT:  Head: Normocephalic and atraumatic.  Mouth/Throat: Oropharynx is clear and moist.  Eyes: EOM are normal. Pupils are equal, round, and reactive to light.  Neck: Normal range of motion. Neck supple.  Cardiovascular: Normal rate and regular rhythm.   Pulmonary/Chest: Effort normal and breath sounds normal. No respiratory distress. She has no wheezes. She has no rales. She exhibits no tenderness.  Abdominal: Soft. Bowel sounds are normal. She exhibits no distension and no mass. There is tenderness (Right upper quadrant tenderness with palpation.). There is no rebound and no guarding.  Musculoskeletal: Normal range of motion. She exhibits no edema and no tenderness.  Neurological: She is alert and oriented to person, place, and time.  2+ bilateral pitting edema to knees. No calf tenderness.  Skin: Skin is warm and dry. No rash noted. No erythema.  Psychiatric: She has a normal mood and affect. Her behavior is normal.    ED Course  Procedures (including critical care time) Labs Review Labs Reviewed  CBC WITH DIFFERENTIAL  COMPREHENSIVE METABOLIC  PANEL  PRO B NATRIURETIC PEPTIDE  TROPONIN I  LIPASE, BLOOD   Imaging Review No results found.  EKG Interpretation    Date/Time:  Tuesday October 05 2013 23:26:29 EST Ventricular Rate:  92 PR Interval:  180 QRS Duration: 98 QT Interval:  372 QTC Calculation: 460 R Axis:   11 Text Interpretation:  Sinus rhythm with Premature atrial complexes with Abberant conduction Low voltage QRS Nonspecific ST abnormality Abnormal ECG Confirmed by Maridee Slape  MD, Shaia Porath ([id number]) on 10/05/2013 11:57:30 PM            MDM  No diagnosis found.   Discussed with general surgery who will see the patient in emergency department. [facility] will see the patient and admit. I discussed with Dr. Deatra Ina who will consult on the patient in the morning. Asked to have the patient started on Cipro 400 mg twice a day.  Julianne Rice, MD 10/06/13 [phone]

## 2013-10-06 NOTE — Progress Notes (Signed)
MD paged to request order to begin diet.  Orders given to start on clear liquid diet and advance as tolerated.  Will continue to monitor patient.

## 2013-10-06 NOTE — Consult Note (Signed)
[name]: 8:28 AM 10/06/2013  LOS: 1 day    Referring Provider:  Dr Grandville Silos Primary Care Physician:  Noralee Space, MD Primary Gastroenterologist:  Dr. Sharlett Iles, remotely    Reason for Consultation:  Choledocholithiasis.    HPI: [name] is a 80 y.o. female.  In remarkably good functional health for her age.    1 to 2 hours bout of RUQ pain radiating into chest, some minor queasiness on Monday evening. Tuesday felt well.  Pain and queasiness recurred Tuesday evening and did not improve despite bismuth.  Came to ED.  Pain improved after Morphine and has not returned.    Labs revealed elevated LFTs and these have risen further today. T bili is 6.0 Imaging shows cholelithiasis, obstructing stone in distal CBD, 16 mm CBD. Intrahepatic ducts dilated as well.   No previous GB issues in pt or her family.  She says urine was "like a pumpkin" last night.  No jaundice or pruritus.  Remote hx of undetermined type of colon polyps but no records in Epic.   No fever or chills.  She has been started on Cipro and Vancomycin.     Past Medical History  Diagnosis Date  . Hypertension   . Hypothyroidism   . Glaucoma   . Acute bronchitis   . Palpitations   . Unspecified venous (peripheral) insufficiency   . Pure hypercholesterolemia   . GERD (gastroesophageal reflux disease)   . Diverticulosis of colon (without mention of hemorrhage)   . Benign neoplasm of colon   . Renal insufficiency   . DJD (degenerative joint disease)   . Vitamin D deficiency disease   . Anxiety state, unspecified     Past Surgical History  Procedure Laterality Date  . Cataract extraction    . Abdominal hysterectomy    . Breast biopsy      Benign  . Total knee arthroplasty  1999    right  . Total knee  arthroplasty  11/2008    left - Dr Lorin Mercy  . Toe amputation  08/2009    Right second toe - Dr Beola Cord    Prior to Admission medications   Medication Sig Start Date End Date Taking? Authorizing Provider  acetaminophen (TYLENOL) 325 MG tablet Per bottle as needed   Yes Historical Provider, MD  allopurinol (ZYLOPRIM) 100 MG tablet Take 100 mg by mouth daily.     Yes Historical Provider, MD  ALPRAZolam (XANAX) 0.25 MG tablet TAKE 1/2 TO 1 TABLET 3 TIMES A DAY AS NEEDED 07/12/13  Yes Noralee Space, MD  Ascorbic Acid (VITAMIN C) 500 MG tablet Take 500 mg by mouth daily.     Yes Historical Provider, MD  aspirin 81 MG tablet Take 81 mg by mouth daily.     Yes Historical Provider, MD  Cholecalciferol (VITAMIN D) 1000 UNITS capsule Take 1,000 Units by mouth daily.     Yes Historical Provider, MD  diltiazem (CARDIZEM CD) 180 MG 24 hr capsule TAKE 1 CAPSULE DAILY 08/16/13  Yes Noralee Space, MD  furosemide (LASIX)  40 MG tablet TAKE 2 TABLETS BY MOUTH ONCE EVERY MORNING 07/29/13  Yes Noralee Space, MD  levothyroxine (SYNTHROID, LEVOTHROID) 125 MCG tablet Take 1/2 tablet By mouth once daily 02/12/12  Yes Noralee Space, MD  omeprazole (PRILOSEC) 20 MG capsule Take 20 mg by mouth daily.     Yes Historical Provider, MD  Polyethyl Glycol-Propyl Glycol (SYSTANE) 0.4-0.3 % SOLN One drop in each eye two times a day    Yes Historical Provider, MD  simvastatin (ZOCOR) 20 MG tablet TAKE 1 TAB BY MOUTH AT BEDTIME 06/28/13  Yes Noralee Space, MD  timolol (TIMOPTIC) 0.5 % ophthalmic solution Place 1 drop into both eyes 2 (two) times daily.     Yes Historical Provider, MD  traMADol (ULTRAM) 50 MG tablet Take 1 tablet (50 mg total) by mouth 3 (three) times daily as needed. 08/16/13  Yes Noralee Space, MD  triamcinolone (KENALOG) 0.025 % cream Apply 1 application topically Twice daily. 09/11/12  Yes Historical Provider, MD    Scheduled Meds: . sodium chloride   Intravenous STAT  . [START ON 10/07/2013] ciprofloxacin  400 mg  Intravenous Q24H   Infusions: . sodium chloride     PRN Meds: morphine injection, ondansetron (ZOFRAN) IV, ondansetron (ZOFRAN) IV, ondansetron   Allergies as of 10/05/2013 - Review Complete 10/05/2013  Allergen Reaction Noted  . Enablex [darifenacin hydrobromide er]  02/12/2012  . Clarithromycin    . Codeine  07/08/2007  . Morphine    . Sulfonamide derivatives      Family History  Problem Relation Age of Onset  . Heart disease Mother   . Cancer Father     Throat  . Heart disease Father     History   Social History  . Marital Status: Widowed    Spouse Name: N/A    Number of Children: 1  . Years of Education: N/A   Occupational History  .     Social History Main Topics  . Smoking status: Never Smoker   . Smokeless tobacco: Never Used  . Alcohol Use: No  . Drug Use: No  . Sexual Activity: Not on file   Other Topics Concern  . Not on file   Social History Narrative  . Lives with her 8 yo daughter.     REVIEW OF SYSTEMS: Constitutional:  No weight loss.  Generally depressed appetite, this is chronic.  Able to do some cooking and cleaning.  ENT:  No nose bleeds Pulm:  No SOB or cough CV:  No palpitations, no LE edema.  GU:  No hematuria, no frequency GI:  No dysphagia.  BMs about 3 x weekly, no acholic stools Heme:  No trouble with anemia   Transfusions:  never Neuro:  No headaches, no peripheral tingling or numbness.  Occasional insomnia Derm:  Suffers from LE cellulitis/stasis dermatitis. Also under care of dermatilogist Dr [place] for psoriasis. No itching, no sores.  Endocrine:  No sweats or chills.  No polyuria or dysuria Immunization:  Up to date on flu shot, pneumovax.  Travel:  None beyond local counties in last few months.    PHYSICAL EXAM: Vital signs in last 24 hours: Filed Vitals:   10/06/13 0632  BP: 178/84  Pulse: 106  Temp: 98 F (36.7 C)  Resp:    Wt Readings from Last 3 Encounters:  10/06/13 62.9 kg (138 lb 10.7 oz)  08/16/13  67.495 kg (148 lb 12.8 oz)  04/21/13 63.776 kg (140 lb 9.6 oz)  General: pleasant, alert, does not look her age Head:  No asymmetry, no swelling  Eyes:  No icterus, + conj pallor Ears:  Not HOH.   Nose:  No congestion or discharge Mouth:  Clear, sp,e bruising on upper lip Neck:  No JVD, no mass, no goiter.  Lungs:  Clear bil.  No SOB Heart: RRR.  No MRG Abdomen:  Soft, NT, ND.  No bruits, no mass, no HSM.   Rectal: deferred   Musc/Skeltl: no joint erythema + arthritic changes in the hands, toes,  Extremities:  Feet warm, slight non-pitting, erythematous swelling of lower legs  Neurologic:  No tremor, no limb weakness, no memory deficits Skin:  Scattered psoriasis on limbs Tattoos:  none Nodes:  No cervical adenopathy   Psych:  Pleasant, relaxed.   Intake/Output from previous day:   Intake/Output this shift:    LAB RESULTS:  Recent Labs  10/06/13 0019 10/06/13 0609  WBC 9.7 10.8*  HGB 10.1* 9.9*  HCT 31.2* 30.0*  PLT 190 152   BMET Lab Results  Component Value Date   NA 138 10/06/2013   NA 133* 10/06/2013   NA 136 08/16/2013   K 4.2 10/06/2013   K 4.0 10/06/2013   K 4.0 08/16/2013   CL 98 10/06/2013   CL 92* 10/06/2013   CL 97 08/16/2013   CO2 27 10/06/2013   CO2 29 10/06/2013   CO2 29 08/16/2013   GLUCOSE 113* 10/06/2013   GLUCOSE 124* 10/06/2013   GLUCOSE 108* 08/16/2013   BUN 33* 10/06/2013   BUN 36* 10/06/2013   BUN 31* 08/16/2013   CREATININE 1.41* 10/06/2013   CREATININE 1.59* 10/06/2013   CREATININE 1.3* 08/16/2013   CALCIUM 8.7 10/06/2013   CALCIUM 9.1 10/06/2013   CALCIUM 9.3 08/16/2013   LFT  Recent Labs  10/06/13 0019 10/06/13 0609  PROT 7.1 6.0  ALBUMIN 3.6 2.9*  AST 167* 481*  ALT 86* 228*  ALKPHOS 129* 171*  BILITOT 2.2* 6.0*   PT/INR Lab Results  Component Value Date   INR 1.06 10/06/2013   INR 2.1* 12/09/2008   INR 2.6* 12/08/2008   Hepatitis Panel No results found for this basename: HEPBSAG, HCVAB, HEPAIGM, HEPBIGM,  in the last  72 hours C-Diff No components found with this basename: cdiff   Lipase     Component Value Date/Time   LIPASE 22 10/06/2013 0019      RADIOLOGY STUDIES: Dg Chest 2 View 10/06/2013  FINDINGS: Cardiac silhouette remains mildly enlarged, mediastinal silhouette is nonsuspicious, mildly calcified aorta. No pleural effusions or focal consolidations. Nodular density seen in left lung base on prior radiograph is less conspicuous today. No pneumothorax.  Patient is osteopenic.  Thoracolumbar dextroscoliosis.  IMPRESSION: Mild cardiomegaly, no acute pulmonary process.   Electronically Signed   By: Elon Alas   On: 10/06/2013 01:10   US Abdomen Complete 10/06/2013    FINDINGS: Gallbladder:  Moderately distended gallbladder containing shadowing gallstones, the largest approximating 2 cm. 1 cm gallstone in the neck of the gallbladder. Moderate echogenic sludge. No gallbladder wall thickening. Positive sonographic Murphy sign according to the ultrasound technologist.  Common bile duct:  Diameter: 16 mm. Approximate 0.8 cm gallstone in the distal common bile duct.  Liver:  Normal size and echotexture without focal parenchymal abnormality. Patent portal vein with hepatopetal flow. Mild intrahepatic biliary ductal dilation.  IVC:  Normal.  Pancreas:  Not visualized due to midline bowel gas.  Spleen:  Normal size and echotexture without focal parenchymal abnormality.  Right Kidney:  Length: Approximately 9.6 cm. Mild diffuse cortical thinning. No hydronephrosis. Multiple cysts as noted on the prior CT, the largest approximating 1.6 cm. No solid renal masses.  Left Kidney:  Length: Approximately 8.5 cm. Mild diffuse cortical thinning. No hydronephrosis. Multiple cysts as noted on the prior CT. No solid renal masses.  Abdominal aorta:  Not visualized due to midline bowel gas.  Other findings:  None.  IMPRESSION: 1. Distended gallbladder containing multiple gallstones, the largest approximating 2 cm, including a 1  cm gallstone in the gallbladder neck. Positive sonographic Murphy sign according to the ultrasound technologist would be consistent with acute cholecystitis. 2. Obstructing 0.8 cm gallstone in the distal common bile duct. Maximum common bile duct diameter 16 mm. Mild intrahepatic biliary ductal dilation. 3. Cortical thinning involving both kidneys and bilateral renal cysts. 4. Nonvisualization of the pancreas and the abdominal aorta due to the midline bowel gas.   Electronically Signed   By: Evangeline Dakin M.D.   On: 10/06/2013 01:37    ENDOSCOPIC STUDIES: None in Epic.   Remote colonoscopy and colon polyps Never had EGD  IMPRESSION:   *  Choledocholithiasis Possible cholangitis with fever just now of 101.6 *  Cholelithiasis   PLAN:     *  ERCP today.    Azucena Freed  10/06/2013, 8:28 AM Pager: [phone]     Attending physician's note   I have taken a history, examined the patient and reviewed the chart. I agree with the Advanced Practitioner's note, impression and recommendations. Cholelithiasis and choledocholithiasis. Possible cholangititis, cholecystitis. IV antibiotics and ERCP, later today.   Ladene Artist, MD [facility]

## 2013-10-06 NOTE — Anesthesia Procedure Notes (Signed)
Procedure Name: Intubation Date/Time: 10/06/2013 3:29 PM Performed by: Carney Living Pre-anesthesia Checklist: Patient identified, Emergency Drugs available, Suction available, Patient being monitored and Timeout performed Patient Re-evaluated:Patient Re-evaluated prior to inductionOxygen Delivery Method: Circle system utilized Preoxygenation: Pre-oxygenation with 100% oxygen Intubation Type: IV induction Ventilation: Mask ventilation without difficulty Laryngoscope Size: Mac and 4 Grade View: Grade II Tube type: Oral Tube size: 7.0 mm Number of attempts: 1 Airway Equipment and Method: Stylet Placement Confirmation: ETT inserted through vocal cords under direct vision,  positive ETCO2 and breath sounds checked- equal and bilateral Secured at: 20 cm Tube secured with: Tape Dental Injury: Teeth and Oropharynx as per pre-operative assessment

## 2013-10-07 ENCOUNTER — Inpatient Hospital Stay (HOSPITAL_COMMUNITY): Payer: Medicare Other

## 2013-10-07 ENCOUNTER — Encounter (HOSPITAL_COMMUNITY): Payer: Self-pay | Admitting: Gastroenterology

## 2013-10-07 DIAGNOSIS — R000 Tachycardia, unspecified: Secondary | ICD-10-CM

## 2013-10-07 DIAGNOSIS — R6510 Systemic inflammatory response syndrome (SIRS) of non-infectious origin without acute organ dysfunction: Secondary | ICD-10-CM

## 2013-10-07 LAB — COMPREHENSIVE METABOLIC PANEL
ALK PHOS: 209 U/L — AB (ref 39–117)
ALT: 318 U/L — AB (ref 0–35)
AST: 390 U/L — ABNORMAL HIGH (ref 0–37)
Albumin: 2.6 g/dL — ABNORMAL LOW (ref 3.5–5.2)
BUN: 31 mg/dL — ABNORMAL HIGH (ref 6–23)
CO2: 28 meq/L (ref 19–32)
Calcium: 8.4 mg/dL (ref 8.4–10.5)
Chloride: 100 mEq/L (ref 96–112)
Creatinine, Ser: 1.53 mg/dL — ABNORMAL HIGH (ref 0.50–1.10)
GFR calc non Af Amer: 28 mL/min — ABNORMAL LOW (ref >90)
GFR, EST AFRICAN AMERICAN: 32 mL/min — AB (ref >90)
GLUCOSE: 100 mg/dL — AB (ref 70–99)
POTASSIUM: 4.2 meq/L (ref 3.7–5.3)
SODIUM: 138 meq/L (ref 137–147)
TOTAL PROTEIN: 5.7 g/dL — AB (ref 6.0–8.3)
Total Bilirubin: 4.4 mg/dL — ABNORMAL HIGH (ref 0.3–1.2)

## 2013-10-07 LAB — CBC WITH DIFFERENTIAL/PLATELET
BASOS ABS: 0 10*3/uL (ref 0.0–0.1)
BASOS PCT: 0 % (ref 0–1)
EOS ABS: 0.1 10*3/uL (ref 0.0–0.7)
EOS PCT: 1 % (ref 0–5)
HCT: 28.2 % — ABNORMAL LOW (ref 36.0–46.0)
Hemoglobin: 9.2 g/dL — ABNORMAL LOW (ref 12.0–15.0)
Lymphocytes Relative: 14 % (ref 12–46)
Lymphs Abs: 1.4 10*3/uL (ref 0.7–4.0)
MCH: 31.8 pg (ref 26.0–34.0)
MCHC: 32.6 g/dL (ref 30.0–36.0)
MCV: 97.6 fL (ref 78.0–100.0)
Monocytes Absolute: 0.8 10*3/uL (ref 0.1–1.0)
Monocytes Relative: 8 % (ref 3–12)
Neutro Abs: 7.7 10*3/uL (ref 1.7–7.7)
Neutrophils Relative %: 77 % (ref 43–77)
Platelets: 213 10*3/uL (ref 150–400)
RBC: 2.89 MIL/uL — ABNORMAL LOW (ref 3.87–5.11)
RDW: 16.8 % — AB (ref 11.5–15.5)
WBC: 10.1 10*3/uL (ref 4.0–10.5)

## 2013-10-07 LAB — URINE MICROSCOPIC-ADD ON

## 2013-10-07 LAB — URINALYSIS, ROUTINE W REFLEX MICROSCOPIC
Glucose, UA: NEGATIVE mg/dL
Ketones, ur: NEGATIVE mg/dL
Nitrite: NEGATIVE
PH: 5.5 (ref 5.0–8.0)
PROTEIN: 30 mg/dL — AB
Specific Gravity, Urine: 1.013 (ref 1.005–1.030)
Urobilinogen, UA: 2 mg/dL — ABNORMAL HIGH (ref 0.0–1.0)

## 2013-10-07 LAB — PROCALCITONIN: Procalcitonin: 6.59 ng/mL

## 2013-10-07 LAB — LACTIC ACID, PLASMA: LACTIC ACID, VENOUS: 1.8 mmol/L (ref 0.5–2.2)

## 2013-10-07 MED ORDER — FAMOTIDINE 20 MG PO TABS
20.0000 mg | ORAL_TABLET | Freq: Two times a day (BID) | ORAL | Status: DC | PRN
  Administered 2013-10-07: 20 mg via ORAL
  Filled 2013-10-07 (×2): qty 1

## 2013-10-07 MED ORDER — POLYETHYL GLYCOL-PROPYL GLYCOL 0.4-0.3 % OP SOLN: 1.0000 [drp] | Freq: Two times a day (BID) | OPHTHALMIC | Status: DC

## 2013-10-07 MED ORDER — SODIUM CHLORIDE 0.9 % IV SOLN
INTRAVENOUS | Status: DC
  Administered 2013-10-07: 1000 mL via INTRAVENOUS
  Administered 2013-10-09: 13:00:00 via INTRAVENOUS

## 2013-10-07 MED ORDER — TIMOLOL MALEATE 0.5 % OP SOLN
1.0000 [drp] | Freq: Two times a day (BID) | OPHTHALMIC | Status: DC
  Administered 2013-10-07 – 2013-10-11 (×9): 1 [drp] via OPHTHALMIC
  Filled 2013-10-07: qty 5

## 2013-10-07 MED ORDER — POLYVINYL ALCOHOL 1.4 % OP SOLN
1.0000 [drp] | Freq: Two times a day (BID) | OPHTHALMIC | Status: DC
  Administered 2013-10-07 – 2013-10-11 (×8): 1 [drp] via OPHTHALMIC
  Filled 2013-10-07: qty 15

## 2013-10-07 MED ORDER — TECHNETIUM TC 99M MEBROFENIN IV KIT
5.0000 | PACK | Freq: Once | INTRAVENOUS | Status: AC | PRN
  Administered 2013-10-07: 5 via INTRAVENOUS

## 2013-10-07 NOTE — Progress Notes (Signed)
Daily Rounding Note  10/07/2013, 9:26 AM  LOS: 2 days   SUBJECTIVE:       Not much pain in RUQ, no nausea.  C/o dry mouth.  Feels unwell and fatigued. Feels chills, blood cultures being drawn right now.  OBJECTIVE:         Vital signs in last 24 hours:    Temp:  [97.8 F (36.6 C)-101.6 F (38.7 C)] 97.9 F (36.6 C) (01/15 0903) Pulse Rate:  [93-119] 119 (01/15 0903) Resp:  [16-21] 20 (01/15 0903) BP: (100-149)/(35-79) 149/75 mmHg (01/15 0903) SpO2:  [82 %-100 %] 97 % (01/15 0903) Last BM Date: 10/05/13 General: looks ill, and tired. worse than yesterday.   Heart: Tachy Chest: a few rales in left base, slightly dyspneic without cough Abdomen: soft, minor tenderness of RUQ.  BS hypoactive  Extremities: no CCE.  Tear on right forearm is covered in clear dressing.  Generally paper thin skin Neuro/Psych:  Pleasant, alert, not confused.    Intake/Output from previous day: 01/14 0701 - 01/15 0700 In: 600 [I.V.:600] Out: 1200 [Urine:1200]  Intake/Output this shift: Total I/O In: 420 [P.O.:420] Out: -   Lab Results:  Recent Labs  10/06/13 0019 10/06/13 0609 10/07/13 0749  WBC 9.7 10.8* 10.1  HGB 10.1* 9.9* 9.2*  HCT 31.2* 30.0* 28.2*  PLT 190 152 213  MCV    97  BMET  Recent Labs  10/06/13 0019 10/06/13 0609 10/07/13 0749  NA 133* 138 138  K 4.0 4.2 4.2  CL 92* 98 100  CO2 29 27 28   GLUCOSE 124* 113* 100*  BUN 36* 33* 31*  CREATININE 1.59* 1.41* 1.53*  CALCIUM 9.1 8.7 8.4   LFT  Recent Labs  10/06/13 0019 10/06/13 0609 10/07/13 0749  PROT 7.1 6.0 5.7*  ALBUMIN 3.6 2.9* 2.6*  AST 167* 481* 390*  ALT 86* 228* 318*  ALKPHOS 129* 171* 209*  BILITOT 2.2* 6.0* 4.4*   PT/INR  Recent Labs  10/06/13 0609  LABPROT 13.6  INR 1.06    Studies/Results: Dg Chest 2 View  10/06/2013   CLINICAL DATA:  Chest pain and nausea.  EXAM: CHEST  2 VIEW  COMPARISON:  Chest radiograph December 19, 2010   FINDINGS: Cardiac silhouette remains mildly enlarged, mediastinal silhouette is nonsuspicious, mildly calcified aorta. No pleural effusions or focal consolidations. Nodular density seen in left lung base on prior radiograph is less conspicuous today. No pneumothorax.  Patient is osteopenic.  Thoracolumbar dextroscoliosis.  IMPRESSION: Mild cardiomegaly, no acute pulmonary process.   Electronically Signed   By: Elon Alas   On: 10/06/2013 01:10   US Abdomen Complete  10/06/2013   CLINICAL DATA:  80 year old with right upper quadrant abdominal pain.  EXAM: ULTRASOUND ABDOMEN COMPLETE  COMPARISON:  CT abdomen and pelvis 10/09/2010.  FINDINGS: Gallbladder:  Moderately distended gallbladder containing shadowing gallstones, the largest approximating 2 cm. 1 cm gallstone in the neck of the gallbladder. Moderate echogenic sludge. No gallbladder wall thickening. Positive sonographic Murphy sign according to the ultrasound technologist.  Common bile duct:  Diameter: 16 mm. Approximate 0.8 cm gallstone in the distal common bile duct.  Liver:  Normal size and echotexture without focal parenchymal abnormality. Patent portal vein with hepatopetal flow. Mild intrahepatic biliary ductal dilation.  IVC:  Normal.  Pancreas:  Not visualized due to midline bowel gas.  Spleen:  Normal size and echotexture without focal parenchymal abnormality.  Right Kidney:  Length: Approximately 9.6 cm. Mild  diffuse cortical thinning. No hydronephrosis. Multiple cysts as noted on the prior CT, the largest approximating 1.6 cm. No solid renal masses.  Left Kidney:  Length: Approximately 8.5 cm. Mild diffuse cortical thinning. No hydronephrosis. Multiple cysts as noted on the prior CT. No solid renal masses.  Abdominal aorta:  Not visualized due to midline bowel gas.  Other findings:  None.  IMPRESSION: 1. Distended gallbladder containing multiple gallstones, the largest approximating 2 cm, including a 1 cm gallstone in the gallbladder neck.  Positive sonographic Murphy sign according to the ultrasound technologist would be consistent with acute cholecystitis. 2. Obstructing 0.8 cm gallstone in the distal common bile duct. Maximum common bile duct diameter 16 mm. Mild intrahepatic biliary ductal dilation. 3. Cortical thinning involving both kidneys and bilateral renal cysts. 4. Nonvisualization of the pancreas and the abdominal aorta due to the midline bowel gas.   Electronically Signed   By: Evangeline Dakin M.D.   On: 10/06/2013 01:37   Dg Ercp Biliary & Pancreatic Ducts  10/06/2013   CLINICAL DATA:  Common bile duct stones.  EXAM: ERCP  TECHNIQUE: Multiple spot images obtained with the fluoroscopic device and submitted for interpretation post-procedure.  COMPARISON:  Abdominal ultrasound 10/06/2013.  FINDINGS: Fluoroscopic spot images from an ERCP demonstrate cannulation of the common bile duct with subsequent injection of contrast material demonstrating dilatation of the common bile duct and multiple gallstones. Common bile duct stones are also suspected. Subsequent sphincterotomy and balloon passage.  IMPRESSION: Cholelithiasis and choledocholithiasis with subsequent sphincterotomy and balloon passage.  These images were submitted for radiologic interpretation only. Please see the procedural report for the amount of contrast and the fluoroscopy time utilized.   Electronically Signed   By: Kalman Jewels M.D.   On: 10/06/2013 16:16    ASSESMENT:   * Choledocholithiasis. Possible cholangitis vs cholecystitis with fever.  Blood cultures drawn.  10/06/13 ERCP with sphincterotomy and extraction of 2 stones from CBD.  Dilated CBD and intrahepatic ducts noted.  LFTs overall improved. No fevers since 2000 last night. WBCs improved.  *  Cholelithiasis *  CKD.  *  Normocytic anemia. Acute Hgb 12.6 late 07/2013, 7 weeks ago.     PLAN   *  General surgery plan HIDA scan today.  If +: ? Perc biliary drain vs cholecystectomy   Azucena Freed   10/07/2013, 9:26 AM Pager: [phone]    Attending physician's note   I have taken an interval history, reviewed the chart and examined the patient. I agree with the Advanced Practitioner's note, impression and recommendations. Stable post ERCP with sphincterotomy and CBD stone extraction. LFTs improving. Await HIDA. Continue IV antibiotics.  Pricilla Riffle. Fuller Plan, MD [facility]

## 2013-10-07 NOTE — Progress Notes (Signed)
Abdomen NT and soft. Will check HIDA. I also spoke to the GI NP and the patient's daughter. Patient examined and I agree with the assessment and plan  Georganna Skeans, MD, MPH, FACS Pager: [phone]  10/07/2013 11:13 AM

## 2013-10-07 NOTE — Progress Notes (Signed)
[facility] PROGRESS NOTE  MICHAL STRZELECKI RUE:[id number] DOB: 10-07-1914 DOA: 10/05/2013 PCP: Noralee Space, MD  Assessment/Plan: #1 systemic inflammatory response Patient noted to be tachycardic with a fever. Abdominal ultrasound which was done yesterday did show distended gallbladder containing multiple gallstones, positive sonographic Murphy sign consistent with acute cholecystitis, obstructive 0.8 cm gallstone in the distal, mild duct. Patient also noted to have bilateral lower extremity cellulitis. Patient is status post ERCP with sphincterectomy. Will check a chest x-ray. Check blood cultures x2. Check a UA with cultures and sensitivities. Check a lactic acid level. Check a pro calcitonin level. Continue empiric IV vancomycin and IV ciprofloxacin.  #2 gallstones with probable acute cholecystitis and choledocholithiasis status post ERCP with sphincterectomy and stone extraction 10/06/2013 Patient is status post ERCP with sphincterectomy and stone extraction yesterday. Abdominal ultrasound which was done on admission was consistent with acute cholecystitis and multiple gallstones and nonobstructing the stone in the common bile duct. Patient noted to have fevers and is tachycardic. LFTs slowly trending down. Continue empiric IV ciprofloxacin. Patient has been seen by general surgeon either scan has been ordered. General surgery and GI following and appreciate input and recommendations.  #3 bilateral lower extremity cellulitis Continue empiric IV vancomycin. The lower extremities elevated.  #4 chronic kidney disease stage III Stable. Follow.  #5 hypertension Continue Cardizem.  #6 hypothyroidism Continue Synthroid.  #7 gastroesophageal reflux disease Placed on a PPI.  #8 prophylaxis PPI for GI prophylaxis. SCDs for DVT prophylaxis.  Code Status: Full Family Communication: Updated patient and daughter at bedside. Disposition Plan: Home when medically  stable   Consultants:  Gastroenterology: Dr. Fuller Plan 10/06/2013  General surgery: Dr. Georganna Skeans 10/06/2013  Cardiology: Dr. Gwenlyn Found 10/06/2013  Procedures:  Abdominal ultrasound 10/06/2013  Chest x-ray 10/06/2013  ERCP with sphincterectomy/papillotomy and ERCP with removal of calculi 10/06/2013 per Dr. Fuller Plan  Antibiotics:  IV vancomycin 10/06/2013  IV ciprofloxacin 10/06/2013  HPI/Subjective: Patient denies any abdominal pain. Patient states feeling cold. No nausea. No vomiting.  Objective: Filed Vitals:   10/07/13 0927  BP:   Pulse:   Temp: 99.6 F (37.6 C)  Resp:     Intake/Output Summary (Last 24 hours) at 10/07/13 0938 Last data filed at 10/07/13 0847  Gross per 24 hour  Intake   1020 ml  Output   1200 ml  Net   -180 ml   Filed Weights   10/06/13 0343  Weight: 62.9 kg (138 lb 10.7 oz)    Exam:   General:  NAD  Cardiovascular: Tachycardia  Respiratory: CTAB anterior lung fields  Abdomen: Soft/NT/ND/+BS  Musculoskeletal: BLE with 1-2 + edema and erythema and warmth  Data Reviewed: Basic Metabolic Panel:  Recent Labs Lab 10/06/13 0019 10/06/13 0609 10/07/13 0749  NA 133* 138 138  K 4.0 4.2 4.2  CL 92* 98 100  CO2 29 27 28   GLUCOSE 124* 113* 100*  BUN 36* 33* 31*  CREATININE 1.59* 1.41* 1.53*  CALCIUM 9.1 8.7 8.4   Liver Function Tests:  Recent Labs Lab 10/06/13 0019 10/06/13 0609 10/07/13 0749  AST 167* 481* 390*  ALT 86* 228* 318*  ALKPHOS 129* 171* 209*  BILITOT 2.2* 6.0* 4.4*  PROT 7.1 6.0 5.7*  ALBUMIN 3.6 2.9* 2.6*    Recent Labs Lab 10/06/13 0019  LIPASE 22   No results found for this basename: AMMONIA,  in the last 168 hours CBC:  Recent Labs Lab 10/06/13 0019 10/06/13 0609 10/07/13 0749  WBC 9.7 10.8* 10.1  NEUTROABS 7.1  --  7.7  HGB 10.1* 9.9* 9.2*  HCT 31.2* 30.0* 28.2*  MCV 95.4 93.5 97.6  PLT 190 152 213   Cardiac Enzymes:  Recent Labs Lab 10/06/13 0019  TROPONINI <0.30   BNP (last  3 results)  Recent Labs  10/06/13 0019  PROBNP 329.3   CBG: No results found for this basename: GLUCAP,  in the last 168 hours  No results found for this or any previous visit (from the past 240 hour(s)).   Studies: Dg Chest 2 View  10/06/2013   CLINICAL DATA:  Chest pain and nausea.  EXAM: CHEST  2 VIEW  COMPARISON:  Chest radiograph December 19, 2010  FINDINGS: Cardiac silhouette remains mildly enlarged, mediastinal silhouette is nonsuspicious, mildly calcified aorta. No pleural effusions or focal consolidations. Nodular density seen in left lung base on prior radiograph is less conspicuous today. No pneumothorax.  Patient is osteopenic.  Thoracolumbar dextroscoliosis.  IMPRESSION: Mild cardiomegaly, no acute pulmonary process.   Electronically Signed   By: Elon Alas   On: 10/06/2013 01:10   US Abdomen Complete  10/06/2013   CLINICAL DATA:  80 year old with right upper quadrant abdominal pain.  EXAM: ULTRASOUND ABDOMEN COMPLETE  COMPARISON:  CT abdomen and pelvis 10/09/2010.  FINDINGS: Gallbladder:  Moderately distended gallbladder containing shadowing gallstones, the largest approximating 2 cm. 1 cm gallstone in the neck of the gallbladder. Moderate echogenic sludge. No gallbladder wall thickening. Positive sonographic Murphy sign according to the ultrasound technologist.  Common bile duct:  Diameter: 16 mm. Approximate 0.8 cm gallstone in the distal common bile duct.  Liver:  Normal size and echotexture without focal parenchymal abnormality. Patent portal vein with hepatopetal flow. Mild intrahepatic biliary ductal dilation.  IVC:  Normal.  Pancreas:  Not visualized due to midline bowel gas.  Spleen:  Normal size and echotexture without focal parenchymal abnormality.  Right Kidney:  Length: Approximately 9.6 cm. Mild diffuse cortical thinning. No hydronephrosis. Multiple cysts as noted on the prior CT, the largest approximating 1.6 cm. No solid renal masses.  Left Kidney:  Length:  Approximately 8.5 cm. Mild diffuse cortical thinning. No hydronephrosis. Multiple cysts as noted on the prior CT. No solid renal masses.  Abdominal aorta:  Not visualized due to midline bowel gas.  Other findings:  None.  IMPRESSION: 1. Distended gallbladder containing multiple gallstones, the largest approximating 2 cm, including a 1 cm gallstone in the gallbladder neck. Positive sonographic Murphy sign according to the ultrasound technologist would be consistent with acute cholecystitis. 2. Obstructing 0.8 cm gallstone in the distal common bile duct. Maximum common bile duct diameter 16 mm. Mild intrahepatic biliary ductal dilation. 3. Cortical thinning involving both kidneys and bilateral renal cysts. 4. Nonvisualization of the pancreas and the abdominal aorta due to the midline bowel gas.   Electronically Signed   By: Evangeline Dakin M.D.   On: 10/06/2013 01:37   Dg Ercp Biliary & Pancreatic Ducts  10/06/2013   CLINICAL DATA:  Common bile duct stones.  EXAM: ERCP  TECHNIQUE: Multiple spot images obtained with the fluoroscopic device and submitted for interpretation post-procedure.  COMPARISON:  Abdominal ultrasound 10/06/2013.  FINDINGS: Fluoroscopic spot images from an ERCP demonstrate cannulation of the common bile duct with subsequent injection of contrast material demonstrating dilatation of the common bile duct and multiple gallstones. Common bile duct stones are also suspected. Subsequent sphincterotomy and balloon passage.  IMPRESSION: Cholelithiasis and choledocholithiasis with subsequent sphincterotomy and balloon passage.  These images were submitted for radiologic interpretation only. Please see the  procedural report for the amount of contrast and the fluoroscopy time utilized.   Electronically Signed   By: Kalman Jewels M.D.   On: 10/06/2013 16:16    Scheduled Meds: . allopurinol  100 mg Oral Daily  . ciprofloxacin  400 mg Intravenous Q24H  . diltiazem  180 mg Oral Daily  . levothyroxine   62.5 mcg Oral QAC breakfast  . polyvinyl alcohol  1 drop Both Eyes BID  . timolol  1 drop Both Eyes BID  . vancomycin  500 mg Intravenous Q24H   Continuous Infusions: . sodium chloride 50 mL/hr at 10/07/13 8502    Principal Problem:   SIRS (systemic inflammatory response syndrome) Active Problems:   Choledocholithiasis with obstruction   Cellulitis of leg: Bilateral R> L   HYPOTHYROIDISM   ANXIETY   HYPERTENSION   GERD   RENAL INSUFFICIENCY   Aortic valve disorder   Swelling of limb   Choledocholithiasis   Abdominal pain, acute, right upper quadrant   Cholecystitis, acute   Anemia   CKD (chronic kidney disease), stage III    Time spent: Potter MD [facility] Pager [phone]. If 7PM-7AM, please contact night-coverage at [email or web address], password [facility] 10/07/2013, 9:38 AM  LOS: 2 days

## 2013-10-07 NOTE — Progress Notes (Signed)
Patient ID: Barbaraann Share, female   DOB: 1914-10-21, 80 y.o.   MRN: [id number] 1 Day Post-Op  Subjective: Pt c/o being cold today.  Denies abdominal pain.  Objective: Vital signs in last 24 hours: Temp:  [97.8 F (36.6 C)-101.6 F (38.7 C)] 99.6 F (37.6 C) (01/15 0927) Pulse Rate:  [93-119] 119 (01/15 0903) Resp:  [16-21] 20 (01/15 0903) BP: (100-149)/(35-79) 149/75 mmHg (01/15 0903) SpO2:  [82 %-100 %] 97 % (01/15 0903) Last BM Date: 10/05/13  Intake/Output from previous day: 01/14 0701 - 01/15 0700 In: 600 [I.V.:600] Out: 1200 [Urine:1200] Intake/Output this shift: Total I/O In: 420 [P.O.:420] Out: -   PE: Abd: soft, seems NT to palpation today, ND, +BS Heart: tachy  Lab Results:   Recent Labs  10/06/13 0609 10/07/13 0749  WBC 10.8* 10.1  HGB 9.9* 9.2*  HCT 30.0* 28.2*  PLT 152 213   BMET  Recent Labs  10/06/13 0609 10/07/13 0749  NA 138 138  K 4.2 4.2  CL 98 100  CO2 27 28  GLUCOSE 113* 100*  BUN 33* 31*  CREATININE 1.41* 1.53*  CALCIUM 8.7 8.4   PT/INR  Recent Labs  10/06/13 0609  LABPROT 13.6  INR 1.06   CMP     Component Value Date/Time   NA 138 10/07/2013 0749   K 4.2 10/07/2013 0749   CL 100 10/07/2013 0749   CO2 28 10/07/2013 0749   GLUCOSE 100* 10/07/2013 0749   BUN 31* 10/07/2013 0749   CREATININE 1.53* 10/07/2013 0749   CALCIUM 8.4 10/07/2013 0749   PROT 5.7* 10/07/2013 0749   ALBUMIN 2.6* 10/07/2013 0749   AST 390* 10/07/2013 0749   ALT 318* 10/07/2013 0749   ALKPHOS 209* 10/07/2013 0749   BILITOT 4.4* 10/07/2013 0749   GFRNONAA 28* 10/07/2013 0749   GFRAA 32* 10/07/2013 0749   Lipase     Component Value Date/Time   LIPASE 22 10/06/2013 0019       Studies/Results: Dg Chest 2 View  10/06/2013   CLINICAL DATA:  Chest pain and nausea.  EXAM: CHEST  2 VIEW  COMPARISON:  Chest radiograph December 19, 2010  FINDINGS: Cardiac silhouette remains mildly enlarged, mediastinal silhouette is nonsuspicious, mildly calcified aorta. No  pleural effusions or focal consolidations. Nodular density seen in left lung base on prior radiograph is less conspicuous today. No pneumothorax.  Patient is osteopenic.  Thoracolumbar dextroscoliosis.  IMPRESSION: Mild cardiomegaly, no acute pulmonary process.   Electronically Signed   By: Elon Alas   On: 10/06/2013 01:10   US Abdomen Complete  10/06/2013   CLINICAL DATA:  80 year old with right upper quadrant abdominal pain.  EXAM: ULTRASOUND ABDOMEN COMPLETE  COMPARISON:  CT abdomen and pelvis 10/09/2010.  FINDINGS: Gallbladder:  Moderately distended gallbladder containing shadowing gallstones, the largest approximating 2 cm. 1 cm gallstone in the neck of the gallbladder. Moderate echogenic sludge. No gallbladder wall thickening. Positive sonographic Murphy sign according to the ultrasound technologist.  Common bile duct:  Diameter: 16 mm. Approximate 0.8 cm gallstone in the distal common bile duct.  Liver:  Normal size and echotexture without focal parenchymal abnormality. Patent portal vein with hepatopetal flow. Mild intrahepatic biliary ductal dilation.  IVC:  Normal.  Pancreas:  Not visualized due to midline bowel gas.  Spleen:  Normal size and echotexture without focal parenchymal abnormality.  Right Kidney:  Length: Approximately 9.6 cm. Mild diffuse cortical thinning. No hydronephrosis. Multiple cysts as noted on the prior CT, the largest approximating 1.6 cm.  No solid renal masses.  Left Kidney:  Length: Approximately 8.5 cm. Mild diffuse cortical thinning. No hydronephrosis. Multiple cysts as noted on the prior CT. No solid renal masses.  Abdominal aorta:  Not visualized due to midline bowel gas.  Other findings:  None.  IMPRESSION: 1. Distended gallbladder containing multiple gallstones, the largest approximating 2 cm, including a 1 cm gallstone in the gallbladder neck. Positive sonographic Murphy sign according to the ultrasound technologist would be consistent with acute cholecystitis. 2.  Obstructing 0.8 cm gallstone in the distal common bile duct. Maximum common bile duct diameter 16 mm. Mild intrahepatic biliary ductal dilation. 3. Cortical thinning involving both kidneys and bilateral renal cysts. 4. Nonvisualization of the pancreas and the abdominal aorta due to the midline bowel gas.   Electronically Signed   By: Evangeline Dakin M.D.   On: 10/06/2013 01:37   Dg Ercp Biliary & Pancreatic Ducts  10/06/2013   CLINICAL DATA:  Common bile duct stones.  EXAM: ERCP  TECHNIQUE: Multiple spot images obtained with the fluoroscopic device and submitted for interpretation post-procedure.  COMPARISON:  Abdominal ultrasound 10/06/2013.  FINDINGS: Fluoroscopic spot images from an ERCP demonstrate cannulation of the common bile duct with subsequent injection of contrast material demonstrating dilatation of the common bile duct and multiple gallstones. Common bile duct stones are also suspected. Subsequent sphincterotomy and balloon passage.  IMPRESSION: Cholelithiasis and choledocholithiasis with subsequent sphincterotomy and balloon passage.  These images were submitted for radiologic interpretation only. Please see the procedural report for the amount of contrast and the fluoroscopy time utilized.   Electronically Signed   By: Kalman Jewels M.D.   On: 10/06/2013 16:16    Anti-infectives: Anti-infectives   Start     Dose/Rate Route Frequency Ordered Stop   10/07/13 1200  vancomycin (VANCOCIN) 500 mg in sodium chloride 0.9 % 100 mL IVPB     500 mg 100 mL/hr over 60 Minutes Intravenous Every 24 hours 10/06/13 1002     10/07/13 0600  ciprofloxacin (CIPRO) IVPB 400 mg     400 mg 200 mL/hr over 60 Minutes Intravenous Every 24 hours 10/06/13 0337     10/06/13 1015  vancomycin (VANCOCIN) IVPB 1000 mg/200 mL premix     1,000 mg 200 mL/hr over 60 Minutes Intravenous  Once 10/06/13 1002 10/06/13 1222   10/06/13 0230  ciprofloxacin (CIPRO) IVPB 400 mg  Status:  Discontinued     400 mg 200 mL/hr  over 60 Minutes Intravenous Every 12 hours 10/06/13 0216 10/06/13 0337       Assessment/Plan  1. Choledocholithiasis, s/p ERCP with sphincterotomy 2. ? Cholecystitis 3. Tachycardia 4. Fever  Plan: 1. Patient now s/p ERCP.  Fever of 101.6 yesterday and tachycardic.  Patient is pain-free today, but concerned about cholecystitis given other factors.  I have d/w Dr. Brantley Stage.  We will proceed with a HIDA scan to determine if they patient has cholecystitis.  If this were the case, we would then need to d/w the patient and her daughter on whether to proceed with a percutaneous drain or surgery.   2. NPO and hold narcotics so we can obtain a HIDA scan. 3. D/w Dr. Grandville Silos as well.  LOS: 2 days    Olevia Westervelt E 10/07/2013, 9:33 AM Pager: [phone]

## 2013-10-07 NOTE — Anesthesia Postprocedure Evaluation (Signed)
  Anesthesia Post-op Note  Patient: [name]  Procedure(s) Performed: Procedure(s): ENDOSCOPIC RETROGRADE CHOLANGIOPANCREATOGRAPHY (ERCP) (N/A)  Patient Location: PACU  Anesthesia Type:General  Level of Consciousness: awake, alert  and oriented  Airway and Oxygen Therapy: Patient Spontanous Breathing and Patient connected to nasal cannula oxygen  Post-op Pain: mild  Post-op Assessment: Post-op Vital signs reviewed, Patient's Cardiovascular Status Stable, Respiratory Function Stable, Patent Airway, No signs of Nausea or vomiting and Pain level controlled  Post-op Vital Signs: Reviewed and stable  Complications: No apparent anesthesia complications

## 2013-10-08 ENCOUNTER — Inpatient Hospital Stay (HOSPITAL_COMMUNITY): Payer: Medicare Other

## 2013-10-08 LAB — COMPREHENSIVE METABOLIC PANEL
ALT: 191 U/L — ABNORMAL HIGH (ref 0–35)
AST: 138 U/L — ABNORMAL HIGH (ref 0–37)
Albumin: 2.2 g/dL — ABNORMAL LOW (ref 3.5–5.2)
Alkaline Phosphatase: 175 U/L — ABNORMAL HIGH (ref 39–117)
BUN: 33 mg/dL — ABNORMAL HIGH (ref 6–23)
CO2: 24 mEq/L (ref 19–32)
Calcium: 8.1 mg/dL — ABNORMAL LOW (ref 8.4–10.5)
Chloride: 103 mEq/L (ref 96–112)
Creatinine, Ser: 1.4 mg/dL — ABNORMAL HIGH (ref 0.50–1.10)
GFR calc non Af Amer: 31 mL/min — ABNORMAL LOW (ref >90)
GFR, EST AFRICAN AMERICAN: 36 mL/min — AB (ref >90)
Glucose, Bld: 81 mg/dL (ref 70–99)
Potassium: 3.9 mEq/L (ref 3.7–5.3)
SODIUM: 138 meq/L (ref 137–147)
TOTAL PROTEIN: 4.9 g/dL — AB (ref 6.0–8.3)
Total Bilirubin: 2.6 mg/dL — ABNORMAL HIGH (ref 0.3–1.2)

## 2013-10-08 LAB — CBC WITH DIFFERENTIAL/PLATELET
BASOS ABS: 0 10*3/uL (ref 0.0–0.1)
BASOS PCT: 0 % (ref 0–1)
EOS PCT: 1 % (ref 0–5)
Eosinophils Absolute: 0.2 10*3/uL (ref 0.0–0.7)
HEMATOCRIT: 24.6 % — AB (ref 36.0–46.0)
Hemoglobin: 8.1 g/dL — ABNORMAL LOW (ref 12.0–15.0)
Lymphocytes Relative: 10 % — ABNORMAL LOW (ref 12–46)
Lymphs Abs: 1.5 10*3/uL (ref 0.7–4.0)
MCH: 31.5 pg (ref 26.0–34.0)
MCHC: 32.9 g/dL (ref 30.0–36.0)
MCV: 95.7 fL (ref 78.0–100.0)
Monocytes Absolute: 1.2 10*3/uL — ABNORMAL HIGH (ref 0.1–1.0)
Monocytes Relative: 8 % (ref 3–12)
NEUTROS ABS: 12.4 10*3/uL — AB (ref 1.7–7.7)
Neutrophils Relative %: 81 % — ABNORMAL HIGH (ref 43–77)
Platelets: 172 10*3/uL (ref 150–400)
RBC: 2.57 MIL/uL — ABNORMAL LOW (ref 3.87–5.11)
RDW: 17.2 % — AB (ref 11.5–15.5)
WBC: 15.2 10*3/uL — ABNORMAL HIGH (ref 4.0–10.5)

## 2013-10-08 MED ORDER — MIDAZOLAM HCL 2 MG/2ML IJ SOLN
INTRAMUSCULAR | Status: AC | PRN
  Administered 2013-10-08: 1 mg via INTRAVENOUS
  Administered 2013-10-08 (×7): 0.5 mg via INTRAVENOUS
  Administered 2013-10-08: 1 mg via INTRAVENOUS

## 2013-10-08 MED ORDER — MIDAZOLAM HCL 2 MG/2ML IJ SOLN
INTRAMUSCULAR | Status: AC
  Filled 2013-10-08: qty 2

## 2013-10-08 MED ORDER — FENTANYL CITRATE 0.05 MG/ML IJ SOLN
INTRAMUSCULAR | Status: AC
  Filled 2013-10-08: qty 2

## 2013-10-08 MED ORDER — IOHEXOL 300 MG/ML  SOLN: 50.0000 mL | Freq: Once | INTRAMUSCULAR | Status: AC | PRN

## 2013-10-08 MED ORDER — FENTANYL CITRATE 0.05 MG/ML IJ SOLN
INTRAMUSCULAR | Status: AC | PRN
  Administered 2013-10-08: 12.5 ug via INTRAVENOUS
  Administered 2013-10-08 (×3): 25 ug via INTRAVENOUS
  Administered 2013-10-08: 12.5 ug via INTRAVENOUS
  Administered 2013-10-08 (×3): 25 ug via INTRAVENOUS
  Administered 2013-10-08: 12.5 ug via INTRAVENOUS

## 2013-10-08 NOTE — Progress Notes (Signed)
Daily Rounding Note  10/08/2013, 8:41 AM  LOS: 3 days   SUBJECTIVE:  Did not sleep well last night.  No abdominal pain.  Urine still orange but less so. No nausea    For perc biliary drain.    OBJECTIVE:         Vital signs in last 24 hours:    Temp:  [97.6 F (36.4 C)-99.6 F (37.6 C)] 98.2 F (36.8 C) (01/16 0520) Pulse Rate:  [82-128] 83 (01/16 0520) Resp:  [16-20] 18 (01/16 0520) BP: (105-154)/(43-75) 129/43 mmHg (01/16 0520) SpO2:  [92 %-100 %] 100 % (01/16 0520) Weight:  [67.223 kg (148 lb 3.2 oz)] 67.223 kg (148 lb 3.2 oz) (01/16 0500) Last BM Date: 10/07/13 General: still jaundiced, looks better than yesterday.   Heart: RRR.  No MRG Chest: clear, no resp distress Abdomen: soft, NT, hypoactive BS  Extremities: less erythema and swelling from knees down bil. Neuro/Psych:  Pleasant, oriented x 3.  Relaxed.   Intake/Output from previous day: 01/15 0701 - 01/16 0700 In: 420 [P.O.:420] Out: -   Intake/Output this shift:    Lab Results:  Recent Labs  10/06/13 0609 10/07/13 0749 10/08/13 0430  WBC 10.8* 10.1 15.2*  HGB 9.9* 9.2* 8.1*  HCT 30.0* 28.2* 24.6*  PLT 152 213 172   BMET  Recent Labs  10/06/13 0609 10/07/13 0749 10/08/13 0430  NA 138 138 138  K 4.2 4.2 3.9  CL 98 100 103  CO2 27 28 24   GLUCOSE 113* 100* 81  BUN 33* 31* 33*  CREATININE 1.41* 1.53* 1.40*  CALCIUM 8.7 8.4 8.1*   LFT  Recent Labs  10/06/13 0609 10/07/13 0749 10/08/13 0430  PROT 6.0 5.7* 4.9*  ALBUMIN 2.9* 2.6* 2.2*  AST 481* 390* 138*  ALT 228* 318* 191*  ALKPHOS 171* 209* 175*  BILITOT 6.0* 4.4* 2.6*   PT/INR  Recent Labs  10/06/13 0609  LABPROT 13.6  INR 1.06    Studies/Results: Nm Hepatobiliary 10/07/2013   FINDINGS: Homogeneous uptake of radiotracer within the liver. Bile is excreted into the the common bile duct and flows into the small bowel by 15 min. Examination was continued to 120 min.  There was no filling of the gallbladder over 120 min time. Patient has a morphine allergy.  IMPRESSION: 1. No filling of the gallbladder suggests obstruction of the cystic duct / acute cholecystitis.  2.  Patent common bile duct.  Findings conveyed toDr.  Marcello Moores On 10/07/2013  [id number].   Electronically Signed   By: Suzy Bouchard M.D.   On: 10/07/2013 18:02   Dg Chest Port 1 View 10/07/2013  FINDINGS: Stable mild cardiomegaly. Inspiratory volumes are lower. This results in crowding of the pulmonary vasculature and slight Lee increased prominence of the interstitial markings. Stable atherosclerotic and tortuous thoracic aorta. No new focal airspace consolidation. No pleural effusion or pneumothorax.  IMPRESSION: Lower inspiratory volumes results in a crowding of the pulmonary vasculature.  No new focal airspace consolidation to suggest pneumonia.   Electronically Signed   By: Jacqulynn Cadet M.D.   On: 10/07/2013 09:58   Dg Ercp Biliary & Pancreatic Ducts 10/06/2013  FINDINGS: Fluoroscopic spot images from an ERCP demonstrate cannulation of the common bile duct with subsequent injection of contrast material demonstrating dilatation of the common bile duct and multiple gallstones. Common bile duct stones are also suspected. Subsequent sphincterotomy and balloon passage.  IMPRESSION: Cholelithiasis and choledocholithiasis with subsequent sphincterotomy and balloon  passage.  These images were submitted for radiologic interpretation only. Please see the procedural report for the amount of contrast and the fluoroscopy time utilized.   Electronically Signed   By: Kalman Jewels M.D.   On: 10/06/2013 16:16    ASSESMENT:   * Choledocholithiasis. Possible cholangitis. Blood culture thus far with GNRs, these are also seen in urine. On Cipro and Vanc.  10/06/13 ERCP with sphincterotomy and extraction of 2 stones from CBD. Dilated CBD and intrahepatic ducts noted.  LFTs steadily improving .  Afebrile, WBCs  improved.  * Cholelithiasis with HIDA suggestive of cholecystitis.  Per surgery note: pursue percutaneous drainage, orders in place. .   * CKD.  * Normocytic anemia. Acute Hgb 12.6 late 07/2013, 7 weeks ago.     PLAN   *  Perc biliary drain today.   *  GI signing off, call if questions.    [name]  10/08/2013, 8:41 AM Pager: [phone]    Attending physician's note   I have taken an interval history, reviewed the chart and examined the patient. I agree with the Advanced Practitioner's note, impression and recommendations. LFTs and t bili are steadily improving. Continue IV antibiotics for several days before converting to PO. Surgery ordered a cholecystostomy tube but IR was unsuccessful in placing it today. No additional GI recommendations. GI signing off. Further plans regarding cholecystitis per surgery.   Pricilla Riffle. Fuller Plan, MD [facility]

## 2013-10-08 NOTE — Progress Notes (Signed)
Patient ID: [name], female   DOB: 24-Jun-1915, 80 y.o.   MRN: [id number] 2 Days Post-Op  Subjective: Pt feels well today, but didn't have a good night.  Denies abdominal pain.  Objective: Vital signs in last 24 hours: Temp:  [97.6 F (36.4 C)-99.6 F (37.6 C)] 98.2 F (36.8 C) (01/16 0520) Pulse Rate:  [82-128] 83 (01/16 0520) Resp:  [16-20] 18 (01/16 0520) BP: (105-154)/(43-75) 129/43 mmHg (01/16 0520) SpO2:  [92 %-100 %] 100 % (01/16 0520) Weight:  [148 lb 3.2 oz (67.223 kg)] 148 lb 3.2 oz (67.223 kg) (01/16 0500) Last BM Date: 10/07/13  Intake/Output from previous day: 01/15 0701 - 01/16 0700 In: 420 [P.O.:420] Out: -  Intake/Output this shift:    PE: Abd: soft, Nt, Nd, +BS  Lab Results:   Recent Labs  10/07/13 0749 10/08/13 0430  WBC 10.1 15.2*  HGB 9.2* 8.1*  HCT 28.2* 24.6*  PLT 213 172   BMET  Recent Labs  10/07/13 0749 10/08/13 0430  NA 138 138  K 4.2 3.9  CL 100 103  CO2 28 24  GLUCOSE 100* 81  BUN 31* 33*  CREATININE 1.53* 1.40*  CALCIUM 8.4 8.1*   PT/INR  Recent Labs  10/06/13 0609  LABPROT 13.6  INR 1.06   CMP     Component Value Date/Time   NA 138 10/08/2013 0430   K 3.9 10/08/2013 0430   CL 103 10/08/2013 0430   CO2 24 10/08/2013 0430   GLUCOSE 81 10/08/2013 0430   BUN 33* 10/08/2013 0430   CREATININE 1.40* 10/08/2013 0430   CALCIUM 8.1* 10/08/2013 0430   PROT 4.9* 10/08/2013 0430   ALBUMIN 2.2* 10/08/2013 0430   AST 138* 10/08/2013 0430   ALT 191* 10/08/2013 0430   ALKPHOS 175* 10/08/2013 0430   BILITOT 2.6* 10/08/2013 0430   GFRNONAA 31* 10/08/2013 0430   GFRAA 36* 10/08/2013 0430   Lipase     Component Value Date/Time   LIPASE 22 10/06/2013 0019       Studies/Results: Nm Hepatobiliary  10/07/2013   CLINICAL DATA:  Right upper quadrant. ERCP with sphincterotomy same day  EXAM: NUCLEAR MEDICINE HEPATOBILIARY IMAGING  TECHNIQUE: Sequential images of the abdomen were obtained out to 60 minutes following intravenous  administration of radiopharmaceutical.  COMPARISON:  DG ERCP WO/W SPHINCTEROTOMY dated 10/06/2013; US ABDOMEN COMPLETE dated 10/06/2013  RADIOPHARMACEUTICALS:  5.0 mCi Tc-10m Choletec  FINDINGS: Homogeneous uptake of radiotracer within the liver. Bile is excreted into the the common bile duct and flows into the small bowel by 15 min. Examination was continued to 120 min. There was no filling of the gallbladder over 120 min time. Patient has a morphine allergy.  IMPRESSION: 1. No filling of the gallbladder suggests obstruction of the cystic duct / acute cholecystitis.  2.  Patent common bile duct.  Findings conveyed toDr.  Marcello Moores On 10/07/2013  [id number].   Electronically Signed   By: Suzy Bouchard M.D.   On: 10/07/2013 18:02   Dg Chest Port 1 View  10/07/2013   CLINICAL DATA:  Fever, cough and chills  EXAM: PORTABLE CHEST - 1 VIEW  COMPARISON:  Prior chest x-ray 10/06/2013  FINDINGS: Stable mild cardiomegaly. Inspiratory volumes are lower. This results in crowding of the pulmonary vasculature and slight Lee increased prominence of the interstitial markings. Stable atherosclerotic and tortuous thoracic aorta. No new focal airspace consolidation. No pleural effusion or pneumothorax.  IMPRESSION: Lower inspiratory volumes results in a crowding of the pulmonary vasculature.  No  new focal airspace consolidation to suggest pneumonia.   Electronically Signed   By: Jacqulynn Cadet M.D.   On: 10/07/2013 09:58   Dg Ercp Biliary & Pancreatic Ducts  10/06/2013   CLINICAL DATA:  Common bile duct stones.  EXAM: ERCP  TECHNIQUE: Multiple spot images obtained with the fluoroscopic device and submitted for interpretation post-procedure.  COMPARISON:  Abdominal ultrasound 10/06/2013.  FINDINGS: Fluoroscopic spot images from an ERCP demonstrate cannulation of the common bile duct with subsequent injection of contrast material demonstrating dilatation of the common bile duct and multiple gallstones. Common bile duct stones are  also suspected. Subsequent sphincterotomy and balloon passage.  IMPRESSION: Cholelithiasis and choledocholithiasis with subsequent sphincterotomy and balloon passage.  These images were submitted for radiologic interpretation only. Please see the procedural report for the amount of contrast and the fluoroscopy time utilized.   Electronically Signed   By: Kalman Jewels M.D.   On: 10/06/2013 16:16    Anti-infectives: Anti-infectives   Start     Dose/Rate Route Frequency Ordered Stop   10/07/13 1200  vancomycin (VANCOCIN) 500 mg in sodium chloride 0.9 % 100 mL IVPB     500 mg 100 mL/hr over 60 Minutes Intravenous Every 24 hours 10/06/13 1002     10/07/13 0600  ciprofloxacin (CIPRO) IVPB 400 mg     400 mg 200 mL/hr over 60 Minutes Intravenous Every 24 hours 10/06/13 0337     10/06/13 1015  vancomycin (VANCOCIN) IVPB 1000 mg/200 mL premix     1,000 mg 200 mL/hr over 60 Minutes Intravenous  Once 10/06/13 1002 10/06/13 1222   10/06/13 0230  ciprofloxacin (CIPRO) IVPB 400 mg  Status:  Discontinued     400 mg 200 mL/hr over 60 Minutes Intravenous Every 12 hours 10/06/13 0216 10/06/13 0337       Assessment/Plan  1. Acute cholecystitis 2. Choledocholithiasis, s/p ERCP 3. CKD 4. HTN  Plan: 1. Given her age, at this time, we would like to pursue placement of a cholecystostomy drain for her acute cholecystitis.  I have d/w the patient.  Her daughter is not here, but the patient is comfortable making the decision on her own to proceed with placement of a perc chole tube.  I have d/w the patient that in 6-8 weeks she and Dr. Brantley Stage can discuss removing the drain and seeing how she does vs then proceeding with a lap chole.  The patient understands all of this and is agreeable to proceed.  I have d/w Dr. Grandville Silos with primary team as well.  She may have liquids after her procedure.   LOS: 3 days    Duwan Adrian E 10/08/2013, 8:14 AM Pager: [phone]

## 2013-10-08 NOTE — Progress Notes (Signed)
Given multiple co morbidities best to drain and control infection and deal with down the road.  Can eat after drain placement and I will see in 3 - 4 weeks to assess her condition and go from there.

## 2013-10-08 NOTE — Progress Notes (Signed)
[facility] PROGRESS NOTE  [name] POE:[id number] DOB: 1915-04-07 DOA: 10/05/2013 PCP: Noralee Space, MD  Assessment/Plan: #1 systemic inflammatory response Patient noted to be tachycardic with a fever yesterday. Fever has improved. Tachycardia has improved. Abdominal ultrasound which was done did show distended gallbladder containing multiple gallstones, positive sonographic Murphy sign consistent with acute cholecystitis, obstructive 0.8 cm gallstone in the distal, mild duct. HIDA scan consistent with acute cholecystitis. Patient also noted to have bilateral lower extremity cellulitis. Patient is status post ERCP with sphincterectomy. Chest x-ray neg. Blood cultures x2 pending. UA  Neg. Cultures and sensitivities pending. Lactic acid level neg.  Pro calcitonin level elevated at 6.59. Continue empiric IV vancomycin and IV ciprofloxacin. Patient for cholecystotomy drain today. CCS and GI ff.  #2 gallstones with probable acute cholecystitis and choledocholithiasis status post ERCP with sphincterectomy and stone extraction 10/06/2013 Patient is status post ERCP with sphincterectomy and stone extraction. Abdominal ultrasound which was done on admission was consistent with acute cholecystitis and multiple gallstones and nonobstructing the stone in the common bile duct. Patient noted to have fevers and is tachycardic. LFTs slowly trending down. Continue empiric IV ciprofloxacin. Patient has been seen by general surgeon HIDA scan has been ordered and consistent with acute cholecystitis. Patient for placement of cholecystostomy drain for acute cholecystitis per surgery recommendations. General surgery and GI following and appreciate input and recommendations.  #3 bilateral lower extremity cellulitis Clinical improvement. Continue empiric IV vancomycin.  Keep lower extremities elevated.  #4 chronic kidney disease stage III Stable. Follow.  #5 hypertension Continue Cardizem.  #6  hypothyroidism Continue Synthroid.  #7 gastroesophageal reflux disease Placed on a PPI.  #8 prophylaxis PPI for GI prophylaxis. SCDs for DVT prophylaxis.  Code Status: Full Family Communication: Updated patient at bedside. Disposition Plan: Home when medically stable   Consultants:  Gastroenterology: Dr. Fuller Plan 10/06/2013  General surgery: Dr. Georganna Skeans 10/06/2013  Cardiology: Dr. Gwenlyn Found 10/06/2013  Procedures:  Abdominal ultrasound 10/06/2013  Chest x-ray 10/06/2013  ERCP with sphincterectomy/papillotomy and ERCP with removal of calculi 10/06/2013 per Dr. Fuller Plan  HIDA scan 10/07/2013  Antibiotics:  IV vancomycin 10/06/2013  IV ciprofloxacin 10/06/2013  HPI/Subjective: Patient denies any abdominal pain. Patient states feeling better than yesterday. No nausea. No vomiting.  Objective: Filed Vitals:   10/08/13 0520  BP: 129/43  Pulse: 83  Temp: 98.2 F (36.8 C)  Resp: 18    Intake/Output Summary (Last 24 hours) at 10/08/13 0959 Last data filed at 10/08/13 0700  Gross per 24 hour  Intake      0 ml  Output      0 ml  Net      0 ml   Filed Weights   10/06/13 0343 10/08/13 0500  Weight: 62.9 kg (138 lb 10.7 oz) 67.223 kg (148 lb 3.2 oz)    Exam:   General:  NAD  Cardiovascular: RRR  Respiratory: CTAB anterior lung fields  Abdomen: Soft/NT/ND/+BS  Musculoskeletal: BLE with 1-2 + edema and decreased erythema and warmth  Data Reviewed: Basic Metabolic Panel:  Recent Labs Lab 10/06/13 0019 10/06/13 0609 10/07/13 0749 10/08/13 0430  NA 133* 138 138 138  K 4.0 4.2 4.2 3.9  CL 92* 98 100 103  CO2 29 27 28 24   GLUCOSE 124* 113* 100* 81  BUN 36* 33* 31* 33*  CREATININE 1.59* 1.41* 1.53* 1.40*  CALCIUM 9.1 8.7 8.4 8.1*   Liver Function Tests:  Recent Labs Lab 10/06/13 0019 10/06/13 0609 10/07/13 0749 10/08/13 0430  AST 167*  481* 390* 138*  ALT 86* 228* 318* 191*  ALKPHOS 129* 171* 209* 175*  BILITOT 2.2* 6.0* 4.4* 2.6*  PROT  7.1 6.0 5.7* 4.9*  ALBUMIN 3.6 2.9* 2.6* 2.2*    Recent Labs Lab 10/06/13 0019  LIPASE 22   No results found for this basename: AMMONIA,  in the last 168 hours CBC:  Recent Labs Lab 10/06/13 0019 10/06/13 0609 10/07/13 0749 10/08/13 0430  WBC 9.7 10.8* 10.1 15.2*  NEUTROABS 7.1  --  7.7 12.4*  HGB 10.1* 9.9* 9.2* 8.1*  HCT 31.2* 30.0* 28.2* 24.6*  MCV 95.4 93.5 97.6 95.7  PLT 190 152 213 172   Cardiac Enzymes:  Recent Labs Lab 10/06/13 0019  TROPONINI <0.30   BNP (last 3 results)  Recent Labs  10/06/13 0019  PROBNP 329.3   CBG: No results found for this basename: GLUCAP,  in the last 168 hours  Recent Results (from the past 240 hour(s))  CULTURE, BLOOD (ROUTINE X 2)     Status: None   Collection Time    10/07/13 11:08 AM      Result Value Range Status   Specimen Description BLOOD RIGHT HAND   Final   Special Requests BOTTLES DRAWN AEROBIC ONLY [facility]   Final   Culture  Setup Time     Final   Value: 10/07/2013 16:26     Performed at [facility]   Culture     Final   Value:        BLOOD CULTURE RECEIVED NO GROWTH TO DATE CULTURE WILL BE HELD FOR 5 DAYS BEFORE ISSUING A FINAL NEGATIVE REPORT     Performed at [facility]   Report Status PENDING   Incomplete  URINE CULTURE     Status: None   Collection Time    10/07/13 12:16 PM      Result Value Range Status   Specimen Description URINE, CLEAN CATCH   Final   Special Requests NONE   Final   Culture  Setup Time     Final   Value: 10/07/2013 16:47     Performed at [place]     Final   Value: 45,000 COLONIES/ML     Performed at [facility]   Culture     Final   Value: GRAM NEGATIVE RODS     Performed at [facility]   Report Status PENDING   Incomplete     Studies: Nm Hepatobiliary  10/07/2013   CLINICAL DATA:  Right upper quadrant. ERCP with sphincterotomy same day  EXAM: NUCLEAR MEDICINE HEPATOBILIARY IMAGING  TECHNIQUE: Sequential images  of the abdomen were obtained out to 60 minutes following intravenous administration of radiopharmaceutical.  COMPARISON:  DG ERCP WO/W SPHINCTEROTOMY dated 10/06/2013; US ABDOMEN COMPLETE dated 10/06/2013  RADIOPHARMACEUTICALS:  5.0 mCi Tc-42m Choletec  FINDINGS: Homogeneous uptake of radiotracer within the liver. Bile is excreted into the the common bile duct and flows into the small bowel by 15 min. Examination was continued to 120 min. There was no filling of the gallbladder over 120 min time. Patient has a morphine allergy.  IMPRESSION: 1. No filling of the gallbladder suggests obstruction of the cystic duct / acute cholecystitis.  2.  Patent common bile duct.  Findings conveyed toDr.  Marcello Moores On 10/07/2013  [id number].   Electronically Signed   By: Suzy Bouchard M.D.   On: 10/07/2013 18:02   Dg Chest Port 1 View  10/07/2013   CLINICAL  DATA:  Fever, cough and chills  EXAM: PORTABLE CHEST - 1 VIEW  COMPARISON:  Prior chest x-ray 10/06/2013  FINDINGS: Stable mild cardiomegaly. Inspiratory volumes are lower. This results in crowding of the pulmonary vasculature and slight Lee increased prominence of the interstitial markings. Stable atherosclerotic and tortuous thoracic aorta. No new focal airspace consolidation. No pleural effusion or pneumothorax.  IMPRESSION: Lower inspiratory volumes results in a crowding of the pulmonary vasculature.  No new focal airspace consolidation to suggest pneumonia.   Electronically Signed   By: Jacqulynn Cadet M.D.   On: 10/07/2013 09:58   Dg Ercp Biliary & Pancreatic Ducts  10/06/2013   CLINICAL DATA:  Common bile duct stones.  EXAM: ERCP  TECHNIQUE: Multiple spot images obtained with the fluoroscopic device and submitted for interpretation post-procedure.  COMPARISON:  Abdominal ultrasound 10/06/2013.  FINDINGS: Fluoroscopic spot images from an ERCP demonstrate cannulation of the common bile duct with subsequent injection of contrast material demonstrating dilatation of the  common bile duct and multiple gallstones. Common bile duct stones are also suspected. Subsequent sphincterotomy and balloon passage.  IMPRESSION: Cholelithiasis and choledocholithiasis with subsequent sphincterotomy and balloon passage.  These images were submitted for radiologic interpretation only. Please see the procedural report for the amount of contrast and the fluoroscopy time utilized.   Electronically Signed   By: Kalman Jewels M.D.   On: 10/06/2013 16:16    Scheduled Meds: . allopurinol  100 mg Oral Daily  . ciprofloxacin  400 mg Intravenous Q24H  . diltiazem  180 mg Oral Daily  . levothyroxine  62.5 mcg Oral QAC breakfast  . polyvinyl alcohol  1 drop Both Eyes BID  . timolol  1 drop Both Eyes BID  . vancomycin  500 mg Intravenous Q24H   Continuous Infusions: . sodium chloride 1,000 mL (10/07/13 2207)    Principal Problem:   SIRS (systemic inflammatory response syndrome) Active Problems:   Choledocholithiasis with obstruction   Cellulitis of leg: Bilateral R> L   HYPOTHYROIDISM   ANXIETY   HYPERTENSION   GERD   RENAL INSUFFICIENCY   Aortic valve disorder   Swelling of limb   Choledocholithiasis   Abdominal pain, acute, right upper quadrant   Cholecystitis, acute   Anemia   CKD (chronic kidney disease), stage III    Time spent: Trent Woods MD [facility] Pager [phone]. If 7PM-7AM, please contact night-coverage at [email or web address], password [facility] 10/08/2013, 9:59 AM  LOS: 3 days

## 2013-10-08 NOTE — Procedures (Signed)
Post-Procedure Note  Pre-operative Diagnosis: Acute cholecystitis        Post-operative Diagnosis:Cholecystitis   Indications:  Cholecystitis.   Procedure Details:   Informed consent obtained.  Gallbladder was very difficult to see with ultrasound because of stones and bowel gas.  Patient was taken to CT suite.  CT guided cholecystostomy was attempted multiple times.  Despite multiple needles and wires, unable to successfully aspirate any bile or get a catheter or wire into the gallbladder.    Findings: Thickened gallbladder with stones.  Unable to successfully place drain within gallbladder.  Wire and catheter repeated bounced off the wall and went into the perihepatic space.     Complications: None      Condition: Stable  Plan: Discussed with General Surgery.  Could attempt in future if gallbladder becomes more distended.

## 2013-10-08 NOTE — H&P (Signed)
[name] is an 80 y.o. female.   Chief Complaint: pt with onset abd pain- RUQ since 10/06/13 Came to ED for evaluation US reveals distended GB with stones- c/w cholecystitis. +HIDA Now scheduled for percutaneous cholecystostomy tube placement Pt is 80 yo--not surgical candidate for now with acute infection  HPI: HTN; hypothyroid; glaucoma; HLD; CKD  Past Medical History  Diagnosis Date  . Hypertension   . Hypothyroidism   . Glaucoma   . Acute bronchitis   . Palpitations   . Unspecified venous (peripheral) insufficiency   . Pure hypercholesterolemia   . GERD (gastroesophageal reflux disease)   . Diverticulosis of colon (without mention of hemorrhage)   . Benign neoplasm of colon   . Renal insufficiency   . DJD (degenerative joint disease)   . Vitamin D deficiency disease   . Anxiety state, unspecified   . CKD (chronic kidney disease), stage III 10/06/2013    Past Surgical History  Procedure Laterality Date  . Cataract extraction    . Abdominal hysterectomy    . Breast biopsy      Benign  . Total knee arthroplasty  1999    right  . Total knee arthroplasty  11/2008    left - Dr Lorin Mercy  . Toe amputation  08/2009    Right second toe - Dr Beola Cord  . Ercp N/Anderson 10/06/2013    Procedure: ENDOSCOPIC RETROGRADE CHOLANGIOPANCREATOGRAPHY (ERCP);  Surgeon: Ladene Artist, MD;  Location: [place];  Service: Endoscopy;  Laterality: N/Anderson;    Family History  Problem Relation Age of Onset  . Heart disease Mother   . Cancer Father     Throat  . Heart disease Father    Social History:  reports that she has never smoked. She has never used smokeless tobacco. She reports that she does not drink alcohol or use illicit drugs.  Allergies:  Allergies  Allergen Reactions  . Enablex [Darifenacin Hydrobromide Er]     Caused her throat and mouth to have bumps and feel like it was swelling  . Clarithromycin Swelling    REACTION: causes her mouth to swell  . Codeine Other (See Comments)     Makes pt nervous  . Morphine Nausea And Vomiting    REACTION: n/v  . Sulfonamide Derivatives Other (See Comments)    REACTION: unsure of reaction    Medications Prior to Admission  Medication Sig Dispense Refill  . acetaminophen (TYLENOL) 325 MG tablet Per bottle as needed      . allopurinol (ZYLOPRIM) 100 MG tablet Take 100 mg by mouth daily.        [name] ALPRAZolam (XANAX) 0.25 MG tablet TAKE 1/2 TO 1 TABLET 3 TIMES Anderson DAY AS NEEDED  90 tablet  3  . Ascorbic Acid (VITAMIN C) 500 MG tablet Take 500 mg by mouth daily.        [name] aspirin 81 MG tablet Take 81 mg by mouth daily.        . Cholecalciferol (VITAMIN D) 1000 UNITS capsule Take 1,000 Units by mouth daily.        [name] diltiazem (CARDIZEM CD) 180 MG 24 hr capsule TAKE 1 CAPSULE DAILY  90 capsule  3  . furosemide (LASIX) 40 MG tablet TAKE 2 TABLETS BY MOUTH ONCE EVERY MORNING  180 tablet  3  . levothyroxine (SYNTHROID, LEVOTHROID) 125 MCG tablet Take 1/2 tablet By mouth once daily  90 tablet  3  . omeprazole (PRILOSEC) 20 MG capsule Take 20 mg by mouth  daily.        . Polyethyl Glycol-Propyl Glycol (SYSTANE) 0.4-0.3 % SOLN One drop in each eye two times Anderson day       . simvastatin (ZOCOR) 20 MG tablet TAKE 1 TAB BY MOUTH AT BEDTIME  90 tablet  3  . timolol (TIMOPTIC) 0.5 % ophthalmic solution Place 1 drop into both eyes 2 (two) times daily.        . traMADol (ULTRAM) 50 MG tablet Take 1 tablet (50 mg total) by mouth 3 (three) times daily as needed.  90 tablet  5  . triamcinolone (KENALOG) 0.025 % cream Apply 1 application topically Twice daily.        Results for orders placed during the hospital encounter of 10/05/13 (from the past 48 hour(s))  COMPREHENSIVE METABOLIC PANEL     Status: Abnormal   Collection Time    10/07/13  7:49 AM      Result Value Range   Sodium 138  137 - 147 mEq/L   Potassium 4.2  3.7 - 5.3 mEq/L   Chloride 100  96 - 112 mEq/L   CO2 28  19 - 32 mEq/L   Glucose, Bld 100 (*) 70 - 99 mg/dL   BUN 31 (*) 6 - 23  mg/dL   Creatinine, Ser 1.53 (*) 0.50 - 1.10 mg/dL   Calcium 8.4  8.4 - 10.5 mg/dL   Total Protein 5.7 (*) 6.0 - 8.3 g/dL   Albumin 2.6 (*) 3.5 - 5.2 g/dL   AST 390 (*) 0 - 37 U/L   ALT 318 (*) 0 - 35 U/L   Alkaline Phosphatase 209 (*) 39 - 117 U/L   Total Bilirubin 4.4 (*) 0.3 - 1.2 mg/dL   GFR calc non Af Amer 28 (*) >90 mL/min   GFR calc Af Amer 32 (*) >90 mL/min   Comment: (NOTE)     The eGFR has been calculated using the CKD EPI equation.     This calculation has not been validated in all clinical situations.     eGFR's persistently <90 mL/min signify possible Chronic Kidney     Disease.  CBC WITH DIFFERENTIAL     Status: Abnormal   Collection Time    10/07/13  7:49 AM      Result Value Range   WBC 10.1  4.0 - 10.5 K/uL   RBC 2.89 (*) 3.87 - 5.11 MIL/uL   Hemoglobin 9.2 (*) 12.0 - 15.0 g/dL   HCT 28.2 (*) 36.0 - 46.0 %   MCV 97.6  78.0 - 100.0 fL   MCH 31.8  26.0 - 34.0 pg   MCHC 32.6  30.0 - 36.0 g/dL   RDW 16.8 (*) 11.5 - 15.5 %   Platelets 213  150 - 400 K/uL   Neutrophils Relative % 77  43 - 77 %   Neutro Abs 7.7  1.7 - 7.7 K/uL   Lymphocytes Relative 14  12 - 46 %   Lymphs Abs 1.4  0.7 - 4.0 K/uL   Monocytes Relative 8  3 - 12 %   Monocytes Absolute 0.8  0.1 - 1.0 K/uL   Eosinophils Relative 1  0 - 5 %   Eosinophils Absolute 0.1  0.0 - 0.7 K/uL   Basophils Relative 0  0 - 1 %   Basophils Absolute 0.0  0.0 - 0.1 K/uL  CULTURE, BLOOD (ROUTINE X 2)     Status: None   Collection Time    10/07/13 11:08 AM  Result Value Range   Specimen Description BLOOD RIGHT HAND     Special Requests BOTTLES DRAWN AEROBIC ONLY [place]     Culture  Setup Time       Value: 10/07/2013 16:26     Performed at [facility]   Culture       Value:        BLOOD CULTURE RECEIVED NO GROWTH TO DATE CULTURE WILL BE HELD FOR 5 DAYS BEFORE ISSUING Anderson FINAL NEGATIVE REPORT     Performed at [facility]   Report Status PENDING    LACTIC ACID, PLASMA     Status: None    Collection Time    10/07/13 11:08 AM      Result Value Range   Lactic Acid, Venous 1.8  0.5 - 2.2 mmol/L  PROCALCITONIN     Status: None   Collection Time    10/07/13 11:08 AM      Result Value Range   Procalcitonin 6.59     Comment:            Interpretation:     PCT > 2 ng/mL:     Systemic infection (sepsis) is likely,     unless other causes are known.     (NOTE)             ICU PCT Algorithm               Non ICU PCT Algorithm        ----------------------------     ------------------------------             PCT < 0.25 ng/mL                 PCT < 0.1 ng/mL         Stopping of antibiotics            Stopping of antibiotics           strongly encouraged.               strongly encouraged.        ----------------------------     ------------------------------           PCT level decrease by               PCT < 0.25 ng/mL           >= 80% from peak PCT           OR PCT 0.25 - 0.5 ng/mL          Stopping of antibiotics                                                 encouraged.         Stopping of antibiotics               encouraged.        ----------------------------     ------------------------------           PCT level decrease by              PCT >= 0.25 ng/mL           < 80% from peak PCT            AND PCT >= 0.5 ng/mL  Continuing antibiotics                                                  encouraged.           Continuing antibiotics                encouraged.        ----------------------------     ------------------------------         PCT level increase compared          PCT > 0.5 ng/mL             with peak PCT AND              PCT >= 0.5 ng/mL             Escalation of antibiotics                                              strongly encouraged.          Escalation of antibiotics            strongly encouraged.  URINALYSIS, ROUTINE W REFLEX MICROSCOPIC     Status: Abnormal   Collection Time    10/07/13 12:16 PM      Result Value Range   Color, Urine  AMBER (*) YELLOW   Comment: BIOCHEMICALS MAY BE AFFECTED BY COLOR   APPearance HAZY (*) CLEAR   Specific Gravity, Urine 1.013  1.005 - 1.030   pH 5.5  5.0 - 8.0   Glucose, UA NEGATIVE  NEGATIVE mg/dL   Hgb urine dipstick TRACE (*) NEGATIVE   Bilirubin Urine MODERATE (*) NEGATIVE   Ketones, ur NEGATIVE  NEGATIVE mg/dL   Protein, ur 30 (*) NEGATIVE mg/dL   Urobilinogen, UA 2.0 (*) 0.0 - 1.0 mg/dL   Nitrite NEGATIVE  NEGATIVE   Leukocytes, UA TRACE (*) NEGATIVE  URINE CULTURE     Status: None   Collection Time    10/07/13 12:16 PM      Result Value Range   Specimen Description URINE, CLEAN CATCH     Special Requests NONE     Culture  Setup Time       Value: 10/07/2013 16:47     Performed at [facility]       Value: 45,000 COLONIES/ML     Performed at [facility]   Culture       Value: [place]     Performed at [facility]   Report Status PENDING    URINE MICROSCOPIC-ADD ON     Status: Abnormal   Collection Time    10/07/13 12:16 PM      Result Value Range   Squamous Epithelial / LPF FEW (*) RARE   WBC, UA 0-2  <3 WBC/hpf   RBC / HPF 3-6  <3 RBC/hpf   Bacteria, UA RARE  RARE   Casts GRANULAR CAST (*) NEGATIVE   Comment: HYALINE CASTS   Urine-Other AMORPHOUS URATES/PHOSPHATES    COMPREHENSIVE METABOLIC PANEL     Status: Abnormal   Collection Time    10/08/13  4:30 AM      Result Value Range  Sodium 138  137 - 147 mEq/L   Potassium 3.9  3.7 - 5.3 mEq/L   Chloride 103  96 - 112 mEq/L   CO2 24  19 - 32 mEq/L   Glucose, Bld 81  70 - 99 mg/dL   BUN 33 (*) 6 - 23 mg/dL   Creatinine, Ser 1.40 (*) 0.50 - 1.10 mg/dL   Calcium 8.1 (*) 8.4 - 10.5 mg/dL   Total Protein 4.9 (*) 6.0 - 8.3 g/dL   Albumin 2.2 (*) 3.5 - 5.2 g/dL   AST 138 (*) 0 - 37 U/L   ALT 191 (*) 0 - 35 U/L   Alkaline Phosphatase 175 (*) 39 - 117 U/L   Total Bilirubin 2.6 (*) 0.3 - 1.2 mg/dL   GFR calc non Af Amer 31 (*) >90 mL/min   GFR calc Af Amer 36 (*)  >90 mL/min   Comment: (NOTE)     The eGFR has been calculated using the CKD EPI equation.     This calculation has not been validated in all clinical situations.     eGFR's persistently <90 mL/min signify possible Chronic Kidney     Disease.  CBC WITH DIFFERENTIAL     Status: Abnormal   Collection Time    10/08/13  4:30 AM      Result Value Range   WBC 15.2 (*) 4.0 - 10.5 K/uL   RBC 2.57 (*) 3.87 - 5.11 MIL/uL   Hemoglobin 8.1 (*) 12.0 - 15.0 g/dL   HCT 24.6 (*) 36.0 - 46.0 %   MCV 95.7  78.0 - 100.0 fL   MCH 31.5  26.0 - 34.0 pg   MCHC 32.9  30.0 - 36.0 g/dL   RDW 17.2 (*) 11.5 - 15.5 %   Platelets 172  150 - 400 K/uL   Neutrophils Relative % 81 (*) 43 - 77 %   Neutro Abs 12.4 (*) 1.7 - 7.7 K/uL   Lymphocytes Relative 10 (*) 12 - 46 %   Lymphs Abs 1.5  0.7 - 4.0 K/uL   Monocytes Relative 8  3 - 12 %   Monocytes Absolute 1.2 (*) 0.1 - 1.0 K/uL   Eosinophils Relative 1  0 - 5 %   Eosinophils Absolute 0.2  0.0 - 0.7 K/uL   Basophils Relative 0  0 - 1 %   Basophils Absolute 0.0  0.0 - 0.1 K/uL   Nm Hepatobiliary  10/07/2013   CLINICAL DATA:  Right upper quadrant. ERCP with sphincterotomy same day  EXAM: NUCLEAR MEDICINE HEPATOBILIARY IMAGING  TECHNIQUE: Sequential images of the abdomen were obtained out to 60 minutes following intravenous administration of radiopharmaceutical.  COMPARISON:  DG ERCP WO/W SPHINCTEROTOMY dated 10/06/2013; US ABDOMEN COMPLETE dated 10/06/2013  RADIOPHARMACEUTICALS:  5.0 mCi Tc-23mCholetec  FINDINGS: Homogeneous uptake of radiotracer within the liver. Bile is excreted into the the common bile duct and flows into the small bowel by 15 min. Examination was continued to 120 min. There was no filling of the gallbladder over 120 min time. Patient has Anderson morphine allergy.  IMPRESSION: 1. No filling of the gallbladder suggests obstruction of the cystic duct / acute cholecystitis.  2.  Patent common bile duct.  Findings conveyed toDr.  TMarcello MooresOn 10/07/2013  [id number]    Electronically Signed   By: SSuzy BouchardM.D.   On: 10/07/2013 18:02   Dg Chest Port 1 View  10/07/2013   CLINICAL DATA:  Fever, cough and chills  EXAM: PORTABLE CHEST - 1 VIEW  COMPARISON:  Prior chest x-ray 10/06/2013  FINDINGS: Stable mild cardiomegaly. Inspiratory volumes are lower. This results in crowding of the pulmonary vasculature and slight Lee increased prominence of the interstitial markings. Stable atherosclerotic and tortuous thoracic aorta. No new focal airspace consolidation. No pleural effusion or pneumothorax.  IMPRESSION: Lower inspiratory volumes results in Anderson crowding of the pulmonary vasculature.  No new focal airspace consolidation to suggest pneumonia.   Electronically Signed   By: Jacqulynn Cadet M.D.   On: 10/07/2013 09:58   Dg Ercp Biliary & Pancreatic Ducts  10/06/2013   CLINICAL DATA:  Common bile duct stones.  EXAM: ERCP  TECHNIQUE: Multiple spot images obtained with the fluoroscopic device and submitted for interpretation post-procedure.  COMPARISON:  Abdominal ultrasound 10/06/2013.  FINDINGS: Fluoroscopic spot images from an ERCP demonstrate cannulation of the common bile duct with subsequent injection of contrast material demonstrating dilatation of the common bile duct and multiple gallstones. Common bile duct stones are also suspected. Subsequent sphincterotomy and balloon passage.  IMPRESSION: Cholelithiasis and choledocholithiasis with subsequent sphincterotomy and balloon passage.  These images were submitted for radiologic interpretation only. Please see the procedural report for the amount of contrast and the fluoroscopy time utilized.   Electronically Signed   By: Kalman Jewels M.D.   On: 10/06/2013 16:16    Review of Systems  Constitutional: Negative for fever and weight loss.  Respiratory: Negative for shortness of breath.   Cardiovascular: Negative for chest pain.  Gastrointestinal: Positive for nausea and abdominal pain.  Neurological: Positive for  weakness. Negative for headaches.    Blood pressure 129/43, pulse 83, temperature 98.2 F (36.8 C), temperature source Oral, resp. rate 18, height '5\' 3"'  (1.6 m), weight 148 lb 3.2 oz (67.223 kg), SpO2 100.00%. Physical Exam  Constitutional: She is oriented to person, place, and time. She appears well-developed.  Cardiovascular: Normal rate and regular rhythm.   No murmur heard. Respiratory: Effort normal and breath sounds normal. She has no wheezes.  GI: Soft. Bowel sounds are normal. There is tenderness.  Musculoskeletal: Normal range of motion.  Neurological: She is alert and oriented to person, place, and time.  Skin: Skin is warm.  Psychiatric: She has Anderson normal mood and affect. Her behavior is normal. Judgment and thought content normal.     Assessment/Plan RUQ pain Acute cholecystitis +HIDA scheduled for perc chole in IR Pt and dtr aware of procedure benefits and risks and agreeable to tporceed Consent signed and in chart [place] and [place] good  [name] 10/08/2013, 9:10 AM

## 2013-10-09 LAB — COMPREHENSIVE METABOLIC PANEL
ALBUMIN: 2.5 g/dL — AB (ref 3.5–5.2)
ALT: 137 U/L — ABNORMAL HIGH (ref 0–35)
AST: 54 U/L — ABNORMAL HIGH (ref 0–37)
Alkaline Phosphatase: 171 U/L — ABNORMAL HIGH (ref 39–117)
BUN: 26 mg/dL — AB (ref 6–23)
CALCIUM: 8.5 mg/dL (ref 8.4–10.5)
CO2: 24 mEq/L (ref 19–32)
CREATININE: 1.11 mg/dL — AB (ref 0.50–1.10)
Chloride: 104 mEq/L (ref 96–112)
GFR calc Af Amer: 47 mL/min — ABNORMAL LOW (ref >90)
GFR calc non Af Amer: 41 mL/min — ABNORMAL LOW (ref >90)
Glucose, Bld: 111 mg/dL — ABNORMAL HIGH (ref 70–99)
Potassium: 3.9 mEq/L (ref 3.7–5.3)
Sodium: 140 mEq/L (ref 137–147)
TOTAL PROTEIN: 5.6 g/dL — AB (ref 6.0–8.3)
Total Bilirubin: 1.8 mg/dL — ABNORMAL HIGH (ref 0.3–1.2)

## 2013-10-09 LAB — CBC WITH DIFFERENTIAL/PLATELET
Basophils Absolute: 0 10*3/uL (ref 0.0–0.1)
Basophils Relative: 0 % (ref 0–1)
Eosinophils Absolute: 0.2 10*3/uL (ref 0.0–0.7)
Eosinophils Relative: 2 % (ref 0–5)
HEMATOCRIT: 27.3 % — AB (ref 36.0–46.0)
Hemoglobin: 8.7 g/dL — ABNORMAL LOW (ref 12.0–15.0)
LYMPHS ABS: 1.4 10*3/uL (ref 0.7–4.0)
LYMPHS PCT: 18 % (ref 12–46)
MCH: 31.3 pg (ref 26.0–34.0)
MCHC: 31.9 g/dL (ref 30.0–36.0)
MCV: 98.2 fL (ref 78.0–100.0)
MONO ABS: 0.7 10*3/uL (ref 0.1–1.0)
Monocytes Relative: 9 % (ref 3–12)
Neutro Abs: 5.7 10*3/uL (ref 1.7–7.7)
Neutrophils Relative %: 71 % (ref 43–77)
PLATELETS: 208 10*3/uL (ref 150–400)
RBC: 2.78 MIL/uL — AB (ref 3.87–5.11)
RDW: 17.3 % — ABNORMAL HIGH (ref 11.5–15.5)
WBC: 8 10*3/uL (ref 4.0–10.5)

## 2013-10-09 LAB — URINE CULTURE: Colony Count: 45000

## 2013-10-09 LAB — PROCALCITONIN: Procalcitonin: 55.24 ng/mL

## 2013-10-09 MED ORDER — PANTOPRAZOLE SODIUM 40 MG PO TBEC
40.0000 mg | DELAYED_RELEASE_TABLET | Freq: Every day | ORAL | Status: DC
  Administered 2013-10-09 – 2013-10-11 (×3): 40 mg via ORAL
  Filled 2013-10-09 (×3): qty 1

## 2013-10-09 MED ORDER — GI COCKTAIL ~~LOC~~
30.0000 mL | Freq: Three times a day (TID) | ORAL | Status: DC | PRN
  Administered 2013-10-09: 30 mL via ORAL
  Filled 2013-10-09 (×3): qty 30

## 2013-10-09 MED ORDER — SODIUM CHLORIDE 0.9 % IV SOLN: INTRAVENOUS | Status: DC

## 2013-10-09 MED ORDER — POLYETHYLENE GLYCOL 3350 17 G PO PACK
17.0000 g | PACK | Freq: Every day | ORAL | Status: DC
  Administered 2013-10-09 – 2013-10-11 (×3): 17 g via ORAL
  Filled 2013-10-09 (×3): qty 1

## 2013-10-09 NOTE — Progress Notes (Signed)
3 Days Post-Op  Subjective: She complains only of some pain on right flank. No abdominal pain or nausea  Objective: Vital signs in last 24 hours: Temp:  [97.4 F (36.3 C)-98.4 F (36.9 C)] 98.1 F (36.7 C) (01/17 5284) Pulse Rate:  [75-107] 75 (01/16 2017) Resp:  [12-20] 18 (01/17 0611) BP: (124-151)/(47-65) 141/47 mmHg (01/17 0611) SpO2:  [94 %-100 %] 94 % (01/17 0611) Weight:  [148 lb 14.4 oz (67.541 kg)] 148 lb 14.4 oz (67.541 kg) (01/17 0500) Last BM Date: 10/07/13  Intake/Output from previous day: 01/16 0701 - 01/17 0700 In: 400 [P.O.:400] Out: -  Intake/Output this shift: Total I/O In: 360 [P.O.:360] Out: -   Resp: clear to auscultation bilaterally Cardio: regular rate and rhythm GI: soft, mild RUQ pain  Lab Results:   Recent Labs  10/08/13 0430 10/09/13 0906  WBC 15.2* 8.0  HGB 8.1* 8.7*  HCT 24.6* 27.3*  PLT 172 208   BMET  Recent Labs  10/08/13 0430 10/09/13 0906  NA 138 140  K 3.9 3.9  CL 103 104  CO2 24 24  GLUCOSE 81 111*  BUN 33* 26*  CREATININE 1.40* 1.11*  CALCIUM 8.1* 8.5   PT/INR No results found for this basename: LABPROT, INR,  in the last 72 hours ABG No results found for this basename: PHART, PCO2, PO2, HCO3,  in the last 72 hours  Studies/Results: Nm Hepatobiliary  10/07/2013   CLINICAL DATA:  Right upper quadrant. ERCP with sphincterotomy same day  EXAM: NUCLEAR MEDICINE HEPATOBILIARY IMAGING  TECHNIQUE: Sequential images of the abdomen were obtained out to 60 minutes following intravenous administration of radiopharmaceutical.  COMPARISON:  DG ERCP WO/W SPHINCTEROTOMY dated 10/06/2013; US ABDOMEN COMPLETE dated 10/06/2013  RADIOPHARMACEUTICALS:  5.0 mCi Tc-68m Choletec  FINDINGS: Homogeneous uptake of radiotracer within the liver. Bile is excreted into the the common bile duct and flows into the small bowel by 15 min. Examination was continued to 120 min. There was no filling of the gallbladder over 120 min time. Patient has a  morphine allergy.  IMPRESSION: 1. No filling of the gallbladder suggests obstruction of the cystic duct / acute cholecystitis.  2.  Patent common bile duct.  Findings conveyed toDr.  Marcello Moores On 10/07/2013  [id number].   Electronically Signed   By: Suzy Bouchard M.D.   On: 10/07/2013 18:02   Ct Image Guided Drainage By Percutaneous Catheter  10/08/2013   CLINICAL DATA:  80 year old with bile duct stones and concern for cholecystitis. Request for a percutaneous cholecystostomy tube placement.  EXAM: ATTEMPTED CT-GUIDED CHOLECYSTOSTOMY TUBE PLACEMENT  ANESTHESIA/SEDATION: 6.0 Mg IV Versed 200 mcg IV Fentanyl  Total Moderate Sedation Time:  1 hr and 45 min  PROCEDURE: The procedure, risks, benefits, and alternatives were explained to the patient. Questions regarding the procedure were encouraged and answered. The patient understands and consents to the procedure.  The right upper quadrant was evaluated with ultrasound. Gallbladder could not be well-visualized with ultrasound due to stones and bowel gas. As result, the patient was transferred to the CT suite. Patient was placed on the CT scanner. Images through the abdomen were obtained. The right upper abdomen was prepped and draped in sterile fashion. Maximal barrier sterile technique was utilized including caps, mask, sterile gowns, sterile gloves, sterile drape, hand hygiene and skin antiseptic. The skin was anesthetized with 1% lidocaine. An 18 gauge needle was directed towards the gallbladder multiple times with CT guidance. Needle appeared to be within or near the gallbladder wall on multiple  occasions but unable to aspirate any bile or advance wire in the gallbladder. As result, the 18 gauge needle was exchanged for a 21 gauge needle. Again, a 21 gauge needle was directed into the gallbladder from a transhepatic approach. It appeared that the needle was within the gallbladder but it was difficult to advance a wire. At one point, it appeared that a wire was  within the gallbladder and an Accustick dilator set was placed. A 10 [place] multipurpose drain was advanced over a J-wire but the tube appeared to be in the pericholecystic space. Unable to aspirate any fluid from this drain. As result, this drain was removed.  The patient was re-prepped and draped. 18 gauge needle was directed towards the gallbladder fundus again with CT guidance. Again, needle would not successfully advance through the wall. Again, a 21 gauge needle was directed towards the gallbladder fundus with CT guidance. The needle appeared to be through the wall but when a wire was advanced, the wire would be in the perihepatic space. At this point, the procedure was stopped.  COMPLICATIONS: None  FINDINGS: Gallbladder appears to be markedly thickening with stones. On multiple occasions, a needle appeared to be within the gallbladder lumen. When the 10 [place] drain was placed, the tube appeared to be outside of the gallbladder lumen and no fluid could be aspirated from this drain. As result, this tube was removed.  IMPRESSION: Unsuccessful percutaneous cholecystostomy tube placement. The procedure was likely unsuccessful because of the thickened gallbladder and insufficient distention of the gallbladder.   Electronically Signed   By: Markus Daft M.D.   On: 10/08/2013 14:33    Anti-infectives: Anti-infectives   Start     Dose/Rate Route Frequency Ordered Stop   10/07/13 1200  vancomycin (VANCOCIN) 500 mg in sodium chloride 0.9 % 100 mL IVPB     500 mg 100 mL/hr over 60 Minutes Intravenous Every 24 hours 10/06/13 1002     10/07/13 0600  ciprofloxacin (CIPRO) IVPB 400 mg     400 mg 200 mL/hr over 60 Minutes Intravenous Every 24 hours 10/06/13 0337     10/06/13 1015  vancomycin (VANCOCIN) IVPB 1000 mg/200 mL premix     1,000 mg 200 mL/hr over 60 Minutes Intravenous  Once 10/06/13 1002 10/06/13 1222   10/06/13 0230  ciprofloxacin (CIPRO) IVPB 400 mg  Status:  Discontinued     400 mg 200 mL/hr  over 60 Minutes Intravenous Every 12 hours 10/06/13 0216 10/06/13 [id number]      Assessment/Plan: s/p Procedure(s): ENDOSCOPIC RETROGRADE CHOLANGIOPANCREATOGRAPHY (ERCP) (N/A) Advance diet Drain procedure was not successful. Since she is feeling better i still think it would be reasonable to keep her on abx and let her continue to improve.  LOS: 4 days    TOTH III,PAUL S 10/09/2013

## 2013-10-09 NOTE — Evaluation (Signed)
Physical Therapy Evaluation Patient Details Name: [name] MRN: [id number] DOB: 06-27-1915 Today's Date: 10/09/2013 Time: 7353-2992 PT Time Calculation (min): 15 min  PT Assessment / Plan / Recommendation History of Present Illness  80 yo female highly functioinal lives at home started having abd pain. + obstruction of the cystic duct / acute cholecystitis. Surgery following.  Clinical Impression  Patient evaluated by Physical Therapy with no further acute PT needs identified. Multiple family members present and in agreement pt has necessary support at home. PT is signing off. Thank you for this referral.     PT Assessment  Patent does not need any further PT services    Follow Up Recommendations  No PT follow up    Does the patient have the potential to tolerate intense rehabilitation      Barriers to Discharge        Equipment Recommendations  None recommended by PT    Recommendations for Other Services     Frequency      Precautions / Restrictions     Pertinent Vitals/Pain Slight Rt flank pain with mobility      Mobility  Bed Mobility Overal bed mobility: Modified Independent Transfers Overall transfer level: Modified independent Equipment used: Rolling walker (2 wheeled) Ambulation/Gait Ambulation/Gait assistance: Modified independent (Device/Increase time) Ambulation Distance (Feet): 4 Feet Assistive device: Rolling walker (2 wheeled) Gait Pattern/deviations: Step-through pattern General Gait Details: Pt only wanted to walk to chair due to dinner had arrived. Family present and report she has not had problems with loss of balance.    Exercises     PT Diagnosis:    PT Problem List:   PT Treatment Interventions:       PT Goals(Current goals can be found in the care plan section) Acute Rehab PT Goals PT Goal Formulation: No goals set, d/c therapy  Visit Information  Last PT Received On: 10/09/13 Assistance Needed: +1 History of Present  Illness: 80 yo female highly functioinal lives at home started having abd pain. + obstruction of the cystic duct / acute cholecystitis. Surgery following.       Prior [place] expects to be discharged to:: Private residence Living Arrangements: Children Available Help at Discharge: Family;Available 24 hours/day Type of Home: House Home Access: Stairs to enter [employer] of Steps: 3 Entrance Stairs-Rails: Right Home Layout: One level Home Equipment: Walker - 2 wheels;Cane - single point;Toilet riser;Other (comment);Shower seat (sink beside toilet) Prior Function Level of Independence: Needs assistance Gait / Transfers Assistance Needed: modified independent ADL's / Homemaking Assistance Needed: has been doing sponge bath for several weeks due to leg weakness; assist with housework Communication Communication: No difficulties    Cognition  Cognition Arousal/Alertness: Awake/alert Behavior During Therapy: WFL for tasks assessed/performed Overall Cognitive Status: Within Functional Limits for tasks assessed    Extremity/Trunk Assessment Upper Extremity Assessment Upper Extremity Assessment: Overall WFL for tasks assessed Lower Extremity Assessment Lower Extremity Assessment: Overall WFL for tasks assessed Cervical / Trunk Assessment Cervical / Trunk Assessment: Normal   Balance General Comments General comments (skin integrity, edema, etc.): Pt denies any problems with mobility other than performing "bird baths" for past several weeks due to leg weakness and wanting to be safe. Reports she feels best now she has felt and is not interested in [facility] or aide to assist. She has excellent family support.  End of Session PT - End of Session Activity Tolerance: Patient tolerated treatment well Patient left: in chair;with call bell/phone within reach;with  family/visitor present Nurse Communication: Mobility status  GP     Erek Kowal 10/09/2013,  5:45 PM Pager [phone]

## 2013-10-09 NOTE — Progress Notes (Signed)
ANTIBIOTIC CONSULT NOTE - Follow-Up  Pharmacy Consult:  Vancomycin Indication:  Cellulitis  Allergies  Allergen Reactions  . Enablex [Darifenacin Hydrobromide Er]     Caused her throat and mouth to have bumps and feel like it was swelling  . Clarithromycin Swelling    REACTION: causes her mouth to swell  . Codeine Other (See Comments)    Makes pt nervous  . Morphine Nausea And Vomiting    REACTION: n/v  . Sulfonamide Derivatives Other (See Comments)    REACTION: unsure of reaction    Patient Measurements: Height: 5\' 3"  (160 cm) Weight: 148 lb 14.4 oz (67.541 kg) IBW/kg (Calculated) : 52.4  Vital Signs: Temp: 98.1 F (36.7 C) (01/17 0611) Temp src: Oral (01/17 0611) BP: 141/47 mmHg (01/17 0611)  Labs:  Recent Labs  10/07/13 0749 10/08/13 0430 10/09/13 0906  WBC 10.1 15.2* 8.0  HGB 9.2* 8.1* 8.7*  PLT 213 172 208  CREATININE 1.53* 1.40* 1.11*   Estimated Creatinine Clearance: 27.3 ml/min (by C-G formula based on Cr of 1.11). No results found for this basename: VANCOTROUGH, VANCOPEAK, VANCORANDOM, [place], GENTPEAK, GENTRANDOM, [place], TOBRAPEAK, TOBRARND, AMIKACINPEAK, AMIKACINTROU, AMIKACIN,  in the last 72 hours   Microbiology: Recent Results (from the past 720 hour(s))  CULTURE, BLOOD (ROUTINE X 2)     Status: None   Collection Time    10/07/13 11:08 AM      Result Value Range Status   Specimen Description BLOOD RIGHT HAND   Final   Special Requests BOTTLES DRAWN AEROBIC ONLY [facility]   Final   Culture  Setup Time     Final   Value: 10/07/2013 16:26     Performed at [facility]   Culture     Final   Value:        BLOOD CULTURE RECEIVED NO GROWTH TO DATE CULTURE WILL BE HELD FOR 5 DAYS BEFORE ISSUING A FINAL NEGATIVE REPORT     Performed at [facility]   Report Status PENDING   Incomplete  URINE CULTURE     Status: None   Collection Time    10/07/13 12:16 PM      Result Value Range Status   Specimen Description URINE, CLEAN CATCH    Final   Special Requests NONE   Final   Culture  Setup Time     Final   Value: 10/07/2013 16:47     Performed at [place]     Final   Value: 45,000 COLONIES/ML     Performed at [facility]   Culture     Final   Value: [facility] MORGANII     Performed at [facility]   Report Status 10/09/2013 FINAL   Final   Organism ID, Bacteria MORGANELLA MORGANII   Final    Medical History: Past Medical History  Diagnosis Date  . Hypertension   . Hypothyroidism   . Glaucoma   . Acute bronchitis   . Palpitations   . Unspecified venous (peripheral) insufficiency   . Pure hypercholesterolemia   . GERD (gastroesophageal reflux disease)   . Diverticulosis of colon (without mention of hemorrhage)   . Benign neoplasm of colon   . Renal insufficiency   . DJD (degenerative joint disease)   . Vitamin D deficiency disease   . Anxiety state, unspecified   . CKD (chronic kidney disease), stage III 10/06/2013      Assessment: 70 YOF admitted with abdominal pain and found to  have choledocholithiasis and bilateral LE cellulitis.  She is already started on Cipro to cover for GI source, then added vancomycin for cellulitis.  She has CKD at baseline.  Vanc 1/14 >> Cipro 1/14 >>  UCx growing morganella morganii, but only 45K/ml.  Goal of Therapy:  Vancomycin trough level 10-15 mcg/ml   Plan:  - Continue vancomycin 500 mg IV q 24 hrs.  Will consider checking trough soon if vancomycin continues. - Cipro 400mg  IV Q24H as ordered  Uvaldo Rising, BCPS  Clinical Pharmacist Pager [phone]  10/09/2013 2:00 PM

## 2013-10-09 NOTE — Progress Notes (Signed)
[facility] PROGRESS NOTE  [name] [id number] DOB: 03/26/1915 DOA: 10/05/2013 PCP: Noralee Space, MD  Assessment/Plan: #1 systemic inflammatory response Patient noted to be tachycardic with a fever yesterday. Fever has improved. Tachycardia has improved. Abdominal ultrasound which was done did show distended gallbladder containing multiple gallstones, positive sonographic Murphy sign consistent with acute cholecystitis, obstructive 0.8 cm gallstone in the distal, mild duct. HIDA scan consistent with acute cholecystitis. Patient also noted to have bilateral lower extremity cellulitis. Patient is status post ERCP with sphincterectomy. Chest x-ray neg. Blood cultures x2 pending. UA  Neg. Cultures and sensitivities pending. Lactic acid level neg.  Pro calcitonin level elevated at 6.59. WBC trending down. Continue empiric IV vancomycin and IV ciprofloxacin. Patient with unsuccessful placement of cholecystotomy drain. CCS and GI ff.  #2 gallstones with probable acute cholecystitis and choledocholithiasis status post ERCP with sphincterectomy and stone extraction 10/06/2013 Patient is status post ERCP with sphincterectomy and stone extraction. Abdominal ultrasound which was done on admission was consistent with acute cholecystitis and multiple gallstones and nonobstructing the stone in the common bile duct. Patient noted to have fevers and is tachycardic. LFTs slowly trending down. Continue empiric IV ciprofloxacin. Patient has been seen by general surgeon HIDA scan has been ordered and consistent with acute cholecystitis. Patient with unsuccessful  placement of cholecystostomy drain for acute cholecystitis. Continue IV antibiotics. General surgery and GI following and appreciate input and recommendations.  #3 bilateral lower extremity cellulitis Clinical improvement. Continue empiric IV vancomycin.  Keep lower extremities elevated.  #4 chronic kidney disease stage III Stable. KVO.  Follow.  #5 hypertension Continue Cardizem.  #6 hypothyroidism Continue Synthroid.  #7 gastroesophageal reflux disease Placed on a PPI.  #8 prophylaxis PPI for GI prophylaxis. SCDs for DVT prophylaxis.  Code Status: Full Family Communication: Updated patient at bedside. Disposition Plan: Home when medically stable   Consultants:  Gastroenterology: Dr. Fuller Plan 10/06/2013  General surgery: Dr. Georganna Skeans 10/06/2013  Cardiology: Dr. Gwenlyn Found 10/06/2013  Procedures:  Abdominal ultrasound 10/06/2013  Chest x-ray 10/06/2013  ERCP with sphincterectomy/papillotomy and ERCP with removal of calculi 10/06/2013 per Dr. Fuller Plan  HIDA scan 10/07/2013  Unsuccesful cholesytostomy 10/08/13  Antibiotics:  IV vancomycin 10/06/2013  IV ciprofloxacin 10/06/2013  HPI/Subjective: Patient denies any abdominal pain. Patient states feeling better than yesterday. No nausea. No vomiting.  Objective: Filed Vitals:   10/09/13 0611  BP: 141/47  Pulse:   Temp: 98.1 F (36.7 C)  Resp: 18    Intake/Output Summary (Last 24 hours) at 10/09/13 1339 Last data filed at 10/09/13 0840  Gross per 24 hour  Intake    760 ml  Output      0 ml  Net    760 ml   Filed Weights   10/06/13 0343 10/08/13 0500 10/09/13 0500  Weight: 62.9 kg (138 lb 10.7 oz) 67.223 kg (148 lb 3.2 oz) 67.541 kg (148 lb 14.4 oz)    Exam:   General:  NAD  Cardiovascular: RRR  Respiratory: CTAB anterior lung fields  Abdomen: Soft/NT/ND/+BS  Musculoskeletal: BLE with 1-2 + edema and decreased erythema and warmth  Data Reviewed: Basic Metabolic Panel:  Recent Labs Lab 10/06/13 0019 10/06/13 0609 10/07/13 0749 10/08/13 0430 10/09/13 0906  NA 133* 138 138 138 140  K 4.0 4.2 4.2 3.9 3.9  CL 92* 98 100 103 104  CO2 29 27 28 24 24   GLUCOSE 124* 113* 100* 81 111*  BUN 36* 33* 31* 33* 26*  CREATININE 1.59* 1.41* 1.53* 1.40* 1.11*  CALCIUM 9.1 8.7 8.4 8.1* 8.5   Liver Function Tests:  Recent Labs Lab  10/06/13 0019 10/06/13 0609 10/07/13 0749 10/08/13 0430 10/09/13 0906  AST 167* 481* 390* 138* 54*  ALT 86* 228* 318* 191* 137*  ALKPHOS 129* 171* 209* 175* 171*  BILITOT 2.2* 6.0* 4.4* 2.6* 1.8*  PROT 7.1 6.0 5.7* 4.9* 5.6*  ALBUMIN 3.6 2.9* 2.6* 2.2* 2.5*    Recent Labs Lab 10/06/13 0019  LIPASE 22   No results found for this basename: AMMONIA,  in the last 168 hours CBC:  Recent Labs Lab 10/06/13 0019 10/06/13 0609 10/07/13 0749 10/08/13 0430 10/09/13 0906  WBC 9.7 10.8* 10.1 15.2* 8.0  NEUTROABS 7.1  --  7.7 12.4* 5.7  HGB 10.1* 9.9* 9.2* 8.1* 8.7*  HCT 31.2* 30.0* 28.2* 24.6* 27.3*  MCV 95.4 93.5 97.6 95.7 98.2  PLT 190 152 213 172 208   Cardiac Enzymes:  Recent Labs Lab 10/06/13 0019  TROPONINI <0.30   BNP (last 3 results)  Recent Labs  10/06/13 0019  PROBNP 329.3   CBG: No results found for this basename: GLUCAP,  in the last 168 hours  Recent Results (from the past 240 hour(s))  CULTURE, BLOOD (ROUTINE X 2)     Status: None   Collection Time    10/07/13 11:08 AM      Result Value Range Status   Specimen Description BLOOD RIGHT HAND   Final   Special Requests BOTTLES DRAWN AEROBIC ONLY [facility]   Final   Culture  Setup Time     Final   Value: 10/07/2013 16:26     Performed at [facility]   Culture     Final   Value:        BLOOD CULTURE RECEIVED NO GROWTH TO DATE CULTURE WILL BE HELD FOR 5 DAYS BEFORE ISSUING A FINAL NEGATIVE REPORT     Performed at [facility]   Report Status PENDING   Incomplete  URINE CULTURE     Status: None   Collection Time    10/07/13 12:16 PM      Result Value Range Status   Specimen Description URINE, CLEAN CATCH   Final   Special Requests NONE   Final   Culture  Setup Time     Final   Value: 10/07/2013 16:47     Performed at [place]     Final   Value: 45,000 COLONIES/ML     Performed at [facility]   Culture     Final   Value: [facility] MORGANII      Performed at [facility]   Report Status 10/09/2013 FINAL   Final   Organism ID, Bacteria MORGANELLA MORGANII   Final     Studies: Nm Hepatobiliary  10/07/2013   CLINICAL DATA:  Right upper quadrant. ERCP with sphincterotomy same day  EXAM: NUCLEAR MEDICINE HEPATOBILIARY IMAGING  TECHNIQUE: Sequential images of the abdomen were obtained out to 60 minutes following intravenous administration of radiopharmaceutical.  COMPARISON:  DG ERCP WO/W SPHINCTEROTOMY dated 10/06/2013; US ABDOMEN COMPLETE dated 10/06/2013  RADIOPHARMACEUTICALS:  5.0 mCi Tc-48m Choletec  FINDINGS: Homogeneous uptake of radiotracer within the liver. Bile is excreted into the the common bile duct and flows into the small bowel by 15 min. Examination was continued to 120 min. There was no filling of the gallbladder over 120 min time. Patient has a morphine allergy.  IMPRESSION: 1. No filling of the gallbladder suggests obstruction  of the cystic duct / acute cholecystitis.  2.  Patent common bile duct.  Findings conveyed toDr.  Marcello Moores On 10/07/2013  [id number].   Electronically Signed   By: Suzy Bouchard M.D.   On: 10/07/2013 18:02   Ct Image Guided Drainage By Percutaneous Catheter  10/08/2013   CLINICAL DATA:  80 year old with bile duct stones and concern for cholecystitis. Request for a percutaneous cholecystostomy tube placement.  EXAM: ATTEMPTED CT-GUIDED CHOLECYSTOSTOMY TUBE PLACEMENT  ANESTHESIA/SEDATION: 6.0 Mg IV Versed 200 mcg IV Fentanyl  Total Moderate Sedation Time:  1 hr and 45 min  PROCEDURE: The procedure, risks, benefits, and alternatives were explained to the patient. Questions regarding the procedure were encouraged and answered. The patient understands and consents to the procedure.  The right upper quadrant was evaluated with ultrasound. Gallbladder could not be well-visualized with ultrasound due to stones and bowel gas. As result, the patient was transferred to the CT suite. Patient was placed on the CT scanner.  Images through the abdomen were obtained. The right upper abdomen was prepped and draped in sterile fashion. Maximal barrier sterile technique was utilized including caps, mask, sterile gowns, sterile gloves, sterile drape, hand hygiene and skin antiseptic. The skin was anesthetized with 1% lidocaine. An 18 gauge needle was directed towards the gallbladder multiple times with CT guidance. Needle appeared to be within or near the gallbladder wall on multiple occasions but unable to aspirate any bile or advance wire in the gallbladder. As result, the 18 gauge needle was exchanged for a 21 gauge needle. Again, a 21 gauge needle was directed into the gallbladder from a transhepatic approach. It appeared that the needle was within the gallbladder but it was difficult to advance a wire. At one point, it appeared that a wire was within the gallbladder and an Accustick dilator set was placed. A 10 [place] multipurpose drain was advanced over a J-wire but the tube appeared to be in the pericholecystic space. Unable to aspirate any fluid from this drain. As result, this drain was removed.  The patient was re-prepped and draped. 18 gauge needle was directed towards the gallbladder fundus again with CT guidance. Again, needle would not successfully advance through the wall. Again, a 21 gauge needle was directed towards the gallbladder fundus with CT guidance. The needle appeared to be through the wall but when a wire was advanced, the wire would be in the perihepatic space. At this point, the procedure was stopped.  COMPLICATIONS: None  FINDINGS: Gallbladder appears to be markedly thickening with stones. On multiple occasions, a needle appeared to be within the gallbladder lumen. When the 10 [place] drain was placed, the tube appeared to be outside of the gallbladder lumen and no fluid could be aspirated from this drain. As result, this tube was removed.  IMPRESSION: Unsuccessful percutaneous cholecystostomy tube placement. The  procedure was likely unsuccessful because of the thickened gallbladder and insufficient distention of the gallbladder.   Electronically Signed   By: Markus Daft M.D.   On: 10/08/2013 14:33    Scheduled Meds: . allopurinol  100 mg Oral Daily  . ciprofloxacin  400 mg Intravenous Q24H  . diltiazem  180 mg Oral Daily  . levothyroxine  62.5 mcg Oral QAC breakfast  . pantoprazole  40 mg Oral Q0600  . polyethylene glycol  17 g Oral Daily  . polyvinyl alcohol  1 drop Both Eyes BID  . timolol  1 drop Both Eyes BID  . vancomycin  500 mg Intravenous Q24H  Continuous Infusions: . sodium chloride 75 mL/hr at 10/09/13 1248    Principal Problem:   SIRS (systemic inflammatory response syndrome) Active Problems:   Choledocholithiasis with obstruction   Cellulitis of leg: Bilateral R> L   HYPOTHYROIDISM   ANXIETY   HYPERTENSION   GERD   RENAL INSUFFICIENCY   Aortic valve disorder   Swelling of limb   Choledocholithiasis   Abdominal pain, acute, right upper quadrant   Cholecystitis, acute   Anemia   CKD (chronic kidney disease), stage III    Time spent: [place] MD [facility] Pager [phone]. If 7PM-7AM, please contact night-coverage at [email or web address], password [facility] 10/09/2013, 1:39 PM  LOS: 4 days

## 2013-10-09 NOTE — Progress Notes (Signed)
3 Days Post-Op  Subjective: Pt doing ok; still with RUQ soreness; no N/V; had unsuccessful attempt at perc cholecystostomy 1/16  Objective: Vital signs in last 24 hours: Temp:  [97.4 F (36.3 C)-98.4 F (36.9 C)] 98.1 F (36.7 C) (01/17 0611) Pulse Rate:  [75-107] 75 (01/16 2017) Resp:  [12-20] 18 (01/17 0611) BP: (124-151)/(47-67) 141/47 mmHg (01/17 0611) SpO2:  [94 %-100 %] 94 % (01/17 0611) Weight:  [148 lb 14.4 oz (67.541 kg)] 148 lb 14.4 oz (67.541 kg) (01/17 0500) Last BM Date: 10/07/13  Intake/Output from previous day: 01/16 0701 - 01/17 0700 In: 400 [P.O.:400] Out: -  Intake/Output this shift: Total I/O In: 360 [P.O.:360] Out: -   Puncture site RUQ covered with clean, intact gauze dressing, no apparent bleeding, site mild-mod tender, soft  Lab Results:   Recent Labs  10/07/13 0749 10/08/13 0430  WBC 10.1 15.2*  HGB 9.2* 8.1*  HCT 28.2* 24.6*  PLT 213 172   BMET  Recent Labs  10/07/13 0749 10/08/13 0430  NA 138 138  K 4.2 3.9  CL 100 103  CO2 28 24  GLUCOSE 100* 81  BUN 31* 33*  CREATININE 1.53* 1.40*  CALCIUM 8.4 8.1*   PT/INR No results found for this basename: LABPROT, INR,  in the last 72 hours ABG No results found for this basename: PHART, PCO2, PO2, HCO3,  in the last 72 hours  Studies/Results: Nm Hepatobiliary  10/07/2013   CLINICAL DATA:  Right upper quadrant. ERCP with sphincterotomy same day  EXAM: NUCLEAR MEDICINE HEPATOBILIARY IMAGING  TECHNIQUE: Sequential images of the abdomen were obtained out to 60 minutes following intravenous administration of radiopharmaceutical.  COMPARISON:  DG ERCP WO/W SPHINCTEROTOMY dated 10/06/2013; US ABDOMEN COMPLETE dated 10/06/2013  RADIOPHARMACEUTICALS:  5.0 mCi Tc-99m Choletec  FINDINGS: Homogeneous uptake of radiotracer within the liver. Bile is excreted into the the common bile duct and flows into the small bowel by 15 min. Examination was continued to 120 min. There was no filling of the gallbladder  over 120 min time. Patient has a morphine allergy.  IMPRESSION: 1. No filling of the gallbladder suggests obstruction of the cystic duct / acute cholecystitis.  2.  Patent common bile duct.  Findings conveyed toDr.  Marcello Moores On 10/07/2013  TD32:20.   Electronically Signed   By: Suzy Bouchard M.D.   On: 10/07/2013 18:02   Dg Chest Port 1 View  10/07/2013   CLINICAL DATA:  Fever, cough and chills  EXAM: PORTABLE CHEST - 1 VIEW  COMPARISON:  Prior chest x-ray 10/06/2013  FINDINGS: Stable mild cardiomegaly. Inspiratory volumes are lower. This results in crowding of the pulmonary vasculature and slight Lee increased prominence of the interstitial markings. Stable atherosclerotic and tortuous thoracic aorta. No new focal airspace consolidation. No pleural effusion or pneumothorax.  IMPRESSION: Lower inspiratory volumes results in a crowding of the pulmonary vasculature.  No new focal airspace consolidation to suggest pneumonia.   Electronically Signed   By: Jacqulynn Cadet M.D.   On: 10/07/2013 09:58   Ct Image Guided Drainage By Percutaneous Catheter  10/08/2013   CLINICAL DATA:  80 year old with bile duct stones and concern for cholecystitis. Request for a percutaneous cholecystostomy tube placement.  EXAM: ATTEMPTED CT-GUIDED CHOLECYSTOSTOMY TUBE PLACEMENT  ANESTHESIA/SEDATION: 6.0 Mg IV Versed 200 mcg IV Fentanyl  Total Moderate Sedation Time:  1 hr and 45 min  PROCEDURE: The procedure, risks, benefits, and alternatives were explained to the patient. Questions regarding the procedure were encouraged and answered. The patient  understands and consents to the procedure.  The right upper quadrant was evaluated with ultrasound. Gallbladder could not be well-visualized with ultrasound due to stones and bowel gas. As result, the patient was transferred to the CT suite. Patient was placed on the CT scanner. Images through the abdomen were obtained. The right upper abdomen was prepped and draped in sterile fashion.  Maximal barrier sterile technique was utilized including caps, mask, sterile gowns, sterile gloves, sterile drape, hand hygiene and skin antiseptic. The skin was anesthetized with 1% lidocaine. An 18 gauge needle was directed towards the gallbladder multiple times with CT guidance. Needle appeared to be within or near the gallbladder wall on multiple occasions but unable to aspirate any bile or advance wire in the gallbladder. As result, the 18 gauge needle was exchanged for a 21 gauge needle. Again, a 21 gauge needle was directed into the gallbladder from a transhepatic approach. It appeared that the needle was within the gallbladder but it was difficult to advance a wire. At one point, it appeared that a wire was within the gallbladder and an Accustick dilator set was placed. A 10 [place] multipurpose drain was advanced over a J-wire but the tube appeared to be in the pericholecystic space. Unable to aspirate any fluid from this drain. As result, this drain was removed.  The patient was re-prepped and draped. 18 gauge needle was directed towards the gallbladder fundus again with CT guidance. Again, needle would not successfully advance through the wall. Again, a 21 gauge needle was directed towards the gallbladder fundus with CT guidance. The needle appeared to be through the wall but when a wire was advanced, the wire would be in the perihepatic space. At this point, the procedure was stopped.  COMPLICATIONS: None  FINDINGS: Gallbladder appears to be markedly thickening with stones. On multiple occasions, a needle appeared to be within the gallbladder lumen. When the 10 [place] drain was placed, the tube appeared to be outside of the gallbladder lumen and no fluid could be aspirated from this drain. As result, this tube was removed.  IMPRESSION: Unsuccessful percutaneous cholecystostomy tube placement. The procedure was likely unsuccessful because of the thickened gallbladder and insufficient distention of the  gallbladder.   Electronically Signed   By: Markus Daft M.D.   On: 10/08/2013 14:33    Anti-infectives: Anti-infectives   Start     Dose/Rate Route Frequency Ordered Stop   10/07/13 1200  vancomycin (VANCOCIN) 500 mg in sodium chloride 0.9 % 100 mL IVPB     500 mg 100 mL/hr over 60 Minutes Intravenous Every 24 hours 10/06/13 1002     10/07/13 0600  ciprofloxacin (CIPRO) IVPB 400 mg     400 mg 200 mL/hr over 60 Minutes Intravenous Every 24 hours 10/06/13 0337     10/06/13 1015  vancomycin (VANCOCIN) IVPB 1000 mg/200 mL premix     1,000 mg 200 mL/hr over 60 Minutes Intravenous  Once 10/06/13 1002 10/06/13 1222   10/06/13 0230  ciprofloxacin (CIPRO) IVPB 400 mg  Status:  Discontinued     400 mg 200 mL/hr over 60 Minutes Intravenous Every 12 hours 10/06/13 0216 10/06/13 8366      Assessment/Plan: s/p unsuccessful attempt at perc cholecystostomy 1/16 (see above rad note for further details); plans as per CCS   LOS: 4 days    Aide Wojnar,D [facility] 10/09/2013

## 2013-10-09 NOTE — Progress Notes (Signed)
PT Cancellation Note  Patient Details Name: [name] MRN: [id number] DOB: [birth year]   Cancelled Treatment:    Reason Eval/Treat Not Completed: Patient not medically ready. Pt placed on bedrest after PT Consult ordered. Have notified MD via text page and await clarification if OK to proceed with activity/eval.   Kyrin Gratz 10/09/2013, 9:51 AM Pager [phone]

## 2013-10-10 DIAGNOSIS — I87309 Chronic venous hypertension (idiopathic) without complications of unspecified lower extremity: Secondary | ICD-10-CM

## 2013-10-10 DIAGNOSIS — K810 Acute cholecystitis: Secondary | ICD-10-CM

## 2013-10-10 DIAGNOSIS — Z228 Carrier of other infectious diseases: Secondary | ICD-10-CM

## 2013-10-10 LAB — BASIC METABOLIC PANEL
BUN: 20 mg/dL (ref 6–23)
CHLORIDE: 103 meq/L (ref 96–112)
CO2: 24 meq/L (ref 19–32)
Calcium: 8.3 mg/dL — ABNORMAL LOW (ref 8.4–10.5)
Creatinine, Ser: 1.1 mg/dL (ref 0.50–1.10)
GFR calc Af Amer: 48 mL/min — ABNORMAL LOW (ref >90)
GFR calc non Af Amer: 41 mL/min — ABNORMAL LOW (ref >90)
GLUCOSE: 112 mg/dL — AB (ref 70–99)
POTASSIUM: 3.8 meq/L (ref 3.7–5.3)
SODIUM: 139 meq/L (ref 137–147)

## 2013-10-10 LAB — CBC
HCT: 23.3 % — ABNORMAL LOW (ref 36.0–46.0)
HEMOGLOBIN: 7.5 g/dL — AB (ref 12.0–15.0)
MCH: 31.3 pg (ref 26.0–34.0)
MCHC: 32.2 g/dL (ref 30.0–36.0)
MCV: 97.1 fL (ref 78.0–100.0)
Platelets: 173 10*3/uL (ref 150–400)
RBC: 2.4 MIL/uL — AB (ref 3.87–5.11)
RDW: 17.2 % — ABNORMAL HIGH (ref 11.5–15.5)
WBC: 6.3 10*3/uL (ref 4.0–10.5)

## 2013-10-10 LAB — HEPATIC FUNCTION PANEL
ALK PHOS: 144 U/L — AB (ref 39–117)
ALT: 86 U/L — AB (ref 0–35)
AST: 24 U/L (ref 0–37)
Albumin: 2.3 g/dL — ABNORMAL LOW (ref 3.5–5.2)
BILIRUBIN INDIRECT: 0.7 mg/dL (ref 0.3–0.9)
Bilirubin, Direct: 0.6 mg/dL — ABNORMAL HIGH (ref 0.0–0.3)
Total Bilirubin: 1.3 mg/dL — ABNORMAL HIGH (ref 0.3–1.2)
Total Protein: 5.1 g/dL — ABNORMAL LOW (ref 6.0–8.3)

## 2013-10-10 MED ORDER — TRIAMCINOLONE 0.1 % CREAM:EUCERIN CREAM 1:1
TOPICAL_CREAM | Freq: Three times a day (TID) | CUTANEOUS | Status: DC
  Administered 2013-10-10 – 2013-10-11 (×2): via TOPICAL
  Filled 2013-10-10: qty 1

## 2013-10-10 MED ORDER — CIPROFLOXACIN HCL 250 MG PO TABS: 250.0000 mg | ORAL_TABLET | Freq: Two times a day (BID) | ORAL | Status: DC

## 2013-10-10 MED ORDER — FUROSEMIDE 20 MG PO TABS
20.0000 mg | ORAL_TABLET | Freq: Every day | ORAL | Status: DC
  Administered 2013-10-10 – 2013-10-11 (×2): 20 mg via ORAL
  Filled 2013-10-10 (×2): qty 1

## 2013-10-10 MED ORDER — CIPROFLOXACIN HCL 500 MG PO TABS
500.0000 mg | ORAL_TABLET | ORAL | Status: DC
  Administered 2013-10-10: 500 mg via ORAL
  Filled 2013-10-10 (×2): qty 1

## 2013-10-10 NOTE — Progress Notes (Signed)
Patient ID: [name], female   DOB: [birth year], 80 y.o.   MRN: [id number]   LOS: 5 days   Subjective: Says she feels miserable but doesn't look it. Continued pain, denies nausea. Tolerated regular diet last night.   Objective: Vital signs in last 24 hours: Temp:  [97.5 F (36.4 C)-98.8 F (37.1 C)] 97.5 F (36.4 C) (01/18 0558) Pulse Rate:  [84-89] 89 (01/18 0558) Resp:  [18] 18 (01/18 0558) BP: (137-142)/(55-73) 137/72 mmHg (01/18 0558) SpO2:  [92 %-100 %] 92 % (01/18 0558) Last BM Date: 10/07/13   Laboratory  CBC  Recent Labs  10/09/13 0906 10/10/13 0715  WBC 8.0 6.3  HGB 8.7* 7.5*  HCT 27.3* 23.3*  PLT 208 173   BMET  Recent Labs  10/09/13 0906 10/10/13 0715  NA 140 139  K 3.9 3.8  CL 104 103  CO2 24 24  GLUCOSE 111* 112*  BUN 26* 20  CREATININE 1.11* 1.10  CALCIUM 8.5 8.3*    Physical Exam General appearance: alert and no distress Resp: clear to auscultation bilaterally Cardio: regular rate and rhythm GI: Soft, +BS, TTP right   Assessment/Plan: Acute cholecystitis -- Plan home on abx with delayed cholecystitis Choledocholithiasis, s/p ERCP  [facility] for discharge from surgical standpoint.    Lisette Abu, PA-C Pager: [phone] 10/10/2013

## 2013-10-10 NOTE — Progress Notes (Signed)
[facility] PROGRESS NOTE  [name] PJK:[id number] DOB: [birth year] DOA: 10/05/2013 PCP: Noralee Space, MD  Assessment/Plan: #1 systemic inflammatory response Patient noted to be tachycardic with a fever yesterday. Fever has improved. Tachycardia has improved. Abdominal ultrasound which was done did show distended gallbladder containing multiple gallstones, positive sonographic Murphy sign consistent with acute cholecystitis, obstructive 0.8 cm gallstone in the distal, mild duct. HIDA scan consistent with acute cholecystitis. Patient also noted to have bilateral lower extremity cellulitis. Patient is status post ERCP with sphincterectomy. Chest x-ray neg. Blood cultures x2 pending. UA  Neg. Cultures and sensitivities pending. Lactic acid level neg.  Pro calcitonin level elevated at 6.59. WBC trending down. Continue empiric IV vancomycin and IV ciprofloxacin. Patient with unsuccessful placement of cholecystotomy drain. CCS and GI ff. ID consult for antibiotic coverage and duration.  #2 gallstones with probable acute cholecystitis and choledocholithiasis status post ERCP with sphincterectomy and stone extraction 10/06/2013 Patient is status post ERCP with sphincterectomy and stone extraction. Abdominal ultrasound which was done on admission was consistent with acute cholecystitis and multiple gallstones and nonobstructing the stone in the common bile duct. Patient noted to have fevers and is tachycardic. LFTs slowly trending down. Continue empiric IV ciprofloxacin. Patient has been seen by general surgeon HIDA scan was done and consistent with acute cholecystitis. Patient with unsuccessful  placement of cholecystostomy drain for acute cholecystitis. Continue IV antibiotics. General surgery and GI following and appreciate input and recommendations. Will consult with ID for antibiotic coverage and duration as per CCS as patient improving will likely treat with antibiotics.  #3 bilateral lower  extremity cellulitis Clinical improvement. Continue empiric IV vancomycin.  Keep lower extremities elevated.  #4 chronic kidney disease stage III Stable. NSL IVF. Follow.  #5 hypertension Continue Cardizem.  #6 hypothyroidism Continue Synthroid.  #7 gastroesophageal reflux disease Placed on a PPI.  #8 prophylaxis PPI for GI prophylaxis. SCDs for DVT prophylaxis.  Code Status: Full Family Communication: Updated patient at bedside. Disposition Plan: Home when medically stable   Consultants:  Gastroenterology: Dr. Fuller Plan 10/06/2013  General surgery: Dr. Georganna Skeans 10/06/2013  Cardiology: Dr. Gwenlyn Found 10/06/2013  Procedures:  Abdominal ultrasound 10/06/2013  Chest x-ray 10/06/2013  ERCP with sphincterectomy/papillotomy and ERCP with removal of calculi 10/06/2013 per Dr. Fuller Plan  HIDA scan 10/07/2013  Unsuccesful perc cholesytostomy 10/08/13  Antibiotics:  IV vancomycin 10/06/2013  IV ciprofloxacin 10/06/2013  HPI/Subjective: Patient denies any abdominal pain. Patient states feeling better than yesterday. No nausea. No vomiting. Patient c/o abdominal discomfort near incision site for unsuccessful per cholecystectomy.  Objective: Filed Vitals:   10/10/13 0558  BP: 137/72  Pulse: 89  Temp: 97.5 F (36.4 C)  Resp: 18    Intake/Output Summary (Last 24 hours) at 10/10/13 0952 Last data filed at 10/09/13 1700  Gross per 24 hour  Intake    241 ml  Output      0 ml  Net    241 ml   Filed Weights   10/06/13 0343 10/08/13 0500 10/09/13 0500  Weight: 62.9 kg (138 lb 10.7 oz) 67.223 kg (148 lb 3.2 oz) 67.541 kg (148 lb 14.4 oz)    Exam:   General:  NAD  Cardiovascular: RRR  Respiratory: CTAB anterior lung fields  Abdomen: Soft/NT/ND/+BS  Musculoskeletal: BLE  l > r with 2 + edema and decreased erythema and warmth  Data Reviewed: Basic Metabolic Panel:  Recent Labs Lab 10/06/13 0609 10/07/13 0749 10/08/13 0430 10/09/13 0906 10/10/13 0715  NA  138 138 138 140  139  K 4.2 4.2 3.9 3.9 3.8  CL 98 100 103 104 103  CO2 27 28 24 24 24   GLUCOSE 113* 100* 81 111* 112*  BUN 33* 31* 33* 26* 20  CREATININE 1.41* 1.53* 1.40* 1.11* 1.10  CALCIUM 8.7 8.4 8.1* 8.5 8.3*   Liver Function Tests:  Recent Labs Lab 10/06/13 0019 10/06/13 0609 10/07/13 0749 10/08/13 0430 10/09/13 0906  AST 167* 481* 390* 138* 54*  ALT 86* 228* 318* 191* 137*  ALKPHOS 129* 171* 209* 175* 171*  BILITOT 2.2* 6.0* 4.4* 2.6* 1.8*  PROT 7.1 6.0 5.7* 4.9* 5.6*  ALBUMIN 3.6 2.9* 2.6* 2.2* 2.5*    Recent Labs Lab 10/06/13 0019  LIPASE 22   No results found for this basename: AMMONIA,  in the last 168 hours CBC:  Recent Labs Lab 10/06/13 0019 10/06/13 0609 10/07/13 0749 10/08/13 0430 10/09/13 0906 10/10/13 0715  WBC 9.7 10.8* 10.1 15.2* 8.0 6.3  NEUTROABS 7.1  --  7.7 12.4* 5.7  --   HGB 10.1* 9.9* 9.2* 8.1* 8.7* 7.5*  HCT 31.2* 30.0* 28.2* 24.6* 27.3* 23.3*  MCV 95.4 93.5 97.6 95.7 98.2 97.1  PLT 190 152 213 172 208 173   Cardiac Enzymes:  Recent Labs Lab 10/06/13 0019  TROPONINI <0.30   BNP (last 3 results)  Recent Labs  10/06/13 0019  PROBNP 329.3   CBG: No results found for this basename: GLUCAP,  in the last 168 hours  Recent Results (from the past 240 hour(s))  CULTURE, BLOOD (ROUTINE X 2)     Status: None   Collection Time    10/07/13 11:08 AM      Result Value Range Status   Specimen Description BLOOD RIGHT HAND   Final   Special Requests BOTTLES DRAWN AEROBIC ONLY [facility]   Final   Culture  Setup Time     Final   Value: 10/07/2013 16:26     Performed at [facility]   Culture     Final   Value:        BLOOD CULTURE RECEIVED NO GROWTH TO DATE CULTURE WILL BE HELD FOR 5 DAYS BEFORE ISSUING A FINAL NEGATIVE REPORT     Performed at [facility]   Report Status PENDING   Incomplete  URINE CULTURE     Status: None   Collection Time    10/07/13 12:16 PM      Result Value Range Status   Specimen Description  URINE, CLEAN CATCH   Final   Special Requests NONE   Final   Culture  Setup Time     Final   Value: 10/07/2013 16:47     Performed at [facility]     Final   Value: 45,000 COLONIES/ML     Performed at [facility]   Culture     Final   Value: [facility] MORGANII     Performed at [facility]   Report Status 10/09/2013 FINAL   Final   Organism ID, Bacteria MORGANELLA MORGANII   Final     Studies: Ct Image Guided Drainage By Percutaneous Catheter  10/08/2013   CLINICAL DATA:  80 year old with bile duct stones and concern for cholecystitis. Request for a percutaneous cholecystostomy tube placement.  EXAM: ATTEMPTED CT-GUIDED CHOLECYSTOSTOMY TUBE PLACEMENT  ANESTHESIA/SEDATION: 6.0 Mg IV Versed 200 mcg IV Fentanyl  Total Moderate Sedation Time:  1 hr and 45 min  PROCEDURE: The procedure, risks, benefits, and alternatives were explained  to the patient. Questions regarding the procedure were encouraged and answered. The patient understands and consents to the procedure.  The right upper quadrant was evaluated with ultrasound. Gallbladder could not be well-visualized with ultrasound due to stones and bowel gas. As result, the patient was transferred to the CT suite. Patient was placed on the CT scanner. Images through the abdomen were obtained. The right upper abdomen was prepped and draped in sterile fashion. Maximal barrier sterile technique was utilized including caps, mask, sterile gowns, sterile gloves, sterile drape, hand hygiene and skin antiseptic. The skin was anesthetized with 1% lidocaine. An 18 gauge needle was directed towards the gallbladder multiple times with CT guidance. Needle appeared to be within or near the gallbladder wall on multiple occasions but unable to aspirate any bile or advance wire in the gallbladder. As result, the 18 gauge needle was exchanged for a 21 gauge needle. Again, a 21 gauge needle was directed into the gallbladder from a  transhepatic approach. It appeared that the needle was within the gallbladder but it was difficult to advance a wire. At one point, it appeared that a wire was within the gallbladder and an Accustick dilator set was placed. A 10 [place] multipurpose drain was advanced over a J-wire but the tube appeared to be in the pericholecystic space. Unable to aspirate any fluid from this drain. As result, this drain was removed.  The patient was re-prepped and draped. 18 gauge needle was directed towards the gallbladder fundus again with CT guidance. Again, needle would not successfully advance through the wall. Again, a 21 gauge needle was directed towards the gallbladder fundus with CT guidance. The needle appeared to be through the wall but when a wire was advanced, the wire would be in the perihepatic space. At this point, the procedure was stopped.  COMPLICATIONS: None  FINDINGS: Gallbladder appears to be markedly thickening with stones. On multiple occasions, a needle appeared to be within the gallbladder lumen. When the 10 [place] drain was placed, the tube appeared to be outside of the gallbladder lumen and no fluid could be aspirated from this drain. As result, this tube was removed.  IMPRESSION: Unsuccessful percutaneous cholecystostomy tube placement. The procedure was likely unsuccessful because of the thickened gallbladder and insufficient distention of the gallbladder.   Electronically Signed   By: Markus Daft M.D.   On: 10/08/2013 14:33    Scheduled Meds: . allopurinol  100 mg Oral Daily  . ciprofloxacin  400 mg Intravenous Q24H  . diltiazem  180 mg Oral Daily  . levothyroxine  62.5 mcg Oral QAC breakfast  . pantoprazole  40 mg Oral Q0600  . polyethylene glycol  17 g Oral Daily  . polyvinyl alcohol  1 drop Both Eyes BID  . timolol  1 drop Both Eyes BID  . vancomycin  500 mg Intravenous Q24H   Continuous Infusions: . sodium chloride 20 mL (10/09/13 1414)    Principal Problem:   SIRS (systemic  inflammatory response syndrome) Active Problems:   Choledocholithiasis with obstruction   Cellulitis of leg: Bilateral R> L   HYPOTHYROIDISM   ANXIETY   HYPERTENSION   GERD   RENAL INSUFFICIENCY   Aortic valve disorder   Swelling of limb   Choledocholithiasis   Abdominal pain, acute, right upper quadrant   Cholecystitis, acute   Anemia   CKD (chronic kidney disease), stage III    Time spent: [place] MD [facility] Pager [phone]. If 7PM-7AM, please contact night-coverage  at [email or web address], password [facility] 10/10/2013, 9:52 AM  LOS: 5 days

## 2013-10-10 NOTE — Consult Note (Signed)
[facility]    Date of Admission:  10/05/2013  Date of Consult:  10/10/2013  Reason for Consult: acute cholecystitis, cellulitis and asymptomatic bacteriuria Referring Physician: Dr. Grandville Silos   HPI: [name] is an 80 y.o. female. With right upper quadrant abdominal pain on 10/06/2013. Ultrasound showed distended gallbladder with stones and HIDA scan was consistent with cholecystitis acutely. She was evaluated by general surgery and also by radiology. She underwent ERCP. Their attempts to place a percutaneous drain but these were unsuccessful. She's been managed therefore medically with ciprofloxacin and vancomycin. While on antibiotics her abdominal pain and overall clinical picture has improved dramatically.  She is also had chronic venous stasis changes and had recent bout of apparent bilateral cellulitis that was evaluated by Dr. Wilhemina Bonito in [facility] dermatology. She states that she was having problems about a month ago when she had severe swelling and pain in her ankles along with fevers and other symptoms. She was given antibiotic and to give me the dosage but could not recall the name. She states that her legs are improved dramatically since then especially since she had been in hospital and been elevating her legs.  In the interim her blood cultures have come back negative so far she hasn't urine culture with a Morganella species at 45,000 colony-forming units per unit per milliliter the zachs resistant to the ciprofloxacin that she has been receiving.     Past Medical History  Diagnosis Date  . Hypertension   . Hypothyroidism   . Glaucoma   . Acute bronchitis   . Palpitations   . Unspecified venous (peripheral) insufficiency   . Pure hypercholesterolemia   . GERD (gastroesophageal reflux disease)   . Diverticulosis of colon (without mention of hemorrhage)   . Benign neoplasm of colon   . Renal insufficiency   . DJD (degenerative  joint disease)   . Vitamin D deficiency disease   . Anxiety state, unspecified   . CKD (chronic kidney disease), stage III 10/06/2013    Past Surgical History  Procedure Laterality Date  . Cataract extraction    . Abdominal hysterectomy    . Breast biopsy      Benign  . Total knee arthroplasty  1999    right  . Total knee arthroplasty  11/2008    left - Dr Lorin Mercy  . Toe amputation  08/2009    Right second toe - Dr Beola Cord  . Ercp N/A 10/06/2013    Procedure: ENDOSCOPIC RETROGRADE CHOLANGIOPANCREATOGRAPHY (ERCP);  Surgeon: Ladene Artist, MD;  Location: [facility];  Service: Endoscopy;  Laterality: N/A;  ergies:   Allergies  Allergen Reactions  . Enablex [Darifenacin Hydrobromide Er]     Caused her throat and mouth to have bumps and feel like it was swelling  . Clarithromycin Swelling    REACTION: causes her mouth to swell  . Codeine Other (See Comments)    Makes pt nervous  . Morphine Nausea And Vomiting    REACTION: n/v  . Sulfonamide Derivatives Other (See Comments)    REACTION: unsure of reaction     Medications: I have reviewed patients current medications as documented in Epic Anti-infectives   Start     Dose/Rate Route Frequency Ordered Stop   10/10/13 1345  ciprofloxacin (CIPRO) tablet 250 mg     250 mg Oral 2 times daily 10/10/13 1340     10/07/13 1200  vancomycin (VANCOCIN) 500 mg in sodium chloride 0.9 % 100 mL IVPB  Status:  Discontinued     500 mg 100 mL/hr over 60 Minutes Intravenous Every 24 hours 10/06/13 1002 10/10/13 1338   10/07/13 0600  ciprofloxacin (CIPRO) IVPB 400 mg  Status:  Discontinued     400 mg 200 mL/hr over 60 Minutes Intravenous Every 24 hours 10/06/13 0337 10/10/13 1340   10/06/13 1015  vancomycin (VANCOCIN) IVPB 1000 mg/200 mL premix     1,000 mg 200 mL/hr over 60 Minutes Intravenous  Once 10/06/13 1002 10/06/13 1222   10/06/13 0230  ciprofloxacin (CIPRO) IVPB 400 mg  Status:  Discontinued     400 mg 200 mL/hr over 60 Minutes Intravenous  Every 12 hours 10/06/13 0216 10/06/13 2956      Social History:  reports that she has never smoked. She has never used smokeless tobacco. She reports that she does not drink alcohol or use illicit drugs.  Family History  Problem Relation Age of Onset  . Heart disease Mother   . Cancer Father     Throat  . Heart disease Father     As in HPI and primary teams notes otherwise 12 point review of systems is negative  Blood pressure 137/72, pulse 89, temperature 97.5 F (36.4 C), temperature source Oral, resp. rate 18, height $RemoveBe'5\' 3"'cfqQJyway$  (1.6 m), weight 148 lb 14.4 oz (67.541 kg), SpO2 92.00%. General: Alert and awake, oriented x3, not in any acute distress. HEENT: anicteric sclera, pupils reactive to light and accommodation, EOMI, oropharynx clear and without exudate CVS regular rate, normal r,  no murmur rubs or gallops Chest: clear to auscultation bilaterally, no wheezing, rales or rhonchi Abdomen minimal right upper quadrant tenderness nondistended, normal bowel sounds, Extremities: She has some chronic venostasis changes along with some erythema see picture below      Neuro: nonfocal, strength and sensation intact   Results for orders placed during the hospital encounter of 10/05/13 (from the past 48 hour(s))  PROCALCITONIN     Status: None   Collection Time    10/09/13  4:27 AM      Result Value Range   Procalcitonin 55.24     Comment:            Interpretation:     PCT >= 10 ng/mL:     Important systemic inflammatory response,     almost exclusively due to severe bacterial     sepsis or septic shock.     (NOTE)             ICU PCT Algorithm               Non ICU PCT Algorithm        ----------------------------     ------------------------------             PCT < 0.25 ng/mL                 PCT < 0.1 ng/mL         Stopping of antibiotics            Stopping of antibiotics           strongly encouraged.               strongly encouraged.        ----------------------------      ------------------------------           PCT level decrease by               PCT < 0.25 ng/mL           >=  80% from peak PCT           OR PCT 0.25 - 0.5 ng/mL          Stopping of antibiotics                                                 encouraged.         Stopping of antibiotics               encouraged.        ----------------------------     ------------------------------           PCT level decrease by              PCT >= 0.25 ng/mL           < 80% from peak PCT            AND PCT >= 0.5 ng/mL            Continuing antibiotics                                                  encouraged.           Continuing antibiotics                encouraged.        ----------------------------     ------------------------------         PCT level increase compared          PCT > 0.5 ng/mL             with peak PCT AND              PCT >= 0.5 ng/mL             Escalation of antibiotics                                              strongly encouraged.          Escalation of antibiotics            strongly encouraged.  COMPREHENSIVE METABOLIC PANEL     Status: Abnormal   Collection Time    10/09/13  9:06 AM      Result Value Range   Sodium 140  137 - 147 mEq/L   Potassium 3.9  3.7 - 5.3 mEq/L   Chloride 104  96 - 112 mEq/L   CO2 24  19 - 32 mEq/L   Glucose, Bld 111 (*) 70 - 99 mg/dL   BUN 26 (*) 6 - 23 mg/dL   Creatinine, Ser 1.11 (*) 0.50 - 1.10 mg/dL   Calcium 8.5  8.4 - 10.5 mg/dL   Total Protein 5.6 (*) 6.0 - 8.3 g/dL   Albumin 2.5 (*) 3.5 - 5.2 g/dL   AST 54 (*) 0 - 37 U/L   ALT 137 (*) 0 - 35 U/L   Alkaline Phosphatase 171 (*) 39 - 117 U/L   Total Bilirubin 1.8 (*) 0.3 - 1.2 mg/dL   GFR calc non Af Amer 41 (*) >  90 mL/min   GFR calc Af Amer 47 (*) >90 mL/min   Comment: (NOTE)     The eGFR has been calculated using the CKD EPI equation.     This calculation has not been validated in all clinical situations.     eGFR's persistently <90 mL/min signify possible Chronic Kidney      Disease.  CBC WITH DIFFERENTIAL     Status: Abnormal   Collection Time    10/09/13  9:06 AM      Result Value Range   WBC 8.0  4.0 - 10.5 K/uL   RBC 2.78 (*) 3.87 - 5.11 MIL/uL   Hemoglobin 8.7 (*) 12.0 - 15.0 g/dL   HCT 27.3 (*) 36.0 - 46.0 %   MCV 98.2  78.0 - 100.0 fL   MCH 31.3  26.0 - 34.0 pg   MCHC 31.9  30.0 - 36.0 g/dL   RDW 17.3 (*) 11.5 - 15.5 %   Platelets 208  150 - 400 K/uL   Neutrophils Relative % 71  43 - 77 %   Neutro Abs 5.7  1.7 - 7.7 K/uL   Lymphocytes Relative 18  12 - 46 %   Lymphs Abs 1.4  0.7 - 4.0 K/uL   Monocytes Relative 9  3 - 12 %   Monocytes Absolute 0.7  0.1 - 1.0 K/uL   Eosinophils Relative 2  0 - 5 %   Eosinophils Absolute 0.2  0.0 - 0.7 K/uL   Basophils Relative 0  0 - 1 %   Basophils Absolute 0.0  0.0 - 0.1 K/uL  BASIC METABOLIC PANEL     Status: Abnormal   Collection Time    10/10/13  7:15 AM      Result Value Range   Sodium 139  137 - 147 mEq/L   Potassium 3.8  3.7 - 5.3 mEq/L   Chloride 103  96 - 112 mEq/L   CO2 24  19 - 32 mEq/L   Glucose, Bld 112 (*) 70 - 99 mg/dL   BUN 20  6 - 23 mg/dL   Creatinine, Ser 1.10  0.50 - 1.10 mg/dL   Calcium 8.3 (*) 8.4 - 10.5 mg/dL   GFR calc non Af Amer 41 (*) >90 mL/min   GFR calc Af Amer 48 (*) >90 mL/min   Comment: (NOTE)     The eGFR has been calculated using the CKD EPI equation.     This calculation has not been validated in all clinical situations.     eGFR's persistently <90 mL/min signify possible Chronic Kidney     Disease.  CBC     Status: Abnormal   Collection Time    10/10/13  7:15 AM      Result Value Range   WBC 6.3  4.0 - 10.5 K/uL   RBC 2.40 (*) 3.87 - 5.11 MIL/uL   Hemoglobin 7.5 (*) 12.0 - 15.0 g/dL   HCT 23.3 (*) 36.0 - 46.0 %   MCV 97.1  78.0 - 100.0 fL   MCH 31.3  26.0 - 34.0 pg   MCHC 32.2  30.0 - 36.0 g/dL   RDW 17.2 (*) 11.5 - 15.5 %   Platelets 173  150 - 400 K/uL      Component Value Date/Time   SDES URINE, CLEAN CATCH 10/07/2013 1216   SPECREQUEST NONE  10/07/2013 1216   CULT  Value: MORGANELLA MORGANII Performed at [facility] 10/07/2013 1216   REPTSTATUS 10/09/2013 FINAL 10/07/2013 1216  Ct Image Guided Drainage By Percutaneous Catheter  10/08/2013   CLINICAL DATA:  80 year old with bile duct stones and concern for cholecystitis. Request for a percutaneous cholecystostomy tube placement.  EXAM: ATTEMPTED CT-GUIDED CHOLECYSTOSTOMY TUBE PLACEMENT  ANESTHESIA/SEDATION: 6.0 Mg IV Versed 200 mcg IV Fentanyl  Total Moderate Sedation Time:  1 hr and 45 min  PROCEDURE: The procedure, risks, benefits, and alternatives were explained to the patient. Questions regarding the procedure were encouraged and answered. The patient understands and consents to the procedure.  The right upper quadrant was evaluated with ultrasound. Gallbladder could not be well-visualized with ultrasound due to stones and bowel gas. As result, the patient was transferred to the CT suite. Patient was placed on the CT scanner. Images through the abdomen were obtained. The right upper abdomen was prepped and draped in sterile fashion. Maximal barrier sterile technique was utilized including caps, mask, sterile gowns, sterile gloves, sterile drape, hand hygiene and skin antiseptic. The skin was anesthetized with 1% lidocaine. An 18 gauge needle was directed towards the gallbladder multiple times with CT guidance. Needle appeared to be within or near the gallbladder wall on multiple occasions but unable to aspirate any bile or advance wire in the gallbladder. As result, the 18 gauge needle was exchanged for a 21 gauge needle. Again, a 21 gauge needle was directed into the gallbladder from a transhepatic approach. It appeared that the needle was within the gallbladder but it was difficult to advance a wire. At one point, it appeared that a wire was within the gallbladder and an Accustick dilator set was placed. A 10 [place] multipurpose drain was advanced over a J-wire but the tube appeared to  be in the pericholecystic space. Unable to aspirate any fluid from this drain. As result, this drain was removed.  The patient was re-prepped and draped. 18 gauge needle was directed towards the gallbladder fundus again with CT guidance. Again, needle would not successfully advance through the wall. Again, a 21 gauge needle was directed towards the gallbladder fundus with CT guidance. The needle appeared to be through the wall but when a wire was advanced, the wire would be in the perihepatic space. At this point, the procedure was stopped.  COMPLICATIONS: None  FINDINGS: Gallbladder appears to be markedly thickening with stones. On multiple occasions, a needle appeared to be within the gallbladder lumen. When the 10 [place] drain was placed, the tube appeared to be outside of the gallbladder lumen and no fluid could be aspirated from this drain. As result, this tube was removed.  IMPRESSION: Unsuccessful percutaneous cholecystostomy tube placement. The procedure was likely unsuccessful because of the thickened gallbladder and insufficient distention of the gallbladder.   Electronically Signed   By: Markus Daft M.D.   On: 10/08/2013 14:33     Recent Results (from the past 720 hour(s))  CULTURE, BLOOD (ROUTINE X 2)     Status: None   Collection Time    10/07/13 11:08 AM      Result Value Range Status   Specimen Description BLOOD RIGHT HAND   Final   Special Requests BOTTLES DRAWN AEROBIC ONLY [facility]   Final   Culture  Setup Time     Final   Value: 10/07/2013 16:26     Performed at [facility]   Culture     Final   Value:        BLOOD CULTURE RECEIVED NO GROWTH TO DATE CULTURE WILL BE HELD FOR 5 DAYS BEFORE ISSUING A FINAL  NEGATIVE REPORT     Performed at [facility]   Report Status PENDING   Incomplete  URINE CULTURE     Status: None   Collection Time    10/07/13 12:16 PM      Result Value Range Status   Specimen Description URINE, CLEAN CATCH   Final   Special Requests NONE    Final   Culture  Setup Time     Final   Value: 10/07/2013 16:47     Performed at [place]     Final   Value: 45,000 COLONIES/ML     Performed at [facility]   Culture     Final   Value: [facility] MORGANII     Performed at [facility]   Report Status 10/09/2013 FINAL   Final   Organism ID, Bacteria MORGANELLA MORGANII   Final     Impression/Recommendation  80 year old with acute cholecystitis, also who was being treated for cellulitis as an outpatient and who has Morganella 45K in urine R to current abx  #1 Acute cholecystitis: She appears to have done quite well on her current regimen. I am changing her over to oral ciprofloxacin dose589m once daily.(thanks to Pharmacy for correct dose) She can complete a total of 10-14 days with this antibiotic which would be another 3-7 days to cover her acute cholecystitis. This is actually a more protracted course with a typical recommended course of the proximal 7 days.  #2 lateral lower extremity edema venous stasis and possible cellulitis: I would see how she does on ciprofloxacin alone. I was considering giving her Bactrim to which she denied being allergic to. However the epic chart contradicts this.   I will dc vancomycin. Should she worsen off vancomycin, doxy could always be added to go along with her ciprofloxacin course  #3 Morganella  In urine: = colonizer at low on accounts and also given the fact that she is improved dramatically with a different diagnosis being treated with an antibiotic as her activity against her Morganella no need to treat this.   Thank you so much for this interesting consult  I've given the patient my card and will be happy see her as an outpatient in followup as needed.  RLake Arrowheadfor [phone](pager) [phone](office) 10/10/2013, 1:41 PM  CRhina BrackettDam 10/10/2013, 1:41 PM

## 2013-10-11 ENCOUNTER — Telehealth: Payer: Self-pay | Admitting: Pulmonary Disease

## 2013-10-11 LAB — COMPREHENSIVE METABOLIC PANEL
ALBUMIN: 2.5 g/dL — AB (ref 3.5–5.2)
ALK PHOS: 145 U/L — AB (ref 39–117)
ALT: 69 U/L — ABNORMAL HIGH (ref 0–35)
AST: 18 U/L (ref 0–37)
BUN: 18 mg/dL (ref 6–23)
CALCIUM: 8.5 mg/dL (ref 8.4–10.5)
CO2: 25 mEq/L (ref 19–32)
Chloride: 103 mEq/L (ref 96–112)
Creatinine, Ser: 1.13 mg/dL — ABNORMAL HIGH (ref 0.50–1.10)
GFR calc Af Amer: 46 mL/min — ABNORMAL LOW (ref >90)
GFR calc non Af Amer: 40 mL/min — ABNORMAL LOW (ref >90)
Glucose, Bld: 93 mg/dL (ref 70–99)
POTASSIUM: 3.9 meq/L (ref 3.7–5.3)
Sodium: 141 mEq/L (ref 137–147)
Total Bilirubin: 1.3 mg/dL — ABNORMAL HIGH (ref 0.3–1.2)
Total Protein: 5.3 g/dL — ABNORMAL LOW (ref 6.0–8.3)

## 2013-10-11 LAB — CBC
HEMATOCRIT: 24.9 % — AB (ref 36.0–46.0)
Hemoglobin: 8.1 g/dL — ABNORMAL LOW (ref 12.0–15.0)
MCH: 31.4 pg (ref 26.0–34.0)
MCHC: 32.5 g/dL (ref 30.0–36.0)
MCV: 96.5 fL (ref 78.0–100.0)
Platelets: ADEQUATE 10*3/uL (ref 150–400)
RBC: 2.58 MIL/uL — ABNORMAL LOW (ref 3.87–5.11)
RDW: 17.4 % — AB (ref 11.5–15.5)
WBC: 6.4 10*3/uL (ref 4.0–10.5)

## 2013-10-11 LAB — PROCALCITONIN: Procalcitonin: 19.48 ng/mL

## 2013-10-11 MED ORDER — CIPROFLOXACIN HCL 500 MG PO TABS: 500.0000 mg | ORAL_TABLET | ORAL | Status: AC

## 2013-10-11 NOTE — Telephone Encounter (Signed)
Called, spoke with Manuela Schwartz.  Pt was d/c'd today from [facility] and advised to f/u with PCP in 1 wk.  SN has no openings. Offered HFU with TP on Jan 26 -- pt would like to be worked in with SN if at all possible and requesting this to be on Monday, Jan 26.  [facility]/Dr. Lenna Gilford, pls advise if pt can be worked in.  Thank you.

## 2013-10-11 NOTE — Progress Notes (Signed)
I have seen and examined the patient and agree with the assessment and plans. Seen and agree.  Will try and manage her conservatively with oral antibiotics and outpt follow up  [place]. Ninfa Linden  MD, FACS

## 2013-10-11 NOTE — Discharge Summary (Signed)
Physician Discharge Summary  [name] DQ:[id number] DOB: 11-21-1914 DOA: 10/05/2013        PCP: Noralee Space, Anderson  Admit date: 10/05/2013 Discharge date: 10/11/2013  Time spent: 65 minutes  Recommendations for Outpatient Follow-up:  1. Followup with [name] one week post discharge. 2. Followup with Dr. Brantley Stage of general surgery in 2 weeks.  Discharge Diagnoses:  Principal Problem:   SIRS (systemic inflammatory response syndrome) Active Problems:   Choledocholithiasis with obstruction   Cellulitis of leg: Bilateral R> L   HYPOTHYROIDISM   ANXIETY   HYPERTENSION   GERD   RENAL INSUFFICIENCY   Aortic valve disorder   Swelling of limb   Choledocholithiasis   Abdominal pain, acute, right upper quadrant   Cholecystitis, acute   Anemia   CKD (chronic kidney disease), stage III   Discharge Condition: Stable and improved  Diet recommendation: Regular  Filed Weights   10/08/13 0500 10/09/13 0500 10/11/13 0500  Weight: 67.223 kg (148 lb 3.2 oz) 67.541 kg (148 lb 14.4 oz) 67.5 kg (148 lb 13 oz)    History of present illness:  80 yo female highly functioinal lives at home started having ruq abd pain last night. She took some tums and a xanax, the pain subsided. She woke up this am, ate a little bit for breakfast. Ate a small lunch. She had no pain all day. Then after supper she ate and several hours later she started with the same severe sudden onset ruq pain. No n/v. The pain persisted so came to ED. No fevers. Still has gallbladder. Pain was 10/10 on arrival , down to 5/5 with morphine given. She has had no problems with her gallbladder prior to last night (over 24 hours ago). No jaundice. Her only child who is a daughter is present with her tonight in the ED.   Hospital Course:  #1 systemic inflammatory response  Patient was admitted with abdominal pain and noted to have choledocholiathiasis probable acute cholecystitis. Patient was placed empirically on  IV ciprofloxacin to start off with and also on IV vancomycin for lower extremity cellulitis. During the hospitalization posterior was noted to be tachycardic and have a fever. It was felt this was likely secondary to acute cholecystitis. Patient was hydrated with IV fluids and continued on empiric antibiotics. Abdominal ultrasound which was done did show distended gallbladder containing multiple gallstones, positive sonographic Murphy sign consistent with acute cholecystitis, obstructive 0.8 cm gallstone in the distal, mild duct. HIDA scan consistent with acute cholecystitis.  Patient also noted to have bilateral lower extremity cellulitis. Patient underwent ERCP with sphincterectomy. Chest x-ray neg. Blood cultures x2 with no growth to date. UA Neg.  Lactic acid level neg. Pro calcitonin level elevated at 6.59. Patient improved clinically on empiric IV antibiotics. Patient's white count trended down. Patient underwent a unsuccessful placement of cholecystotomy drain. Patient was maintained on antibiotics which was narrowed down to ciprofloxacin. General surgery felt as patient was improving to treat medically with antibiotics. ID consultation was obtained and patient was seen in consultation by Dr. Lucianne Lei Anderson who had recommended 3-7 more days of oral ciprofloxacin to complete a course of antibiotic therapy. Patient improved clinically was afebrile by day of discharge and was discharged in stable and improved condition will followup with PCP in general surgery as outpatient. #2 gallstones with probable acute cholecystitis and choledocholithiasis status post ERCP with sphincterectomy and stone extraction 10/06/2013  Patient was admitted with acute cholecystitis and choledocholithiasis. Patient underwent  ERCP with sphincterectomy  and stone extraction. Abdominal ultrasound which was done on admission was consistent with acute cholecystitis and multiple gallstones and nonobstructing the stone in the common bile duct.  LFTs trended down. Patient was started empirically on IV ciprofloxacin.  Patient was been seen by general surgeon, HIDA scan was done and consistent with acute cholecystitis. Patient underwent a unsuccessful placement of cholecystostomy drain for acute cholecystitis. Patient was maintained on IV antibiotics she improved clinically became afebrile with return of a white count. It was felt that as patient was improving clinically that she be treated with antibiotics empirically on followup with general surgery as outpatient. ID was consulted and made recommendations in terms of antibiotic duration and treatment and was recommended that patient receive another 3-7 days of oral ciprofloxacin to complete a 10-14 day course of antibiotic therapy. Patient will followup with general surgery as outpatient. #3 bilateral lower extremity cellulitis  She was done on empiric IV vancomycin for bilateral lower extremity cellulitis. These improved clinically. Patient was transitioned to oral ciprofloxacin to complete a course of therapy. #4 chronic kidney disease stage III  Remained stable throughout the hospitalization. #5 hypertension  Continued on Cardizem.  #6 hypothyroidism  Continued on Synthroid.  #7 gastroesophageal reflux disease  Maintained on a PPI.   Procedures: Abdominal ultrasound 10/06/2013 Chest x-ray 10/06/2013 ERCP with sphincterectomy/papillotomy and ERCP with removal of calculi 10/06/2013 per Dr. Fuller Plan HIDA scan 10/07/2013 Unsuccesful perc cholesytostomy 10/08/13   Consultations: Gastroenterology: Dr. Fuller Plan 10/06/2013  General surgery: Dr. Georganna Skeans 10/06/2013  Cardiology: Dr. Gwenlyn Found 10/06/2013 Infectious diseases: Dr. Rhina Brackett Anderson 10/10/2013  Discharge Exam: Filed Vitals:   10/11/13 1019  BP: 147/52  Pulse: 85  Temp: 98.2 F (36.8 C)  Resp: 18    General: NAD Cardiovascular: RRR Respiratory: CTAB  Discharge Instructions  Discharge Orders   Future Appointments  Provider Department Dept Phone   12/15/2013 12:00 PM Noralee Space, Anderson [facility] [phone]   Future Orders Complete By Expires   Diet general  As directed    Discharge instructions  As directed    Comments:     Follow up with [name] in 1 week. Follow up with Dr [name] Stage in 2 weeks.   Increase activity slowly  As directed        Medication List         allopurinol 100 MG tablet  Commonly known as:  ZYLOPRIM  Take 100 mg by mouth daily.     ALPRAZolam 0.25 MG tablet  Commonly known as:  XANAX  TAKE 1/2 TO 1 TABLET 3 TIMES A DAY AS NEEDED     aspirin 81 MG tablet  Take 81 mg by mouth daily.     ciprofloxacin 500 MG tablet  Commonly known as:  CIPRO  Take 1 tablet (500 mg total) by mouth daily. Take for 7 days then stop.     diltiazem 180 MG 24 hr capsule  Commonly known as:  CARDIZEM CD  TAKE 1 CAPSULE DAILY     furosemide 40 MG tablet  Commonly known as:  LASIX  TAKE 2 TABLETS BY MOUTH ONCE EVERY MORNING     levothyroxine 125 MCG tablet  Commonly known as:  SYNTHROID, LEVOTHROID  Take 1/2 tablet By mouth once daily     omeprazole 20 MG capsule  Commonly known as:  PRILOSEC  Take 20 mg by mouth daily.     simvastatin 20 MG tablet  Commonly known as:  ZOCOR  TAKE 1 TAB BY  MOUTH AT BEDTIME     SYSTANE 0.4-0.3 % Soln  Generic drug:  Polyethyl Glycol-Propyl Glycol  One drop in each eye two times a day     timolol 0.5 % ophthalmic solution  Commonly known as:  TIMOPTIC  Place 1 drop into both eyes 2 (two) times daily.     traMADol 50 MG tablet  Commonly known as:  ULTRAM  Take 1 tablet (50 mg total) by mouth 3 (three) times daily as needed.     triamcinolone 0.025 % cream  Commonly known as:  KENALOG  Apply 1 application topically Twice daily.     TYLENOL 325 MG tablet  Generic drug:  acetaminophen  Per bottle as needed     vitamin C 500 MG tablet  Commonly known as:  ASCORBIC ACID  Take 500 mg by mouth daily.     Vitamin D  1000 UNITS capsule  Take 1,000 Units by mouth daily.       Allergies  Allergen Reactions  . Enablex [Darifenacin Hydrobromide Er]     Caused her throat and mouth to have bumps and feel like it was swelling  . Clarithromycin Swelling    REACTION: causes her mouth to swell  . Codeine Other (See Comments)    Makes pt nervous  . Morphine Nausea And Vomiting    REACTION: n/v  . Sulfonamide Derivatives Other (See Comments)    REACTION: unsure of reaction       Follow-up Information   Follow up with CORNETT,THOMAS A., Anderson. Schedule an appointment as soon as possible for a visit in 2 weeks.   Specialty:  General Surgery   Contact information:   [address] [place] [address] [phone]       Follow up with [name]. Schedule an appointment as soon as possible for a visit in 1 week.   Specialty:  Pulmonary Disease   Contact information:   [place] [address] [phone]        The results of significant diagnostics from this hospitalization (including imaging, microbiology, ancillary and laboratory) are listed below for reference.    Significant Diagnostic Studies: Dg Chest 2 View  10/06/2013   CLINICAL DATA:  Chest pain and nausea.  EXAM: CHEST  2 VIEW  COMPARISON:  Chest radiograph December 19, 2010  FINDINGS: Cardiac silhouette remains mildly enlarged, mediastinal silhouette is nonsuspicious, mildly calcified aorta. No pleural effusions or focal consolidations. Nodular density seen in left lung base on prior radiograph is less conspicuous today. No pneumothorax.  Patient is osteopenic.  Thoracolumbar dextroscoliosis.  IMPRESSION: Mild cardiomegaly, no acute pulmonary process.   Electronically Signed   By: Elon Alas   On: 10/06/2013 01:10   Nm Hepatobiliary  10/07/2013   CLINICAL DATA:  Right upper quadrant. ERCP with sphincterotomy same day  EXAM: NUCLEAR MEDICINE HEPATOBILIARY IMAGING  TECHNIQUE: Sequential images of the  abdomen were obtained out to 60 minutes following intravenous administration of radiopharmaceutical.  COMPARISON:  DG ERCP WO/W SPHINCTEROTOMY dated 10/06/2013; US ABDOMEN COMPLETE dated 10/06/2013  RADIOPHARMACEUTICALS:  5.0 mCi Tc-43m Choletec  FINDINGS: Homogeneous uptake of radiotracer within the liver. Bile is excreted into the the common bile duct and flows into the small bowel by 15 min. Examination was continued to 120 min. There was no filling of the gallbladder over 120 min time. Patient has a morphine allergy.  IMPRESSION: 1. No filling of the gallbladder suggests obstruction of the cystic duct / acute cholecystitis.  2.  Patent common bile duct.  Findings conveyed toDr.  Marcello Moores On 10/07/2013  [id number].   Electronically Signed   By: Suzy Bouchard M.D.   On: 10/07/2013 18:02   US Abdomen Complete  10/06/2013   CLINICAL DATA:  80 year old with right upper quadrant abdominal pain.  EXAM: ULTRASOUND ABDOMEN COMPLETE  COMPARISON:  CT abdomen and pelvis 10/09/2010.  FINDINGS: Gallbladder:  Moderately distended gallbladder containing shadowing gallstones, the largest approximating 2 cm. 1 cm gallstone in the neck of the gallbladder. Moderate echogenic sludge. No gallbladder wall thickening. Positive sonographic Murphy sign according to the ultrasound technologist.  Common bile duct:  Diameter: 16 mm. Approximate 0.8 cm gallstone in the distal common bile duct.  Liver:  Normal size and echotexture without focal parenchymal abnormality. Patent portal vein with hepatopetal flow. Mild intrahepatic biliary ductal dilation.  IVC:  Normal.  Pancreas:  Not visualized due to midline bowel gas.  Spleen:  Normal size and echotexture without focal parenchymal abnormality.  Right Kidney:  Length: Approximately 9.6 cm. Mild diffuse cortical thinning. No hydronephrosis. Multiple cysts as noted on the prior CT, the largest approximating 1.6 cm. No solid renal masses.  Left Kidney:  Length: Approximately 8.5 cm. Mild diffuse  cortical thinning. No hydronephrosis. Multiple cysts as noted on the prior CT. No solid renal masses.  Abdominal aorta:  Not visualized due to midline bowel gas.  Other findings:  None.  IMPRESSION: 1. Distended gallbladder containing multiple gallstones, the largest approximating 2 cm, including a 1 cm gallstone in the gallbladder neck. Positive sonographic Murphy sign according to the ultrasound technologist would be consistent with acute cholecystitis. 2. Obstructing 0.8 cm gallstone in the distal common bile duct. Maximum common bile duct diameter 16 mm. Mild intrahepatic biliary ductal dilation. 3. Cortical thinning involving both kidneys and bilateral renal cysts. 4. Nonvisualization of the pancreas and the abdominal aorta due to the midline bowel gas.   Electronically Signed   By: Evangeline Dakin M.D.   On: 10/06/2013 01:37   Dg Chest Port 1 View  10/07/2013   CLINICAL DATA:  Fever, cough and chills  EXAM: PORTABLE CHEST - 1 VIEW  COMPARISON:  Prior chest x-ray 10/06/2013  FINDINGS: Stable mild cardiomegaly. Inspiratory volumes are lower. This results in crowding of the pulmonary vasculature and slight Lee increased prominence of the interstitial markings. Stable atherosclerotic and tortuous thoracic aorta. No new focal airspace consolidation. No pleural effusion or pneumothorax.  IMPRESSION: Lower inspiratory volumes results in a crowding of the pulmonary vasculature.  No new focal airspace consolidation to suggest pneumonia.   Electronically Signed   By: Jacqulynn Cadet M.D.   On: 10/07/2013 09:58   Dg Ercp Biliary & Pancreatic Ducts  10/06/2013   CLINICAL DATA:  Common bile duct stones.  EXAM: ERCP  TECHNIQUE: Multiple spot images obtained with the fluoroscopic device and submitted for interpretation post-procedure.  COMPARISON:  Abdominal ultrasound 10/06/2013.  FINDINGS: Fluoroscopic spot images from an ERCP demonstrate cannulation of the common bile duct with subsequent injection of contrast  material demonstrating dilatation of the common bile duct and multiple gallstones. Common bile duct stones are also suspected. Subsequent sphincterotomy and balloon passage.  IMPRESSION: Cholelithiasis and choledocholithiasis with subsequent sphincterotomy and balloon passage.  These images were submitted for radiologic interpretation only. Please see the procedural report for the amount of contrast and the fluoroscopy time utilized.   Electronically Signed   By: Kalman Jewels M.D.   On: 10/06/2013 16:16   Ct Image Guided Drainage  By Percutaneous Catheter  10/08/2013   CLINICAL DATA:  80 year old with bile duct stones and concern for cholecystitis. Request for a percutaneous cholecystostomy tube placement.  EXAM: ATTEMPTED CT-GUIDED CHOLECYSTOSTOMY TUBE PLACEMENT  ANESTHESIA/SEDATION: 6.0 Mg IV Versed 200 mcg IV Fentanyl  Total Moderate Sedation Time:  1 hr and 45 min  PROCEDURE: The procedure, risks, benefits, and alternatives were explained to the patient. Questions regarding the procedure were encouraged and answered. The patient understands and consents to the procedure.  The right upper quadrant was evaluated with ultrasound. Gallbladder could not be well-visualized with ultrasound due to stones and bowel gas. As result, the patient was transferred to the CT suite. Patient was placed on the CT scanner. Images through the abdomen were obtained. The right upper abdomen was prepped and draped in sterile fashion. Maximal barrier sterile technique was utilized including caps, mask, sterile gowns, sterile gloves, sterile drape, hand hygiene and skin antiseptic. The skin was anesthetized with 1% lidocaine. An 18 gauge needle was directed towards the gallbladder multiple times with CT guidance. Needle appeared to be within or near the gallbladder wall on multiple occasions but unable to aspirate any bile or advance wire in the gallbladder. As result, the 18 gauge needle was exchanged for a 21 gauge needle. Again,  a 21 gauge needle was directed into the gallbladder from a transhepatic approach. It appeared that the needle was within the gallbladder but it was difficult to advance a wire. At one point, it appeared that a wire was within the gallbladder and an Accustick dilator set was placed. A 10 [place] multipurpose drain was advanced over a J-wire but the tube appeared to be in the pericholecystic space. Unable to aspirate any fluid from this drain. As result, this drain was removed.  The patient was re-prepped and draped. 18 gauge needle was directed towards the gallbladder fundus again with CT guidance. Again, needle would not successfully advance through the wall. Again, a 21 gauge needle was directed towards the gallbladder fundus with CT guidance. The needle appeared to be through the wall but when a wire was advanced, the wire would be in the perihepatic space. At this point, the procedure was stopped.  COMPLICATIONS: None  FINDINGS: Gallbladder appears to be markedly thickening with stones. On multiple occasions, a needle appeared to be within the gallbladder lumen. When the 10 [place] drain was placed, the tube appeared to be outside of the gallbladder lumen and no fluid could be aspirated from this drain. As result, this tube was removed.  IMPRESSION: Unsuccessful percutaneous cholecystostomy tube placement. The procedure was likely unsuccessful because of the thickened gallbladder and insufficient distention of the gallbladder.   Electronically Signed   By: Markus Daft M.D.   On: 10/08/2013 14:33    Microbiology: Recent Results (from the past 240 hour(s))  CULTURE, BLOOD (ROUTINE X 2)     Status: None   Collection Time    10/07/13 11:08 AM      Result Value Range Status   Specimen Description BLOOD RIGHT HAND   Final   Special Requests BOTTLES DRAWN AEROBIC ONLY [facility]   Final   Culture  Setup Time     Final   Value: 10/07/2013 16:26     Performed at [facility]   Culture     Final   Value:         BLOOD CULTURE RECEIVED NO GROWTH TO DATE CULTURE WILL BE HELD FOR 5 DAYS BEFORE ISSUING A FINAL NEGATIVE REPORT  Performed at [facility]   Report Status PENDING   Incomplete  URINE CULTURE     Status: None   Collection Time    10/07/13 12:16 PM      Result Value Range Status   Specimen Description URINE, CLEAN CATCH   Final   Special Requests NONE   Final   Culture  Setup Time     Final   Value: 10/07/2013 16:47     Performed at [facility]     Final   Value: 45,000 COLONIES/ML     Performed at [facility]   Culture     Final   Value: [facility]ORGANII     Performed at [facility]   Report Status 10/09/2013 FINAL   Final   Organism ID, Bacteria [facility] MORGANII   Final     Labs: Basic Metabolic Panel:  Recent Labs Lab 10/07/13 0749 10/08/13 0430 10/09/13 0906 10/10/13 0715 10/11/13 0350  NA 138 138 140 139 141  K 4.2 3.9 3.9 3.8 3.9  CL 100 103 104 103 103  CO2 28 24 24 24 25   GLUCOSE 100* 81 111* 112* 93  BUN 31* 33* 26* 20 18  CREATININE 1.53* 1.40* 1.11* 1.10 1.13*  CALCIUM 8.4 8.1* 8.5 8.3* 8.5   Liver Function Tests:  Recent Labs Lab 10/07/13 0749 10/08/13 0430 10/09/13 0906 10/10/13 0715 10/11/13 0350  AST 390* 138* 54* 24 18  ALT 318* 191* 137* 86* 69*  ALKPHOS 209* 175* 171* 144* 145*  BILITOT 4.4* 2.6* 1.8* 1.3* 1.3*  PROT 5.7* 4.9* 5.6* 5.1* 5.3*  ALBUMIN 2.6* 2.2* 2.5* 2.3* 2.5*    Recent Labs Lab 10/06/13 0019  LIPASE 22   No results found for this basename: AMMONIA,  in the last 168 hours CBC:  Recent Labs Lab 10/06/13 0019  10/07/13 0749 10/08/13 0430 10/09/13 0906 10/10/13 0715 10/11/13 0350  WBC 9.7  < > 10.1 15.2* 8.0 6.3 6.4  NEUTROABS 7.1  --  7.7 12.4* 5.7  --   --   HGB 10.1*  < > 9.2* 8.1* 8.7* 7.5* 8.1*  HCT 31.2*  < > 28.2* 24.6* 27.3* 23.3* 24.9*  MCV 95.4  < > 97.6 95.7 98.2 97.1 96.5  PLT 190  < > 213 172 208 173 PLATELET CLUMPS NOTED ON SMEAR, COUNT  APPEARS ADEQUATE  < > = values in this interval not displayed. Cardiac Enzymes:  Recent Labs Lab 10/06/13 0019  TROPONINI <0.30   BNP: BNP (last 3 results)  Recent Labs  10/06/13 0019  PROBNP 329.3   CBG: No results found for this basename: GLUCAP,  in the last 168 hours     Signed:  [facility] 10/11/2013, 12:38 PM

## 2013-10-11 NOTE — Progress Notes (Signed)
Patient ID: [name], female   DOB: [birth year], 80 y.o.   MRN: [id number] 5 Days Post-Op  Subjective: Pt feels ok today.  Didn't sleep well last night, but no abdominal pain and is tolerating a regular diet.  Objective: Vital signs in last 24 hours: Temp:  [97.4 F (36.3 C)-98.1 F (36.7 C)] 97.4 F (36.3 C) (01/19 0618) Pulse Rate:  [77-87] 87 (01/19 0618) Resp:  [16-18] 18 (01/19 0618) BP: (139-142)/(51-54) 142/51 mmHg (01/19 0618) SpO2:  [92 %-95 %] 92 % (01/19 0618) Weight:  [148 lb 13 oz (67.5 kg)] 148 lb 13 oz (67.5 kg) (01/19 0500) Last BM Date: 10/07/13  Intake/Output from previous day:   Intake/Output this shift:    PE: Abd: soft, NT, ND, +BS  Lab Results:   Recent Labs  10/10/13 0715 10/11/13 0350  WBC 6.3 6.4  HGB 7.5* 8.1*  HCT 23.3* 24.9*  PLT 173 PLATELET CLUMPS NOTED ON SMEAR, COUNT APPEARS ADEQUATE   BMET  Recent Labs  10/10/13 0715 10/11/13 0350  NA 139 141  K 3.8 3.9  CL 103 103  CO2 24 25  GLUCOSE 112* 93  BUN 20 18  CREATININE 1.10 1.13*  CALCIUM 8.3* 8.5   PT/INR No results found for this basename: LABPROT, INR,  in the last 72 hours CMP     Component Value Date/Time   NA 141 10/11/2013 0350   K 3.9 10/11/2013 0350   CL 103 10/11/2013 0350   CO2 25 10/11/2013 0350   GLUCOSE 93 10/11/2013 0350   BUN 18 10/11/2013 0350   CREATININE 1.13* 10/11/2013 0350   CALCIUM 8.5 10/11/2013 0350   PROT 5.3* 10/11/2013 0350   ALBUMIN 2.5* 10/11/2013 0350   AST 18 10/11/2013 0350   ALT 69* 10/11/2013 0350   ALKPHOS 145* 10/11/2013 0350   BILITOT 1.3* 10/11/2013 0350   GFRNONAA 40* 10/11/2013 0350   GFRAA 46* 10/11/2013 0350   Lipase     Component Value Date/Time   LIPASE 22 10/06/2013 0019       Studies/Results: No results found.  Anti-infectives: Anti-infectives   Start     Dose/Rate Route Frequency Ordered Stop   10/10/13 1500  ciprofloxacin (CIPRO) tablet 500 mg     500 mg Oral Every 24 hours 10/10/13 1352     10/10/13 1345   ciprofloxacin (CIPRO) tablet 250 mg  Status:  Discontinued     250 mg Oral 2 times daily 10/10/13 1340 10/10/13 1351   10/07/13 1200  vancomycin (VANCOCIN) 500 mg in sodium chloride 0.9 % 100 mL IVPB  Status:  Discontinued     500 mg 100 mL/hr over 60 Minutes Intravenous Every 24 hours 10/06/13 1002 10/10/13 1338   10/07/13 0600  ciprofloxacin (CIPRO) IVPB 400 mg  Status:  Discontinued     400 mg 200 mL/hr over 60 Minutes Intravenous Every 24 hours 10/06/13 0337 10/10/13 1340   10/06/13 1015  vancomycin (VANCOCIN) IVPB 1000 mg/200 mL premix     1,000 mg 200 mL/hr over 60 Minutes Intravenous  Once 10/06/13 1002 10/06/13 1222   10/06/13 0230  ciprofloxacin (CIPRO) IVPB 400 mg  Status:  Discontinued     400 mg 200 mL/hr over 60 Minutes Intravenous Every 12 hours 10/06/13 0216 10/06/13 0337       Assessment/Plan  1. Acute cholecystitis, improving with abx therapy  Plan: 1. Patient unable to get a perc chole drain.  She has improved on abx therapy.  we will defer surgery at this  time.  She has been switched to oral Cipro per ID recommendations.  She is stable for dc home.  She will need to follow up in our office with Dr. Brantley Stage in 2-3 weeks.   LOS: 6 days    Christine Schiefelbein E 10/11/2013, 8:42 AM Pager: [phone]

## 2013-10-11 NOTE — Discharge Instructions (Signed)
Cholecystitis Cholecystitis is an inflammation of your gallbladder. It is usually caused by a buildup of gallstones or sludge (cholelithiasis) in your gallbladder. The gallbladder stores a fluid that helps digest fats (bile). Cholecystitis is serious and needs treatment right away.  CAUSES   Gallstones. Gallstones can block the tube that leads to your gallbladder, causing bile to build up. As bile builds up, the gallbladder becomes inflamed.  Bile duct problems, such as blockage from scarring or kinking.  Tumors. Tumors can stop bile from leaving your gallbladder correctly, causing bile to build up. As bile builds up, the gallbladder becomes inflamed. SYMPTOMS   Nausea.  Vomiting.  Abdominal pain, especially in the upper right area of your abdomen.  Abdominal tenderness or bloating.  Sweating.  Chills.  Fever.  Yellowing of the skin and the whites of the eyes (jaundice). DIAGNOSIS  Your caregiver may order blood tests to look for infection or gallbladder problems. Your caregiver may also order imaging tests, such as an ultrasound or computed tomography (CT) scan. Further tests may include a hepatobiliary iminodiacetic acid (HIDA) scan. This scan allows your caregiver to see your bile move from the liver to the gallbladder and to the small intestine. TREATMENT  A hospital stay is usually necessary to lessen the inflammation of your gallbladder. You may be required to not eat or drink (fast) for a certain amount of time. You may be given medicine to treat pain or an antibiotic medicine to treat an infection. Surgery may be needed to remove your gallbladder (cholecystectomy) once the inflammation has gone down. Surgery may be needed right away if you develop complications such as death of gallbladder tissue (gangrene) or a tear (perforation) of the gallbladder.  HOME CARE INSTRUCTIONS  Home care will depend on your treatment. In general:  If you were given antibiotics, take them as  directed. Finish them even if you start to feel better.  Only take over-the-counter or prescription medicines for pain, discomfort, or fever as directed by your caregiver.  Follow a low-fat diet until you see your caregiver again.  Keep all follow-up visits as directed by your caregiver. SEEK IMMEDIATE MEDICAL CARE IF:   Your pain is increasing and not controlled by medicines.  Your pain moves to another part of your abdomen or to your back.  You have a fever.  You have nausea and vomiting. MAKE SURE YOU:  Understand these instructions.  Will watch your condition.  Will get help right away if you are not doing well or get worse. Document Released: 09/09/2005 Document Revised: 12/02/2011 Document Reviewed: 07/26/2011 Exit[employer] Patient Information 2014 [employer].  

## 2013-10-12 NOTE — Telephone Encounter (Signed)
Per SN---  Ok to add pt on to schedule on 1/30 at 12.  thanks

## 2013-10-12 NOTE — Telephone Encounter (Signed)
Spoke with Manuela Schwartz. Pt has been added to this date. Nothing further needed

## 2013-10-13 LAB — CULTURE, BLOOD (ROUTINE X 2): Culture: NO GROWTH

## 2013-10-18 ENCOUNTER — Ambulatory Visit (INDEPENDENT_AMBULATORY_CARE_PROVIDER_SITE_OTHER): Payer: Medicare Other | Admitting: Adult Health

## 2013-10-18 ENCOUNTER — Other Ambulatory Visit (INDEPENDENT_AMBULATORY_CARE_PROVIDER_SITE_OTHER): Payer: Medicare Other

## 2013-10-18 ENCOUNTER — Encounter: Payer: Self-pay | Admitting: Adult Health

## 2013-10-18 ENCOUNTER — Encounter (HOSPITAL_COMMUNITY): Payer: Self-pay | Admitting: Emergency Medicine

## 2013-10-18 VITALS — BP 134/72

## 2013-10-18 DIAGNOSIS — L02419 Cutaneous abscess of limb, unspecified: Secondary | ICD-10-CM

## 2013-10-18 DIAGNOSIS — R6510 Systemic inflammatory response syndrome (SIRS) of non-infectious origin without acute organ dysfunction: Secondary | ICD-10-CM | POA: Diagnosis not present

## 2013-10-18 DIAGNOSIS — Z8709 Personal history of other diseases of the respiratory system: Secondary | ICD-10-CM | POA: Insufficient documentation

## 2013-10-18 DIAGNOSIS — Z79899 Other long term (current) drug therapy: Secondary | ICD-10-CM | POA: Insufficient documentation

## 2013-10-18 DIAGNOSIS — Z23 Encounter for immunization: Secondary | ICD-10-CM | POA: Insufficient documentation

## 2013-10-18 DIAGNOSIS — S5010XA Contusion of unspecified forearm, initial encounter: Secondary | ICD-10-CM | POA: Diagnosis not present

## 2013-10-18 DIAGNOSIS — E559 Vitamin D deficiency, unspecified: Secondary | ICD-10-CM | POA: Insufficient documentation

## 2013-10-18 DIAGNOSIS — S8000XA Contusion of unspecified knee, initial encounter: Secondary | ICD-10-CM | POA: Insufficient documentation

## 2013-10-18 DIAGNOSIS — Y929 Unspecified place or not applicable: Secondary | ICD-10-CM | POA: Insufficient documentation

## 2013-10-18 DIAGNOSIS — S01501A Unspecified open wound of lip, initial encounter: Secondary | ICD-10-CM | POA: Insufficient documentation

## 2013-10-18 DIAGNOSIS — K8051 Calculus of bile duct without cholangitis or cholecystitis with obstruction: Secondary | ICD-10-CM | POA: Diagnosis not present

## 2013-10-18 DIAGNOSIS — L03119 Cellulitis of unspecified part of limb: Secondary | ICD-10-CM

## 2013-10-18 DIAGNOSIS — S8990XA Unspecified injury of unspecified lower leg, initial encounter: Secondary | ICD-10-CM | POA: Diagnosis not present

## 2013-10-18 DIAGNOSIS — I872 Venous insufficiency (chronic) (peripheral): Secondary | ICD-10-CM | POA: Insufficient documentation

## 2013-10-18 DIAGNOSIS — M1990 Unspecified osteoarthritis, unspecified site: Secondary | ICD-10-CM | POA: Insufficient documentation

## 2013-10-18 DIAGNOSIS — I129 Hypertensive chronic kidney disease with stage 1 through stage 4 chronic kidney disease, or unspecified chronic kidney disease: Secondary | ICD-10-CM | POA: Diagnosis not present

## 2013-10-18 DIAGNOSIS — W010XXA Fall on same level from slipping, tripping and stumbling without subsequent striking against object, initial encounter: Secondary | ICD-10-CM | POA: Insufficient documentation

## 2013-10-18 DIAGNOSIS — E039 Hypothyroidism, unspecified: Secondary | ICD-10-CM | POA: Insufficient documentation

## 2013-10-18 DIAGNOSIS — Z8669 Personal history of other diseases of the nervous system and sense organs: Secondary | ICD-10-CM | POA: Diagnosis not present

## 2013-10-18 DIAGNOSIS — N183 Chronic kidney disease, stage 3 unspecified: Secondary | ICD-10-CM | POA: Insufficient documentation

## 2013-10-18 DIAGNOSIS — Z7982 Long term (current) use of aspirin: Secondary | ICD-10-CM | POA: Insufficient documentation

## 2013-10-18 DIAGNOSIS — F411 Generalized anxiety disorder: Secondary | ICD-10-CM | POA: Insufficient documentation

## 2013-10-18 DIAGNOSIS — E7800 Pure hypercholesterolemia, unspecified: Secondary | ICD-10-CM | POA: Diagnosis not present

## 2013-10-18 DIAGNOSIS — K219 Gastro-esophageal reflux disease without esophagitis: Secondary | ICD-10-CM | POA: Insufficient documentation

## 2013-10-18 DIAGNOSIS — M25569 Pain in unspecified knee: Secondary | ICD-10-CM | POA: Diagnosis not present

## 2013-10-18 DIAGNOSIS — Y9389 Activity, other specified: Secondary | ICD-10-CM | POA: Insufficient documentation

## 2013-10-18 DIAGNOSIS — W1809XA Striking against other object with subsequent fall, initial encounter: Secondary | ICD-10-CM | POA: Insufficient documentation

## 2013-10-18 LAB — BASIC METABOLIC PANEL
BUN: 28 mg/dL — AB (ref 6–23)
CALCIUM: 8.8 mg/dL (ref 8.4–10.5)
CHLORIDE: 97 meq/L (ref 96–112)
CO2: 30 meq/L (ref 19–32)
Creatinine, Ser: 1.7 mg/dL — ABNORMAL HIGH (ref 0.4–1.2)
GFR: 29.57 mL/min — ABNORMAL LOW (ref >60.00)
GLUCOSE: 104 mg/dL — AB (ref 70–99)
Potassium: 4.3 mEq/L (ref 3.5–5.1)
Sodium: 137 mEq/L (ref 135–145)

## 2013-10-18 LAB — CBC WITH DIFFERENTIAL/PLATELET
BASOS PCT: 0.4 % (ref 0.0–3.0)
Basophils Absolute: 0 10*3/uL (ref 0.0–0.1)
EOS PCT: 1.7 % (ref 0.0–5.0)
Eosinophils Absolute: 0.2 10*3/uL (ref 0.0–0.7)
HEMATOCRIT: 29.1 % — AB (ref 36.0–46.0)
HEMOGLOBIN: 9.4 g/dL — AB (ref 12.0–15.0)
LYMPHS ABS: 2.5 10*3/uL (ref 0.7–4.0)
LYMPHS PCT: 21.4 % (ref 12.0–46.0)
MCHC: 32.5 g/dL (ref 30.0–36.0)
MCV: 96.2 fl (ref 78.0–100.0)
Monocytes Absolute: 0.6 10*3/uL (ref 0.1–1.0)
Monocytes Relative: 5.2 % (ref 3.0–12.0)
NEUTROS ABS: 8.1 10*3/uL — AB (ref 1.4–7.7)
Neutrophils Relative %: 71.3 % (ref 43.0–77.0)
Platelets: 181 10*3/uL (ref 150.0–400.0)
RBC: 3.03 Mil/uL — ABNORMAL LOW (ref 3.87–5.11)
RDW: 19.2 % — AB (ref 11.5–14.6)
WBC: 11.4 10*3/uL — ABNORMAL HIGH (ref 4.5–10.5)

## 2013-10-18 LAB — HEPATIC FUNCTION PANEL
ALBUMIN: 3.5 g/dL (ref 3.5–5.2)
ALT: 25 U/L (ref 0–35)
AST: 23 U/L (ref 0–37)
Alkaline Phosphatase: 119 U/L — ABNORMAL HIGH (ref 39–117)
BILIRUBIN DIRECT: 0.5 mg/dL — AB (ref 0.0–0.3)
TOTAL PROTEIN: 6.8 g/dL (ref 6.0–8.3)
Total Bilirubin: 1.4 mg/dL — ABNORMAL HIGH (ref 0.3–1.2)

## 2013-10-18 NOTE — Assessment & Plan Note (Signed)
Improved with abs Has chronic venous insufficiency with statis dermatitis   Advised on TEDS  Leg elevation  Please contact office for sooner follow up if symptoms do not improve or worsen or seek emergency care

## 2013-10-18 NOTE — Patient Instructions (Signed)
Labs today  Follow up with surgeon next week as planned  Low salt diet  Keep legs elevated.  Please contact office for sooner follow up if symptoms do not improve or worsen or seek emergency care  follow up with Dr. Lenna Gilford  As planned and  As needed

## 2013-10-18 NOTE — Progress Notes (Signed)
Subjective:    Patient ID: Barbaraann Share, female    DOB: Jun 10, 1915, 80 y.o.   MRN: [id number]  HPI 80 y/o WF here for a follow up visit... she has mult med problems including:  Hx asthmatic bronchitis;  HBP;  Ven Insuffic & edema;  Hyperchol;  Hypothyroid;  GERD/ Divertics/ Colon polyps;  Renal insuffic;  DJD;  Vit D defic;  Anxiety...    10/18/2013 [facility] follow up  Patient returns for a one-week post hospital followup. The patient was admitted January 13 through January 19 for bilateral lower extremity cellulitis with right greater than left, Sirs, and cholelithiasis, with obstruction Patient was treated with aggressive IV antibiotics, IV fluid resuscitation, Abdominal ultrasound showed an acute cholecystitis, and obstructive 0.8 cm gallstone in the distal , mid duct. HIDA scan, also, consistent with cholecystitis Patient underwent an ERCP with sphincterectomy and stone extraction Patient also had an unsuccessful placement of a cholecystostomy drain. Patient was discharged on a seven-day course of Cipro. She has been set up with general surgery in a week for followup. May look at future cholecystectomy .  Since discharge. She is feeling improved, abdominal pain, has subsided.  No fever, chest pain, increased edema , bloody stools, or orthopnea.  Legs are some improved but swell every day          Problem List:  GLAUCOMA (ICD-365.9) - hx mult eye problems w/ prev corneal transplant & glaucoma laser eye surg... on eye drops per Ophthalmology at [facility]...  ASTHMATIC BRONCHITIS, ACUTE (ICD-466.0) - Hx AB requiring Rx w/ Depo/ Pred/ Mucinex/ Tussionex... ~  CXR 4/11 showed vague nodular densities on left... ~  8/11:  she went to an [facility] w/ cough, sputum, & dx w/ pneumonia rx w/ ZPak... ~  CXR 3/12 showed borderline cardiomeg, nodular opac left base, NAD... ~  Spring 2013: she notes some allergy symptoms...  HYPERTENSION (ICD-401.9) - controlled on ASA 81mg /d,  CARTIAXT  180mg /d, LASIX 40mg -  1-2Qam... ~  7/10:  labs showed BUN=54, Creat=2.0 & rec to decr diuretics in half= one Demedex & 1/2 Aldactone. ~  11/10:  labs showed BUN=38, Creat=1.8 & rec to keep same meds. ~  8/11:  labs showed BUN=42, Creat=2.1, K=4.3 ~  1/12:  labs in ER showed BUN=56, Creat=2.67, therefore diuretics decr to Demadex20/d only. ~  3/12:  2DEcho showed mod LVH, norm sys function w/ EF=60-65% & no regional wall motion abn; Gr1DD, mibile density on AoV- prob lambl'e escrescence, mildMR. ~  5/12:  f/u labs in office showed BUN=45, Creat=1.8, on Lasix 80mg  Qam for her edema... ~  9/12:  Labs by DrDeveshwar 9/12 showed BUN=33, Creat=1.47, edema resolved therefore decr Lasix to 40mg /d. ~  1/13:  BP= 160/76 but hasn't taken meds today; labs improved w/ BUN=26, Creat=1.3, BNP=50 ~  5/13:  BP= 140/78 & denies CP, palpit, SOB, edema; labs improved w/ BUN=28, Creat=1.2 ~  9/13:  BP= 140/60 & she denies CP, palpt, ch in SOB or edema... ~  12/13: controlled on Diltiazem180 & Furosemide 40mg -2Qam w/ BP=138/62 today & she denies CP, palpit, dizzy, ch inSOB, etc... ~  3/14:  BP= 130/66 on same meds; Labs stable as well w/ Cr=1.5, continue same... ~  7/14:  on Diltiazem180 & Furosemide 40mg -2Qam w/ BP=150/64 today & she denies CP, palpit, dizzy, ch in SOB, etc. ~  11/14: BP= 126/64 on same meds, stable...  PALPITATIONS, HX OF (ICD-V12.50)  VENOUS INSUFFICIENCY (ICD-459.81) - swelling controlled w/ diuretics, low sodium diet, elevation,  support hose, etc... ~  She saw DrEarly [facility]- there was nothing he could do... ~  Swelling diminished w/ low sodium, elevation, support hose, & Lasix40 1-2tabs daily... ~  Nov-Dec2013: she knows to elim sodium, elevate, wear support hose; she has been seen by [facility], DrEarly & there is nothing they can do; recently checked by [facility] Mccallum- DrJones w/ patch dressing & support hose; edema decr w/ Lasix 80mg /d; Lab today shows Creat=1.5.[name]. ~  7/14:  she knows to elim sodium, elevate,  wear support hose, & the Lasix80; weight is down 10# today to 141# w/ resolution of edema; [name] (Derm) treated dermatitis w/ new cream & improved. ~  11/14: wt is back up 8# to 149# today & she needs to elim sodium, elev legs, etc...  HYPERCHOLESTEROLEMIA (ICD-272.0) - prev Rx'd by DrGegick... Now on ZOCOR 20mg /d (prev Crestor5 & Lescol from [facility]) ~  she brought labs from [facility] 5/10 (off med due to $$$) w/ TChol 285, TG 204, HDL 69, LDL 195... "he was mad at me" ~  FLP 5/11 from [facility] showed TChol 202, TG 60, HDL 112, LDL 99 ~  She is reminded (again) to come FASTING for f/u FLP... ~  [place] 5/13 on Simva20 showed TChol 191, TG 81, HDL 82, LDL 93  HYPOTHYROIDISM (ICD-244.9) - prev followed by DrGegick on SYNTHROID 140mcg- 1/2 daily... ~  pt brought labs from [facility] 5/10 w/ TSH= 2.90, FreeT4= 1.09 ~  labs 8/11 showed TSH= 1.87 ~  Labs 3/12 showed TSH= 2.81 ~  Labs 1/13 showed TSH= 1.65 ~  Labs 3/14 showed TSH= 4.28  GERD (ICD-530.81) - uses PRILOSEC 20mg /d... last EGD was 6/06 by DrPatterson showing 3cmHH, reflux...   DIVERTICULOSIS OF COLON (ICD-562.10) & COLONIC POLYPS (ICD-211.3) - last colonoscopy 6/06 was WNL- no abnormality seen...  RENAL INSUFFICIENCY (ICD-588.9) - tough balance betw renal perfusion & diuresis for edema... ~  labs 10/08 showed BUN= 27, Creat= 1.5 ~  labs 8/09 showed BUN= 44, Creat= 1.9 ~  labs 2/10 showed BUN 46, Creat= 1.7 ~  labs 7/10 showed BUN= 54, Creat= 2.0 ~  labs 11/10 showed BUN= 38, Creat= 1.8 ~  labs 8/11 showed BUN= 42, Creat= 2.1 ~  labs in ER 1/12 showed BUN=56, Creat=2.67, rec decr diuretics to Demadex20mg /d only. ~  Labs 3/12 showed improved renal function w/ BUN=31, Creat=1.4... ~  Labs 5/12 on Lasix80 showed BUN=45, Creat=1.8 ~  Labs 9/12 by DrDeveshwar showed BUN=33, Creat=1.47 ~  Labs 1/13 showed BUN=26, Creat=1.3, BNP=50... On Lasix 40mg  Qam. ~  Labs 5/13 showed BUN=28, Creat=1.2 ~  Labs 12/13 on Lasix80 showed BUN=30, Creat=1.5 ~   Labs 3/14 on Lasix80 showed BUN=31, Creat=1.5 ~  Labs 6/14 by DrDeveshwar on Lasix80 showed BUN=30, Cr=1.3 ~  Labs here 11/14 on Lasix80 showed BUN=31, Cr= 1.3  DEGENERATIVE JOINT DISEASE (ICD-715.90) - this is her chief complaint- s/p right TKR in 1999,  left TKR 3/10... on MOBIC 7.5mg  Prn & VICODIN Prn as well... followed by DrYates; and had right second toe amp by Arnette Norris 2010 for hammertoe + spurs... also had epidural steroid shot from [facility] for LBP (without benefit she says)... ~  5/13:  She saw DrDeveshwar for Rheum w/ shot in hip & sl improved...  VITAMIN D DEFICIENCY (ICD-268.9) ~  labs 8/09 showed Vit D level = 12... rec- start Vit D 50,000 u weekly... ~  labs 2/10 showed Vit D level = 30... rec- continue Vit D 50K weekly... ~  labs 7/10 showed Vit D level =  39... rec- change to 1000 u OTC daily. ~  labs 8/11 showed Vit D level = 38... continue same. ~  Labs 3/14 showed Vit D level = 43... Continue 1000u daily.  ANXIETY (ICD-300.00) - on ALPRAZOLAM 0.25mg Tid Prn... several deaths in the family... daughter who lives w/ her recently ill...  SHINGLES - she had right T9-10 shingles in 2011 & resolved w/ Valtrex, Pred, etc...   Past Surgical History  Procedure Laterality Date  . Cataract extraction    . Abdominal hysterectomy    . Breast biopsy      Benign  . Total knee arthroplasty  1999    right  . Total knee arthroplasty  11/2008    left - Dr Lorin Mercy  . Toe amputation  08/2009    Right second toe - Dr Beola Cord  . Ercp N/A 10/06/2013    Procedure: ENDOSCOPIC RETROGRADE CHOLANGIOPANCREATOGRAPHY (ERCP);  Surgeon: Ladene Artist, MD;  Location: [place];  Service: Endoscopy;  Laterality: N/A;    Outpatient Encounter Prescriptions as of 10/18/2013  Medication Sig  . acetaminophen (TYLENOL) 325 MG tablet Per bottle as needed  . allopurinol (ZYLOPRIM) 100 MG tablet Take 100 mg by mouth daily.    [name] ALPRAZolam (XANAX) 0.25 MG tablet TAKE 1/2 TO 1 TABLET 3 TIMES A DAY AS NEEDED  .  Ascorbic Acid (VITAMIN C) 500 MG tablet Take 500 mg by mouth daily.    [name] aspirin 81 MG tablet Take 81 mg by mouth daily.    . Cholecalciferol (VITAMIN D) 1000 UNITS capsule Take 1,000 Units by mouth daily.    . ciprofloxacin (CIPRO) 500 MG tablet Take 1 tablet (500 mg total) by mouth daily. Take for 7 days then stop.  [name] diltiazem (CARDIZEM CD) 180 MG 24 hr capsule TAKE 1 CAPSULE DAILY  . furosemide (LASIX) 40 MG tablet TAKE 2 TABLETS BY MOUTH ONCE EVERY MORNING  . levothyroxine (SYNTHROID, LEVOTHROID) 125 MCG tablet Take 1/2 tablet By mouth once daily  . omeprazole (PRILOSEC) 20 MG capsule Take 20 mg by mouth daily.    Vladimir Faster Glycol-Propyl Glycol (SYSTANE) 0.4-0.3 % SOLN One drop in each eye two times a day   . simvastatin (ZOCOR) 20 MG tablet TAKE 1 TAB BY MOUTH AT BEDTIME  . timolol (TIMOPTIC) 0.5 % ophthalmic solution Place 1 drop into both eyes 2 (two) times daily.    . traMADol (ULTRAM) 50 MG tablet Take 1 tablet (50 mg total) by mouth 3 (three) times daily as needed.  . triamcinolone (KENALOG) 0.025 % cream Apply 1 application topically Twice daily.    Allergies  Allergen Reactions  . Enablex [Darifenacin Hydrobromide Er]     Caused her throat and mouth to have bumps and feel like it was swelling  . Clarithromycin Swelling    REACTION: causes her mouth to swell  . Codeine Other (See Comments)    Makes pt nervous  . Morphine Nausea And Vomiting    REACTION: n/v  . Sulfonamide Derivatives Other (See Comments)    REACTION: unsure of reaction    Current Medications, Allergies, Past Medical History, Past Surgical History, Family History, and Social History were reviewed in [employer] record.    Review of Systems        See HPI - all other systems neg except as noted...  The patient complains of dyspnea on exertion, peripheral edema, muscle weakness, and difficulty walking.  The patient denies anorexia, fever, weight loss, weight gain, vision loss,  decreased hearing,  hoarseness, chest pain, syncope, prolonged cough, headaches, hemoptysis, abdominal pain, melena, hematochezia, severe indigestion/heartburn, hematuria, incontinence, genital sores, suspicious skin lesions, transient blindness, depression, unusual weight change, abnormal bleeding, enlarged lymph nodes, and angioedema.     Objective:   Physical Exam      WD, WN, 80 y/o WF in NAD... GENERAL:  Alert & oriented; pleasant & cooperative... HEENT:  [place]/AT, EOM- full, EACs-clear, TMs-wnl, NOSE-clear, THROAT-clear & wnl. NECK:  Supple w/ fairROM; no JVD; normal carotid impulses w/o bruits; no thyromegaly or nodules palpated; no lymphadenopathy. CHEST:  Clear, decr BS bilat w/o wheezing, rales, or rhonchi heard... HEART:  Regular Rhythm;  gr 1/6 SEM without rubs or gallops detected... ABDOMEN:  Soft & nontender; normal bowel sounds; no organomegaly or masses detected. EXT:  moderate arthritic changes; s/p bilat TKRs; mild varicose veins/ +venous insuffic/ 2+edema R>L Stasis dermatitis bilateral, wear thigh high TEDS  NEURO: no focal neuro deficits... DERM:  No lesions noted; no rash etc..[name]

## 2013-10-18 NOTE — ED Notes (Addendum)
Patient fell this evening, hit a shelf under her nose.  Patient denies any LOC, full recall of the incident.  Patient has laceration under nose, has skin tears on upper right forearm and bruising.  Patient denies any blood thinners, but she is on 81mg  ASA daily.  Patient is CAOx3 at this time.  She is also complaining of left knee pain after the fall.

## 2013-10-18 NOTE — Assessment & Plan Note (Signed)
S/p ERCP and unsuccessful perc drain complicated by SIRS   improved with abx and fluid resusiatation  Has follow up with surgeon next week.  Labs today .

## 2013-10-19 ENCOUNTER — Emergency Department (HOSPITAL_COMMUNITY): Payer: Medicare Other

## 2013-10-19 ENCOUNTER — Emergency Department (HOSPITAL_COMMUNITY)
Admission: EM | Admit: 2013-10-19 | Discharge: 2013-10-19 | Disposition: A | Discharge status: Home / Self Care | Payer: Medicare Other | Attending: Emergency Medicine | Admitting: Emergency Medicine

## 2013-10-19 DIAGNOSIS — S0181XA Laceration without foreign body of other part of head, initial encounter: Secondary | ICD-10-CM

## 2013-10-19 DIAGNOSIS — T148XXA Other injury of unspecified body region, initial encounter: Secondary | ICD-10-CM

## 2013-10-19 DIAGNOSIS — M25569 Pain in unspecified knee: Secondary | ICD-10-CM | POA: Diagnosis not present

## 2013-10-19 DIAGNOSIS — S8990XA Unspecified injury of unspecified lower leg, initial encounter: Secondary | ICD-10-CM | POA: Diagnosis not present

## 2013-10-19 DIAGNOSIS — S99919A Unspecified injury of unspecified ankle, initial encounter: Secondary | ICD-10-CM | POA: Diagnosis not present

## 2013-10-19 MED ORDER — OXYCODONE-ACETAMINOPHEN 5-325 MG PO TABS
1.0000 | ORAL_TABLET | Freq: Once | ORAL | Status: AC
  Administered 2013-10-19: 1 via ORAL
  Filled 2013-10-19: qty 1

## 2013-10-19 MED ORDER — TETANUS-DIPHTHERIA TOXOIDS TD 5-2 LFU IM INJ
0.5000 mL | INJECTION | Freq: Once | INTRAMUSCULAR | Status: AC
  Administered 2013-10-19: 0.5 mL via INTRAMUSCULAR
  Filled 2013-10-19: qty 0.5

## 2013-10-19 NOTE — ED Notes (Signed)
Patient transported to X-ray 

## 2013-10-19 NOTE — ED Notes (Signed)
MD at bedside. 

## 2013-10-19 NOTE — ED Provider Notes (Signed)
[name] S 1:30AM Pt discussed with Attending Physician, Dr. Cheri Guppy.  Will assist in pt care with laceration repair.   LACERATION REPAIR Performed by: Martie Lee Authorized by: Martie Lee Consent: Verbal consent obtained. Risks and benefits: risks, benefits and alternatives were discussed Consent given by: patient Patient identity confirmed: provided demographic data Prepped and Draped in normal sterile fashion Wound explored  Laceration Location: middle upper lip  Laceration Length: 2 cm  No Foreign Bodies seen or palpated  Anesthesia: local infiltration  Local anesthetic: lidocaine 2% with epinephrine  Anesthetic total: 2 ml  Irrigation method: syringe Amount of cleaning: standard  Skin closure: skin with 6-0 prolene  Number of sutures: 6   Technique: simple interrupted.   Patient tolerance: Patient tolerated the procedure well with no immediate complications.   Martie Lee, PA-C 10/19/13 0201

## 2013-10-19 NOTE — ED Provider Notes (Signed)
I directly supervised this laceration repair.   Elyn Peers, MD 10/19/13 [phone]

## 2013-10-19 NOTE — ED Provider Notes (Signed)
CSN: [id number]     Arrival date & time 10/18/13  2019 History   First MD Initiated Contact with Patient 10/19/13 0031     Chief Complaint  Patient presents with  . Fall  . Facial Laceration   (Consider location/radiation/quality/duration/timing/severity/associated sxs/prior Treatment) HPI This patient is a very pleasant 80 year old woman who presents after a mechanical fall. She tripped and fell and struck her face against a shelf. She denies LOC. She sustained a laceration to the area underneath her nose. She had self limited epistaxis. She also struck her left knee on the floor. She has mild aching left knee pain which is worse with movements of the knee and is nonradiating.   Last Td is unknown.   Past Medical History  Diagnosis Date  . Hypertension   . Hypothyroidism   . Glaucoma   . Acute bronchitis   . Palpitations   . Unspecified venous (peripheral) insufficiency   . Pure hypercholesterolemia   . GERD (gastroesophageal reflux disease)   . Diverticulosis of colon (without mention of hemorrhage)   . Benign neoplasm of colon   . Renal insufficiency   . DJD (degenerative joint disease)   . Vitamin D deficiency disease   . Anxiety state, unspecified   . CKD (chronic kidney disease), stage III 10/06/2013   Past Surgical History  Procedure Laterality Date  . Cataract extraction    . Abdominal hysterectomy    . Breast biopsy      Benign  . Total knee arthroplasty  1999    right  . Total knee arthroplasty  11/2008    left - Dr Lorin Mercy  . Toe amputation  08/2009    Right second toe - Dr Beola Cord  . Ercp N/A 10/06/2013    Procedure: ENDOSCOPIC RETROGRADE CHOLANGIOPANCREATOGRAPHY (ERCP);  Surgeon: Ladene Artist, MD;  Location: [place];  Service: Endoscopy;  Laterality: N/A;   Family History  Problem Relation Age of Onset  . Heart disease Mother   . Cancer Father     Throat  . Heart disease Father    History  Substance Use Topics  . Smoking status: Never Smoker   .  Smokeless tobacco: Never Used  . Alcohol Use: No   OB History   Grav Para Term Preterm Abortions TAB SAB Ect Mult Living                 Review of Systems Ten point review of symptoms performed and is negative with the exception of symptoms noted above.   Allergies  Enablex; Clarithromycin; Codeine; Morphine; and Sulfonamide derivatives  Home Medications   Current Outpatient Rx  Name  Route  Sig  Dispense  Refill  . acetaminophen (TYLENOL) 325 MG tablet   Oral   Take 325 mg by mouth every 4 (four) hours as needed for mild pain or moderate pain.          [name] allopurinol (ZYLOPRIM) 100 MG tablet   Oral   Take 100 mg by mouth daily.           [name] ALPRAZolam (XANAX) 0.25 MG tablet   Oral   Take 0.125-1 mg by mouth 3 (three) times daily as needed for anxiety.         . Ascorbic Acid (VITAMIN C) 500 MG tablet   Oral   Take 500 mg by mouth daily.           [name] aspirin 81 MG tablet   Oral   Take 81 mg by  mouth daily.           . Cholecalciferol (VITAMIN D) 1000 UNITS capsule   Oral   Take 1,000 Units by mouth daily.           [name] diltiazem (CARDIZEM CD) 180 MG 24 hr capsule      TAKE 1 CAPSULE DAILY   90 capsule   3   . furosemide (LASIX) 40 MG tablet      TAKE 2 TABLETS BY MOUTH ONCE EVERY MORNING   180 tablet   3   . levothyroxine (SYNTHROID, LEVOTHROID) 125 MCG tablet   Oral   Take 62.5 mcg by mouth daily before breakfast.         . omeprazole (PRILOSEC) 20 MG capsule   Oral   Take 20 mg by mouth daily.           Vladimir Faster Glycol-Propyl Glycol (SYSTANE) 0.4-0.3 % SOLN   Both Eyes   Place 1 drop into both eyes 2 (two) times daily.          . simvastatin (ZOCOR) 20 MG tablet      TAKE 1 TAB BY MOUTH AT BEDTIME   90 tablet   3   . timolol (TIMOPTIC) 0.5 % ophthalmic solution   Both Eyes   Place 1 drop into both eyes 2 (two) times daily.           . traMADol (ULTRAM) 50 MG tablet   Oral   Take 1 tablet (50 mg total) by mouth 3 (three)  times daily as needed.   90 tablet   5   . triamcinolone (KENALOG) 0.025 % cream   Topical   Apply 1 application topically 2 (two) times daily.           BP 140/63  Pulse 87  Temp(Src) 97.1 F (36.2 C) (Oral)  Resp 20  SpO2 96% Physical Exam Gen: well developed and well nourished appearing Head: NCAT Eyes: PERL, EOMI Nose: no epistaixis or rhinorrhea Face: 2cm linear laceration midline inferior to nose Mouth/throat: echymosis of the right upper lip, teeth intact and nonmobile, no intra oral lacerations identified. mucosa is moist and pink Neck: supple, no stridor Lungs: CTA B, no wheezing, rhonchi or rales CV: RRR, no murmur, extremities appear well perfused.  Abd: soft, notender, nondistended Back: no ttp, no cva ttp Skin: warm and dry Ext: echymosis with ttp over the anterior left knee. Normal ROM with mild discomfort. Extremities are otherwise nontender, there is chronic appearing vensous stasis of both legs with bilateral pretibial edema.  Neuro: CN ii-xii grossly intact, good strength in all 4 ext, normal speech.  Psyche; normal affect,  calm and cooperative.   ED Course  Procedures (including critical care time) Labs Review     DG Knee 2 Views Left (Final result)  Result time: 10/19/13 01:55:12    Final result by Rad Results In Interface (10/19/13 01:55:12)    Narrative:   CLINICAL DATA: Left knee pain after fall.  EXAM: LEFT KNEE - 1-2 VIEW  COMPARISON: Left knee radiograph December 05, 2008.  FINDINGS: No acute fracture deformity or dislocation. Status post total knee arthroplasty with intact well seated femoral and tibial components, expected appearance of the resurfaced patella. No destructive bony lesions. Soft tissue planes are nonsuspicious.  IMPRESSION: No acute fracture deformity or dislocation of the left knee.  Status post left knee total arthroplasty without radiographic findings of hardware failure.   Electronically Signed By: Elon Alas On: 10/19/2013  01:55     MDM  Patient is status post mechanical fall with laceration to the face which has been repaired by Collier Salina K. min, PA. She has a contusion of the left knee with no fracture and stable appearing arthroplasty hardware. Patient is stable for discharge with plan for outpatient followup and suture removal.   Elyn Peers, MD 10/19/13 [phone]

## 2013-10-19 NOTE — Progress Notes (Signed)
Quick Note:  Called spoke with patient, advised of lab results / recs as stated by TP. Pt verbalized her understanding and denied any questions.  **ov w/ TP scheduled for 2.2.15 @ 1130 for suture removal -- pt fell at home last night, received 6 sutures to the top lip. ______

## 2013-10-22 ENCOUNTER — Inpatient Hospital Stay: Payer: Medicare Other | Admitting: Pulmonary Disease

## 2013-10-25 ENCOUNTER — Encounter: Payer: Self-pay | Admitting: Adult Health

## 2013-10-25 ENCOUNTER — Ambulatory Visit (INDEPENDENT_AMBULATORY_CARE_PROVIDER_SITE_OTHER): Payer: Medicare Other | Admitting: Adult Health

## 2013-10-25 VITALS — BP 130/62 | HR 75 | Temp 97.3°F | Ht 64.0 in | Wt 146.2 lb

## 2013-10-25 DIAGNOSIS — S0180XA Unspecified open wound of other part of head, initial encounter: Secondary | ICD-10-CM

## 2013-10-25 DIAGNOSIS — S0181XA Laceration without foreign body of other part of head, initial encounter: Secondary | ICD-10-CM

## 2013-10-25 NOTE — Patient Instructions (Signed)
Wash areas gently and pat dry  Call if any sign of infection with redness , drainage or fever  follow up Dr. Lenna Gilford  As planned next month  Please contact office for sooner follow up if symptoms do not improve or worsen or seek emergency care

## 2013-10-26 ENCOUNTER — Ambulatory Visit (INDEPENDENT_AMBULATORY_CARE_PROVIDER_SITE_OTHER): Payer: Medicare Other | Admitting: Surgery

## 2013-10-26 ENCOUNTER — Encounter (INDEPENDENT_AMBULATORY_CARE_PROVIDER_SITE_OTHER): Payer: Self-pay | Admitting: Surgery

## 2013-10-26 VITALS — BP 130/60 | HR 76 | Temp 97.8°F | Resp 14 | Ht 64.0 in | Wt 146.4 lb

## 2013-10-26 DIAGNOSIS — K8050 Calculus of bile duct without cholangitis or cholecystitis without obstruction: Secondary | ICD-10-CM | POA: Diagnosis not present

## 2013-10-26 NOTE — Patient Instructions (Signed)
Go to hospital if abdominal pain returns or worsens.  Cholelithiasis Cholelithiasis (also called gallstones) is a form of gallbladder disease in which gallstones form in your gallbladder. The gallbladder is an organ that stores bile made in the liver, which helps digest fats. Gallstones begin as small crystals and slowly grow into stones. Gallstone pain occurs when the gallbladder spasms and a gallstone is blocking the duct. Pain can also occur when a stone passes out of the duct.  RISK FACTORS  Being female.   Having multiple pregnancies. Health care providers sometimes advise removing diseased gallbladders before future pregnancies.   Being obese.  Eating a diet heavy in fried foods and fat.   Being older than 80 years and increasing age.   Prolonged use of medicines containing female hormones.   Having diabetes mellitus.   Rapidly losing weight.   Having a family history of gallstones (heredity).  SYMPTOMS  Nausea.   Vomiting.  Abdominal pain.   Yellowing of the skin (jaundice).   Sudden pain. It may persist from several minutes to several hours.  Fever.   Tenderness to the touch. In some cases, when gallstones do not move into the bile duct, people have no pain or symptoms. These are called "silent" gallstones.  TREATMENT Silent gallstones do not need treatment. In severe cases, emergency surgery may be required. Options for treatment include:  Surgery to remove the gallbladder. This is the most common treatment.  Medicines. These do not always work and may take 6 12 months or more to work.  Shock wave treatment (extracorporeal biliary lithotripsy). In this treatment an ultrasound machine sends shock waves to the gallbladder to break gallstones into smaller pieces that can pass into the intestines or be dissolved by medicine. HOME CARE INSTRUCTIONS   Only take over-the-counter or prescription medicines for pain, discomfort, or fever as directed by your  health care provider.   Follow a low-fat diet until seen again by your health care provider. Fat causes the gallbladder to contract, which can result in pain.   Follow up with your health care provider as directed. Attacks are almost always recurrent and surgery is usually required for permanent treatment.  SEEK IMMEDIATE MEDICAL CARE IF:   Your pain increases and is not controlled by medicines.   You have a fever or persistent symptoms for more than 2 3 days.   You have a fever and your symptoms suddenly get worse.   You have persistent nausea and vomiting.  MAKE SURE YOU:   Understand these instructions.  Will watch your condition.  Will get help right away if you are not doing well or get worse. Document Released: 09/05/2005 Document Revised: 05/12/2013 Document Reviewed: 03/03/2013 [facility] Patient Information 2014 [employer].

## 2013-10-26 NOTE — Progress Notes (Signed)
Subjective:     Patient ID: [name], female   DOB: [birth year], 80 y.o.   MRN: [id number]  HPI  patient returns for followup of gallstones with history of choledocholithiasis status post ERCP 2 weeks ago. She was seen on D[facility].  She feels well. Denies abdominal pain. Tolerating diet.  Review of Systems  Respiratory: Negative.   Cardiovascular: Negative.   Gastrointestinal: Negative.        Objective:   Physical Exam  Constitutional: She appears well-developed and well-nourished.  Eyes: Pupils are equal, round, and reactive to light. No scleral icterus.  Abdominal: Soft. Bowel sounds are normal. She exhibits no distension and no mass. There is no tenderness. There is no rebound and no guarding.  Skin: Skin is warm and dry.       Assessment:     History of common bile duct stone and gallstones status post ERCP 2 weeks ago    Plan:     Given her advanced age would not recommend elective cholecystectomy at this point unless she has further problem. Discussed this with the patient and her daughter today. If she has recurrent issues, he may require laparoscopic cholecystectomy. I have given him instruction about what to look for and what to do at that time.

## 2013-10-28 NOTE — Progress Notes (Signed)
Subjective:    Patient ID: [name], female    DOB: Dec 19, 1914, 80 y.o.   MRN: [id number]  HPI 80 y/o WF here for a follow up visit... she has mult med problems including:  Hx asthmatic bronchitis;  HBP;  Ven Insuffic & edema;  Hyperchol;  Hypothyroid;  GERD/ Divertics/ Colon polyps;  Renal insuffic;  DJD;  Vit D defic;  Anxiety...    10/18/13 [facility] follow up  Patient returns for a one-week post hospital followup. The patient was admitted January 13 through January 19 for bilateral lower extremity cellulitis with right greater than left, Sirs, and cholelithiasis, with obstruction Patient was treated with aggressive IV antibiotics, IV fluid resuscitation, Abdominal ultrasound showed an acute cholecystitis, and obstructive 0.8 cm gallstone in the distal , mid duct. HIDA scan, also, consistent with cholecystitis Patient underwent an ERCP with sphincterectomy and stone extraction Patient also had an unsuccessful placement of a cholecystostomy drain. Patient was discharged on a seven-day course of Cipro. She has been set up with general surgery in a week for followup. May look at future cholecystectomy .  Since discharge. She is feeling improved, abdominal pain, has subsided.  No fever, chest pain, increased edema , bloody stools, or orthopnea.  Legs are some improved but swell every day >>labs    10/25/13 ER follow up  Had fall at home. Lost balance and hit face on shelf. No LOC. Hit knee, xray in ER w/ no acute findings.  She went to ER on 10/19/13 , suture placed.  Returns today to have sutures removed.  No increased pain, redness, drainage or fever.   Was seen last week for post hospital , says she is still doing well. Has ov with surgeon tomorrow.            Problem List:  GLAUCOMA (ICD-365.9) - hx mult eye problems w/ prev corneal transplant & glaucoma laser eye surg... on eye drops per Ophthalmology at [facility]...  ASTHMATIC BRONCHITIS, ACUTE (ICD-466.0) - Hx  AB requiring Rx w/ Depo/ Pred/ Mucinex/ Tussionex... ~  CXR 4/11 showed vague nodular densities on left... ~  8/11:  she went to an [facility] w/ cough, sputum, & dx w/ pneumonia rx w/ ZPak... ~  CXR 3/12 showed borderline cardiomeg, nodular opac left base, NAD... ~  Spring 2013: she notes some allergy symptoms...  HYPERTENSION (ICD-401.9) - controlled on ASA 81mg /d,  CARTIAXT 180mg /d, LASIX 40mg -  1-2Qam... ~  7/10:  labs showed BUN=54, Creat=2.0 & rec to decr diuretics in half= one Demedex & 1/2 Aldactone. ~  11/10:  labs showed BUN=38, Creat=1.8 & rec to keep same meds. ~  8/11:  labs showed BUN=42, Creat=2.1, K=4.3 ~  1/12:  labs in ER showed BUN=56, Creat=2.67, therefore diuretics decr to Demadex20/d only. ~  3/12:  2DEcho showed mod LVH, norm sys function w/ EF=60-65% & no regional wall motion abn; Gr1DD, mibile density on AoV- prob lambl'e escrescence, mildMR. ~  5/12:  f/u labs in office showed BUN=45, Creat=1.8, on Lasix 80mg  Qam for her edema... ~  9/12:  Labs by DrDeveshwar 9/12 showed BUN=33, Creat=1.47, edema resolved therefore decr Lasix to 40mg /d. ~  1/13:  BP= 160/76 but hasn't taken meds today; labs improved w/ BUN=26, Creat=1.3, BNP=50 ~  5/13:  BP= 140/78 & denies CP, palpit, SOB, edema; labs improved w/ BUN=28, Creat=1.2 ~  9/13:  BP= 140/60 & she denies CP, palpt, ch in SOB or edema... ~  12/13: controlled on Diltiazem180 & Furosemide  40mg -2Qam w/ BP=138/62 today & she denies CP, palpit, dizzy, ch inSOB, etc... ~  3/14:  BP= 130/66 on same meds; Labs stable as well w/ Cr=1.5, continue same... ~  7/14:  on Diltiazem180 & Furosemide 40mg -2Qam w/ BP=150/64 today & she denies CP, palpit, dizzy, ch in SOB, etc. ~  11/14: BP= 126/64 on same meds, stable...  PALPITATIONS, HX OF (ICD-V12.50)  VENOUS INSUFFICIENCY (ICD-459.81) - swelling controlled w/ diuretics, low sodium diet, elevation, support hose, etc... ~  She saw DrEarly [facility]- there was nothing he could do... ~  Swelling  diminished w/ low sodium, elevation, support hose, & Lasix40 1-2tabs daily... ~  Nov-Dec2013: she knows to elim sodium, elevate, wear support hose; she has been seen by [facility], DrEarly & there is nothing they can do; recently checked by [facility] Mccallum- DrJones w/ patch dressing & support hose; edema decr w/ Lasix 80mg /d; Lab today shows Creat=1.5.[name]. ~  7/14:  she knows to elim sodium, elevate, wear support hose, & the Lasix80; weight is down 10# today to 141# w/ resolution of edema; [name] (Derm) treated dermatitis w/ new cream & improved. ~  11/14: wt is back up 8# to 149# today & she needs to elim sodium, elev legs, etc...  HYPERCHOLESTEROLEMIA (ICD-272.0) - prev Rx'd by DrGegick... Now on ZOCOR 20mg /d (prev Crestor5 & Lescol from [facility]) ~  she brought labs from [facility] 5/10 (off med due to $$$) w/ TChol 285, TG 204, HDL 69, LDL 195... "he was mad at me" ~  FLP 5/11 from [facility] showed TChol 202, TG 60, HDL 112, LDL 99 ~  She is reminded (again) to come FASTING for f/u FLP... ~  [place] 5/13 on Simva20 showed TChol 191, TG 81, HDL 82, LDL 93  HYPOTHYROIDISM (ICD-244.9) - prev followed by DrGegick on SYNTHROID 116mcg- 1/2 daily... ~  pt brought labs from [facility] 5/10 w/ TSH= 2.90, FreeT4= 1.09 ~  labs 8/11 showed TSH= 1.87 ~  Labs 3/12 showed TSH= 2.81 ~  Labs 1/13 showed TSH= 1.65 ~  Labs 3/14 showed TSH= 4.28  GERD (ICD-530.81) - uses PRILOSEC 20mg /d... last EGD was 6/06 by DrPatterson showing 3cmHH, reflux...   DIVERTICULOSIS OF COLON (ICD-562.10) & COLONIC POLYPS (ICD-211.3) - last colonoscopy 6/06 was WNL- no abnormality seen...  RENAL INSUFFICIENCY (ICD-588.9) - tough balance betw renal perfusion & diuresis for edema... ~  labs 10/08 showed BUN= 27, Creat= 1.5 ~  labs 8/09 showed BUN= 44, Creat= 1.9 ~  labs 2/10 showed BUN 46, Creat= 1.7 ~  labs 7/10 showed BUN= 54, Creat= 2.0 ~  labs 11/10 showed BUN= 38, Creat= 1.8 ~  labs 8/11 showed BUN= 42, Creat= 2.1 ~  labs in ER 1/12 showed BUN=56,  Creat=2.67, rec decr diuretics to Demadex20mg /d only. ~  Labs 3/12 showed improved renal function w/ BUN=31, Creat=1.4... ~  Labs 5/12 on Lasix80 showed BUN=45, Creat=1.8 ~  Labs 9/12 by DrDeveshwar showed BUN=33, Creat=1.47 ~  Labs 1/13 showed BUN=26, Creat=1.3, BNP=50... On Lasix 40mg  Qam. ~  Labs 5/13 showed BUN=28, Creat=1.2 ~  Labs 12/13 on Lasix80 showed BUN=30, Creat=1.5 ~  Labs 3/14 on Lasix80 showed BUN=31, Creat=1.5 ~  Labs 6/14 by DrDeveshwar on Lasix80 showed BUN=30, Cr=1.3 ~  Labs here 11/14 on Lasix80 showed BUN=31, Cr= 1.3  DEGENERATIVE JOINT DISEASE (ICD-715.90) - this is her chief complaint- s/p right TKR in 1999,  left TKR 3/10... on MOBIC 7.5mg  Prn & VICODIN Prn as well... followed by DrYates; and had right second toe amp by Arnette Norris 2010  for hammertoe + spurs... also had epidural steroid shot from [facility] for LBP (without benefit she says)... ~  5/13:  She saw DrDeveshwar for Rheum w/ shot in hip & sl improved...  VITAMIN D DEFICIENCY (ICD-268.9) ~  labs 8/09 showed Vit D level = 12... rec- start Vit D 50,000 u weekly... ~  labs 2/10 showed Vit D level = 30... rec- continue Vit D 50K weekly... ~  labs 7/10 showed Vit D level = 39... rec- change to 1000 u OTC daily. ~  labs 8/11 showed Vit D level = 38... continue same. ~  Labs 3/14 showed Vit D level = 43... Continue 1000u daily.  ANXIETY (ICD-300.00) - on ALPRAZOLAM 0.25mg Tid Prn... several deaths in the family... daughter who lives w/ her recently ill...  SHINGLES - she had right T9-10 shingles in 2011 & resolved w/ Valtrex, Pred, etc...   Past Surgical History  Procedure Laterality Date  . Cataract extraction    . Abdominal hysterectomy    . Breast biopsy      Benign  . Total knee arthroplasty  1999    right  . Total knee arthroplasty  11/2008    left - Dr Lorin Mercy  . Toe amputation  08/2009    Right second toe - Dr Beola Cord  . Ercp N/A 10/06/2013    Procedure: ENDOSCOPIC RETROGRADE CHOLANGIOPANCREATOGRAPHY  (ERCP);  Surgeon: Ladene Artist, MD;  Location: [place];  Service: Endoscopy;  Laterality: N/A;    Outpatient Encounter Prescriptions as of 10/25/2013  Medication Sig  . acetaminophen (TYLENOL) 325 MG tablet Take 325 mg by mouth every 4 (four) hours as needed for mild pain or moderate pain.   [name] allopurinol (ZYLOPRIM) 100 MG tablet Take 100 mg by mouth daily.    [name] ALPRAZolam (XANAX) 0.25 MG tablet Take 0.125-1 mg by mouth 3 (three) times daily as needed for anxiety.  . Ascorbic Acid (VITAMIN C) 500 MG tablet Take 500 mg by mouth daily.    [name] aspirin 81 MG tablet Take 81 mg by mouth daily.    . Cholecalciferol (VITAMIN D) 1000 UNITS capsule Take 1,000 Units by mouth daily.    [name] diltiazem (CARDIZEM CD) 180 MG 24 hr capsule TAKE 1 CAPSULE DAILY  . furosemide (LASIX) 40 MG tablet TAKE 2 TABLETS BY MOUTH ONCE EVERY MORNING  . levothyroxine (SYNTHROID, LEVOTHROID) 125 MCG tablet Take 62.5 mcg by mouth daily before breakfast.  . omeprazole (PRILOSEC) 20 MG capsule Take 20 mg by mouth daily.    Vladimir Faster Glycol-Propyl Glycol (SYSTANE) 0.4-0.3 % SOLN Place 1 drop into both eyes 2 (two) times daily.   . simvastatin (ZOCOR) 20 MG tablet TAKE 1 TAB BY MOUTH AT BEDTIME  . timolol (TIMOPTIC) 0.5 % ophthalmic solution Place 1 drop into both eyes 2 (two) times daily.    . traMADol (ULTRAM) 50 MG tablet Take 1 tablet (50 mg total) by mouth 3 (three) times daily as needed.  . triamcinolone (KENALOG) 0.025 % cream Apply 1 application topically 2 (two) times daily.     Allergies  Allergen Reactions  . Enablex [Darifenacin Hydrobromide Er]     Caused her throat and mouth to have bumps and feel like it was swelling  . Clarithromycin Swelling    REACTION: causes her mouth to swell  . Codeine Other (See Comments)    Makes pt nervous  . Morphine Nausea And Vomiting    REACTION: n/v  . Sulfonamide Derivatives Other (See Comments)    REACTION: unsure of  reaction    Current Medications, Allergies, Past Medical  History, Past Surgical History, Family History, and Social History were reviewed in [employer] record.    Review of Systems        See HPI - all other systems neg except as noted...  The patient complains of dyspnea on exertion, peripheral edema, muscle weakness, and difficulty walking.  The patient denies anorexia, fever, weight loss, weight gain, vision loss, decreased hearing, hoarseness, chest pain, syncope, prolonged cough, headaches, hemoptysis, abdominal pain, melena, hematochezia, severe indigestion/heartburn, hematuria, incontinence, genital sores, suspicious skin lesions, transient blindness, depression, unusual weight change, abnormal bleeding, enlarged lymph nodes, and angioedema.     Objective:   Physical Exam      WD, WN, 80 y/o WF in NAD... Wt Readings from Last 3 Encounters:  10/26/13 146 lb 6.4 oz (66.407 kg)  10/25/13 146 lb 3.2 oz (66.316 kg)  10/11/13 148 lb 13 oz (67.5 kg)   GENERAL:  Alert & oriented; pleasant & cooperative... HEENT:  [place]/AT, EOM- full, EACs-clear, TMs-wnl, NOSE-clear, THROAT-clear & wnl. Facial sutures -see derm below   NEURO: no focal neuro deficits noted  DERM:  Well approximated lac along midline under nose w/ sutures placed. No redness or drainage.   Procedure : 6 sutures removed without difficulty.

## 2013-10-28 NOTE — Assessment & Plan Note (Signed)
Facial laceration healed nicely  Sutures removed without difficulty.  Wound care instructions reviewed  Advised on signs of infection , to report immediately.

## 2013-11-03 DIAGNOSIS — L408 Other psoriasis: Secondary | ICD-10-CM | POA: Diagnosis not present

## 2013-11-03 DIAGNOSIS — I8310 Varicose veins of unspecified lower extremity with inflammation: Secondary | ICD-10-CM | POA: Diagnosis not present

## 2013-11-03 DIAGNOSIS — K130 Diseases of lips: Secondary | ICD-10-CM | POA: Diagnosis not present

## 2013-11-03 DIAGNOSIS — Z85828 Personal history of other malignant neoplasm of skin: Secondary | ICD-10-CM | POA: Diagnosis not present

## 2013-11-15 DIAGNOSIS — L408 Other psoriasis: Secondary | ICD-10-CM | POA: Diagnosis not present

## 2013-11-15 DIAGNOSIS — K130 Diseases of lips: Secondary | ICD-10-CM | POA: Diagnosis not present

## 2013-11-15 DIAGNOSIS — I8310 Varicose veins of unspecified lower extremity with inflammation: Secondary | ICD-10-CM | POA: Diagnosis not present

## 2013-11-15 DIAGNOSIS — Z85828 Personal history of other malignant neoplasm of skin: Secondary | ICD-10-CM | POA: Diagnosis not present

## 2013-11-24 DIAGNOSIS — H409 Unspecified glaucoma: Secondary | ICD-10-CM | POA: Diagnosis not present

## 2013-11-24 DIAGNOSIS — H4011X0 Primary open-angle glaucoma, stage unspecified: Secondary | ICD-10-CM | POA: Diagnosis not present

## 2013-12-06 DIAGNOSIS — I8310 Varicose veins of unspecified lower extremity with inflammation: Secondary | ICD-10-CM | POA: Diagnosis not present

## 2013-12-06 DIAGNOSIS — L408 Other psoriasis: Secondary | ICD-10-CM | POA: Diagnosis not present

## 2013-12-06 DIAGNOSIS — B372 Candidiasis of skin and nail: Secondary | ICD-10-CM | POA: Diagnosis not present

## 2013-12-06 DIAGNOSIS — Z85828 Personal history of other malignant neoplasm of skin: Secondary | ICD-10-CM | POA: Diagnosis not present

## 2013-12-15 ENCOUNTER — Encounter: Payer: Self-pay | Admitting: Pulmonary Disease

## 2013-12-15 ENCOUNTER — Ambulatory Visit (INDEPENDENT_AMBULATORY_CARE_PROVIDER_SITE_OTHER): Payer: Medicare Other | Admitting: Pulmonary Disease

## 2013-12-15 VITALS — BP 118/70 | HR 79 | Temp 97.4°F | Ht 60.0 in | Wt 136.6 lb

## 2013-12-15 DIAGNOSIS — N183 Chronic kidney disease, stage 3 unspecified: Secondary | ICD-10-CM

## 2013-12-15 DIAGNOSIS — M549 Dorsalgia, unspecified: Secondary | ICD-10-CM

## 2013-12-15 DIAGNOSIS — M1990 Unspecified osteoarthritis, unspecified site: Secondary | ICD-10-CM

## 2013-12-15 DIAGNOSIS — I359 Nonrheumatic aortic valve disorder, unspecified: Secondary | ICD-10-CM

## 2013-12-15 DIAGNOSIS — E7800 Pure hypercholesterolemia, unspecified: Secondary | ICD-10-CM

## 2013-12-15 DIAGNOSIS — E039 Hypothyroidism, unspecified: Secondary | ICD-10-CM

## 2013-12-15 DIAGNOSIS — F411 Generalized anxiety disorder: Secondary | ICD-10-CM

## 2013-12-15 DIAGNOSIS — E559 Vitamin D deficiency, unspecified: Secondary | ICD-10-CM

## 2013-12-15 DIAGNOSIS — R609 Edema, unspecified: Secondary | ICD-10-CM

## 2013-12-15 DIAGNOSIS — I872 Venous insufficiency (chronic) (peripheral): Secondary | ICD-10-CM

## 2013-12-15 DIAGNOSIS — K219 Gastro-esophageal reflux disease without esophagitis: Secondary | ICD-10-CM

## 2013-12-15 DIAGNOSIS — K5730 Diverticulosis of large intestine without perforation or abscess without bleeding: Secondary | ICD-10-CM

## 2013-12-15 DIAGNOSIS — I10 Essential (primary) hypertension: Secondary | ICD-10-CM

## 2013-12-15 DIAGNOSIS — D126 Benign neoplasm of colon, unspecified: Secondary | ICD-10-CM

## 2013-12-15 DIAGNOSIS — N259 Disorder resulting from impaired renal tubular function, unspecified: Secondary | ICD-10-CM

## 2013-12-15 DIAGNOSIS — K8050 Calculus of bile duct without cholangitis or cholecystitis without obstruction: Secondary | ICD-10-CM

## 2013-12-15 MED ORDER — HYDROXYZINE HCL 10 MG PO TABS: ORAL_TABLET | ORAL | Status: DC

## 2013-12-15 NOTE — Progress Notes (Signed)
Subjective:    Patient ID: [name], female    DOB: [birth year], 80 y.o.   MRN: [id number]  HPI 80 y/o WF here for a follow up visit... she has mult med problems including:  Hx asthmatic bronchitis;  HBP;  Ven Insuffic & edema;  Hyperchol;  Hypothyroid;  GERD/ Divertics/ Colon polyps;  Renal insuffic;  DJD;  Vit D defic;  Anxiety...   ~  May 22,2013:  60mo ROV & [name] is feeling well, no new complaints or concerns;  Her main prob= arthritis esp hands & bilat hips- she is followed by DrDeveshwar & seen recently w/ shot in painful right hip; prev had ESI in back from DrYates; prev Uric= 6.0 on Allopurinol, Colchicine, Vicodin... We reviewed her prob list, meds, xrays and labs> see below>>  LABS 5/13:  FLP- at goals w/ good HDL on Simva20;  Chems- wnl;  CBC- ok w/ Hg=12.4.[name].  ~  June 17, 2012:  72mo ROV & she remains stable> had some vertigo- improved w/ Antivert prn; she is c/o some incr pain in buttock, right hip & down right leg; hx prev ESI from DrYates in past- she has Vicodin for prn use & it helps some; she wants to wait for f/u appt DrDeveshwar before deciding what to do...  Notes that her breathing is OK "I'm on the go all the time";  BP= 140/60 & denies CP, palpit, ch in SOB or edema...     We reviewed prob list, meds, xrays and labs> see below for updates >> OK Flu shot today...  ~  September 21, 2012:  40mo ROV & Fumiye saw [place] 11/13 w/ c/o VI & incr edema> both legs were red, warm, tender- but she had no f/c/s or drainage; she had had foot surg w/ decr mobility & was on reg dose of Lasix40;  VenDopplers were neg for DVT & she was treated w/ DoxyBid for 10d- no improvement; she went to see Derm- DrJordan/ Jones & they changed her support hose "he found an ulcer" she says & treating w/ [employer] brand "hydrocolloid adhesive patch" Q2d; we also incr the Lasix to 80mg /d for the last month & she reports improved w/ decr redness, no drainage, decr edema "I can now fit into my shoes" she  says...     AB> stable, no recent exac, no regular meds required & she uses the Mucinex as needed...    HBP> controlled on Diltiazem180 & Furosemide 40mg -2Qam w/ BP=138/62 today & she denies CP, palpit, dizzy, ch inSOB, etc...    VI, Edema> she knows to elim sodium, elevate, wear support hose; she has been seen by [facility], DrEarly & there is nothing they can do; recently checked by Payton Mccallum- DrJones w/ patch dressing & support hose; edema decr w/ Lasix 80mg /d; Lab today shows Creat=1.5.Marland KitchenMarland Kitchen    CHOL> prev followed by DrGegick; on Simva20 Qhs + low fat diet; last FLP 5/13 showed TChol 191, TG 81, HDL 82, LDL 93    Hypothy> stable on Synthroid 162mcg- 1/2 tab daily; labs 1/13 showed TSH= 1.65     Renal Insuffic> Creat had prev increased to 1.8 on the Lasix 80mg ; repeat labs 9/12 by DrDeveshwar showed BUN=33, Creat=1.47, assoc w/ resolved edema therefore decr Lasix to 40mg - 1to2 Qam; Lab today shows BUN=30, Creat=1.5 on the Lasix80/d at present...    DJD> followed by Ladene Artist on Vicodin prn, Allopurinol100- now 1.5tabs/d (Uric=5.9), & off Colcrys...     Anxiety> on Alpraz0.25mg  prn... 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  LABS 12/13:  Chems= ok w/ BUN=30, Creat=1.5..[name]  ~  December 21, 2012:  346moOV & [name] [facility] the shield around it had an infection);  she has been on Lasix40-2tabsQam for the last 34m441moher wt is up 3# to 151# today;  BP is controlled w/ this & CardizemCD180, BP= 130/66;  Extremities show 2-3+ pitting in legs but chest is clear etc;  There is not a lot of wiggle room on her diuretics betw edema & renal insuffic- we decided to check labs & continue same Rx for now...    We reviewed prob list, meds, xrays and labs> see below for updates >>  LABS 3/14:  Chems stable w/ BUN=31, Creat=1.5, LFTs=wnl;  CBC- wnl;  TSH=4.28;  VitD=43... rec to  continue same meds.  ~  April 21, 2013:  41mo38mo  & [name] is actually much improved> weight down 10# w/ resolution of her edema & feeling better overall; c/o incr nasal congestion & drainage- we discussed OTC antihist Rx... We reviewed the following medical problems during today's office visit >>     Ophthalmology> followed at [facility] & hx corneal transplant w/ infection; she is on some new drops & doing satis she says...    AB> stable, no recent exac, no regular meds required & she uses the Mucinex as needed; she is c/o a runny nose & we discussed Antihist- Claritin, Zyrtek, Allegra as needed...    HBP> on Diltiazem180 & Furosemide 40mg38mm w/ BP=150/64 today & she denies CP, palpit, dizzy, ch in SOB, etc...    VI, Hx Edema> she knows to elim sodium, elevate, wear support hose, & the Lasix80; weight is down 10# today to 141# w/ resolution of edema; [name] (Derm) treated dermatitis w/ new cream & improved...     CHOL> prev followed by DrGegick; on Simva20 Qhs + low fat diet; last FLP 5/13 showed TChol 191, TG 81, HDL 82, LDL 93    Hypothy> on Synthroid 125mcg38m2 tab daily; labs 3/14 showed TSH= 4.28    Renal Insuffic> Creat had prev up to 1.8 on the Lasix 80mg; 12m1/14 showed BUN=30, Creat=1.5 on the Lasix80/d; repeat labs 6/14 by DrDeveshwar showed Cr=1.3    DJD, Gout> followed by DrDeveshwar on Vicodin prn, ?Allopurinol200- 1.5tabs/d (labs 6/14 Uric=4.5) per Rheum, & off Colcrys...    Anxiety> on Alpraz0.25mg pr65m We reviewed prob list, meds, xrays and labs> see below for updates >>   LABS 6/14 by DrDeveshwar:  Chems- wnl x Cr=1.3;  CBC- wnl w/ Hg=12.5;  Uric=4.5...  ~  NMarland Kitchenvember 24, 2014:  41mo ROV 55morginia is c/o back & leg pain, thinks she wants another epidural shot & says she will sched this herself; she is not using the Hydrocodone, just Tylenol & we discussed trial Tramadol in addition... She is also c/o dermatitis in legs- has seen DrJordan for this & will f/u w/ them  as well...     HBP> on Diltiazem180 & Furosemide 40mg-2Qam734mBP=126/64 today & she denies CP, palpit, dizzy, ch in SOB, etc...    VI, Hx Edema> she knows to elim sodium, elevate, wear support hose, & the Lasix80; weight is back up 8# today to 149# w/ mild edema; [name] (Derm) prev treated dermatitis w/ new cream & it  improved...     CHOL> on Simva20 Qhs + low fat diet; last FLP 5/13 showed TChol 191, TG 81, HDL 82, LDL 93... Needs to ret fasting for f/u blood work.    Hypothy> on Synthroid 153mcg- 1/2 tab daily; labs 3/14 showed TSH= 4.28    Renal Insuffic> Creat had prev up to 1.8 on the Lasix $RemoveB'80mg'kqUDGVKU$ ; Lab 1/14 showed BUN=30, Creat=1.5 on the Lasix80/d; repeat labs 6/14 by DrDeveshwar showed Cr=1.3 & it is 1.3 today... We reviewed prob list, meds, xrays and labs> see below for updates >> ok 2014 Flu vacine today...   LABS 11/14:  Chems- ok w/ BS=108, Cr=1.3;  Uric=4.3 on Allopurinol100;  CBC- ok w/ Hg=12.6...  ~  December 15, 2013:  64mo ROV & [place] was [facility] 1/13 - 10/11/13 w/ RUQ pain & dx w/ gallstones, cholecystitis, & had ERCP w/ sphincterotomy (DrStark) & stone extraction; treated w/ Cipro, LFTs improved, course complicated by LE cellulitis requiring addition of Vanco IV; she had f/u DrCornett, [facility] 2/15> brief note, did not rec elective surg given her age... We reviewed the following medical problems during today's office visit >>     HBP> on Diltiazem180 & Furosemide $RemoveBefo'40mg'tNuHjYRgPBf$ -2Qam w/ BP=118/70 today & she denies CP, palpit, dizzy, ch in SOB, etc...    VI, Hx Edema> she knows to elim sodium, elevate, wear support hose, & the Lasix80; weight is back down 9# today to 137#; treated in hosp 1/15 for cellulitis...    CHOL> on Simva20 Qhs + low fat diet; last FLP 5/13 showed TChol 191, TG 81, HDL 82, LDL 93... Needs to ret fasting for f/u blood work.    Hypothy> on Synthroid 139mcg- 1/2 tab daily; labs 3/14 showed TSH= 4.28    Renal Insuffic> Creat prev up to 1.8 on the Lasix $RemoveB'80mg'RzpVBPrQ$ ; 1/15 showed Cr betw  1.1-1.7    Derm> she has Psoriasis on her arms & has been evaluated by DrJordan/Jones; they also treated the LE cellulitis; Rec Atarax prn itching...    Anemia> Hosp 1/15 w/ gallstones and cholecystits; Hg= 7.5-9.4 We reviewed prob list, meds, xrays and labs> see below for updates >>            Problem List:  GLAUCOMA (ICD-365.9) - hx mult eye problems w/ prev corneal transplant & glaucoma laser eye surg... on eye drops per Ophthalmology at [facility]...  ASTHMATIC BRONCHITIS, ACUTE (ICD-466.0) - Hx AB requiring Rx w/ Depo/ Pred/ Mucinex/ Tussionex... ~  CXR 4/11 showed vague nodular densities on left... ~  8/11:  she went to an [facility] w/ cough, sputum, & dx w/ pneumonia rx w/ ZPak... ~  CXR 3/12 showed borderline cardiomeg, nodular opac left base, NAD... ~  Spring 2013: she notes some allergy symptoms... ~  CXR 1/15 showed mild cardiomeg, atherosclerotic & tortuous Ao, low lung vol & sl incr markings, NAD...   HYPERTENSION (ICD-401.9) - controlled on ASA $Remo'81mg'KxFQA$ /d,  CARTIAXT $RemoveB'180mg'nXEVFvzh$ /d, LASIX $RemoveB'40mg'ZVJVYouH$ -  1-2Qam... ~  7/10:  labs showed BUN=54, Creat=2.0 & rec to decr diuretics in half= one Demedex & 1/2 Aldactone. ~  11/10:  labs showed BUN=38, Creat=1.8 & rec to keep same meds. ~  8/11:  labs showed BUN=42, Creat=2.1, K=4.3 ~  1/12:  labs in ER showed BUN=56, Creat=2.67, therefore diuretics decr to Demadex20/d only. ~  3/12:  2DEcho showed mod LVH, norm sys function w/ EF=60-65% & no regional wall motion abn; Gr1DD, mibile density on AoV- prob lambl'e escrescence, mildMR. ~  5/12:  f/u labs in  office showed BUN=45, Creat=1.8, on Lasix $Remove'80mg'bOSPNXf$  Qam for her edema... ~  9/12:  Labs by DrDeveshwar 9/12 showed BUN=33, Creat=1.47, edema resolved therefore decr Lasix to $Remove'40mg'pQCmTxl$ /d. ~  1/13:  BP= 160/76 but hasn't taken meds today; labs improved w/ BUN=26, Creat=1.3, BNP=50 ~  5/13:  BP= 140/78 & denies CP, palpit, SOB, edema; labs improved w/ BUN=28, Creat=1.2 ~  9/13:  BP= 140/60 & she denies CP, palpt, ch in  SOB or edema... ~  12/13: controlled on Diltiazem180 & Furosemide $RemoveBefo'40mg'OvNVCrZokQZ$ -2Qam w/ BP=138/62 today & she denies CP, palpit, dizzy, ch inSOB, etc... ~  3/14:  BP= 130/66 on same meds; Labs stable as well w/ Cr=1.5, continue same... ~  7/14: on Diltiazem180 & Furosemide $RemoveBefo'40mg'xqpfNpSkJDN$ -2Qam w/ BP=150/64 today & she denies CP, palpit, dizzy, ch in SOB, etc. ~  11/14: BP= 126/64 on same meds, stable... ~  EKG 1/15 showed NSR, rate92, NSSTTWA, NAD... ~  3/15: on Diltiazem180 & Furosemide $RemoveBefo'40mg'DxRemRhcgAT$ -2Qam w/ BP=118/70 today & she denies CP, palpit, dizzy, ch in SOB, etc.  PALPITATIONS, HX OF (ICD-V12.50)  VENOUS INSUFFICIENCY (ICD-459.81) - swelling controlled w/ diuretics, low sodium diet, elevation, support hose, etc... ~  She saw DrEarly [facility]- there was nothing he could do... ~  Swelling diminished w/ low sodium, elevation, support hose, & Lasix40 1-2tabs daily... ~  Nov-Dec2013: she knows to elim sodium, elevate, wear support hose; she has been seen by [facility], DrEarly & there is nothing they can do; recently checked by Payton Mccallum- DrJones w/ patch dressing & support hose; edema decr w/ Lasix $Remove'80mg'OwEotDR$ /d; Lab today shows Creat=1.5.[name]. ~  7/14:  she knows to elim sodium, elevate, wear support hose, & the Lasix80; weight is down 10# today to 141# w/ resolution of edema; [name] (Derm) treated dermatitis w/ new cream & improved. ~  11/14: wt is back up 8# to 149# today & she needs to elim sodium, elev legs, etc... ~  3/15: she knows to elim sodium, elevate, wear support hose, & the Lasix80; weight is back down 9# today to 137#...  HYPERCHOLESTEROLEMIA (ICD-272.0) - prev Rx'd by DrGegick... Now on ZOCOR $Remove'20mg'JstIkYX$ /d (prev Crestor5 & Lescol from [facility]) ~  she brought labs from [facility] 5/10 (off med due to $$$) w/ TChol 285, TG 204, HDL 69, LDL 195... "he was mad at me" ~  FLP 5/11 from [facility] showed TChol 202, TG 60, HDL 112, LDL 99 ~  She is reminded (again) to come FASTING for f/u FLP... ~  [place] 5/13 on Simva20 showed TChol 191, TG 81, HDL  82, LDL 93  HYPOTHYROIDISM (ICD-244.9) - prev followed by DrGegick on SYNTHROID 185mcg- 1/2 daily... ~  pt brought labs from [facility] 5/10 w/ TSH= 2.90, FreeT4= 1.09 ~  labs 8/11 showed TSH= 1.87 ~  Labs 3/12 showed TSH= 2.81 ~  Labs 1/13 showed TSH= 1.65 ~  Labs 3/14 showed TSH= 4.28  GERD (ICD-530.81) - uses PRILOSEC $RemoveBefor'20mg'ztJBzgZGUkOM$ /d... last EGD was 6/06 by DrPatterson showing 3cmHH, reflux...   DIVERTICULOSIS OF COLON (ICD-562.10) & COLONIC POLYPS (ICD-211.3) - last colonoscopy 6/06 was WNL- no abnormality seen...  GALLSTONE and CHOLECYSTITIS >>  ~  1/15: [name] was [facility] 1/13 - 10/11/13 w/ RUQ pain & dx w/ gallstones, cholecystitis, & had ERCP w/ sphincterotomy (DrStark) & stone extraction; treated w/ Cipro, LFTs improved, course complicated by LE cellulitis requiring addition of Vanco IV; she had f/u DrCornett, [facility] 2/15> brief note, did not rec elective surg given her age.  RENAL INSUFFICIENCY (ICD-588.9) - tough balance betw renal perfusion &  diuresis for edema... ~  labs 10/08 showed BUN= 27, Creat= 1.5 ~  labs 8/09 showed BUN= 44, Creat= 1.9 ~  labs 2/10 showed BUN 46, Creat= 1.7 ~  labs 7/10 showed BUN= 54, Creat= 2.0 ~  labs 11/10 showed BUN= 38, Creat= 1.8 ~  labs 8/11 showed BUN= 42, Creat= 2.1 ~  labs in ER 1/12 showed BUN=56, Creat=2.67, rec decr diuretics to Demadex20mg /d only. ~  Labs 3/12 showed improved renal function w/ BUN=31, Creat=1.4... ~  Labs 5/12 on Lasix80 showed BUN=45, Creat=1.8 ~  Labs 9/12 by DrDeveshwar showed BUN=33, Creat=1.47 ~  Labs 1/13 showed BUN=26, Creat=1.3, BNP=50... On Lasix 40mg  Qam. ~  Labs 5/13 showed BUN=28, Creat=1.2 ~  Labs 12/13 on Lasix80 showed BUN=30, Creat=1.5 ~  Labs 3/14 on Lasix80 showed BUN=31, Creat=1.5 ~  Labs 6/14 by DrDeveshwar on Lasix80 showed BUN=30, Cr=1.3 ~  Labs here 11/14 on Lasix80 showed BUN=31, Cr= 1.3 ~  She was [facility] 1/15 w/ cholecystitis & stones, s/p ERCP, and Creat- 1.1 - 1.7  DEGENERATIVE JOINT DISEASE (ICD-715.90) -  this is her chief complaint- s/p right TKR in 1999,  left TKR 3/10... on MOBIC 7.5mg  Prn & VICODIN Prn as well... followed by DrYates; and had right second toe amp by Arnette Norris 2010 for hammertoe + spurs... also had epidural steroid shot from [facility] for LBP (without benefit she says)... ~  5/13:  She saw DrDeveshwar for Rheum w/ shot in hip & sl improved...  VITAMIN D DEFICIENCY (ICD-268.9) ~  labs 8/09 showed Vit D level = 12... rec- start Vit D 50,000 u weekly... ~  labs 2/10 showed Vit D level = 30... rec- continue Vit D 50K weekly... ~  labs 7/10 showed Vit D level = 39... rec- change to 1000 u OTC daily. ~  labs 8/11 showed Vit D level = 38... continue same. ~  Labs 3/14 showed Vit D level = 43... Continue 1000u daily.  ANXIETY (ICD-300.00) - on ALPRAZOLAM 0.25mg Tid Prn... several deaths in the family... daughter who lives w/ her recently ill...  SHINGLES - she had right T9-10 shingles in 2011 & resolved w/ Valtrex, Pred, etc...   Past Surgical History  Procedure Laterality Date  . Cataract extraction    . Abdominal hysterectomy    . Breast biopsy      Benign  . Total knee arthroplasty  1999    right  . Total knee arthroplasty  11/2008    left - Dr Lorin Mercy  . Toe amputation  08/2009    Right second toe - Dr Beola Cord  . Ercp N/A 10/06/2013    Procedure: ENDOSCOPIC RETROGRADE CHOLANGIOPANCREATOGRAPHY (ERCP);  Surgeon: Ladene Artist, MD;  Location: [facility];  Service: Endoscopy;  Laterality: N/A;    Outpatient Encounter Prescriptions as of 12/15/2013  Medication Sig  . acetaminophen (TYLENOL) 325 MG tablet Take 325 mg by mouth every 4 (four) hours as needed for mild pain or moderate pain.   [name] ALPRAZolam (XANAX) 0.25 MG tablet Take 0.125-1 mg by mouth 3 (three) times daily as needed for anxiety.  . Ascorbic Acid (VITAMIN C) 500 MG tablet Take 500 mg by mouth daily.    [name] aspirin 81 MG tablet Take 81 mg by mouth daily.    . Cholecalciferol (VITAMIN D) 1000 UNITS capsule Take 1,000  Units by mouth daily.    [name] diltiazem (CARDIZEM CD) 180 MG 24 hr capsule TAKE 1 CAPSULE DAILY  . furosemide (LASIX) 40 MG tablet TAKE 2 TABLETS BY MOUTH ONCE  EVERY MORNING  . levothyroxine (SYNTHROID, LEVOTHROID) 125 MCG tablet Take 62.5 mcg by mouth daily before breakfast.  . loratadine (CLARITIN) 10 MG tablet Take 10 mg by mouth daily.  [name] omeprazole (PRILOSEC) 20 MG capsule Take 20 mg by mouth daily.    Vladimir Faster Glycol-Propyl Glycol (SYSTANE) 0.4-0.3 % SOLN Place 1 drop into both eyes 2 (two) times daily.   . simvastatin (ZOCOR) 20 MG tablet TAKE 1 TAB BY MOUTH AT BEDTIME  . timolol (TIMOPTIC) 0.5 % ophthalmic solution Place 1 drop into both eyes 2 (two) times daily.    . traMADol (ULTRAM) 50 MG tablet Take 1 tablet (50 mg total) by mouth 3 (three) times daily as needed.  . triamcinolone (KENALOG) 0.025 % cream Apply 1 application topically 2 (two) times daily.   . [DISCONTINUED] allopurinol (ZYLOPRIM) 100 MG tablet Take 100 mg by mouth daily.      Allergies  Allergen Reactions  . Enablex [Darifenacin Hydrobromide Er]     Caused her throat and mouth to have bumps and feel like it was swelling  . Clarithromycin Swelling    REACTION: causes her mouth to swell  . Codeine Other (See Comments)    Makes pt nervous  . Morphine Nausea And Vomiting    REACTION: n/v  . Sulfonamide Derivatives Other (See Comments)    REACTION: unsure of reaction    Current Medications, Allergies, Past Medical History, Past Surgical History, Family History, and Social History were reviewed in [employer] record.    Review of Systems        See HPI - all other systems neg except as noted...  The patient complains of dyspnea on exertion, peripheral edema, muscle weakness, and difficulty walking.  The patient denies anorexia, fever, weight loss, weight gain, vision loss, decreased hearing, hoarseness, chest pain, syncope, prolonged cough, headaches, hemoptysis, abdominal pain, melena,  hematochezia, severe indigestion/heartburn, hematuria, incontinence, genital sores, suspicious skin lesions, transient blindness, depression, unusual weight change, abnormal bleeding, enlarged lymph nodes, and angioedema.     Objective:   Physical Exam      WD, WN, 80 y/o WF in NAD... GENERAL:  Alert & oriented; pleasant & cooperative... HEENT:  [place]/AT, EOM- full, EACs-clear, TMs-wnl, NOSE-clear, THROAT-clear & wnl. NECK:  Supple w/ fairROM; no JVD; normal carotid impulses w/o bruits; no thyromegaly or nodules palpated; no lymphadenopathy. CHEST:  Clear, decr BS bilat w/o wheezing, rales, or rhonchi heard... HEART:  Regular Rhythm;  gr 1/6 SEM without rubs or gallops detected... ABDOMEN:  Soft & nontender; normal bowel sounds; no organomegaly or masses detected. EXT:  moderate arthritic changes; s/p bilat TKRs; mild varicose veins/ +venous insuffic/ 2+edema R>L s/p right second toe distal amputation... NEURO:  CN's intact;  no focal neuro deficits... DERM:  No lesions noted; no rash etc...  RADIOLOGY DATA:  Reviewed in the EPIC EMR & discussed w/ the patient...  LABORATORY DATA:  Reviewed in the EPIC EMR & discussed w/ the patient...   Assessment & Plan:    Episode of cholecystitis, gallstones 1/15- s/p ERCP & improved w/o surg (DrStark, DrCornett)...   HBP>  Controlled on meds, continue same including the Lasix40- 2Qam...  Cardiac>  Prev DrRoss' note reviewed; see 2DEcho, Event Monitor reports no dangerous arrhythmias, continue meds...  Ven Insuffic/ EDEMA- improved>  evals by Cards & [facility]> "there is nothing they can do"; continue elevation, no salt, Lasix80 now, compression...  CHOL>  On Simva20 & FLP 5/13 looked good...  HYPOTHYROID>  Continue Synthroid 145mcg/d  taking 1/2 tab daily...  GI>  Stable now s/p ERCP for gallstones; followed by DrStark now...  Renal Insuffic>  Careful w/ diuresis, NSAIDs etc; Creat varied 1.1 to 1.7  DJD>  She saw DrDeveshwar for her Rheum  complaints==> shot in knee; DrYates/ Ernestina Patches have signed off; may need pain clinic....  Other problems as noted...   Patient's Medications  New Prescriptions   HYDROXYZINE (ATARAX/VISTARIL) 10 MG TABLET    Take 1-2 tablets by mouth every 6 hours as needed for itching  Previous Medications   ACETAMINOPHEN (TYLENOL) 325 MG TABLET    Take 325 mg by mouth every 4 (four) hours as needed for mild pain or moderate pain.    ALPRAZOLAM (XANAX) 0.25 MG TABLET    Take 0.125-1 mg by mouth 3 (three) times daily as needed for anxiety.   ASCORBIC ACID (VITAMIN C) 500 MG TABLET    Take 500 mg by mouth daily.     ASPIRIN 81 MG TABLET    Take 81 mg by mouth daily.     CHOLECALCIFEROL (VITAMIN D) 1000 UNITS CAPSULE    Take 1,000 Units by mouth daily.     DILTIAZEM (CARDIZEM CD) 180 MG 24 HR CAPSULE    TAKE 1 CAPSULE DAILY   FUROSEMIDE (LASIX) 40 MG TABLET    TAKE 2 TABLETS BY MOUTH ONCE EVERY MORNING   LEVOTHYROXINE (SYNTHROID, LEVOTHROID) 125 MCG TABLET    Take 62.5 mcg by mouth daily before breakfast.   LORATADINE (CLARITIN) 10 MG TABLET    Take 10 mg by mouth daily.   OMEPRAZOLE (PRILOSEC) 20 MG CAPSULE    Take 20 mg by mouth daily.     POLYETHYL GLYCOL-PROPYL GLYCOL (SYSTANE) 0.4-0.3 % SOLN    Place 1 drop into both eyes 2 (two) times daily.    SIMVASTATIN (ZOCOR) 20 MG TABLET    TAKE 1 TAB BY MOUTH AT BEDTIME   TIMOLOL (TIMOPTIC) 0.5 % OPHTHALMIC SOLUTION    Place 1 drop into both eyes 2 (two) times daily.     TRAMADOL (ULTRAM) 50 MG TABLET    Take 1 tablet (50 mg total) by mouth 3 (three) times daily as needed.   TRIAMCINOLONE (KENALOG) 0.025 % CREAM    Apply 1 application topically 2 (two) times daily.   Modified Medications   No medications on file  Discontinued Medications   ALLOPURINOL (ZYLOPRIM) 100 MG TABLET    Take 100 mg by mouth daily.

## 2013-12-15 NOTE — Patient Instructions (Signed)
Today we updated your med list in our EPIC system...    Continue your current medications the same...  We added ATARAX 10mg  take 1-2 tabs every 6h as needed for itching...  Call for any questions or if we can beof service in any way.Marland KitchenMarland Kitchen

## 2014-01-04 DIAGNOSIS — M169 Osteoarthritis of hip, unspecified: Secondary | ICD-10-CM | POA: Diagnosis not present

## 2014-01-04 DIAGNOSIS — M2550 Pain in unspecified joint: Secondary | ICD-10-CM | POA: Diagnosis not present

## 2014-01-04 DIAGNOSIS — M1610 Unilateral primary osteoarthritis, unspecified hip: Secondary | ICD-10-CM | POA: Diagnosis not present

## 2014-01-04 DIAGNOSIS — M1710 Unilateral primary osteoarthritis, unspecified knee: Secondary | ICD-10-CM | POA: Diagnosis not present

## 2014-01-04 DIAGNOSIS — M5137 Other intervertebral disc degeneration, lumbosacral region: Secondary | ICD-10-CM | POA: Diagnosis not present

## 2014-01-04 DIAGNOSIS — M47817 Spondylosis without myelopathy or radiculopathy, lumbosacral region: Secondary | ICD-10-CM | POA: Diagnosis not present

## 2014-01-04 DIAGNOSIS — Z79899 Other long term (current) drug therapy: Secondary | ICD-10-CM | POA: Diagnosis not present

## 2014-01-10 DIAGNOSIS — D1739 Benign lipomatous neoplasm of skin and subcutaneous tissue of other sites: Secondary | ICD-10-CM | POA: Diagnosis not present

## 2014-01-10 DIAGNOSIS — Z85828 Personal history of other malignant neoplasm of skin: Secondary | ICD-10-CM | POA: Diagnosis not present

## 2014-01-10 DIAGNOSIS — I8310 Varicose veins of unspecified lower extremity with inflammation: Secondary | ICD-10-CM | POA: Diagnosis not present

## 2014-01-10 DIAGNOSIS — D692 Other nonthrombocytopenic purpura: Secondary | ICD-10-CM | POA: Diagnosis not present

## 2014-01-10 DIAGNOSIS — L408 Other psoriasis: Secondary | ICD-10-CM | POA: Diagnosis not present

## 2014-01-10 DIAGNOSIS — M47817 Spondylosis without myelopathy or radiculopathy, lumbosacral region: Secondary | ICD-10-CM | POA: Diagnosis not present

## 2014-02-01 DIAGNOSIS — H04129 Dry eye syndrome of unspecified lacrimal gland: Secondary | ICD-10-CM | POA: Diagnosis not present

## 2014-02-01 DIAGNOSIS — Z947 Corneal transplant status: Secondary | ICD-10-CM | POA: Diagnosis not present

## 2014-02-01 DIAGNOSIS — H409 Unspecified glaucoma: Secondary | ICD-10-CM | POA: Diagnosis not present

## 2014-02-01 DIAGNOSIS — H4011X0 Primary open-angle glaucoma, stage unspecified: Secondary | ICD-10-CM | POA: Diagnosis not present

## 2014-02-09 DIAGNOSIS — S96819A Strain of other specified muscles and tendons at ankle and foot level, unspecified foot, initial encounter: Secondary | ICD-10-CM | POA: Diagnosis not present

## 2014-02-09 DIAGNOSIS — I872 Venous insufficiency (chronic) (peripheral): Secondary | ICD-10-CM | POA: Diagnosis not present

## 2014-02-09 DIAGNOSIS — S93499A Sprain of other ligament of unspecified ankle, initial encounter: Secondary | ICD-10-CM | POA: Diagnosis not present

## 2014-02-16 DIAGNOSIS — S93499A Sprain of other ligament of unspecified ankle, initial encounter: Secondary | ICD-10-CM | POA: Diagnosis not present

## 2014-02-16 DIAGNOSIS — I872 Venous insufficiency (chronic) (peripheral): Secondary | ICD-10-CM | POA: Diagnosis not present

## 2014-02-17 ENCOUNTER — Encounter (HOSPITAL_COMMUNITY): Payer: Self-pay | Admitting: Emergency Medicine

## 2014-02-17 ENCOUNTER — Emergency Department (HOSPITAL_COMMUNITY)
Admission: EM | Admit: 2014-02-17 | Discharge: 2014-02-18 | Disposition: A | Discharge status: Home / Self Care | Payer: Medicare Other | Attending: Emergency Medicine | Admitting: Emergency Medicine

## 2014-02-17 DIAGNOSIS — E039 Hypothyroidism, unspecified: Secondary | ICD-10-CM | POA: Insufficient documentation

## 2014-02-17 DIAGNOSIS — Y92009 Unspecified place in unspecified non-institutional (private) residence as the place of occurrence of the external cause: Secondary | ICD-10-CM | POA: Insufficient documentation

## 2014-02-17 DIAGNOSIS — W2203XA Walked into furniture, initial encounter: Secondary | ICD-10-CM | POA: Insufficient documentation

## 2014-02-17 DIAGNOSIS — E559 Vitamin D deficiency, unspecified: Secondary | ICD-10-CM | POA: Diagnosis not present

## 2014-02-17 DIAGNOSIS — K219 Gastro-esophageal reflux disease without esophagitis: Secondary | ICD-10-CM | POA: Diagnosis not present

## 2014-02-17 DIAGNOSIS — IMO0002 Reserved for concepts with insufficient information to code with codable children: Secondary | ICD-10-CM | POA: Diagnosis not present

## 2014-02-17 DIAGNOSIS — F411 Generalized anxiety disorder: Secondary | ICD-10-CM | POA: Diagnosis not present

## 2014-02-17 DIAGNOSIS — S81009A Unspecified open wound, unspecified knee, initial encounter: Secondary | ICD-10-CM | POA: Insufficient documentation

## 2014-02-17 DIAGNOSIS — H409 Unspecified glaucoma: Secondary | ICD-10-CM | POA: Insufficient documentation

## 2014-02-17 DIAGNOSIS — E7800 Pure hypercholesterolemia, unspecified: Secondary | ICD-10-CM | POA: Diagnosis not present

## 2014-02-17 DIAGNOSIS — Z8709 Personal history of other diseases of the respiratory system: Secondary | ICD-10-CM | POA: Insufficient documentation

## 2014-02-17 DIAGNOSIS — S81809A Unspecified open wound, unspecified lower leg, initial encounter: Principal | ICD-10-CM

## 2014-02-17 DIAGNOSIS — Z79899 Other long term (current) drug therapy: Secondary | ICD-10-CM | POA: Insufficient documentation

## 2014-02-17 DIAGNOSIS — I10 Essential (primary) hypertension: Secondary | ICD-10-CM | POA: Diagnosis not present

## 2014-02-17 DIAGNOSIS — Y9301 Activity, walking, marching and hiking: Secondary | ICD-10-CM | POA: Insufficient documentation

## 2014-02-17 DIAGNOSIS — S91009A Unspecified open wound, unspecified ankle, initial encounter: Principal | ICD-10-CM

## 2014-02-17 DIAGNOSIS — N183 Chronic kidney disease, stage 3 unspecified: Secondary | ICD-10-CM | POA: Insufficient documentation

## 2014-02-17 DIAGNOSIS — I129 Hypertensive chronic kidney disease with stage 1 through stage 4 chronic kidney disease, or unspecified chronic kidney disease: Secondary | ICD-10-CM | POA: Diagnosis not present

## 2014-02-17 DIAGNOSIS — Z8739 Personal history of other diseases of the musculoskeletal system and connective tissue: Secondary | ICD-10-CM | POA: Insufficient documentation

## 2014-02-17 DIAGNOSIS — Z96659 Presence of unspecified artificial knee joint: Secondary | ICD-10-CM | POA: Insufficient documentation

## 2014-02-17 DIAGNOSIS — Z7982 Long term (current) use of aspirin: Secondary | ICD-10-CM | POA: Insufficient documentation

## 2014-02-17 NOTE — ED Notes (Addendum)
Patient sustained skin tear to right lower extremity when trying to reach phone. No other injury sustained aside from the leg. Patient did not fall. Ambulatory from waiting area to triage room with cane. No blood thinners reported aside from daily ASA.

## 2014-02-17 NOTE — ED Provider Notes (Signed)
CSN: [id number]     Arrival date & time 02/17/14  2015 History  This chart was scribed for non-physician practitioner Cleatrice Burke, PA working with Blanchard Kelch, MD by Mercy Moore, ED Scribe. This patient was seen in room TR05C/TR05C and the patient's care was started at 9:31 PM.   Chief Complaint  Patient presents with  . Extremity Laceration     The history is provided by the patient. No language interpreter was used.   HPI Comments: [name] is a 80 y.o. female who presents to the Emergency Department with wound to her  right lower leg that occurred this afternoon at 4pm. Patient walking into her home and hearing the phone ring, she reached for the phone and hit her leg on the edge of the coffee table. Patient states the edge of the table was sharp which is why she cut her leg. Patient did not fall. Patient is ambulatory. Patient reports wearing compression hoses at time of injury. Patient reports continued weeping from the wound; wound actively weeping during examination.      Past Medical History  Diagnosis Date  . Hypertension   . Hypothyroidism   . Glaucoma   . Acute bronchitis   . Palpitations   . Unspecified venous (peripheral) insufficiency   . Pure hypercholesterolemia   . GERD (gastroesophageal reflux disease)   . Diverticulosis of colon (without mention of hemorrhage)   . Benign neoplasm of colon   . Renal insufficiency   . DJD (degenerative joint disease)   . Vitamin D deficiency disease   . Anxiety state, unspecified   . CKD (chronic kidney disease), stage III 10/06/2013   Past Surgical History  Procedure Laterality Date  . Cataract extraction    . Abdominal hysterectomy    . Breast biopsy      Benign  . Total knee arthroplasty  1999    right  . Total knee arthroplasty  11/2008    left - Dr Lorin Mercy  . Toe amputation  08/2009    Right second toe - Dr Beola Cord  . Ercp N/A 10/06/2013    Procedure: ENDOSCOPIC RETROGRADE CHOLANGIOPANCREATOGRAPHY  (ERCP);  Surgeon: Ladene Artist, MD;  Location: [facility];  Service: Endoscopy;  Laterality: N/A;   Family History  Problem Relation Age of Onset  . Heart disease Mother   . Cancer Father     Throat  . Heart disease Father    History  Substance Use Topics  . Smoking status: Never Smoker   . Smokeless tobacco: Never Used  . Alcohol Use: No   OB History   Grav Para Term Preterm Abortions TAB SAB Ect Mult Living                 Review of Systems  Constitutional: Negative for fever and chills.  Respiratory: Negative for shortness of breath.   Cardiovascular: Negative for chest pain.  Skin: Positive for wound.  Neurological: Negative for dizziness and headaches.  All other systems reviewed and are negative.     Allergies  Enablex; Clarithromycin; Codeine; Morphine; and Sulfonamide derivatives  Home Medications   Prior to Admission medications   Medication Sig Start Date End Date Taking? Authorizing Provider  acetaminophen (TYLENOL) 325 MG tablet Take 325 mg by mouth every 4 (four) hours as needed for mild pain or moderate pain.    Yes Historical Provider, MD  ALPRAZolam (XANAX) 0.25 MG tablet Take 0.125-1 mg by mouth 3 (three) times daily as needed for anxiety.  Yes Historical Provider, MD  Ascorbic Acid (VITAMIN C) 500 MG tablet Take 500 mg by mouth daily.     Yes Historical Provider, MD  aspirin 81 MG tablet Take 81 mg by mouth daily.     Yes Historical Provider, MD  Cholecalciferol (VITAMIN D) 1000 UNITS capsule Take 1,000 Units by mouth daily.     Yes Historical Provider, MD  diltiazem (CARDIZEM CD) 180 MG 24 hr capsule TAKE 1 CAPSULE DAILY 08/16/13  Yes Noralee Space, MD  furosemide (LASIX) 40 MG tablet TAKE 2 TABLETS BY MOUTH ONCE EVERY MORNING 07/29/13  Yes Noralee Space, MD  hydrOXYzine (ATARAX/VISTARIL) 10 MG tablet Take 1-2 tablets by mouth every 6 hours as needed for itching 12/15/13  Yes Noralee Space, MD  levothyroxine (SYNTHROID, LEVOTHROID) 125 MCG tablet Take  62.5 mcg by mouth daily before breakfast.   Yes Historical Provider, MD  loratadine (CLARITIN) 10 MG tablet Take 10 mg by mouth daily.   Yes Historical Provider, MD  omeprazole (PRILOSEC) 20 MG capsule Take 20 mg by mouth daily.     Yes Historical Provider, MD  Polyethyl Glycol-Propyl Glycol (SYSTANE) 0.4-0.3 % SOLN Place 1 drop into both eyes 2 (two) times daily.    Yes Historical Provider, MD  simvastatin (ZOCOR) 20 MG tablet TAKE 1 TAB BY MOUTH AT BEDTIME 06/28/13  Yes Noralee Space, MD  timolol (TIMOPTIC) 0.5 % ophthalmic solution Place 1 drop into both eyes 2 (two) times daily.     Yes Historical Provider, MD  traMADol (ULTRAM) 50 MG tablet Take 1 tablet (50 mg total) by mouth 3 (three) times daily as needed. 08/16/13  Yes Noralee Space, MD  triamcinolone (KENALOG) 0.025 % cream Apply 1 application topically 2 (two) times daily.  09/11/12  Yes Historical Provider, MD   Triage Vitals: BP 166/52  Pulse 74  Temp(Src) 98.3 F (36.8 C) (Oral)  Resp 18  SpO2 100% Physical Exam  Nursing note and vitals reviewed. Constitutional: She is oriented to person, place, and time. She appears well-developed and well-nourished. No distress.  HENT:  Head: Normocephalic and atraumatic.  Right Ear: External ear normal.  Left Ear: External ear normal.  Nose: Nose normal.  Mouth/Throat: Oropharynx is clear and moist.  Eyes: Conjunctivae are normal.  Neck: Normal range of motion.  Cardiovascular: Normal rate, regular rhythm and normal heart sounds.   Pulmonary/Chest: Effort normal and breath sounds normal. No stridor. No respiratory distress. She has no wheezes. She has no rales.  Abdominal: Soft. She exhibits no distension.  Musculoskeletal: Normal range of motion.  Neurological: She is alert and oriented to person, place, and time. She has normal strength.  Skin: Skin is warm and dry. She is not diaphoretic. No erythema.  Chronic venous stasis to lower extremities bilaterally.  3 cm laceration with  fatty exposure to right lower extremities. Would is weeping, but bleeding is controlled. Patient is ambulatory.   Psychiatric: She has a normal mood and affect. Her behavior is normal.    ED Course  Procedures (including critical care time) DIAGNOSTIC STUDIES: Oxygen Saturation is 100 on room air, normal by my interpretation.    COORDINATION OF CARE: 9:31 PM- Discussed treatment plan with patient at bedside and patient agreed to plan.     Labs Review Labs Reviewed - No data to display  Imaging Review No results found.   EKG Interpretation None      LACERATION REPAIR Performed by: Elwyn Lade Authorized by: Elwyn Lade  Consent: Verbal consent obtained. Risks and benefits: risks, benefits and alternatives were discussed Consent given by: patient Patient identity confirmed: provided demographic data Prepped and Draped in normal sterile fashion Wound explored  Laceration Location: right leg  Laceration Length: 3 cm  No Foreign Bodies seen or palpated  Anesthesia: local infiltration  Local anesthetic: lidocaine 2%   Anesthetic total: 5 ml  Irrigation method: syringe Amount of cleaning: standard  Skin closure: 4-0 Ethilon, 5-0 vicryl  Number of sutures: 4 Ethilon, 1 vicryl  Technique: Ethilon simple interrupted, vicryl buried   Patient tolerance: Patient tolerated the procedure well with no immediate complications.   MDM   Final diagnoses:  Laceration    Tdap booster UTD. Wound cleaning complete with pressure irrigation, bottom of wound visualized, no foreign bodies appreciated. Laceration occurred < 8 hours prior to repair which was well tolerated. Discussed suture home care w pt and answered questions. Pt to f-u for wound check and suture removal in 7 days. Pt is hemodynamically stable w no complaints prior to dc. Dr. Aline Brochure evaluated this patient and agrees with plan. Patient / Family / Caregiver informed of clinical course, understand medical  decision-making process, and agree with plan.   I personally performed the services described in this documentation, which was scribed in my presence. The recorded information has been reviewed and is accurate.    Elwyn Lade, PA-C 02/18/14 1130

## 2014-02-18 NOTE — Discharge Instructions (Signed)

## 2014-02-21 DIAGNOSIS — L259 Unspecified contact dermatitis, unspecified cause: Secondary | ICD-10-CM | POA: Diagnosis not present

## 2014-02-21 DIAGNOSIS — L905 Scar conditions and fibrosis of skin: Secondary | ICD-10-CM | POA: Diagnosis not present

## 2014-02-21 DIAGNOSIS — I8310 Varicose veins of unspecified lower extremity with inflammation: Secondary | ICD-10-CM | POA: Diagnosis not present

## 2014-02-21 DIAGNOSIS — Z85828 Personal history of other malignant neoplasm of skin: Secondary | ICD-10-CM | POA: Diagnosis not present

## 2014-02-22 NOTE — ED Provider Notes (Signed)
Medical screening examination/treatment/procedure(s) were conducted as a shared visit with non-physician practitioner(s) and myself.  I personally evaluated the patient during the encounter.   EKG Interpretation None      I interviewed and examined the patient. Lungs are CTAB. Cardiac exam wnl. Abdomen soft.  3cm lac to RLE. Hemostatic. Will have PA repair wound. Chronic red/purple appearance of LE's likely d/t venous stasis dermatitis.    Blanchard Kelch, MD 02/22/14 1348

## 2014-03-01 DIAGNOSIS — M25559 Pain in unspecified hip: Secondary | ICD-10-CM | POA: Diagnosis not present

## 2014-03-03 ENCOUNTER — Other Ambulatory Visit: Payer: Self-pay | Admitting: Pulmonary Disease

## 2014-03-08 DIAGNOSIS — Z85828 Personal history of other malignant neoplasm of skin: Secondary | ICD-10-CM | POA: Diagnosis not present

## 2014-03-08 DIAGNOSIS — I8310 Varicose veins of unspecified lower extremity with inflammation: Secondary | ICD-10-CM | POA: Diagnosis not present

## 2014-03-08 DIAGNOSIS — L905 Scar conditions and fibrosis of skin: Secondary | ICD-10-CM | POA: Diagnosis not present

## 2014-03-11 DIAGNOSIS — H10509 Unspecified blepharoconjunctivitis, unspecified eye: Secondary | ICD-10-CM | POA: Diagnosis not present

## 2014-03-11 DIAGNOSIS — H01009 Unspecified blepharitis unspecified eye, unspecified eyelid: Secondary | ICD-10-CM | POA: Diagnosis not present

## 2014-03-11 DIAGNOSIS — H409 Unspecified glaucoma: Secondary | ICD-10-CM | POA: Diagnosis not present

## 2014-03-11 DIAGNOSIS — H00029 Hordeolum internum unspecified eye, unspecified eyelid: Secondary | ICD-10-CM | POA: Diagnosis not present

## 2014-03-11 DIAGNOSIS — H109 Unspecified conjunctivitis: Secondary | ICD-10-CM | POA: Diagnosis not present

## 2014-03-11 DIAGNOSIS — H547 Unspecified visual loss: Secondary | ICD-10-CM | POA: Diagnosis not present

## 2014-03-11 DIAGNOSIS — Z947 Corneal transplant status: Secondary | ICD-10-CM | POA: Diagnosis not present

## 2014-03-13 ENCOUNTER — Other Ambulatory Visit: Payer: Self-pay | Admitting: Pulmonary Disease

## 2014-03-18 DIAGNOSIS — H01009 Unspecified blepharitis unspecified eye, unspecified eyelid: Secondary | ICD-10-CM | POA: Diagnosis not present

## 2014-03-18 DIAGNOSIS — Z947 Corneal transplant status: Secondary | ICD-10-CM | POA: Diagnosis not present

## 2014-03-30 DIAGNOSIS — Z85828 Personal history of other malignant neoplasm of skin: Secondary | ICD-10-CM | POA: Diagnosis not present

## 2014-03-30 DIAGNOSIS — I8310 Varicose veins of unspecified lower extremity with inflammation: Secondary | ICD-10-CM | POA: Diagnosis not present

## 2014-03-30 DIAGNOSIS — L905 Scar conditions and fibrosis of skin: Secondary | ICD-10-CM | POA: Diagnosis not present

## 2014-03-30 DIAGNOSIS — L723 Sebaceous cyst: Secondary | ICD-10-CM | POA: Diagnosis not present

## 2014-05-25 DIAGNOSIS — H4011X0 Primary open-angle glaucoma, stage unspecified: Secondary | ICD-10-CM | POA: Diagnosis not present

## 2014-05-25 DIAGNOSIS — H409 Unspecified glaucoma: Secondary | ICD-10-CM | POA: Diagnosis not present

## 2014-05-25 DIAGNOSIS — H4060X0 Glaucoma secondary to drugs, unspecified eye, stage unspecified: Secondary | ICD-10-CM | POA: Diagnosis not present

## 2014-05-25 DIAGNOSIS — T380X5A Adverse effect of glucocorticoids and synthetic analogues, initial encounter: Secondary | ICD-10-CM | POA: Diagnosis not present

## 2014-05-31 ENCOUNTER — Emergency Department (HOSPITAL_COMMUNITY): Payer: Medicare Other

## 2014-05-31 ENCOUNTER — Inpatient Hospital Stay (HOSPITAL_COMMUNITY)
Admission: EM | Admit: 2014-05-31 | Discharge: 2014-06-07 | DRG: 872 | Disposition: A | Discharge status: Home Health | Payer: Medicare Other | Attending: Internal Medicine | Admitting: Internal Medicine

## 2014-05-31 ENCOUNTER — Encounter (HOSPITAL_COMMUNITY): Payer: Self-pay | Admitting: Emergency Medicine

## 2014-05-31 DIAGNOSIS — R0609 Other forms of dyspnea: Secondary | ICD-10-CM | POA: Diagnosis not present

## 2014-05-31 DIAGNOSIS — K810 Acute cholecystitis: Secondary | ICD-10-CM

## 2014-05-31 DIAGNOSIS — A419 Sepsis, unspecified organism: Principal | ICD-10-CM

## 2014-05-31 DIAGNOSIS — Z9849 Cataract extraction status, unspecified eye: Secondary | ICD-10-CM | POA: Diagnosis not present

## 2014-05-31 DIAGNOSIS — Z7982 Long term (current) use of aspirin: Secondary | ICD-10-CM | POA: Diagnosis not present

## 2014-05-31 DIAGNOSIS — Z23 Encounter for immunization: Secondary | ICD-10-CM

## 2014-05-31 DIAGNOSIS — M1990 Unspecified osteoarthritis, unspecified site: Secondary | ICD-10-CM

## 2014-05-31 DIAGNOSIS — Z6826 Body mass index (BMI) 26.0-26.9, adult: Secondary | ICD-10-CM | POA: Diagnosis not present

## 2014-05-31 DIAGNOSIS — K8050 Calculus of bile duct without cholangitis or cholecystitis without obstruction: Secondary | ICD-10-CM

## 2014-05-31 DIAGNOSIS — K219 Gastro-esophageal reflux disease without esophagitis: Secondary | ICD-10-CM | POA: Diagnosis present

## 2014-05-31 DIAGNOSIS — M7989 Other specified soft tissue disorders: Secondary | ICD-10-CM | POA: Diagnosis not present

## 2014-05-31 DIAGNOSIS — R609 Edema, unspecified: Secondary | ICD-10-CM

## 2014-05-31 DIAGNOSIS — N259 Disorder resulting from impaired renal tubular function, unspecified: Secondary | ICD-10-CM

## 2014-05-31 DIAGNOSIS — R6889 Other general symptoms and signs: Secondary | ICD-10-CM | POA: Diagnosis not present

## 2014-05-31 DIAGNOSIS — R918 Other nonspecific abnormal finding of lung field: Secondary | ICD-10-CM | POA: Diagnosis not present

## 2014-05-31 DIAGNOSIS — Z79899 Other long term (current) drug therapy: Secondary | ICD-10-CM | POA: Diagnosis not present

## 2014-05-31 DIAGNOSIS — I129 Hypertensive chronic kidney disease with stage 1 through stage 4 chronic kidney disease, or unspecified chronic kidney disease: Secondary | ICD-10-CM | POA: Diagnosis present

## 2014-05-31 DIAGNOSIS — I872 Venous insufficiency (chronic) (peripheral): Secondary | ICD-10-CM

## 2014-05-31 DIAGNOSIS — E559 Vitamin D deficiency, unspecified: Secondary | ICD-10-CM

## 2014-05-31 DIAGNOSIS — L02419 Cutaneous abscess of limb, unspecified: Secondary | ICD-10-CM | POA: Diagnosis present

## 2014-05-31 DIAGNOSIS — E7800 Pure hypercholesterolemia, unspecified: Secondary | ICD-10-CM | POA: Diagnosis present

## 2014-05-31 DIAGNOSIS — D72829 Elevated white blood cell count, unspecified: Secondary | ICD-10-CM | POA: Diagnosis present

## 2014-05-31 DIAGNOSIS — N183 Chronic kidney disease, stage 3 unspecified: Secondary | ICD-10-CM | POA: Diagnosis present

## 2014-05-31 DIAGNOSIS — E039 Hypothyroidism, unspecified: Secondary | ICD-10-CM

## 2014-05-31 DIAGNOSIS — B029 Zoster without complications: Secondary | ICD-10-CM

## 2014-05-31 DIAGNOSIS — I359 Nonrheumatic aortic valve disorder, unspecified: Secondary | ICD-10-CM | POA: Diagnosis not present

## 2014-05-31 DIAGNOSIS — E440 Moderate protein-calorie malnutrition: Secondary | ICD-10-CM | POA: Diagnosis present

## 2014-05-31 DIAGNOSIS — L03119 Cellulitis of unspecified part of limb: Secondary | ICD-10-CM

## 2014-05-31 DIAGNOSIS — L0390 Cellulitis, unspecified: Secondary | ICD-10-CM | POA: Diagnosis present

## 2014-05-31 DIAGNOSIS — Z8679 Personal history of other diseases of the circulatory system: Secondary | ICD-10-CM

## 2014-05-31 DIAGNOSIS — H409 Unspecified glaucoma: Secondary | ICD-10-CM | POA: Diagnosis present

## 2014-05-31 DIAGNOSIS — F411 Generalized anxiety disorder: Secondary | ICD-10-CM

## 2014-05-31 DIAGNOSIS — L03116 Cellulitis of left lower limb: Secondary | ICD-10-CM

## 2014-05-31 DIAGNOSIS — S98139A Complete traumatic amputation of one unspecified lesser toe, initial encounter: Secondary | ICD-10-CM | POA: Diagnosis not present

## 2014-05-31 DIAGNOSIS — Z96659 Presence of unspecified artificial knee joint: Secondary | ICD-10-CM | POA: Diagnosis not present

## 2014-05-31 DIAGNOSIS — I10 Essential (primary) hypertension: Secondary | ICD-10-CM

## 2014-05-31 DIAGNOSIS — R42 Dizziness and giddiness: Secondary | ICD-10-CM

## 2014-05-31 DIAGNOSIS — M79609 Pain in unspecified limb: Secondary | ICD-10-CM | POA: Diagnosis not present

## 2014-05-31 DIAGNOSIS — R509 Fever, unspecified: Secondary | ICD-10-CM | POA: Diagnosis not present

## 2014-05-31 DIAGNOSIS — K5730 Diverticulosis of large intestine without perforation or abscess without bleeding: Secondary | ICD-10-CM

## 2014-05-31 DIAGNOSIS — R112 Nausea with vomiting, unspecified: Secondary | ICD-10-CM | POA: Diagnosis not present

## 2014-05-31 DIAGNOSIS — D126 Benign neoplasm of colon, unspecified: Secondary | ICD-10-CM

## 2014-05-31 LAB — CBC WITH DIFFERENTIAL/PLATELET
BASOS ABS: 0 10*3/uL (ref 0.0–0.1)
Basophils Relative: 0 % (ref 0–1)
Eosinophils Absolute: 0 10*3/uL (ref 0.0–0.7)
Eosinophils Relative: 0 % (ref 0–5)
HCT: 39.9 % (ref 36.0–46.0)
Hemoglobin: 12.7 g/dL (ref 12.0–15.0)
LYMPHS ABS: 0.4 10*3/uL — AB (ref 0.7–4.0)
LYMPHS PCT: 2 % — AB (ref 12–46)
MCH: 29.4 pg (ref 26.0–34.0)
MCHC: 31.8 g/dL (ref 30.0–36.0)
MCV: 92.4 fL (ref 78.0–100.0)
Monocytes Absolute: 0.4 10*3/uL (ref 0.1–1.0)
Monocytes Relative: 3 % (ref 3–12)
NEUTROS ABS: 14.1 10*3/uL — AB (ref 1.7–7.7)
Neutrophils Relative %: 95 % — ABNORMAL HIGH (ref 43–77)
PLATELETS: 164 10*3/uL (ref 150–400)
RBC: 4.32 MIL/uL (ref 3.87–5.11)
RDW: 15.7 % — ABNORMAL HIGH (ref 11.5–15.5)
WBC: 14.9 10*3/uL — AB (ref 4.0–10.5)

## 2014-05-31 LAB — COMPREHENSIVE METABOLIC PANEL
ALBUMIN: 3.2 g/dL — AB (ref 3.5–5.2)
ALT: 18 U/L (ref 0–35)
AST: 29 U/L (ref 0–37)
Alkaline Phosphatase: 75 U/L (ref 39–117)
Anion gap: 14 (ref 5–15)
BUN: 34 mg/dL — AB (ref 6–23)
CALCIUM: 8.9 mg/dL (ref 8.4–10.5)
CO2: 30 mEq/L (ref 19–32)
Chloride: 95 mEq/L — ABNORMAL LOW (ref 96–112)
Creatinine, Ser: 1.39 mg/dL — ABNORMAL HIGH (ref 0.50–1.10)
GFR calc Af Amer: 36 mL/min — ABNORMAL LOW (ref >90)
GFR calc non Af Amer: 31 mL/min — ABNORMAL LOW (ref >90)
Glucose, Bld: 115 mg/dL — ABNORMAL HIGH (ref 70–99)
Potassium: 3.5 mEq/L — ABNORMAL LOW (ref 3.7–5.3)
Sodium: 139 mEq/L (ref 137–147)
TOTAL PROTEIN: 6.4 g/dL (ref 6.0–8.3)
Total Bilirubin: 0.4 mg/dL (ref 0.3–1.2)

## 2014-05-31 LAB — I-STAT CG4 LACTIC ACID, ED: LACTIC ACID, VENOUS: 2.05 mmol/L (ref 0.5–2.2)

## 2014-05-31 LAB — URINALYSIS, ROUTINE W REFLEX MICROSCOPIC
BILIRUBIN URINE: NEGATIVE
Glucose, UA: NEGATIVE mg/dL
Hgb urine dipstick: NEGATIVE
Ketones, ur: NEGATIVE mg/dL
LEUKOCYTES UA: NEGATIVE
NITRITE: NEGATIVE
PH: 5.5 (ref 5.0–8.0)
Protein, ur: NEGATIVE mg/dL
SPECIFIC GRAVITY, URINE: 1.012 (ref 1.005–1.030)
UROBILINOGEN UA: 0.2 mg/dL (ref 0.0–1.0)

## 2014-05-31 LAB — LIPASE, BLOOD: Lipase: 14 U/L (ref 11–59)

## 2014-05-31 MED ORDER — DEXTROSE 5 % IV SOLN
1.0000 g | Freq: Two times a day (BID) | INTRAVENOUS | Status: DC
  Administered 2014-05-31: 1 g via INTRAVENOUS
  Filled 2014-05-31: qty 1

## 2014-05-31 MED ORDER — SODIUM CHLORIDE 0.9 % IV BOLUS (SEPSIS)
1000.0000 mL | Freq: Once | INTRAVENOUS | Status: AC
  Administered 2014-05-31: 1000 mL via INTRAVENOUS

## 2014-05-31 MED ORDER — ACETAMINOPHEN 325 MG PO TABS
650.0000 mg | ORAL_TABLET | Freq: Four times a day (QID) | ORAL | Status: DC | PRN
  Administered 2014-06-03: 650 mg via ORAL
  Filled 2014-05-31: qty 2

## 2014-05-31 MED ORDER — LEVOFLOXACIN IN D5W 750 MG/150ML IV SOLN: 750.0000 mg | Freq: Once | INTRAVENOUS | Status: DC

## 2014-05-31 MED ORDER — ACETAMINOPHEN 325 MG PO TABS
650.0000 mg | ORAL_TABLET | Freq: Once | ORAL | Status: AC
  Administered 2014-05-31: 650 mg via ORAL
  Filled 2014-05-31: qty 2

## 2014-05-31 MED ORDER — VANCOMYCIN HCL IN DEXTROSE 1-5 GM/200ML-% IV SOLN
1000.0000 mg | Freq: Once | INTRAVENOUS | Status: AC
  Administered 2014-05-31: 1000 mg via INTRAVENOUS
  Filled 2014-05-31: qty 200

## 2014-05-31 NOTE — H&P (Signed)
PCP:   Noralee Space, MD   Chief Complaint:  fever  HPI: 80 yo female h/o htn, ckd comes in with one day of high fever over 102.  Denies any cough.  Has associated n/v a couple of times but no cp or abd pain.  She has redness to her lle c/w her usual cellulitis (last episode in [place]).  Denies any cough or dysuria.  Pt feels some better after some ivf in the ED.  Review of Systems:  Positive and negative as per HPI otherwise all other systems are negative  Past Medical History: Past Medical History  Diagnosis Date  . Hypertension   . Hypothyroidism   . Glaucoma   . Acute bronchitis   . Palpitations   . Unspecified venous (peripheral) insufficiency   . Pure hypercholesterolemia   . GERD (gastroesophageal reflux disease)   . Diverticulosis of colon (without mention of hemorrhage)   . Benign neoplasm of colon   . Renal insufficiency   . DJD (degenerative joint disease)   . Vitamin D deficiency disease   . Anxiety state, unspecified   . CKD (chronic kidney disease), stage III 10/06/2013   Past Surgical History  Procedure Laterality Date  . Cataract extraction    . Abdominal hysterectomy    . Breast biopsy      Benign  . Total knee arthroplasty  1999    right  . Total knee arthroplasty  11/2008    left - Dr Lorin Mercy  . Toe amputation  08/2009    Right second toe - Dr Beola Cord  . Ercp N/A 10/06/2013    Procedure: ENDOSCOPIC RETROGRADE CHOLANGIOPANCREATOGRAPHY (ERCP);  Surgeon: Ladene Artist, MD;  Location: [facility];  Service: Endoscopy;  Laterality: N/A;    Medications: Prior to Admission medications   Medication Sig Start Date End Date Taking? Authorizing Provider  acetaminophen (TYLENOL) 325 MG tablet Take 325 mg by mouth every 4 (four) hours as needed for mild pain or moderate pain.    Yes Historical Provider, MD  Ascorbic Acid (VITAMIN C) 500 MG tablet Take 500 mg by mouth daily.     Yes Historical Provider, MD  aspirin 81 MG tablet Take 81 mg by mouth daily.     Yes  Historical Provider, MD  Cholecalciferol (VITAMIN D) 1000 UNITS capsule Take 1,000 Units by mouth daily.     Yes Historical Provider, MD  hydrOXYzine (ATARAX/VISTARIL) 10 MG tablet Take 1-2 tablets by mouth every 6 hours as needed for itching 12/15/13  Yes Noralee Space, MD  levothyroxine (SYNTHROID, LEVOTHROID) 125 MCG tablet Take 62.5 mcg by mouth daily before breakfast.   Yes Historical Provider, MD  loratadine (CLARITIN) 10 MG tablet Take 10 mg by mouth daily.   Yes Historical Provider, MD  omeprazole (PRILOSEC) 20 MG capsule Take 20 mg by mouth daily.     Yes Historical Provider, MD  Polyethyl Glycol-Propyl Glycol (SYSTANE) 0.4-0.3 % SOLN Place 1 drop into both eyes 4 (four) times daily.    Yes Historical Provider, MD  timolol (TIMOPTIC) 0.5 % ophthalmic solution Place 1 drop into both eyes 2 (two) times daily.     Yes Historical Provider, MD  triamcinolone (KENALOG) 0.025 % cream Apply 1 application topically 2 (two) times daily.  09/11/12  Yes Historical Provider, MD    Allergies:   Allergies  Allergen Reactions  . Enablex [Darifenacin Hydrobromide Er] Other (See Comments)    Caused her throat and mouth to have bumps and feel like it was  swelling  . Clarithromycin Swelling    REACTION: causes her mouth to swell  . Codeine Nausea Only  . Morphine Nausea And Vomiting  . Sulfonamide Derivatives Other (See Comments)    REACTION: unsure of reaction    Social History:  reports that she has never smoked. She has never used smokeless tobacco. She reports that she does not drink alcohol or use illicit drugs.  Family History: Family History  Problem Relation Age of Onset  . Heart disease Mother   . Cancer Father     Throat  . Heart disease Father     Physical Exam: Filed Vitals:   05/31/14 2144 05/31/14 2145 05/31/14 2158 05/31/14 2230  BP:  92/45  104/43  Pulse:  84  86  Temp:   98.7 F (37.1 C)   TempSrc:   Oral   Resp: 20   20  SpO2:  97%  97%   General appearance:  alert, cooperative and no distress Head: Normocephalic, without obvious abnormality, atraumatic Eyes: negative Nose: Nares normal. Septum midline. Mucosa normal. No drainage or sinus tenderness. Neck: no JVD and supple, symmetrical, trachea midline Lungs: clear to auscultation bilaterally Heart: regular rate and rhythm, S1, S2 normal, no murmur, click, rub or gallop Abdomen: soft, non-tender; bowel sounds normal; no masses,  no organomegaly Extremities: extremities normal, atraumatic, no cyanosis or edema Pulses: 2+ and symmetric Skin: Skin color, texture, turgor normal. No rashes or lesions x lle cellulitis Neurologic: Grossly normal   Labs on Admission:   Recent Labs  05/31/14 1900  NA 139  K 3.5*  CL 95*  CO2 30  GLUCOSE 115*  BUN 34*  CREATININE 1.39*  CALCIUM 8.9    Recent Labs  05/31/14 1900  AST 29  ALT 18  ALKPHOS 75  BILITOT 0.4  PROT 6.4  ALBUMIN 3.2*    Recent Labs  05/31/14 1900  LIPASE 14    Recent Labs  05/31/14 1900  WBC 14.9*  NEUTROABS 14.1*  HGB 12.7  HCT 39.9  MCV 92.4  PLT 164   Radiological Exams on Admission: Dg Chest Portable 1 View  05/31/2014   CLINICAL DATA:  Nausea, emesis, chills, fever.  EXAM: PORTABLE CHEST - 1 VIEW  COMPARISON:  Chest radiograph 10/07/2013  FINDINGS: Cardiac leads project over the chest. Stable heart, mediastinal, and hilar contours. Low lung volumes. Slightly increased prominence of the interstitial lung markings appears similar to prior exam.  Focal opacity in the lingula was not present on the prior chest radiograph. Negative for pneumothorax. Bones appear osteopenic.  IMPRESSION: Small focal opacity in the lingula. Possibilities include focal atelectasis, small pleural effusion, or focal airspace disease related to pneumonia, given the clinical symptoms. Consider short-term follow-up two view chest radiograph if indicated.   Electronically Signed   By: Curlene Dolphin M.D.   On: 05/31/2014 19:37     Assessment/Plan  80 yo female with fever, early sepsis unclear source  Principal Problem:   Sepsis- source either cellulitis vs possible early pna.  Place on iv vanc/cefepime.  bp soft.  Better with several bolus ivf.  Proceed to pressors if needed.  Pt full code.   Blood cx pending.  ua neg.    Active Problems:  Stable unless o/w noted   RENAL INSUFFICIENCY   Aortic valve disorder   Cellulitis   CKD (chronic kidney disease), stage III-  Stable at baseline  Admit to stepdown.  Full code.  DAVID,RACHAL A 05/31/2014, 11:02 PM

## 2014-05-31 NOTE — ED Notes (Addendum)
I stat lactic acid results given to Dr. Kathrynn Humble by B. Yolanda Bonine, EMT

## 2014-05-31 NOTE — ED Notes (Signed)
Per EMS, pt has had N/V and chills since this am. No other complaints. EKG unremarkable. Pt had 4 mg zofran en route.

## 2014-05-31 NOTE — ED Provider Notes (Signed)
CSN: [id number]     Arrival date & time 05/31/14  1814 History   First MD Initiated Contact with Patient 05/31/14 1821     Chief Complaint  Patient presents with  . Nausea  . Emesis  . Chills     (Consider location/radiation/quality/duration/timing/severity/associated sxs/prior Treatment) HPI [name] is a 80 y.o. female with hx of htn, CKD, GERD, peripheral vascular disease, who presents to ED with complaint of nausea, vomiting, fever, generalized malaise. States she was feeling bad yesterday, today it worsened. Pt denies any complaints other than having nausea, vomiting, weakness, and left leg pain. States has hx of peripheral vascular disease and always has some discoloration to distal legs. Denies URI symptoms. No abdominal pain. No urinary symptoms. No diarrhea. No medications taken at home prior to coming in.   Past Medical History  Diagnosis Date  . Hypertension   . Hypothyroidism   . Glaucoma   . Acute bronchitis   . Palpitations   . Unspecified venous (peripheral) insufficiency   . Pure hypercholesterolemia   . GERD (gastroesophageal reflux disease)   . Diverticulosis of colon (without mention of hemorrhage)   . Benign neoplasm of colon   . Renal insufficiency   . DJD (degenerative joint disease)   . Vitamin D deficiency disease   . Anxiety state, unspecified   . CKD (chronic kidney disease), stage III 10/06/2013   Past Surgical History  Procedure Laterality Date  . Cataract extraction    . Abdominal hysterectomy    . Breast biopsy      Benign  . Total knee arthroplasty  1999    right  . Total knee arthroplasty  11/2008    left - Dr Lorin Mercy  . Toe amputation  08/2009    Right second toe - Dr Beola Cord  . Ercp N/A 10/06/2013    Procedure: ENDOSCOPIC RETROGRADE CHOLANGIOPANCREATOGRAPHY (ERCP);  Surgeon: Ladene Artist, MD;  Location: [facility];  Service: Endoscopy;  Laterality: N/A;   Family History  Problem Relation Age of Onset  . Heart disease Mother   .  Cancer Father     Throat  . Heart disease Father    History  Substance Use Topics  . Smoking status: Never Smoker   . Smokeless tobacco: Never Used  . Alcohol Use: No   OB History   Grav Para Term Preterm Abortions TAB SAB Ect Mult Living                 Review of Systems  Constitutional: Positive for fever and chills.  Respiratory: Negative for cough, chest tightness and shortness of breath.   Cardiovascular: Negative for chest pain, palpitations and leg swelling.  Gastrointestinal: Positive for nausea and vomiting. Negative for abdominal pain and diarrhea.  Genitourinary: Negative for dysuria and flank pain.  Musculoskeletal: Positive for arthralgias and myalgias. Negative for neck pain and neck stiffness.  Skin: Negative for rash.  Neurological: Positive for weakness. Negative for dizziness and headaches.  All other systems reviewed and are negative.     Allergies  Enablex; Clarithromycin; Codeine; Morphine; and Sulfonamide derivatives  Home Medications   Prior to Admission medications   Medication Sig Start Date End Date Taking? Authorizing Provider  acetaminophen (TYLENOL) 325 MG tablet Take 325 mg by mouth every 4 (four) hours as needed for mild pain or moderate pain.     Historical Provider, MD  ALPRAZolam (XANAX) 0.25 MG tablet TAKE 1/2 TO 1 TABLET 3 TIMES A DAY AS NEEDED  Noralee Space, MD  Ascorbic Acid (VITAMIN C) 500 MG tablet Take 500 mg by mouth daily.      Historical Provider, MD  aspirin 81 MG tablet Take 81 mg by mouth daily.      Historical Provider, MD  Cholecalciferol (VITAMIN D) 1000 UNITS capsule Take 1,000 Units by mouth daily.      Historical Provider, MD  diltiazem (CARDIZEM CD) 180 MG 24 hr capsule TAKE 1 CAPSULE DAILY 08/16/13   Noralee Space, MD  furosemide (LASIX) 40 MG tablet TAKE 2 TABLETS BY MOUTH ONCE EVERY MORNING 07/29/13   Noralee Space, MD  hydrOXYzine (ATARAX/VISTARIL) 10 MG tablet Take 1-2 tablets by mouth every 6 hours as needed for  itching 12/15/13   Noralee Space, MD  levothyroxine (SYNTHROID, LEVOTHROID) 125 MCG tablet Take 62.5 mcg by mouth daily before breakfast.    Historical Provider, MD  loratadine (CLARITIN) 10 MG tablet Take 10 mg by mouth daily.    Historical Provider, MD  omeprazole (PRILOSEC) 20 MG capsule Take 20 mg by mouth daily.      Historical Provider, MD  Polyethyl Glycol-Propyl Glycol (SYSTANE) 0.4-0.3 % SOLN Place 1 drop into both eyes 2 (two) times daily.     Historical Provider, MD  simvastatin (ZOCOR) 20 MG tablet TAKE 1 TAB BY MOUTH AT BEDTIME 06/28/13   Noralee Space, MD  timolol (TIMOPTIC) 0.5 % ophthalmic solution Place 1 drop into both eyes 2 (two) times daily.      Historical Provider, MD  traMADol (ULTRAM) 50 MG tablet TAKE 1 TABLET 3 TIMES A DAY AS NEEDED FOR PAIN    Noralee Space, MD  triamcinolone (KENALOG) 0.025 % cream Apply 1 application topically 2 (two) times daily.  09/11/12   Historical Provider, MD   BP 110/41  Pulse 108  Temp(Src) 103.2 F (39.6 C) (Rectal)  Resp 18  SpO2 98% Physical Exam  Nursing note and vitals reviewed. Constitutional: She appears well-developed and well-nourished. No distress.  HENT:  Head: Normocephalic.  Eyes: Conjunctivae are normal.  Neck: Neck supple.  Cardiovascular: Normal rate, regular rhythm and normal heart sounds.   Pulmonary/Chest: Effort normal and breath sounds normal. No respiratory distress. She has no wheezes. She has no rales.  Abdominal: Soft. Bowel sounds are normal. She exhibits no distension. There is no tenderness. There is no rebound.  Musculoskeletal: She exhibits tenderness. She exhibits no edema.  Erythema to left anterior shin, tender and warm to the touch. No significant swelling.  Neurological: She is alert.  Skin: Skin is warm and dry.  Psychiatric: She has a normal mood and affect. Her behavior is normal.    ED Course  Procedures (including critical care time) Labs Review Labs Reviewed  CBC WITH DIFFERENTIAL -  Abnormal; Notable for the following:    WBC 14.9 (*)    RDW 15.7 (*)    Neutrophils Relative % 95 (*)    Neutro Abs 14.1 (*)    Lymphocytes Relative 2 (*)    Lymphs Abs 0.4 (*)    All other components within normal limits  COMPREHENSIVE METABOLIC PANEL - Abnormal; Notable for the following:    Potassium 3.5 (*)    Chloride 95 (*)    Glucose, Bld 115 (*)    BUN 34 (*)    Creatinine, Ser 1.39 (*)    Albumin 3.2 (*)    GFR calc non Af Amer 31 (*)    GFR calc Af Amer 36 (*)  All other components within normal limits  CULTURE, BLOOD (ROUTINE X 2)  CULTURE, BLOOD (ROUTINE X 2)  URINALYSIS, ROUTINE W REFLEX MICROSCOPIC  LIPASE, BLOOD  I-STAT CG4 LACTIC ACID, ED    Imaging Review Dg Chest Portable 1 View  05/31/2014   CLINICAL DATA:  Nausea, emesis, chills, fever.  EXAM: PORTABLE CHEST - 1 VIEW  COMPARISON:  Chest radiograph 10/07/2013  FINDINGS: Cardiac leads project over the chest. Stable heart, mediastinal, and hilar contours. Low lung volumes. Slightly increased prominence of the interstitial lung markings appears similar to prior exam.  Focal opacity in the lingula was not present on the prior chest radiograph. Negative for pneumothorax. Bones appear osteopenic.  IMPRESSION: Small focal opacity in the lingula. Possibilities include focal atelectasis, small pleural effusion, or focal airspace disease related to pneumonia, given the clinical symptoms. Consider short-term follow-up two view chest radiograph if indicated.   Electronically Signed   By: Curlene Dolphin M.D.   On: 05/31/2014 19:37     EKG Interpretation None      MDM   Final diagnoses:  Sepsis, due to unspecified organism  Cellulitis of left leg    Pt with possible sepsis. She is febrile at 103, initially tachycardic, BP normal 99/65. HR responded quickly to tylenol and fluids. Labs pending including cultures.    11:10 PM Pt's BP began to drop to 81K systolic with map of 48.JE received 2L of IV fluids. BP now in  56D systolic. Confirmed full code. Suspect most likely source of her fever is left leg cellulitis. Pt with possible pneumonia on CXR. She has no symptoms of pneumonia, however covered with levaquin. UA clear.  She is receiving vanc for cellulitis. Pt continues to be alert. She is mentating well.   Code confirmed, she is FULL CODE.   Spoke with triad, will admit to step down.    Renold Genta, PA-C 05/31/14 2313

## 2014-05-31 NOTE — ED Notes (Signed)
Rectal Temp. 103.77F.

## 2014-05-31 NOTE — ED Notes (Signed)
Lab at bedside

## 2014-05-31 NOTE — ED Notes (Signed)
Tatyana, PA at bedside. 

## 2014-06-01 LAB — CBC
HCT: 37.2 % (ref 36.0–46.0)
Hemoglobin: 11.9 g/dL — ABNORMAL LOW (ref 12.0–15.0)
MCH: 29.4 pg (ref 26.0–34.0)
MCHC: 32 g/dL (ref 30.0–36.0)
MCV: 91.9 fL (ref 78.0–100.0)
PLATELETS: UNDETERMINED 10*3/uL (ref 150–400)
RBC: 4.05 MIL/uL (ref 3.87–5.11)
RDW: 15.9 % — AB (ref 11.5–15.5)
WBC: 25.7 10*3/uL — AB (ref 4.0–10.5)

## 2014-06-01 LAB — INFLUENZA PANEL BY PCR (TYPE A & B)
H1N1FLUPCR: NOT DETECTED
INFLAPCR: NEGATIVE
INFLBPCR: NEGATIVE

## 2014-06-01 LAB — BASIC METABOLIC PANEL
ANION GAP: 18 — AB (ref 5–15)
BUN: 32 mg/dL — ABNORMAL HIGH (ref 6–23)
CALCIUM: 7.9 mg/dL — AB (ref 8.4–10.5)
CO2: 21 mEq/L (ref 19–32)
Chloride: 100 mEq/L (ref 96–112)
Creatinine, Ser: 1.32 mg/dL — ABNORMAL HIGH (ref 0.50–1.10)
GFR, EST AFRICAN AMERICAN: 38 mL/min — AB (ref >90)
GFR, EST NON AFRICAN AMERICAN: 33 mL/min — AB (ref >90)
Glucose, Bld: 92 mg/dL (ref 70–99)
POTASSIUM: 3.8 meq/L (ref 3.7–5.3)
Sodium: 139 mEq/L (ref 137–147)

## 2014-06-01 LAB — MRSA PCR SCREENING: MRSA BY PCR: NEGATIVE

## 2014-06-01 MED ORDER — LEVOTHYROXINE SODIUM 125 MCG PO TABS
62.5000 ug | ORAL_TABLET | Freq: Every day | ORAL | Status: DC
  Administered 2014-06-01 – 2014-06-07 (×7): 62.5 ug via ORAL
  Filled 2014-06-01 (×9): qty 0.5

## 2014-06-01 MED ORDER — VANCOMYCIN HCL 500 MG IV SOLR
500.0000 mg | INTRAVENOUS | Status: DC
  Administered 2014-06-01 – 2014-06-04 (×4): 500 mg via INTRAVENOUS
  Filled 2014-06-01 (×5): qty 500

## 2014-06-01 MED ORDER — TRAMADOL HCL 50 MG PO TABS
50.0000 mg | ORAL_TABLET | Freq: Four times a day (QID) | ORAL | Status: DC | PRN
  Administered 2014-06-01 – 2014-06-06 (×8): 50 mg via ORAL
  Filled 2014-06-01 (×8): qty 1

## 2014-06-01 MED ORDER — HYDROXYZINE HCL 10 MG PO TABS
10.0000 mg | ORAL_TABLET | Freq: Three times a day (TID) | ORAL | Status: DC | PRN
  Filled 2014-06-01: qty 1

## 2014-06-01 MED ORDER — ONDANSETRON HCL 4 MG/2ML IJ SOLN: 4.0000 mg | Freq: Three times a day (TID) | INTRAMUSCULAR | Status: AC | PRN

## 2014-06-01 MED ORDER — VANCOMYCIN HCL IN DEXTROSE 750-5 MG/150ML-% IV SOLN: 750.0000 mg | INTRAVENOUS | Status: DC

## 2014-06-01 MED ORDER — ASPIRIN 81 MG PO CHEW
81.0000 mg | CHEWABLE_TABLET | Freq: Every day | ORAL | Status: DC
  Administered 2014-06-01 – 2014-06-07 (×7): 81 mg via ORAL
  Filled 2014-06-01 (×7): qty 1

## 2014-06-01 MED ORDER — POTASSIUM CHLORIDE IN NACL 20-0.9 MEQ/L-% IV SOLN
INTRAVENOUS | Status: DC
  Administered 2014-06-01: 08:00:00 via INTRAVENOUS
  Filled 2014-06-01: qty 1000

## 2014-06-01 MED ORDER — ENSURE COMPLETE PO LIQD
237.0000 mL | Freq: Two times a day (BID) | ORAL | Status: DC
  Administered 2014-06-01 – 2014-06-07 (×12): 237 mL via ORAL

## 2014-06-01 MED ORDER — INFLUENZA VAC SPLIT QUAD 0.5 ML IM SUSY
0.5000 mL | PREFILLED_SYRINGE | INTRAMUSCULAR | Status: AC
  Administered 2014-06-02: 0.5 mL via INTRAMUSCULAR
  Filled 2014-06-01: qty 0.5

## 2014-06-01 MED ORDER — ONDANSETRON HCL 4 MG PO TABS: 4.0000 mg | ORAL_TABLET | Freq: Four times a day (QID) | ORAL | Status: DC | PRN

## 2014-06-01 MED ORDER — ENOXAPARIN SODIUM 30 MG/0.3ML ~~LOC~~ SOLN
30.0000 mg | SUBCUTANEOUS | Status: DC
  Administered 2014-06-01: 30 mg via SUBCUTANEOUS
  Filled 2014-06-01 (×2): qty 0.3

## 2014-06-01 MED ORDER — DEXTROSE 5 % IV SOLN
1.0000 g | INTRAVENOUS | Status: DC
  Administered 2014-06-01 – 2014-06-02 (×2): 1 g via INTRAVENOUS
  Filled 2014-06-01 (×5): qty 1

## 2014-06-01 MED ORDER — ONDANSETRON HCL 4 MG/2ML IJ SOLN: 4.0000 mg | Freq: Four times a day (QID) | INTRAMUSCULAR | Status: DC | PRN

## 2014-06-01 MED ORDER — SODIUM CHLORIDE 0.9 % IV SOLN
INTRAVENOUS | Status: DC
  Administered 2014-06-01 – 2014-06-02 (×2): via INTRAVENOUS

## 2014-06-01 MED ORDER — SODIUM CHLORIDE 0.9 % IV SOLN
INTRAVENOUS | Status: DC
  Administered 2014-06-01: via INTRAVENOUS

## 2014-06-01 MED ORDER — TIMOLOL MALEATE 0.5 % OP SOLN
1.0000 [drp] | Freq: Two times a day (BID) | OPHTHALMIC | Status: DC
  Administered 2014-06-01 – 2014-06-07 (×14): 1 [drp] via OPHTHALMIC
  Filled 2014-06-01: qty 5

## 2014-06-01 NOTE — Progress Notes (Signed)
Pt transferred to 5W13 per order via bed. Family at bedside and aware of transfer, personal belongings/meds taken with pt at time of transfer to new room. CCMD notified of new room assignment, report called to 5W RN. Pt stable at time of transfer, all questions answered

## 2014-06-01 NOTE — Progress Notes (Signed)
Utilization review completed.  

## 2014-06-01 NOTE — Progress Notes (Signed)
[place] TEAM 1 - Stepdown/ICU TEAM Progress Note  [name] RKY:[id number] DOB: 1915/02/02 DOA: 05/31/2014 PCP: Noralee Space, MD  Admit HPI / Brief Narrative: 80 yo female w/ h/o htn and ckd who presented with a one day hx of high fever over 102, w/ associated n/v a couple of times but no cp or abd pain. She had redness to her lle c/w her usual cellulitis (last episode in Jan).  HPI/Subjective: Pt c/o feeling "crumby in general, and a bit nauseated."  Denies cp, sob, vomiting, diarrhea, or abdom pain.  Assessment/Plan:  L LE cellulitis w/ Severe Sepsis (WBC 14.9 / Temp 103.2) Hypotension resolving w/ volume expansion, but WBC climbing - cont empiric abx - consider CT of limb to r/o abscess if pt does not soon improve clinically - no localizing sx to suggest other sources of infection   Recurring L LE cellulitis w/ venous insufficiency R/o DVT w/ venous duplex   CKD stage III Renal function improving w/ volume   Hypothyroid Cont home synthroid dose   Code Status: FULL Family Communication: spoke w/ pt and daughter at bedside  Disposition Plan: transfer to medical bed  Consultants: none  Procedures: L LE venous duplex - pending   Antibiotics: Cefepime 9/8 > vanc 9/8 >  DVT prophylaxis: lovenox  Objective: Blood pressure 109/51, pulse 87, temperature 98.5 F (36.9 C), temperature source Oral, resp. rate 14, height 4' 11.84" (1.52 m), weight 62 kg (136 lb 11 oz), SpO2 97.00%.  Intake/Output Summary (Last 24 hours) at 06/01/14 1543 Last data filed at 06/01/14 1500  Gross per 24 hour  Intake    950 ml  Output    300 ml  Net    650 ml   Exam: General: No acute respiratory distress Lungs: Clear to auscultation bilaterally without wheezes or crackles Cardiovascular: Regular rate and rhythm without murmur gallop or rub normal S1 and S2 Abdomen: Nontender, nondistended, soft, bowel sounds positive, no rebound, no ascites, no appreciable mass Extremities: L LE  erythematous and warm to touch in circumferential fashion from knee to ankle w/o apparent wound/ulceration - R LE w/o cyanosis or edema   Data Reviewed: Basic Metabolic Panel:  Recent Labs Lab 05/31/14 1900 06/01/14 0412  NA 139 139  K 3.5* 3.8  CL 95* 100  CO2 30 21  GLUCOSE 115* 92  BUN 34* 32*  CREATININE 1.39* 1.32*  CALCIUM 8.9 7.9*   Liver Function Tests:  Recent Labs Lab 05/31/14 1900  AST 29  ALT 18  ALKPHOS 75  BILITOT 0.4  PROT 6.4  ALBUMIN 3.2*    Recent Labs Lab 05/31/14 1900  LIPASE 14   CBC:  Recent Labs Lab 05/31/14 1900 06/01/14 0412  WBC 14.9* 25.7*  NEUTROABS 14.1*  --   HGB 12.7 11.9*  HCT 39.9 37.2  MCV 92.4 91.9  PLT 164 PLATELET CLUMPS NOTED ON SMEAR, UNABLE TO ESTIMATE    Recent Results (from the past 240 hour(s))  MRSA PCR SCREENING     Status: None   Collection Time    06/01/14  2:03 AM      Result Value Ref Range Status   MRSA by PCR NEGATIVE  NEGATIVE Final   Comment:            The GeneXpert MRSA Assay (FDA     approved for NASAL specimens     only), is one component of a     comprehensive MRSA colonization     surveillance program. It is  not     intended to diagnose MRSA     infection nor to guide or     monitor treatment for     MRSA infections.    Studies:  Recent x-ray studies have been reviewed in detail by the Attending Physician  Scheduled Meds:  Scheduled Meds: . aspirin  81 mg Oral Daily  . ceFEPime (MAXIPIME) IV  1 g Intravenous Q24H  . enoxaparin (LOVENOX) injection  30 mg Subcutaneous Q24H  . feeding supplement (ENSURE COMPLETE)  237 mL Oral BID BM  . [START ON 06/02/2014] Influenza vac split quadrivalent PF  0.5 mL Intramuscular Tomorrow-1000  . levothyroxine  62.5 mcg Oral QAC breakfast  . timolol  1 drop Both Eyes BID  . vancomycin  500 mg Intravenous Q24H    Time spent on care of this patient: 35 mins   Sussan Meter T , MD   [facility] Office  [phone] Pager - Text Page  per Shea Evans as per below:  On-Call/Text Page:      [email or web address]      password [email or web address]  If 7PM-7AM, please contact night-coverage [email or web address] Password [email or web address] 06/01/2014, 3:43 PM   LOS: 1 day

## 2014-06-01 NOTE — Progress Notes (Signed)
INITIAL NUTRITION ASSESSMENT  DOCUMENTATION CODES Per approved criteria  -Non-severe (moderate) malnutrition in the context of chronic illness   INTERVENTION: Ensure Complete po BID, each supplement provides 350 kcal and 13 grams of protein  NUTRITION DIAGNOSIS: Malnutrition related to inadequate oral intake as evidenced by intake < 75% of estimated energy requirement for > 1 month and mild depletion of muscle mass.   Goal: Intake to meet >90% of estimated nutrition needs.  Monitor:  PO intake, labs, weight trend.  Reason for Assessment: MST  80 y.o. female  Admitting Dx: Sepsis  ASSESSMENT: 80 yo female h/o htn, ckd admitted on 9/8 with one day of high fever over 102 with n/v a couple of times. She has redness to her lle c/w her usual cellulitis.   Patient with some weight loss over the past 8-10 months, but amount is insignificant. C/o poor appetite and poor intake over the past 6-10 months. Patient reports that she was unable to eat her eggs today at breakfast. Poor appetite persists. She has never tried Ensure supplements, but is willing to try them.   Nutrition Focused Physical Exam:  Subcutaneous Fat:  Orbital Region: WNL Upper Arm Region: WNL Thoracic and Lumbar Region: WNL  Muscle:  Temple Region: mild depletion Clavicle Bone Region: mild depletion Clavicle and Acromion Bone Region: mild depletion Scapular Bone Region: WNL Dorsal Hand: moderate dpeletion Patellar Region: WNL Anterior Thigh Region: WNL Posterior Calf Region: mild depletion  Edema: none  Pt meets criteria for non-severe (moderate) MALNUTRITION in the context of chronic illness as evidenced by intake < 75% of estimated energy requirement for > 1 month and mild depletion of muscle mass.   Height: Ht Readings from Last 1 Encounters:  06/01/14 4' 11.84" (1.52 m)    Weight: Wt Readings from Last 1 Encounters:  06/01/14 136 lb 11 oz (62 kg)    Ideal Body Weight: 45.5 kg  % Ideal Body  Weight: 136%  Wt Readings from Last 10 Encounters:  06/01/14 136 lb 11 oz (62 kg)  12/15/13 136 lb 9.6 oz (61.961 kg)  10/26/13 146 lb 6.4 oz (66.407 kg)  10/25/13 146 lb 3.2 oz (66.316 kg)  10/11/13 148 lb 13 oz (67.5 kg)  10/11/13 148 lb 13 oz (67.5 kg)  08/16/13 148 lb 12.8 oz (67.495 kg)  04/21/13 140 lb 9.6 oz (63.776 kg)  12/29/12 150 lb (68.04 kg)  12/21/12 151 lb (68.493 kg)    Usual Body Weight: 148 lb (8 months ago)  % Usual Body Weight: 92%  BMI:  Body mass index is 26.84 kg/(m^2).  Estimated Nutritional Needs: Kcal: 1500-1600 Protein: 75-90 gm Fluid: 1.5-1.6 L  Skin: no issues  Diet Order: Cardiac  EDUCATION NEEDS: -Education needs addressed   Intake/Output Summary (Last 24 hours) at 06/01/14 1053 Last data filed at 06/01/14 0900  Gross per 24 hour  Intake    200 ml  Output    250 ml  Net    -50 ml    Last BM: 9/7   Labs:   Recent Labs Lab 05/31/14 1900 06/01/14 0412  NA 139 139  K 3.5* 3.8  CL 95* 100  CO2 30 21  BUN 34* 32*  CREATININE 1.39* 1.32*  CALCIUM 8.9 7.9*  GLUCOSE 115* 92    CBG (last 3)  No results found for this basename: GLUCAP,  in the last 72 hours  Scheduled Meds: . aspirin  81 mg Oral Daily  . ceFEPime (MAXIPIME) IV  1 g Intravenous Q24H  .  enoxaparin (LOVENOX) injection  30 mg Subcutaneous Q24H  . [START ON 06/02/2014] Influenza vac split quadrivalent PF  0.5 mL Intramuscular Tomorrow-1000  . levothyroxine  62.5 mcg Oral QAC breakfast  . timolol  1 drop Both Eyes BID  . vancomycin  500 mg Intravenous Q24H    Continuous Infusions: . sodium chloride 125 mL/hr at 06/01/14 0830    Past Medical History  Diagnosis Date  . Hypertension   . Hypothyroidism   . Glaucoma   . Acute bronchitis   . Palpitations   . Unspecified venous (peripheral) insufficiency   . Pure hypercholesterolemia   . GERD (gastroesophageal reflux disease)   . Diverticulosis of colon (without mention of hemorrhage)   . Benign neoplasm  of colon   . Renal insufficiency   . DJD (degenerative joint disease)   . Vitamin D deficiency disease   . Anxiety state, unspecified   . CKD (chronic kidney disease), stage III 10/06/2013    Past Surgical History  Procedure Laterality Date  . Cataract extraction    . Abdominal hysterectomy    . Breast biopsy      Benign  . Total knee arthroplasty  1999    right  . Total knee arthroplasty  11/2008    left - Dr Lorin Mercy  . Toe amputation  08/2009    Right second toe - Dr Beola Cord  . Ercp N/A 10/06/2013    Procedure: ENDOSCOPIC RETROGRADE CHOLANGIOPANCREATOGRAPHY (ERCP);  Surgeon: Ladene Artist, MD;  Location: [facility];  Service: Endoscopy;  Laterality: N/A;    Molli Barrows, RD, LDN, [place] Pager [phone] After Hours Pager [phone]

## 2014-06-01 NOTE — Progress Notes (Addendum)
ANTIBIOTIC CONSULT NOTE - INITIAL  Pharmacy Consult for Vancomycin Indication: rule out sepsis  Allergies  Allergen Reactions  . Enablex [Darifenacin Hydrobromide Er] Other (See Comments)    Caused her throat and mouth to have bumps and feel like it was swelling  . Clarithromycin Swelling    REACTION: causes her mouth to swell  . Codeine Nausea Only  . Morphine Nausea And Vomiting  . Sulfonamide Derivatives Other (See Comments)    REACTION: unsure of reaction    Patient Measurements: Height: 4' 11.84" (152 cm) Weight: 136 lb 11 oz (62 kg) (11/2013) IBW/kg (Calculated) : 45.14  Vital Signs: Temp: 98.3 F (36.8 C) (09/09 0046) Temp src: Oral (09/09 0046) BP: 103/81 mmHg (09/09 0046) Pulse Rate: 109 (09/09 0046) Intake/Output from previous day:   Intake/Output from this shift:    Labs:  Recent Labs  05/31/14 1900  WBC 14.9*  HGB 12.7  PLT 164  CREATININE 1.39*   Estimated Creatinine Clearance: 19 ml/min (by C-G formula based on Cr of 1.39). No results found for this basename: VANCOTROUGH, VANCOPEAK, VANCORANDOM, GENTTROUGH, GENTPEAK, GENTRANDOM, TOBRATROUGH, TOBRAPEAK, TOBRARND, AMIKACINPEAK, AMIKACINTROU, AMIKACIN,  in the last 72 hours   Microbiology: No results found for this or any previous visit (from the past 720 hour(s)).  Medical History: Past Medical History  Diagnosis Date  . Hypertension   . Hypothyroidism   . Glaucoma   . Acute bronchitis   . Palpitations   . Unspecified venous (peripheral) insufficiency   . Pure hypercholesterolemia   . GERD (gastroesophageal reflux disease)   . Diverticulosis of colon (without mention of hemorrhage)   . Benign neoplasm of colon   . Renal insufficiency   . DJD (degenerative joint disease)   . Vitamin D deficiency disease   . Anxiety state, unspecified   . CKD (chronic kidney disease), stage III 10/06/2013    Medications:  Prescriptions prior to admission  Medication Sig Dispense Refill  . acetaminophen  (TYLENOL) 325 MG tablet Take 325 mg by mouth every 4 (four) hours as needed for mild pain or moderate pain.       . Ascorbic Acid (VITAMIN C) 500 MG tablet Take 500 mg by mouth daily.        [name] aspirin 81 MG tablet Take 81 mg by mouth daily.        . Cholecalciferol (VITAMIN D) 1000 UNITS capsule Take 1,000 Units by mouth daily.        . hydrOXYzine (ATARAX/VISTARIL) 10 MG tablet Take 1-2 tablets by mouth every 6 hours as needed for itching  50 tablet  1  . levothyroxine (SYNTHROID, LEVOTHROID) 125 MCG tablet Take 62.5 mcg by mouth daily before breakfast.      . loratadine (CLARITIN) 10 MG tablet Take 10 mg by mouth daily.      [name] omeprazole (PRILOSEC) 20 MG capsule Take 20 mg by mouth daily.        Vladimir Faster Glycol-Propyl Glycol (SYSTANE) 0.4-0.3 % SOLN Place 1 drop into both eyes 4 (four) times daily.       . timolol (TIMOPTIC) 0.5 % ophthalmic solution Place 1 drop into both eyes 2 (two) times daily.        [name] triamcinolone (KENALOG) 0.025 % cream Apply 1 application topically 2 (two) times daily.        Assessment: 80 yo female with fevers/sepsis for empiric antibiotics.  Vancomycin 1 g IV given in ED at 2120  Goal of Therapy:  Vancomycin trough level 15-20 mcg/ml  Plan:  Vancomycin 750 mg IV q48h Change Cefepime 1 g IV q24h  Abbott, Bronson Curb 06/01/2014,1:15 AM    ADDENDUM Labs drawn this morning with significant increase in WBC to 25.7. Patient's weight and renal function are on the border of q24h vs q48h dosing.   Plan: 1. Change vancomycin to 500mg  IV q24h starting tonight at 2200 2. Continue cefepime 1g IV q24h 3. Follow c/s, clinical progression, renal function, trough at [place] D. Anara Cowman, PharmD, BCPS Clinical Pharmacist Pager: [phone] 06/01/2014 10:18 AM \

## 2014-06-02 DIAGNOSIS — M79609 Pain in unspecified limb: Secondary | ICD-10-CM

## 2014-06-02 DIAGNOSIS — E039 Hypothyroidism, unspecified: Secondary | ICD-10-CM

## 2014-06-02 DIAGNOSIS — M7989 Other specified soft tissue disorders: Secondary | ICD-10-CM

## 2014-06-02 DIAGNOSIS — A419 Sepsis, unspecified organism: Secondary | ICD-10-CM | POA: Diagnosis not present

## 2014-06-02 LAB — CBC
HCT: 35.5 % — ABNORMAL LOW (ref 36.0–46.0)
Hemoglobin: 11.2 g/dL — ABNORMAL LOW (ref 12.0–15.0)
MCH: 29.2 pg (ref 26.0–34.0)
MCHC: 31.5 g/dL (ref 30.0–36.0)
MCV: 92.7 fL (ref 78.0–100.0)
PLATELETS: DECREASED 10*3/uL (ref 150–400)
RBC: 3.83 MIL/uL — ABNORMAL LOW (ref 3.87–5.11)
RDW: 16.6 % — AB (ref 11.5–15.5)
WBC: 29.1 10*3/uL — AB (ref 4.0–10.5)

## 2014-06-02 LAB — BASIC METABOLIC PANEL
ANION GAP: 16 — AB (ref 5–15)
BUN: 39 mg/dL — ABNORMAL HIGH (ref 6–23)
CO2: 21 mEq/L (ref 19–32)
Calcium: 8.2 mg/dL — ABNORMAL LOW (ref 8.4–10.5)
Chloride: 102 mEq/L (ref 96–112)
Creatinine, Ser: 1.29 mg/dL — ABNORMAL HIGH (ref 0.50–1.10)
GFR calc non Af Amer: 34 mL/min — ABNORMAL LOW (ref >90)
GFR, EST AFRICAN AMERICAN: 39 mL/min — AB (ref >90)
Glucose, Bld: 64 mg/dL — ABNORMAL LOW (ref 70–99)
POTASSIUM: 4.2 meq/L (ref 3.7–5.3)
Sodium: 139 mEq/L (ref 137–147)

## 2014-06-02 MED ORDER — HEPARIN SODIUM (PORCINE) 5000 UNIT/ML IJ SOLN
5000.0000 [IU] | Freq: Three times a day (TID) | INTRAMUSCULAR | Status: DC
  Administered 2014-06-02 – 2014-06-07 (×16): 5000 [IU] via SUBCUTANEOUS
  Filled 2014-06-02 (×17): qty 1

## 2014-06-02 NOTE — Progress Notes (Signed)
Pt BP 124/38. Pt is assympotmatic. NP on call notified. No new orders given.

## 2014-06-02 NOTE — Progress Notes (Signed)
PATIENT DETAILS Name: [name] Age: 80 y.o. Sex: female Date of Birth: [birth year] Admit Date: 05/31/2014 Admitting Physician Phillips Grout, MD QIW:LNLGX,QJJHE M, MD  Subjective: No major complaints-feels that she is getting better  Assessment/Plan: Sepsis -Sepsis pathophysiology resolved-BP stable, clinically non toxic looking-however leukocytosis persists -suspect likely secondary to LLE Cellulitis. No other foci of infection apparent-UA neg for UTI, CXR ?PNA-but no symptoms-repeat 2 view CXR. -Blood culture on 9/8 neg, will continue with empiric Vanco/Cefepime-day 3  Left Lower Ext Cellulitis -per patient-less erythematous-but still tender in the calf area-no fluctuant areas seen-clinically improved, but leukocytosis worsening, recheck in am-if leukocytosis still persistent-will check MRI to make sure no abscess -Doppler neg for DVT  CKD stage III  -Renal function improving w/ volume -creatinine now close to usual baseline. Saline lock-follow lytes  Hypothyroidism -c/w Levothyroxine  Moderate Malnutrition -c/w Supplements  Disposition: Remain inpatient  DVT Prophylaxis: Prophylactic Heparin  Code Status: Full code  Family Communication None at bedside  Procedures:  None  CONSULTS:  None  Time spent 40 minutes-which includes 50% of the time with face-to-face with patient/ family and coordinating care related to the above assessment and plan.    MEDICATIONS: Scheduled Meds: . aspirin  81 mg Oral Daily  . ceFEPime (MAXIPIME) IV  1 g Intravenous Q24H  . feeding supplement (ENSURE COMPLETE)  237 mL Oral BID BM  . levothyroxine  62.5 mcg Oral QAC breakfast  . timolol  1 drop Both Eyes BID  . vancomycin  500 mg Intravenous Q24H   Continuous Infusions: . sodium chloride 75 mL/hr at 06/02/14 0737   PRN Meds:.acetaminophen, hydrOXYzine, ondansetron (ZOFRAN) IV, ondansetron, traMADol  Antibiotics: Anti-infectives   Start     Dose/Rate  Route Frequency Ordered Stop   06/03/14 0600  vancomycin (VANCOCIN) IVPB 750 mg/150 ml premix  Status:  Discontinued     750 mg 150 mL/hr over 60 Minutes Intravenous Every 48 hours 06/01/14 0120 06/01/14 1013   06/01/14 2200  ceFEPIme (MAXIPIME) 1 g in dextrose 5 % 50 mL IVPB     1 g 100 mL/hr over 30 Minutes Intravenous Every 24 hours 06/01/14 0120     06/01/14 2200  vancomycin (VANCOCIN) 500 mg in sodium chloride 0.9 % 100 mL IVPB     500 mg 100 mL/hr over 60 Minutes Intravenous Every 24 hours 06/01/14 1013     05/31/14 2200  ceFEPIme (MAXIPIME) 1 g in dextrose 5 % 50 mL IVPB  Status:  Discontinued     1 g 100 mL/hr over 30 Minutes Intravenous Every 12 hours 05/31/14 2103 06/01/14 0120   05/31/14 2100  levofloxacin (LEVAQUIN) IVPB 750 mg  Status:  Discontinued     750 mg 100 mL/hr over 90 Minutes Intravenous  Once 05/31/14 2050 05/31/14 2103   05/31/14 2000  vancomycin (VANCOCIN) IVPB 1000 mg/200 mL premix     1,000 mg 200 mL/hr over 60 Minutes Intravenous  Once 05/31/14 1949 05/31/14 2215       PHYSICAL EXAM: Vital signs in last 24 hours: Filed Vitals:   06/01/14 2207 06/01/14 2323 06/02/14 0439 06/02/14 0612  BP: 116/54   124/38  Pulse: 99   96  Temp: 100 F (37.8 C) 100.3 F (37.9 C)  98 F (36.7 C)  TempSrc: Oral   Oral  Resp: 18   18  Height: 5\' 4"  (1.626 m)     Weight: 62.6 kg (138 lb 0.1 oz)  62.6 kg (  138 lb 0.1 oz)   SpO2: 92%   92%    Weight change: 0.6 kg (1 lb 5.2 oz) Filed Weights   06/01/14 0100 06/01/14 2207 06/02/14 0439  Weight: 62 kg (136 lb 11 oz) 62.6 kg (138 lb 0.1 oz) 62.6 kg (138 lb 0.1 oz)   Body mass index is 23.68 kg/(m^2).   Gen Exam: Awake and alert with clear speech.   Neck: Supple, No JVD.   Chest: B/L Clear.   CVS: S1 S2 Regular, no murmurs.  Abdomen: soft, BS +, non tender, non distended.  Extremities: no edema, L LE erythematous and warm to touch in circumferential fashion from knee to ankle w/o apparent wound/ulceration - R LE w/o  cyanosis or edema  Neurologic: Non Focal.   Skin: No Rash.   Wounds: N/A.    Intake/Output from previous day:  Intake/Output Summary (Last 24 hours) at 06/02/14 1317 Last data filed at 06/02/14 0439  Gross per 24 hour  Intake    575 ml  Output    451 ml  Net    124 ml     LAB RESULTS: CBC  Recent Labs Lab 05/31/14 1900 06/01/14 0412 06/02/14 0825  WBC 14.9* 25.7* 29.1*  HGB 12.7 11.9* 11.2*  HCT 39.9 37.2 35.5*  PLT 164 PLATELET CLUMPS NOTED ON SMEAR, UNABLE TO ESTIMATE PLATELET CLUMPS NOTED ON SMEAR, COUNT APPEARS DECREASED  MCV 92.4 91.9 92.7  MCH 29.4 29.4 29.2  MCHC 31.8 32.0 31.5  RDW 15.7* 15.9* 16.6*  LYMPHSABS 0.4*  --   --   MONOABS 0.4  --   --   EOSABS 0.0  --   --   BASOSABS 0.0  --   --     Chemistries   Recent Labs Lab 05/31/14 1900 06/01/14 0412 06/02/14 0825  NA 139 139 139  K 3.5* 3.8 4.2  CL 95* 100 102  CO2 30 21 21   GLUCOSE 115* 92 64*  BUN 34* 32* 39*  CREATININE 1.39* 1.32* 1.29*  CALCIUM 8.9 7.9* 8.2*    CBG: No results found for this basename: GLUCAP,  in the last 168 hours  GFR Estimated Creatinine Clearance: 21.5 ml/min (by C-G formula based on Cr of 1.29).  Coagulation profile No results found for this basename: INR, PROTIME,  in the last 168 hours  Cardiac Enzymes No results found for this basename: CK, CKMB, TROPONINI, MYOGLOBIN,  in the last 168 hours  No components found with this basename: POCBNP,  No results found for this basename: DDIMER,  in the last 72 hours No results found for this basename: HGBA1C,  in the last 72 hours No results found for this basename: CHOL, HDL, LDLCALC, TRIG, CHOLHDL, LDLDIRECT,  in the last 72 hours No results found for this basename: TSH, T4TOTAL, FREET3, T3FREE, THYROIDAB,  in the last 72 hours No results found for this basename: VITAMINB12, FOLATE, FERRITIN, TIBC, IRON, RETICCTPCT,  in the last 72 hours  Recent Labs  05/31/14 1900  LIPASE 14    Urine Studies No results  found for this basename: UACOL, UAPR, USPG, UPH, UTP, UGL, UKET, UBIL, UHGB, UNIT, UROB, ULEU, UEPI, UWBC, URBC, UBAC, CAST, CRYS, UCOM, BILUA,  in the last 72 hours  MICROBIOLOGY: Recent Results (from the past 240 hour(s))  CULTURE, BLOOD (ROUTINE X 2)     Status: None   Collection Time    05/31/14  9:01 PM      Result Value Ref Range Status   Specimen Description BLOOD  ARM LEFT   Final   Special Requests BOTTLES DRAWN AEROBIC AND ANAEROBIC 10CC   Final   Culture  Setup Time     Final   Value: 06/01/2014 01:52     Performed at [facility]   Culture     Final   Value:        BLOOD CULTURE RECEIVED NO GROWTH TO DATE CULTURE WILL BE HELD FOR 5 DAYS BEFORE ISSUING A FINAL NEGATIVE REPORT     Performed at [facility]   Report Status PENDING   Incomplete  MRSA PCR SCREENING     Status: None   Collection Time    06/01/14  2:03 AM      Result Value Ref Range Status   MRSA by PCR NEGATIVE  NEGATIVE Final   Comment:            The GeneXpert MRSA Assay (FDA     approved for NASAL specimens     only), is one component of a     comprehensive MRSA colonization     surveillance program. It is not     intended to diagnose MRSA     infection nor to guide or     monitor treatment for     MRSA infections.    RADIOLOGY STUDIES/RESULTS: Dg Chest Portable 1 View  05/31/2014   CLINICAL DATA:  Nausea, emesis, chills, fever.  EXAM: PORTABLE CHEST - 1 VIEW  COMPARISON:  Chest radiograph 10/07/2013  FINDINGS: Cardiac leads project over the chest. Stable heart, mediastinal, and hilar contours. Low lung volumes. Slightly increased prominence of the interstitial lung markings appears similar to prior exam.  Focal opacity in the lingula was not present on the prior chest radiograph. Negative for pneumothorax. Bones appear osteopenic.  IMPRESSION: Small focal opacity in the lingula. Possibilities include focal atelectasis, small pleural effusion, or focal airspace disease related to pneumonia,  given the clinical symptoms. Consider short-term follow-up two view chest radiograph if indicated.   Electronically Signed   By: Curlene Dolphin M.D.   On: 05/31/2014 19:37    Oren Binet, MD  [facility] Pager:[phone]  If 7PM-7AM, please contact night-coverage [email or web address] Password [email or web address] 06/02/2014, 1:17 PM   LOS: 2 days   **Disclaimer: This note may have been dictated with voice recognition software. Similar sounding words can inadvertently be transcribed and this note may contain transcription errors which may not have been corrected upon publication of note.**

## 2014-06-02 NOTE — Evaluation (Signed)
Physical Therapy Evaluation Patient Details Name: [name] MRN: [id number] DOB: [birth year] Today's Date: 06/02/2014   History of Present Illness  Patient is a 80 y/o highly functional female who lives at home and admitted with 1 day of high fever of 102, N/V and redness to LLE consistent with cellulitis (last episode in January 2015). DVT ruled out.    Clinical Impression  Patient presents with functional limitations due to deficits listed in PT problem list (see below). Pt with generalized weakness, pain in LLE and balance deficits limiting safe mobility. Pt highly functional PTA and ambulating with SPC for support. Pt and daughter adamant about returning home at discharge. Recommend use of RW for all mobility to maintain safety. Daughter reports family member can stay at home with pt for short period. Pt would benefit from acute PT for improvement in transfers, gait, balance and overall functional mobility so pt can maximize independence, reduce risk of fall and return to PLOF.    Follow Up Recommendations Home health PT;Supervision/Assistance - 24 hour    Equipment Recommendations  None recommended by PT    Recommendations for Other Services       Precautions / Restrictions Precautions Precautions: Fall Restrictions Weight Bearing Restrictions: No      Mobility  Bed Mobility Overal bed mobility: Needs Assistance Bed Mobility: Supine to Sit     Supine to sit: [facility] elevated;Min assist     General bed mobility comments: use of rail. VC for hand placement and technique. Increased time.  Transfers Overall transfer level: Needs assistance Equipment used: Rolling walker (2 wheeled);None Transfers: Sit to/from [employer] to Stand: Min guard Stand pivot transfers: Min assist       General transfer comment: VC's for hand placement and anterior translation upon standing. Unsteady initially due to pain through LLE with weightbearing. SPT bed <->  chair Min A for balance and safety.  Ambulation/Gait Ambulation/Gait assistance: Min guard Ambulation Distance (Feet): 120 Feet Assistive device: Rolling walker (2 wheeled) Gait Pattern/deviations: Step-to pattern;Decreased stride length;Decreased stance time - left;Decreased step length - right;Trunk flexed;Antalgic Gait velocity: .6 ft/sec Gait velocity interpretation: <1.8 ft/sec, indicative of risk for recurrent falls General Gait Details: Patient with antalgic gait pattern secondary to pain through LLE with weight bearing. VC for posture and RW management. 1 standing rest break due to adjusting socks. No SOB present. Vitals stable.  Stairs            Wheelchair Mobility    Modified Rankin (Stroke Patients Only)       Balance Overall balance assessment: Needs assistance   Sitting balance-Leahy Scale: Fair       Standing balance-Leahy Scale: Poor Standing balance comment: Requires use of UE support on RW during static and dynamic standing due to balance deficits and pain through LLE.                             Pertinent Vitals/Pain Pain Assessment: 0-10 Pain Score:  (not rated on pain scale.) Pain Location: LLE Pain Descriptors / Indicators: Aching;Sore Pain Intervention(s): Limited activity within patient's tolerance;Monitored during session;Repositioned    Home Living Family/patient expects to be discharged to:: Private residence Living Arrangements: Children Available Help at Discharge: Family;Available PRN/intermittently (Lives with daughter who works but reports a cousin can stay with her if needed.) Type of Home: House Home Access: Stairs to enter Entrance Stairs-Rails: Right Entrance Stairs-Number of Steps: 3 Home Layout: One level Home  Equipment: Gilford Rile - 2 wheels;Cane - single point;Toilet riser;Other (comment);Shower seat      Prior Function Level of Independence: Needs assistance   Gait / Transfers Assistance Needed: Uses SPC for  household and community ambulation. 3 falls reported since January. Has life alert.  ADL's / Homemaking Assistance Needed: Does sponge baths and gets assist from daughter. Assists with cooking. Drives.        Hand Dominance        Extremity/Trunk Assessment   Upper Extremity Assessment: Overall WFL for tasks assessed (age appropriate.)           Lower Extremity Assessment: Generalized weakness;LLE deficits/detail   LLE Deficits / Details: Erythema, redness and warmth noted LLE distal to knee. Painful to palpation. Limited hip flexion AROM however functional.     Communication   Communication: No difficulties  Cognition Arousal/Alertness: Awake/alert Behavior During Therapy: WFL for tasks assessed/performed Overall Cognitive Status: Within Functional Limits for tasks assessed (A&O x4.)                      General Comments      Exercises        Assessment/Plan    PT Assessment Patient needs continued PT services  PT Diagnosis Generalized weakness;Acute pain;Difficulty walking   PT Problem List Decreased strength;Pain;Decreased activity tolerance;Decreased knowledge of use of DME;Decreased safety awareness;Decreased mobility;Decreased knowledge of precautions;Decreased balance;Decreased skin integrity  PT Treatment Interventions DME instruction;Balance training;Gait training;Stair training;Patient/family education;Functional mobility training;Therapeutic activities;Therapeutic exercise   PT Goals (Current goals can be found in the Care Plan section) Acute Rehab PT Goals Patient Stated Goal: to be able to get back home PT Goal Formulation: With patient Time For Goal Achievement: 06/16/14 Potential to Achieve Goals: Good    Frequency Min 3X/week   Barriers to discharge Decreased caregiver support      Co-evaluation               End of Session Equipment Utilized During Treatment: Gait belt Activity Tolerance: Patient tolerated treatment  well Patient left: in chair;with call bell/phone within reach;with chair alarm set;with family/visitor present Nurse Communication: Mobility status         Time: 9211-9417 PT Time Calculation (min): 27 min   Charges:   PT Evaluation $Initial PT Evaluation Tier I: 1 Procedure PT Treatments $Gait Training: 8-22 mins   PT G CodesCandy Sledge A 06/02/2014, 5:17 PM Candy Sledge, [place], DPT [phone]

## 2014-06-02 NOTE — Progress Notes (Signed)
VASCULAR LAB PRELIMINARY  PRELIMINARY  PRELIMINARY  PRELIMINARY  Left lower extremity venous Doppler completed.    Preliminary report:  There is no DVT or SVT noted in the left lower extremity.  Enlarged lymph nodes noted in the left groin.  Interstitial fluid noted throughout calf.  Jawan Chavarria, RVT 06/02/2014, 9:08 AM

## 2014-06-03 ENCOUNTER — Inpatient Hospital Stay (HOSPITAL_COMMUNITY): Payer: Medicare Other

## 2014-06-03 LAB — CBC WITH DIFFERENTIAL/PLATELET
BASOS PCT: 0 % (ref 0–1)
Basophils Absolute: 0 10*3/uL (ref 0.0–0.1)
EOS ABS: 0 10*3/uL (ref 0.0–0.7)
Eosinophils Relative: 0 % (ref 0–5)
HCT: 35.4 % — ABNORMAL LOW (ref 36.0–46.0)
Hemoglobin: 11.3 g/dL — ABNORMAL LOW (ref 12.0–15.0)
Lymphocytes Relative: 7 % — ABNORMAL LOW (ref 12–46)
Lymphs Abs: 1.6 10*3/uL (ref 0.7–4.0)
MCH: 28.8 pg (ref 26.0–34.0)
MCHC: 31.9 g/dL (ref 30.0–36.0)
MCV: 90.3 fL (ref 78.0–100.0)
Monocytes Absolute: 1.1 10*3/uL — ABNORMAL HIGH (ref 0.1–1.0)
Monocytes Relative: 5 % (ref 3–12)
NEUTROS PCT: 88 % — AB (ref 43–77)
Neutro Abs: 19.2 10*3/uL — ABNORMAL HIGH (ref 1.7–7.7)
PLATELETS: 125 10*3/uL — AB (ref 150–400)
RBC: 3.92 MIL/uL (ref 3.87–5.11)
RDW: 15.9 % — ABNORMAL HIGH (ref 11.5–15.5)
WBC: 21.9 10*3/uL — ABNORMAL HIGH (ref 4.0–10.5)

## 2014-06-03 LAB — BASIC METABOLIC PANEL
Anion gap: 12 (ref 5–15)
BUN: 34 mg/dL — ABNORMAL HIGH (ref 6–23)
CO2: 24 mEq/L (ref 19–32)
CREATININE: 1.03 mg/dL (ref 0.50–1.10)
Calcium: 8.6 mg/dL (ref 8.4–10.5)
Chloride: 101 mEq/L (ref 96–112)
GFR, EST AFRICAN AMERICAN: 51 mL/min — AB (ref >90)
GFR, EST NON AFRICAN AMERICAN: 44 mL/min — AB (ref >90)
Glucose, Bld: 80 mg/dL (ref 70–99)
POTASSIUM: 4.1 meq/L (ref 3.7–5.3)
Sodium: 137 mEq/L (ref 137–147)

## 2014-06-03 MED ORDER — ALUM & MAG HYDROXIDE-SIMETH 200-200-20 MG/5ML PO SUSP: 15.0000 mL | Freq: Four times a day (QID) | ORAL | Status: DC | PRN

## 2014-06-03 MED ORDER — PANTOPRAZOLE SODIUM 40 MG PO TBEC
40.0000 mg | DELAYED_RELEASE_TABLET | Freq: Every day | ORAL | Status: DC
  Administered 2014-06-04 – 2014-06-07 (×4): 40 mg via ORAL
  Filled 2014-06-03 (×5): qty 1

## 2014-06-03 NOTE — Progress Notes (Signed)
PATIENT DETAILS Name: [name] Age: 80 y.o. Sex: female Date of Birth: Dec 11, 1914 Admit Date: 05/31/2014 Admitting Physician Phillips Grout, MD [id number] M, MD  Subjective: No major complaints-afebrile.Less pain in her leg today  Assessment/Plan: Sepsis -Sepsis pathophysiology resolved-BP stable, clinically non toxic looking- leukocytosis now downtrending -suspect likely secondary to LLE Cellulitis. No other foci of infection apparent-UA neg for UTI, CXR ?PNA-but no symptoms-repeat 2 view CXR. -Blood culture on 9/8 neg, will continue with empiric Vanco-day 4. Will stop Cefepime 9/11 (9/8>>9/11)  Left Lower Ext Cellulitis -per patient-less erythematous-mildly tender in the calf area-no fluctuant/crepiation areas seen-clinically improved-doubt abscess, initially leukocytosis worsened-however now downtrending-will hold of on MRI as likely to be low yield -Doppler neg for DVT  CKD stage III  -Renal function improving w/ volume -creatinine now close to usual baseline. Saline lock-follow lytes  Hypothyroidism -c/w Levothyroxine  Moderate Malnutrition -c/w Supplements  Disposition: Remain inpatient-home in next 2-3 days  DVT Prophylaxis: Prophylactic Heparin  Code Status: Full code  Family Communication None at bedside  Procedures:  None  CONSULTS:  None   MEDICATIONS: Scheduled Meds: . aspirin  81 mg Oral Daily  . feeding supplement (ENSURE COMPLETE)  237 mL Oral BID BM  . heparin subcutaneous  5,000 Units Subcutaneous 3 times per day  . levothyroxine  62.5 mcg Oral QAC breakfast  . pantoprazole  40 mg Oral Q1200  . timolol  1 drop Both Eyes BID  . vancomycin  500 mg Intravenous Q24H   Continuous Infusions:   PRN Meds:.acetaminophen, alum & mag hydroxide-simeth, hydrOXYzine, ondansetron (ZOFRAN) IV, ondansetron, traMADol  Antibiotics: Anti-infectives   Start     Dose/Rate Route Frequency Ordered Stop   06/03/14 0600  vancomycin  (VANCOCIN) IVPB 750 mg/150 ml premix  Status:  Discontinued     750 mg 150 mL/hr over 60 Minutes Intravenous Every 48 hours 06/01/14 0120 06/01/14 1013   06/01/14 2200  ceFEPIme (MAXIPIME) 1 g in dextrose 5 % 50 mL IVPB  Status:  Discontinued     1 g 100 mL/hr over 30 Minutes Intravenous Every 24 hours 06/01/14 0120 06/03/14 0914   06/01/14 2200  vancomycin (VANCOCIN) 500 mg in sodium chloride 0.9 % 100 mL IVPB     500 mg 100 mL/hr over 60 Minutes Intravenous Every 24 hours 06/01/14 1013     05/31/14 2200  ceFEPIme (MAXIPIME) 1 g in dextrose 5 % 50 mL IVPB  Status:  Discontinued     1 g 100 mL/hr over 30 Minutes Intravenous Every 12 hours 05/31/14 2103 06/01/14 0120   05/31/14 2100  levofloxacin (LEVAQUIN) IVPB 750 mg  Status:  Discontinued     750 mg 100 mL/hr over 90 Minutes Intravenous  Once 05/31/14 2050 05/31/14 2103   05/31/14 2000  vancomycin (VANCOCIN) IVPB 1000 mg/200 mL premix     1,000 mg 200 mL/hr over 60 Minutes Intravenous  Once 05/31/14 1949 05/31/14 2215       PHYSICAL EXAM: Vital signs in last 24 hours: Filed Vitals:   06/02/14 2132 06/03/14 0101 06/03/14 0500 06/03/14 0649  BP: 128/60   152/98  Pulse: 92   92  Temp: 99.5 F (37.5 C)   98.7 F (37.1 C)  TempSrc: Oral   Oral  Resp: 20   20  Height:      Weight:  66.1 kg (145 lb 11.6 oz) 66.1 kg (145 lb 11.6 oz)   SpO2: 96%   93%  Weight change: 3.5 kg (7 lb 11.5 oz) Filed Weights   06/02/14 0439 06/03/14 0101 06/03/14 0500  Weight: 62.6 kg (138 lb 0.1 oz) 66.1 kg (145 lb 11.6 oz) 66.1 kg (145 lb 11.6 oz)   Body mass index is 25 kg/(m^2).   Gen Exam: Awake and alert with clear speech.   Neck: Supple, No JVD.   Chest: B/L Clear.   CVS: S1 S2 Regular, no murmurs.  Abdomen: soft, BS +, non tender, non distended.  Extremities: no edema, L LE erythematous and warm to touch in circumferential fashion from knee to ankle w/o apparent wound/ulceration - R LE w/o cyanosis or edema  Neurologic: Non Focal.     Skin: No Rash.   Wounds: N/A.    Intake/Output from previous day:  Intake/Output Summary (Last 24 hours) at 06/03/14 1120 Last data filed at 06/03/14 0941  Gross per 24 hour  Intake    480 ml  Output      0 ml  Net    480 ml     LAB RESULTS: CBC  Recent Labs Lab 05/31/14 1900 06/01/14 0412 06/02/14 0825 06/03/14 0721  WBC 14.9* 25.7* 29.1* 21.9*  HGB 12.7 11.9* 11.2* 11.3*  HCT 39.9 37.2 35.5* 35.4*  PLT 164 PLATELET CLUMPS NOTED ON SMEAR, UNABLE TO ESTIMATE PLATELET CLUMPS NOTED ON SMEAR, COUNT APPEARS DECREASED 125*  MCV 92.4 91.9 92.7 90.3  MCH 29.4 29.4 29.2 28.8  MCHC 31.8 32.0 31.5 31.9  RDW 15.7* 15.9* 16.6* 15.9*  LYMPHSABS 0.4*  --   --  1.6  MONOABS 0.4  --   --  1.1*  EOSABS 0.0  --   --  0.0  BASOSABS 0.0  --   --  0.0    Chemistries   Recent Labs Lab 05/31/14 1900 06/01/14 0412 06/02/14 0825 06/03/14 0721  NA 139 139 139 137  K 3.5* 3.8 4.2 4.1  CL 95* 100 102 101  CO2 30 21 21 24   GLUCOSE 115* 92 64* 80  BUN 34* 32* 39* 34*  CREATININE 1.39* 1.32* 1.29* 1.03  CALCIUM 8.9 7.9* 8.2* 8.6    CBG: No results found for this basename: GLUCAP,  in the last 168 hours  GFR Estimated Creatinine Clearance: 29.2 ml/min (by C-G formula based on Cr of 1.03).  Coagulation profile No results found for this basename: INR, PROTIME,  in the last 168 hours  Cardiac Enzymes No results found for this basename: CK, CKMB, TROPONINI, MYOGLOBIN,  in the last 168 hours  No components found with this basename: POCBNP,  No results found for this basename: DDIMER,  in the last 72 hours No results found for this basename: HGBA1C,  in the last 72 hours No results found for this basename: CHOL, HDL, LDLCALC, TRIG, CHOLHDL, LDLDIRECT,  in the last 72 hours No results found for this basename: TSH, T4TOTAL, FREET3, T3FREE, THYROIDAB,  in the last 72 hours No results found for this basename: VITAMINB12, FOLATE, FERRITIN, TIBC, IRON, RETICCTPCT,  in the last 72  hours  Recent Labs  05/31/14 1900  LIPASE 14    Urine Studies No results found for this basename: UACOL, UAPR, USPG, UPH, UTP, UGL, UKET, UBIL, UHGB, UNIT, UROB, ULEU, UEPI, UWBC, URBC, UBAC, CAST, CRYS, UCOM, BILUA,  in the last 72 hours  MICROBIOLOGY: Recent Results (from the past 240 hour(s))  CULTURE, BLOOD (ROUTINE X 2)     Status: None   Collection Time    05/31/14  9:01 PM  Result Value Ref Range Status   Specimen Description BLOOD ARM LEFT   Final   Special Requests BOTTLES DRAWN AEROBIC AND ANAEROBIC 10CC   Final   Culture  Setup Time     Final   Value: 06/01/2014 01:52     Performed at [facility]   Culture     Final   Value:        BLOOD CULTURE RECEIVED NO GROWTH TO DATE CULTURE WILL BE HELD FOR 5 DAYS BEFORE ISSUING A FINAL NEGATIVE REPORT     Performed at [facility]   Report Status PENDING   Incomplete  MRSA PCR SCREENING     Status: None   Collection Time    06/01/14  2:03 AM      Result Value Ref Range Status   MRSA by PCR NEGATIVE  NEGATIVE Final   Comment:            The GeneXpert MRSA Assay (FDA     approved for NASAL specimens     only), is one component of a     comprehensive MRSA colonization     surveillance program. It is not     intended to diagnose MRSA     infection nor to guide or     monitor treatment for     MRSA infections.    RADIOLOGY STUDIES/RESULTS: Dg Chest Portable 1 View  05/31/2014   CLINICAL DATA:  Nausea, emesis, chills, fever.  EXAM: PORTABLE CHEST - 1 VIEW  COMPARISON:  Chest radiograph 10/07/2013  FINDINGS: Cardiac leads project over the chest. Stable heart, mediastinal, and hilar contours. Low lung volumes. Slightly increased prominence of the interstitial lung markings appears similar to prior exam.  Focal opacity in the lingula was not present on the prior chest radiograph. Negative for pneumothorax. Bones appear osteopenic.  IMPRESSION: Small focal opacity in the lingula. Possibilities include focal  atelectasis, small pleural effusion, or focal airspace disease related to pneumonia, given the clinical symptoms. Consider short-term follow-up two view chest radiograph if indicated.   Electronically Signed   By: Curlene Dolphin M.D.   On: 05/31/2014 19:37    Oren Binet, MD  [facility] Pager:[phone]  If 7PM-7AM, please contact night-coverage [email or web address] Password [email or web address] 06/03/2014, 11:20 AM   LOS: 3 days   **Disclaimer: This note may have been dictated with voice recognition software. Similar sounding words can inadvertently be transcribed and this note may contain transcription errors which may not have been corrected upon publication of note.**

## 2014-06-03 NOTE — Progress Notes (Signed)
ANTIBIOTIC CONSULT NOTE  Pharmacy Consult for Vancomycin Indication: Cellulitis  Allergies  Allergen Reactions  . Enablex [Darifenacin Hydrobromide Er] Other (See Comments)    Caused her throat and mouth to have bumps and feel like it was swelling  . Clarithromycin Swelling    REACTION: causes her mouth to swell  . Codeine Nausea Only  . Morphine Nausea And Vomiting  . Sulfonamide Derivatives Other (See Comments)    REACTION: unsure of reaction   Labs:  Recent Labs  06/01/14 0412 06/02/14 0825 06/03/14 0721  WBC 25.7* 29.1* 21.9*  HGB 11.9* 11.2* 11.3*  PLT PLATELET CLUMPS NOTED ON SMEAR, UNABLE TO ESTIMATE PLATELET CLUMPS NOTED ON SMEAR, COUNT APPEARS DECREASED 125*  CREATININE 1.32* 1.29* 1.03   Assessment: 80 year old female on day 4 of vancomycin for cellulitis Cefepime stopped today WBC now trending down, afebrile, Scr = 1.03 Blood cultures negative to date  Goal of Therapy:  Vancomycin trough = 10 to 15 mcg / dL  Plan:  Continue Vancomycin 500 mg iv Q 24 hours Continue to follow progress, fever and WBC trend Consider trough level for Sunday if not discontinued  Thank you Anette Guarneri, PharmD [phone] Tad Moore 06/03/2014,10:59 AM

## 2014-06-03 NOTE — ED Provider Notes (Signed)
Medical screening examination/treatment/procedure(s) were conducted as a shared visit with non-physician practitioner(s) and myself.  I personally evaluated the patient during the encounter.   EKG Interpretation None      Pt comes in with cc of weakness, nausea, emesis. She has CKD, Aortic regurg and PVD. Pt is noted to have tachycardia, fevers, and leukocytosis - 3/4 SIRS with normal lactate. Pt was normotensive at arrival. BP dropped whilst in the ER. Mentation normal. More fluid ordered. Pt given broad spectrum antibiotics, and admitted. Pt with sever sepsis, due to cellulitis and >PNA.  Varney Biles, MD 06/03/14 [phone]

## 2014-06-03 NOTE — Care Management Note (Addendum)
    Page 1 of 2   06/07/2014     12:40:33 PM CARE MANAGEMENT NOTE 06/07/2014  Patient:  [name], [name]   Account Number:  [id number]  Date Initiated:  06/03/2014  Documentation initiated by:  Tomi Bamberger  Subjective/Objective Assessment:   dx sepsis, cellulitis  admit- lives with daughter.  Pt has cane and rolling walker and bsc at home.     Action/Plan:   pt eval- rec hhpt   Anticipated DC Date:  06/07/2014   Anticipated DC Plan:  [place]  CM consult      [facility] Choice  HOME HEALTH   Choice offered to / List presented to:  C-1 Patient        [place] arranged  [place] PT      [place].   Status of service:  Completed, signed off Medicare Important Message given?  YES (If response is "NO", the following Medicare IM given date fields will be blank) Date Medicare IM given:  06/03/2014 Medicare IM given by:  Tomi Bamberger Date Additional Medicare IM given:  06/06/2014 Additional Medicare IM given by:  Tomi Bamberger  Discharge Disposition:  [place]  Per UR Regulation:  Reviewed for med. necessity/level of care/duration of stay  If discussed at [place] of Stay Meetings, dates discussed:    Comments:  9/15!5 [place], BSN [phone] NCM spoke with patient to see if she wants to add a [facility], she stated no, she just wants hhpt.  [facility] notiifed of dc today.  06/03/14 [place], BSN [phone] patient lives with daughter, and her neice comes by to check on her a lot.  She chose [facility] for HHPT, referral given to [facility], Butch Penny notified.  Soc will begin 24-48 hrs post dc.

## 2014-06-03 NOTE — Progress Notes (Signed)
Physical Therapy Treatment Patient Details Name: [name] MRN: [id number] DOB: 20-May-1915 Today's Date: 06/03/2014    History of Present Illness Patient is a 80 y/o highly functional female who lives at home and admitted with 1 day of high fever of 102, N/V and redness to LLE consistent with cellulitis (last episode in January 2015). DVT ruled out.     PT Comments    Patient progressing with mobility. Pt with increased difficulty negotiating steps requiring Mod A to ascend. Fatigues easily. Balance seems improved during gait training however slow gait speed noted. Pt continues to exhibit generalized weakness and impaired muscular endurance resulting in need for multiple rest breaks. Education provided on safe stair negotiation as pt needs hands on assist. Will continue to follow and progress as tolerated.    Follow Up Recommendations  Home health PT;Supervision/Assistance - 24 hour     Equipment Recommendations  None recommended by PT    Recommendations for Other Services       Precautions / Restrictions Precautions Precautions: Fall Restrictions Weight Bearing Restrictions: No    Mobility  Bed Mobility   Bed Mobility: Supine to Sit;Sit to Supine     Supine to sit: Supervision Sit to supine: Supervision   General bed mobility comments: HOB flat, no use of rails to simulate home environment. Increased time. VC's for technique.  Transfers Overall transfer level: Needs assistance Equipment used: Rolling walker (2 wheeled) Transfers: Sit to/from Stand Sit to Stand: Min guard         General transfer comment: VC's for hand placement, multiple attempts using body momentum upon standing especially when fatigued. Stood from chair x3, EOB x1.  Ambulation/Gait Ambulation/Gait assistance: Min guard Ambulation Distance (Feet): 100 Feet (+100' + 69' with 2 seated rest breaks.) Assistive device: Rolling walker (2 wheeled) Gait Pattern/deviations: Step-through  pattern;Decreased stride length;Trunk flexed;Decreased stance time - left Gait velocity: .85 ft/sec   General Gait Details: VC for posture and RW management as pt with tendency to pick up RW initially. SLow, steady gait. Multiple seated rest breaks due to fatigue. Sa02 decreased to 88% post stair negotiation - resolved within seconds with rest.   Stairs Stairs: Yes Stairs assistance: Mod assist Stair Management: One rail Right;Step to pattern Number of Stairs: 2 General stair comments: Use of rail on RUE and hand held assist on left, increased difficulty ascending steps with Mod A for stability/support. Use of 2 handrails to descend steps with Min guard, very slow.  Wheelchair Mobility    Modified Rankin (Stroke Patients Only)       Balance Overall balance assessment: Needs assistance   Sitting balance-Leahy Scale: Fair       Standing balance-Leahy Scale: Fair Standing balance comment: Able to perform dynamic standing for short period to comb hair, however requires BUE support during gait for safety/balance.                    Cognition Arousal/Alertness: Awake/alert Behavior During Therapy: WFL for tasks assessed/performed Overall Cognitive Status: Within Functional Limits for tasks assessed                      Exercises General Exercises - Lower Extremity Ankle Circles/Pumps: Both;10 reps;Seated Long Arc Quad: Both;15 reps;Seated (x2 sets) Hip Flexion/Marching: Both;Seated;10 reps    General Comments General comments (skin integrity, edema, etc.): LLE with swelling, erythema, warmth. Seems similar to presentation yesterday.      Pertinent Vitals/Pain Pain Assessment: 0-10 Pain Score:  (not  rated on pain scale.) Pain Descriptors / Indicators: Sore;Aching Pain Intervention(s): Monitored during session;Repositioned    Home Living                      Prior Function            PT Goals (current goals can now be found in the care plan  section) Progress towards PT goals: Progressing toward goals    Frequency       PT Plan Current plan remains appropriate    Co-evaluation             End of Session Equipment Utilized During Treatment: Gait belt Activity Tolerance: Patient tolerated treatment well Patient left: in bed;with call bell/phone within reach;with bed alarm set     Time: 3545-6256 PT Time Calculation (min): 34 min  Charges:  $Gait Training: 23-37 mins                    G CodesCandy Sledge A 2014/06/23, 10:52 AM Candy Sledge, PT, DPT [phone]

## 2014-06-04 ENCOUNTER — Inpatient Hospital Stay (HOSPITAL_COMMUNITY): Payer: Medicare Other

## 2014-06-04 LAB — BASIC METABOLIC PANEL
ANION GAP: 11 (ref 5–15)
BUN: 27 mg/dL — ABNORMAL HIGH (ref 6–23)
CO2: 24 meq/L (ref 19–32)
Calcium: 8.8 mg/dL (ref 8.4–10.5)
Chloride: 100 mEq/L (ref 96–112)
Creatinine, Ser: 0.94 mg/dL (ref 0.50–1.10)
GFR calc Af Amer: 57 mL/min — ABNORMAL LOW (ref >90)
GFR calc non Af Amer: 49 mL/min — ABNORMAL LOW (ref >90)
Glucose, Bld: 80 mg/dL (ref 70–99)
POTASSIUM: 4.3 meq/L (ref 3.7–5.3)
SODIUM: 135 meq/L — AB (ref 137–147)

## 2014-06-04 LAB — CBC
HCT: 32.4 % — ABNORMAL LOW (ref 36.0–46.0)
Hemoglobin: 10.3 g/dL — ABNORMAL LOW (ref 12.0–15.0)
MCH: 28.7 pg (ref 26.0–34.0)
MCHC: 31.8 g/dL (ref 30.0–36.0)
MCV: 90.3 fL (ref 78.0–100.0)
Platelets: 156 10*3/uL (ref 150–400)
RBC: 3.59 MIL/uL — ABNORMAL LOW (ref 3.87–5.11)
RDW: 15.7 % — ABNORMAL HIGH (ref 11.5–15.5)
WBC: 13.8 10*3/uL — AB (ref 4.0–10.5)

## 2014-06-04 LAB — VANCOMYCIN, TROUGH: VANCOMYCIN TR: 8.8 ug/mL — AB (ref 10.0–20.0)

## 2014-06-04 MED ORDER — GADOBENATE DIMEGLUMINE 529 MG/ML IV SOLN
14.0000 mL | Freq: Once | INTRAVENOUS | Status: AC | PRN
  Administered 2014-06-04: 14 mL via INTRAVENOUS

## 2014-06-04 MED ORDER — VANCOMYCIN HCL IN DEXTROSE 750-5 MG/150ML-% IV SOLN
750.0000 mg | INTRAVENOUS | Status: DC
  Administered 2014-06-05 – 2014-06-06 (×2): 750 mg via INTRAVENOUS
  Filled 2014-06-04 (×3): qty 150

## 2014-06-04 NOTE — Progress Notes (Signed)
ANTIBIOTIC CONSULT NOTE - FOLLOW UP  Pharmacy Consult for vancomycin Indication: cellulitis  Labs:  Recent Labs  06/02/14 0825 06/03/14 0721 06/04/14 0700  WBC 29.1* 21.9* 13.8*  HGB 11.2* 11.3* 10.3*  PLT PLATELET CLUMPS NOTED ON SMEAR, COUNT APPEARS DECREASED 125* 156  CREATININE 1.29* 1.03 0.94   Estimated Creatinine Clearance: 32.5 ml/min (by C-G formula based on Cr of 0.94).  Recent Labs  06/04/14 2131  [place]ry 8.8*     Microbiology: Recent Results (from the past 720 hour(s))  CULTURE, BLOOD (ROUTINE X 2)     Status: None   Collection Time    05/31/14  9:01 PM      Result Value Ref Range Status   Specimen Description BLOOD ARM LEFT   Final   Special Requests BOTTLES DRAWN AEROBIC AND ANAEROBIC 10CC   Final   Culture  Setup Time     Final   Value: 06/01/2014 01:52     Performed at [facility]   Culture     Final   Value:        BLOOD CULTURE RECEIVED NO GROWTH TO DATE CULTURE WILL BE HELD FOR 5 DAYS BEFORE ISSUING A FINAL NEGATIVE REPORT     Performed at [facility]   Report Status PENDING   Incomplete  MRSA PCR SCREENING     Status: None   Collection Time    06/01/14  2:03 AM      Result Value Ref Range Status   MRSA by PCR NEGATIVE  NEGATIVE Final   Comment:            The GeneXpert MRSA Assay (FDA     approved for NASAL specimens     only), is one component of a     comprehensive MRSA colonization     surveillance program. It is not     intended to diagnose MRSA     infection nor to guide or     monitor treatment for     MRSA infections.     Assessment: 80yo female subtherapeutic on vancomycin with initial dosing for cellulitis (sepsis pathophys has resolved).  Goal of Therapy:  Vancomycin trough level 10-15 mcg/ml  Plan:  Will increase vancomycin to 750mg  IV Q24H for calculated trough of ~12 and continue to monitor.  Wynona Neat, PharmD, BCPS  06/04/2014,11:49 PM

## 2014-06-04 NOTE — Progress Notes (Signed)
PATIENT DETAILS Name: Barbaraann Share Age: 80 y.o. Sex: female Date of Birth: 03-25-1915 Admit Date: 05/31/2014 Admitting Physician Phillips Grout, MD [id number] M, MD  Subjective: Less pain in her leg today-still erythematous  Assessment/Plan: Sepsis -Sepsis pathophysiology resolved-BP stable, clinically non toxic looking- leukocytosis now downtrending -suspect likely secondary to LLE Cellulitis. No other foci of infection apparent-UA neg for UTI, CXR ?PNA-but no symptoms-repeat 2 view CXR. -Blood culture on 9/8 neg, will continue with empiric Vanco-day 5. Stopped Cefepime 9/11 (9/8>>9/11)  Left Lower Ext Cellulitis -per patient-less erythematous-mildly tender in the calf area-no fluctuant/crepiation areas seen-clinically improved-doubt abscess, initially leukocytosis worsened-however now downtrending-given persistent pain/erythema have orderded  MRI left leg -Doppler neg for DVT  CKD stage III  -Renal function improving w/ volume -creatinine now close to usual baseline. Saline lock-follow lytes  Hypothyroidism -c/w Levothyroxine  Moderate Malnutrition -c/w Supplements  Disposition: Remain inpatient-home in next 2-3 days  DVT Prophylaxis: Prophylactic Heparin  Code Status: Full code  Family Communication Daughter at bedside 9/11  Procedures:  None  CONSULTS:  None   MEDICATIONS: Scheduled Meds: . aspirin  81 mg Oral Daily  . feeding supplement (ENSURE COMPLETE)  237 mL Oral BID BM  . heparin subcutaneous  5,000 Units Subcutaneous 3 times per day  . levothyroxine  62.5 mcg Oral QAC breakfast  . pantoprazole  40 mg Oral Q1200  . timolol  1 drop Both Eyes BID  . vancomycin  500 mg Intravenous Q24H   Continuous Infusions:   PRN Meds:.acetaminophen, alum & mag hydroxide-simeth, hydrOXYzine, ondansetron (ZOFRAN) IV, ondansetron, traMADol  Antibiotics: Anti-infectives   Start     Dose/Rate Route Frequency Ordered Stop   06/03/14 0600   vancomycin (VANCOCIN) IVPB 750 mg/150 ml premix  Status:  Discontinued     750 mg 150 mL/hr over 60 Minutes Intravenous Every 48 hours 06/01/14 0120 06/01/14 1013   06/01/14 2200  ceFEPIme (MAXIPIME) 1 g in dextrose 5 % 50 mL IVPB  Status:  Discontinued     1 g 100 mL/hr over 30 Minutes Intravenous Every 24 hours 06/01/14 0120 06/03/14 0914   06/01/14 2200  vancomycin (VANCOCIN) 500 mg in sodium chloride 0.9 % 100 mL IVPB     500 mg 100 mL/hr over 60 Minutes Intravenous Every 24 hours 06/01/14 1013     05/31/14 2200  ceFEPIme (MAXIPIME) 1 g in dextrose 5 % 50 mL IVPB  Status:  Discontinued     1 g 100 mL/hr over 30 Minutes Intravenous Every 12 hours 05/31/14 2103 06/01/14 0120   05/31/14 2100  levofloxacin (LEVAQUIN) IVPB 750 mg  Status:  Discontinued     750 mg 100 mL/hr over 90 Minutes Intravenous  Once 05/31/14 2050 05/31/14 2103   05/31/14 2000  vancomycin (VANCOCIN) IVPB 1000 mg/200 mL premix     1,000 mg 200 mL/hr over 60 Minutes Intravenous  Once 05/31/14 1949 05/31/14 2215       PHYSICAL EXAM: Vital signs in last 24 hours: Filed Vitals:   06/03/14 2106 06/04/14 0000 06/04/14 0421 06/04/14 0455  BP: 149/57 134/44  155/55  Pulse: 93 87  94  Temp: 98.9 F (37.2 C)   98.1 F (36.7 C)  TempSrc: Oral   Oral  Resp: 18   18  Height:      Weight:   68.2 kg (150 lb 5.7 oz)   SpO2: 96%   95%    Weight change: 2.1 kg (4 lb 10.1  oz) Filed Weights   06/03/14 0101 06/03/14 0500 06/04/14 0421  Weight: 66.1 kg (145 lb 11.6 oz) 66.1 kg (145 lb 11.6 oz) 68.2 kg (150 lb 5.7 oz)   Body mass index is 25.8 kg/(m^2).   Gen Exam: Awake and alert with clear speech.   Neck: Supple, No JVD.   Chest: B/L Clear.   CVS: S1 S2 Regular, no murmurs.  Abdomen: soft, BS +, non tender, non distended.  Extremities: no edema, L LE erythematous and warm to touch in circumferential fashion from knee to ankle w/o apparent wound/ulceration/fluctuation/crepitus - R LE w/o cyanosis or edema    Neurologic: Non Focal.   Skin: No Rash.   Wounds: N/A.    Intake/Output from previous day:  Intake/Output Summary (Last 24 hours) at 06/04/14 1156 Last data filed at 06/04/14 0929  Gross per 24 hour  Intake    320 ml  Output      0 ml  Net    320 ml     LAB RESULTS: CBC  Recent Labs Lab 05/31/14 1900 06/01/14 0412 06/02/14 0825 06/03/14 0721 06/04/14 0700  WBC 14.9* 25.7* 29.1* 21.9* 13.8*  HGB 12.7 11.9* 11.2* 11.3* 10.3*  HCT 39.9 37.2 35.5* 35.4* 32.4*  PLT 164 PLATELET CLUMPS NOTED ON SMEAR, UNABLE TO ESTIMATE PLATELET CLUMPS NOTED ON SMEAR, COUNT APPEARS DECREASED 125* 156  MCV 92.4 91.9 92.7 90.3 90.3  MCH 29.4 29.4 29.2 28.8 28.7  MCHC 31.8 32.0 31.5 31.9 31.8  RDW 15.7* 15.9* 16.6* 15.9* 15.7*  LYMPHSABS 0.4*  --   --  1.6  --   MONOABS 0.4  --   --  1.1*  --   EOSABS 0.0  --   --  0.0  --   BASOSABS 0.0  --   --  0.0  --     Chemistries   Recent Labs Lab 05/31/14 1900 06/01/14 0412 06/02/14 0825 06/03/14 0721 06/04/14 0700  NA 139 139 139 137 135*  K 3.5* 3.8 4.2 4.1 4.3  CL 95* 100 102 101 100  CO2 30 21 21 24 24   GLUCOSE 115* 92 64* 80 80  BUN 34* 32* 39* 34* 27*  CREATININE 1.39* 1.32* 1.29* 1.03 0.94  CALCIUM 8.9 7.9* 8.2* 8.6 8.8    CBG: No results found for this basename: GLUCAP,  in the last 168 hours  GFR Estimated Creatinine Clearance: 32.5 ml/min (by C-G formula based on Cr of 0.94).  Coagulation profile No results found for this basename: INR, PROTIME,  in the last 168 hours  Cardiac Enzymes No results found for this basename: CK, CKMB, TROPONINI, MYOGLOBIN,  in the last 168 hours  No components found with this basename: POCBNP,  No results found for this basename: DDIMER,  in the last 72 hours No results found for this basename: HGBA1C,  in the last 72 hours No results found for this basename: CHOL, HDL, LDLCALC, TRIG, CHOLHDL, LDLDIRECT,  in the last 72 hours No results found for this basename: TSH, T4TOTAL, FREET3,  T3FREE, THYROIDAB,  in the last 72 hours No results found for this basename: VITAMINB12, FOLATE, FERRITIN, TIBC, IRON, RETICCTPCT,  in the last 72 hours No results found for this basename: LIPASE, AMYLASE,  in the last 72 hours  Urine Studies No results found for this basename: UACOL, UAPR, USPG, UPH, UTP, UGL, UKET, UBIL, UHGB, UNIT, UROB, ULEU, UEPI, UWBC, URBC, UBAC, CAST, CRYS, UCOM, BILUA,  in the last 72 hours  MICROBIOLOGY: Recent Results (from the  past 240 hour(s))  CULTURE, BLOOD (ROUTINE X 2)     Status: None   Collection Time    05/31/14  9:01 PM      Result Value Ref Range Status   Specimen Description BLOOD ARM LEFT   Final   Special Requests BOTTLES DRAWN AEROBIC AND ANAEROBIC 10CC   Final   Culture  Setup Time     Final   Value: 06/01/2014 01:52     Performed at [facility]   Culture     Final   Value:        BLOOD CULTURE RECEIVED NO GROWTH TO DATE CULTURE WILL BE HELD FOR 5 DAYS BEFORE ISSUING A FINAL NEGATIVE REPORT     Performed at [facility]   Report Status PENDING   Incomplete  MRSA PCR SCREENING     Status: None   Collection Time    06/01/14  2:03 AM      Result Value Ref Range Status   MRSA by PCR NEGATIVE  NEGATIVE Final   Comment:            The GeneXpert MRSA Assay (FDA     approved for NASAL specimens     only), is one component of a     comprehensive MRSA colonization     surveillance program. It is not     intended to diagnose MRSA     infection nor to guide or     monitor treatment for     MRSA infections.    RADIOLOGY STUDIES/RESULTS: Dg Chest Portable 1 View  05/31/2014   CLINICAL DATA:  Nausea, emesis, chills, fever.  EXAM: PORTABLE CHEST - 1 VIEW  COMPARISON:  Chest radiograph 10/07/2013  FINDINGS: Cardiac leads project over the chest. Stable heart, mediastinal, and hilar contours. Low lung volumes. Slightly increased prominence of the interstitial lung markings appears similar to prior exam.  Focal opacity in the lingula  was not present on the prior chest radiograph. Negative for pneumothorax. Bones appear osteopenic.  IMPRESSION: Small focal opacity in the lingula. Possibilities include focal atelectasis, small pleural effusion, or focal airspace disease related to pneumonia, given the clinical symptoms. Consider short-term follow-up two view chest radiograph if indicated.   Electronically Signed   By: Curlene Dolphin M.D.   On: 05/31/2014 19:37    Oren Binet, MD  [facility] Pager:[phone]  If 7PM-7AM, please contact night-coverage [email or web address] Password [email or web address] 06/04/2014, 11:56 AM   LOS: 4 days   **Disclaimer: This note may have been dictated with voice recognition software. Similar sounding words can inadvertently be transcribed and this note may contain transcription errors which may not have been corrected upon publication of note.**

## 2014-06-04 NOTE — Evaluation (Signed)
Clinical/Bedside Swallow Evaluation Patient Details  Name: [name] MRN: [id number] Date of Birth: 04/12/1915  Today's Date: 06/04/2014 Time: 1435-1510 SLP Time Calculation (min): 35 min  Past Medical History:  Past Medical History  Diagnosis Date  . Hypertension   . Hypothyroidism   . Glaucoma   . Acute bronchitis   . Palpitations   . Unspecified venous (peripheral) insufficiency   . Pure hypercholesterolemia   . GERD (gastroesophageal reflux disease)   . Diverticulosis of colon (without mention of hemorrhage)   . Benign neoplasm of colon   . Renal insufficiency   . DJD (degenerative joint disease)   . Vitamin D deficiency disease   . Anxiety state, unspecified   . CKD (chronic kidney disease), stage III 10/06/2013   Past Surgical History:  Past Surgical History  Procedure Laterality Date  . Cataract extraction    . Abdominal hysterectomy    . Breast biopsy      Benign  . Total knee arthroplasty  1999    right  . Total knee arthroplasty  11/2008    left - Dr Lorin Mercy  . Toe amputation  08/2009    Right second toe - Dr Beola Cord  . Ercp N/A 10/06/2013    Procedure: ENDOSCOPIC RETROGRADE CHOLANGIOPANCREATOGRAPHY (ERCP);  Surgeon: Ladene Artist, MD;  Location: [facility];  Service: Endoscopy;  Laterality: N/A;   HPI:  80 year old female admitted 05/31/14 due to fever. PMH significant for GERD. BSE ordered to evaluate swallow function and safety.   Assessment / Plan / Recommendation Clinical Impression  Pt exhibits no overt s/s aspiration with thin liquids. She reports no history of swallowing difficulty, and indicates no difficulty, only poor appetite. Recommend continuing current diet. ST to recheck to assess diet tolerance, possibly during a meal. Risk of aspiration is increased due to advanced age.    Aspiration Risk  Moderate    Diet Recommendation Regular;Thin liquid   Liquid Administration via: Cup;Straw Medication Administration: Whole meds with  liquid Supervision: Patient able to self feed Compensations: Slow rate;Small sips/bites;Follow solids with liquid Postural Changes and/or Swallow Maneuvers: Seated upright 90 degrees;Upright 30-60 min after meal    Other  Recommendations Oral Care Recommendations: Oral care BID   Follow Up Recommendations   (TBD)    Frequency and Duration min 1 x/week  1 week   Pertinent Vitals/Pain VSS, no pain reported    SLP Swallow Goals  diet tolerance   Swallow Study Prior Functional Status     Pt reports poor appetite with weight loss, but no coughing or choking reported. No history of difficulty swallowing prior to admit.    General Date of Onset: 05/31/14 HPI: 80 year old female admitted 05/31/14 due to fever. PMH significant for GERD. BSE ordered to evaluate swallow function and safety. Type of Study: Bedside swallow evaluation Previous Swallow Assessment: none found Diet Prior to this Study: Regular;Thin liquids Temperature Spikes Noted: No Respiratory Status: Room air History of Recent Intubation: No Behavior/Cognition: Alert;Cooperative;Pleasant mood Oral Cavity - Dentition: Adequate natural dentition Self-Feeding Abilities: Able to feed self Patient Positioning: Upright in bed Baseline Vocal Quality: Clear Volitional Cough: Strong Volitional Swallow: Able to elicit    Oral/Motor/Sensory Function Overall Oral Motor/Sensory Function: Appears within functional limits for tasks assessed   Ice Chips Ice chips: Not tested   Thin Liquid Thin Liquid: Within functional limits Presentation: Straw    Nectar Thick Nectar Thick Liquid: Not tested   Honey Thick Honey Thick Liquid: Not tested  Puree Puree: Not tested Other Comments: Pt refused - no appetite   Solid   GO    Solid: Not tested Other Comments: pt refused - no appetite      [name] [facility], [place] [phone] Shonna Chock 06/04/2014,3:15 PM

## 2014-06-05 MED ORDER — FENTANYL CITRATE 0.05 MG/ML IJ SOLN
12.5000 ug | Freq: Once | INTRAMUSCULAR | Status: AC
  Administered 2014-06-05: 12.5 ug via INTRAVENOUS
  Filled 2014-06-05: qty 2

## 2014-06-05 MED ORDER — BISACODYL 10 MG RE SUPP
10.0000 mg | Freq: Every day | RECTAL | Status: DC | PRN
  Administered 2014-06-06: 10 mg via RECTAL
  Filled 2014-06-05: qty 1

## 2014-06-05 MED ORDER — FUROSEMIDE 20 MG PO TABS
20.0000 mg | ORAL_TABLET | Freq: Once | ORAL | Status: AC
  Administered 2014-06-05: 20 mg via ORAL
  Filled 2014-06-05: qty 1

## 2014-06-05 MED ORDER — POLYETHYLENE GLYCOL 3350 17 G PO PACK
17.0000 g | PACK | Freq: Every day | ORAL | Status: DC
Start: 2014-06-05 — End: 2014-06-07
  Administered 2014-06-05 – 2014-06-07 (×3): 17 g via ORAL
  Filled 2014-06-05 (×3): qty 1

## 2014-06-05 NOTE — Progress Notes (Signed)
PATIENT DETAILS Name: [name] Age: 80 y.o. Sex: female Date of Birth: 08/09/1915 Admit Date: 05/31/2014 Admitting Physician Phillips Grout, MD SFK:[id number] M, MD  Subjective: Left leg still erythematous-essentially unchanged  Assessment/Plan: Sepsis -Sepsis pathophysiology resolved-BP stable, clinically non toxic looking- leukocytosis now downtrending.  -Llikely secondary to LLE Cellulitis. No other foci of infection apparent-UA neg for UTI, CXR ?PNA-but no symptoms present.  -Blood culture on 9/8 neg, will continue with empiric Vanco-day 6. Stopped Cefepime 9/11 (9/8>>9/11)  Left Lower Ext Cellulitis -per patient-less erythematous-mildly tender in the calf area-no fluctuant/crepiation areas seen-clinically improved-doubt abscess, initially leukocytosis worsened-however now downtrending-given persistent pain/erythema-  MRI left leg done on 9/12-negative for abscess/mysositis-shows cellulitis.Unfortunately left leg still appears to be erythematous, mildly tender. Since overall improved- with stable hemodynamics, decreasing leukocytosis-suspect left leg will take time to improve.Case briefly discussed with Dr. Megan Salon over the phone on 9/13, who advised to continue with IV vancomycin. -Doppler neg for DVT  CKD stage III  -Renal function improving w/ volume -creatinine now close to usual baseline. Saline lock-follow lytes  Hypothyroidism -c/w Levothyroxine  Moderate Malnutrition -c/w Supplements  Disposition: Remain inpatient-home in next 2-3 days  DVT Prophylaxis: Prophylactic Heparin  Code Status: Full code  Family Communication Daughter at bedside 9/11  Procedures:  None  CONSULTS:  None   MEDICATIONS: Scheduled Meds: . aspirin  81 mg Oral Daily  . feeding supplement (ENSURE COMPLETE)  237 mL Oral BID BM  . heparin subcutaneous  5,000 Units Subcutaneous 3 times per day  . levothyroxine  62.5 mcg Oral QAC breakfast  . pantoprazole  40 mg  Oral Q1200  . timolol  1 drop Both Eyes BID  . vancomycin  750 mg Intravenous Q24H   Continuous Infusions:   PRN Meds:.acetaminophen, alum & mag hydroxide-simeth, hydrOXYzine, ondansetron (ZOFRAN) IV, ondansetron, traMADol  Antibiotics: Anti-infectives   Start     Dose/Rate Route Frequency Ordered Stop   06/05/14 1600  vancomycin (VANCOCIN) IVPB 750 mg/150 ml premix     750 mg 150 mL/hr over 60 Minutes Intravenous Every 24 hours 06/04/14 2348     06/03/14 0600  vancomycin (VANCOCIN) IVPB 750 mg/150 ml premix  Status:  Discontinued     750 mg 150 mL/hr over 60 Minutes Intravenous Every 48 hours 06/01/14 0120 06/01/14 1013   06/01/14 2200  ceFEPIme (MAXIPIME) 1 g in dextrose 5 % 50 mL IVPB  Status:  Discontinued     1 g 100 mL/hr over 30 Minutes Intravenous Every 24 hours 06/01/14 0120 06/03/14 0914   06/01/14 2200  vancomycin (VANCOCIN) 500 mg in sodium chloride 0.9 % 100 mL IVPB  Status:  Discontinued     500 mg 100 mL/hr over 60 Minutes Intravenous Every 24 hours 06/01/14 1013 06/04/14 2348   05/31/14 2200  ceFEPIme (MAXIPIME) 1 g in dextrose 5 % 50 mL IVPB  Status:  Discontinued     1 g 100 mL/hr over 30 Minutes Intravenous Every 12 hours 05/31/14 2103 06/01/14 0120   05/31/14 2100  levofloxacin (LEVAQUIN) IVPB 750 mg  Status:  Discontinued     750 mg 100 mL/hr over 90 Minutes Intravenous  Once 05/31/14 2050 05/31/14 2103   05/31/14 2000  vancomycin (VANCOCIN) IVPB 1000 mg/200 mL premix     1,000 mg 200 mL/hr over 60 Minutes Intravenous  Once 05/31/14 1949 05/31/14 2215       PHYSICAL EXAM: Vital signs in last 24 hours: Filed Vitals:  06/04/14 1305 06/04/14 2208 06/05/14 0500 06/05/14 0612  BP: 157/55 154/52  141/58  Pulse: 95 97  93  Temp: 98.4 F (36.9 C) 98.2 F (36.8 C)  98.1 F (36.7 C)  TempSrc: Oral Oral  Oral  Resp: 18 17  16   Height:      Weight:   66.225 kg (146 lb)   SpO2: 97% 94%  93%    Weight change: -1.975 kg (-4 lb 5.7 oz) Filed Weights    06/03/14 0500 06/04/14 0421 06/05/14 0500  Weight: 66.1 kg (145 lb 11.6 oz) 68.2 kg (150 lb 5.7 oz) 66.225 kg (146 lb)   Body mass index is 25.05 kg/(m^2).   Gen Exam: Awake and alert with clear speech.   Neck: Supple, No JVD.   Chest: B/L Clear.  No rales or rhonchi CVS: S1 S2 Regular, no murmurs.  Abdomen: soft, BS +, non tender, non distended.  Extremities: no edema, L LE still erythematous and swollen w/o apparent wound/ulceration/fluctuation/crepitus - R LE w/o cyanosis or edema  Neurologic: Non Focal.   Skin: No Rash.   Wounds: N/A.    Intake/Output from previous day:  Intake/Output Summary (Last 24 hours) at 06/05/14 1046 Last data filed at 06/05/14 0900  Gross per 24 hour  Intake    820 ml  Output      0 ml  Net    820 ml     LAB RESULTS: CBC  Recent Labs Lab 05/31/14 1900 06/01/14 0412 06/02/14 0825 06/03/14 0721 06/04/14 0700  WBC 14.9* 25.7* 29.1* 21.9* 13.8*  HGB 12.7 11.9* 11.2* 11.3* 10.3*  HCT 39.9 37.2 35.5* 35.4* 32.4*  PLT 164 PLATELET CLUMPS NOTED ON SMEAR, UNABLE TO ESTIMATE PLATELET CLUMPS NOTED ON SMEAR, COUNT APPEARS DECREASED 125* 156  MCV 92.4 91.9 92.7 90.3 90.3  MCH 29.4 29.4 29.2 28.8 28.7  MCHC 31.8 32.0 31.5 31.9 31.8  RDW 15.7* 15.9* 16.6* 15.9* 15.7*  LYMPHSABS 0.4*  --   --  1.6  --   MONOABS 0.4  --   --  1.1*  --   EOSABS 0.0  --   --  0.0  --   BASOSABS 0.0  --   --  0.0  --     Chemistries   Recent Labs Lab 05/31/14 1900 06/01/14 0412 06/02/14 0825 06/03/14 0721 06/04/14 0700  NA 139 139 139 137 135*  K 3.5* 3.8 4.2 4.1 4.3  CL 95* 100 102 101 100  CO2 30 21 21 24 24   GLUCOSE 115* 92 64* 80 80  BUN 34* 32* 39* 34* 27*  CREATININE 1.39* 1.32* 1.29* 1.03 0.94  CALCIUM 8.9 7.9* 8.2* 8.6 8.8    CBG: No results found for this basename: GLUCAP,  in the last 168 hours  GFR Estimated Creatinine Clearance: 32 ml/min (by C-G formula based on Cr of 0.94).  Coagulation profile No results found for this basename: INR,  PROTIME,  in the last 168 hours  Cardiac Enzymes No results found for this basename: CK, CKMB, TROPONINI, MYOGLOBIN,  in the last 168 hours  No components found with this basename: POCBNP,  No results found for this basename: DDIMER,  in the last 72 hours No results found for this basename: HGBA1C,  in the last 72 hours No results found for this basename: CHOL, HDL, LDLCALC, TRIG, CHOLHDL, LDLDIRECT,  in the last 72 hours No results found for this basename: TSH, T4TOTAL, FREET3, T3FREE, THYROIDAB,  in the last 72 hours No results found  for this basename: VITAMINB12, FOLATE, FERRITIN, TIBC, IRON, RETICCTPCT,  in the last 72 hours No results found for this basename: LIPASE, AMYLASE,  in the last 72 hours  Urine Studies No results found for this basename: UACOL, UAPR, USPG, UPH, UTP, UGL, UKET, UBIL, UHGB, UNIT, UROB, ULEU, UEPI, UWBC, URBC, UBAC, CAST, CRYS, UCOM, BILUA,  in the last 72 hours  MICROBIOLOGY: Recent Results (from the past 240 hour(s))  CULTURE, BLOOD (ROUTINE X 2)     Status: None   Collection Time    05/31/14  9:01 PM      Result Value Ref Range Status   Specimen Description BLOOD ARM LEFT   Final   Special Requests BOTTLES DRAWN AEROBIC AND ANAEROBIC 10CC   Final   Culture  Setup Time     Final   Value: 06/01/2014 01:52     Performed at [facility]   Culture     Final   Value:        BLOOD CULTURE RECEIVED NO GROWTH TO DATE CULTURE WILL BE HELD FOR 5 DAYS BEFORE ISSUING A FINAL NEGATIVE REPORT     Performed at [facility]   Report Status PENDING   Incomplete  MRSA PCR SCREENING     Status: None   Collection Time    06/01/14  2:03 AM      Result Value Ref Range Status   MRSA by PCR NEGATIVE  NEGATIVE Final   Comment:            The GeneXpert MRSA Assay (FDA     approved for NASAL specimens     only), is one component of a     comprehensive MRSA colonization     surveillance program. It is not     intended to diagnose MRSA     infection nor to  guide or     monitor treatment for     MRSA infections.    RADIOLOGY STUDIES/RESULTS: Dg Chest Portable 1 View  05/31/2014   CLINICAL DATA:  Nausea, emesis, chills, fever.  EXAM: PORTABLE CHEST - 1 VIEW  COMPARISON:  Chest radiograph 10/07/2013  FINDINGS: Cardiac leads project over the chest. Stable heart, mediastinal, and hilar contours. Low lung volumes. Slightly increased prominence of the interstitial lung markings appears similar to prior exam.  Focal opacity in the lingula was not present on the prior chest radiograph. Negative for pneumothorax. Bones appear osteopenic.  IMPRESSION: Small focal opacity in the lingula. Possibilities include focal atelectasis, small pleural effusion, or focal airspace disease related to pneumonia, given the clinical symptoms. Consider short-term follow-up two view chest radiograph if indicated.   Electronically Signed   By: Curlene Dolphin M.D.   On: 05/31/2014 19:37    Oren Binet, MD  [facility] Pager:[phone]  If 7PM-7AM, please contact night-coverage [email or web address] Password [email or web address] 06/05/2014, 10:46 AM   LOS: 5 days   **Disclaimer: This note may have been dictated with voice recognition software. Similar sounding words can inadvertently be transcribed and this note may contain transcription errors which may not have been corrected upon publication of note.**

## 2014-06-06 DIAGNOSIS — R918 Other nonspecific abnormal finding of lung field: Secondary | ICD-10-CM

## 2014-06-06 LAB — CBC
HCT: 36.2 % (ref 36.0–46.0)
HEMOGLOBIN: 11.6 g/dL — AB (ref 12.0–15.0)
MCH: 28.7 pg (ref 26.0–34.0)
MCHC: 32 g/dL (ref 30.0–36.0)
MCV: 89.6 fL (ref 78.0–100.0)
Platelets: UNDETERMINED 10*3/uL (ref 150–400)
RBC: 4.04 MIL/uL (ref 3.87–5.11)
RDW: 15.5 % (ref 11.5–15.5)
WBC: 11.7 10*3/uL — AB (ref 4.0–10.5)

## 2014-06-06 MED ORDER — FUROSEMIDE 20 MG PO TABS
20.0000 mg | ORAL_TABLET | Freq: Once | ORAL | Status: AC
  Administered 2014-06-06: 20 mg via ORAL
  Filled 2014-06-06: qty 1

## 2014-06-06 MED ORDER — FUROSEMIDE 40 MG PO TABS
40.0000 mg | ORAL_TABLET | Freq: Every day | ORAL | Status: DC
  Administered 2014-06-07: 40 mg via ORAL
  Filled 2014-06-06: qty 1

## 2014-06-06 NOTE — Progress Notes (Signed)
RN noticed pt diastolic Bp is low. Callahan,NP made aware. We will continue to monitor.

## 2014-06-06 NOTE — Consult Note (Signed)
[name] for Infectious Disease  Date of Admission:  05/31/2014  Date of Consult:  06/06/2014  Reason for Consult: Cellulitis LLE Referring Physician: Sloan Leiter  Impression/Recommendation Cellulitis LLE RUL infiltrate  Would Send her home on keflex 7-10 days Consider wound care f/u for ace wrap of her LE, compression stockings when her leg is smaller.  Keep leg elevated Diuresis?  Comment Most commonly infections such as this are a combination of strep, poor circulation/lymphatic drainage issues.   Thank you so much for this interesting consult,   Bobby Rumpf (pager) [phone] [email or web address]  [name] is an 80 y.o. female.  HPI: 80 yo F with hx of gout, htn, CKD III, comes to ED on 9-8 with onset of LLE pain and swelling. She states that she had previous BLE cellulitis in January 2015.  She states that she had chills prior to arrival. She was started on vanco/cefepime and admitted to stepdown due to hypotension.  She underwent duplex (-) for DVT. She also underwent MRI which showed cellulitis.  She has been afebrile, her WBC has been improving  Past Medical History  Diagnosis Date  . Hypertension   . Hypothyroidism   . Glaucoma   . Acute bronchitis   . Palpitations   . Unspecified venous (peripheral) insufficiency   . Pure hypercholesterolemia   . GERD (gastroesophageal reflux disease)   . Diverticulosis of colon (without mention of hemorrhage)   . Benign neoplasm of colon   . Renal insufficiency   . DJD (degenerative joint disease)   . Vitamin D deficiency disease   . Anxiety state, unspecified   . CKD (chronic kidney disease), stage III 10/06/2013    Past Surgical History  Procedure Laterality Date  . Cataract extraction    . Abdominal hysterectomy    . Breast biopsy      Benign  . Total knee arthroplasty  1999    right  . Total knee arthroplasty  11/2008    left - Dr Lorin Mercy  . Toe amputation  08/2009    Right second toe - Dr  Beola Cord  . Ercp N/A 10/06/2013    Procedure: ENDOSCOPIC RETROGRADE CHOLANGIOPANCREATOGRAPHY (ERCP);  Surgeon: Ladene Artist, MD;  Location: [facility];  Service: Endoscopy;  Laterality: N/A;     Allergies  Allergen Reactions  . Enablex [Darifenacin Hydrobromide Er] Other (See Comments)    Caused her throat and mouth to have bumps and feel like it was swelling  . Clarithromycin Swelling    REACTION: causes her mouth to swell  . Codeine Nausea Only  . Morphine Nausea And Vomiting  . Sulfonamide Derivatives Other (See Comments)    REACTION: unsure of reaction    Medications:  Scheduled: . aspirin  81 mg Oral Daily  . feeding supplement (ENSURE COMPLETE)  237 mL Oral BID BM  . heparin subcutaneous  5,000 Units Subcutaneous 3 times per day  . levothyroxine  62.5 mcg Oral QAC breakfast  . pantoprazole  40 mg Oral Q1200  . polyethylene glycol  17 g Oral Daily  . timolol  1 drop Both Eyes BID  . vancomycin  750 mg Intravenous Q24H    Abtx:  Anti-infectives   Start     Dose/Rate Route Frequency Ordered Stop   06/05/14 1600  vancomycin (VANCOCIN) IVPB 750 mg/150 ml premix     750 mg 150 mL/hr over 60 Minutes Intravenous Every 24 hours 06/04/14 2348     06/03/14 0600  vancomycin (VANCOCIN) IVPB 750 mg/150  ml premix  Status:  Discontinued     750 mg 150 mL/hr over 60 Minutes Intravenous Every 48 hours 06/01/14 0120 06/01/14 1013   06/01/14 2200  ceFEPIme (MAXIPIME) 1 g in dextrose 5 % 50 mL IVPB  Status:  Discontinued     1 g 100 mL/hr over 30 Minutes Intravenous Every 24 hours 06/01/14 0120 06/03/14 0914   06/01/14 2200  vancomycin (VANCOCIN) 500 mg in sodium chloride 0.9 % 100 mL IVPB  Status:  Discontinued     500 mg 100 mL/hr over 60 Minutes Intravenous Every 24 hours 06/01/14 1013 06/04/14 2348   05/31/14 2200  ceFEPIme (MAXIPIME) 1 g in dextrose 5 % 50 mL IVPB  Status:  Discontinued     1 g 100 mL/hr over 30 Minutes Intravenous Every 12 hours 05/31/14 2103 06/01/14 0120    05/31/14 2100  levofloxacin (LEVAQUIN) IVPB 750 mg  Status:  Discontinued     750 mg 100 mL/hr over 90 Minutes Intravenous  Once 05/31/14 2050 05/31/14 2103   05/31/14 2000  vancomycin (VANCOCIN) IVPB 1000 mg/200 mL premix     1,000 mg 200 mL/hr over 60 Minutes Intravenous  Once 05/31/14 1949 05/31/14 2215      Total days of antibiotics: 6 (vanco)          Social History:  reports that she has never smoked. She has never used smokeless tobacco. She reports that she does not drink alcohol or use illicit drugs.  Family History  Problem Relation Age of Onset  . Heart disease Mother   . Cancer Father     Throat  . Heart disease Father     General ROS: denies diarrhea, denies urinary difficulty, no dysphagia, no recent trauma to her leg, see HPI.  Blood pressure 144/54, pulse 86, temperature 98.1 F (36.7 C), temperature source Oral, resp. rate 16, height 5\' 4"  (1.626 m), weight 67.132 kg (148 lb), SpO2 96.00%. General appearance: alert, cooperative and no distress Eyes: EOMI, R pupil irregular.  Throat: normal findings: oropharynx pink & moist without lesions or evidence of thrush Neck: no adenopathy and supple, symmetrical, trachea midline Lungs: clear to auscultation bilaterally Heart: regular rate and rhythm Abdomen: normal findings: bowel sounds normal and soft, non-tender Extremities: LLE swollen, below knee-tender, mild erythema, waxy skin of venous insufficency. echymoses UE   Results for orders placed during the hospital encounter of 05/31/14 (from the past 48 hour(s))  VANCOMYCIN, TROUGH     Status: Abnormal   Collection Time    06/04/14  9:31 PM      Result Value Ref Range   Vancomycin Tr 8.8 (*) 10.0 - 20.0 ug/mL  CBC     Status: Abnormal   Collection Time    06/06/14  8:51 AM      Result Value Ref Range   WBC 11.7 (*) 4.0 - 10.5 K/uL   Comment: WHITE COUNT CONFIRMED ON SMEAR   RBC 4.04  3.87 - 5.11 MIL/uL   Hemoglobin 11.6 (*) 12.0 - 15.0 g/dL   Comment:  REPEATED TO VERIFY   HCT 36.2  36.0 - 46.0 %   MCV 89.6  78.0 - 100.0 fL   MCH 28.7  26.0 - 34.0 pg   MCHC 32.0  30.0 - 36.0 g/dL   RDW 15.5  11.5 - 15.5 %   Platelets PLATELET CLUMPS NOTED ON SMEAR, UNABLE TO ESTIMATE  150 - 400 K/uL      Component Value Date/Time   SDES BLOOD ARM LEFT  05/31/2014 2101   SPECREQUEST BOTTLES DRAWN AEROBIC AND ANAEROBIC 10CC 05/31/2014 2101   CULT  Value:        BLOOD CULTURE RECEIVED NO GROWTH TO DATE CULTURE WILL BE HELD FOR 5 DAYS BEFORE ISSUING A FINAL NEGATIVE REPORT Performed at [facility] 05/31/2014 2101   REPTSTATUS PENDING 05/31/2014 2101   Mr Tibia Fibula Left W Wo Contrast  06/04/2014   CLINICAL DATA:  Pain and swelling.  EXAM: MRI OF LOWER LEFT EXTREMITY WITHOUT AND WITH CONTRAST  TECHNIQUE: Multiplanar, multisequence MR imaging of the lower left extremity was performed both before and after administration of intravenous contrast.  CONTRAST:  75mL MULTIHANCE GADOBENATE DIMEGLUMINE 529 MG/ML IV SOLN  COMPARISON:  None.  FINDINGS: There is diffuse subcutaneous soft tissue swelling/ edema and fluid involving the entire left lower extremity below the knee. No discrete drainable abscess is identified. No findings for septic arthritis or osteomyelitis. No findings for myofasciitis.  IMPRESSION: Severe cellulitis but no findings for focal soft tissue abscess, septic arthritis or osteomyelitis.   Electronically Signed   By: Kalman Jewels M.D.   On: 06/04/2014 21:29   Recent Results (from the past 240 hour(s))  CULTURE, BLOOD (ROUTINE X 2)     Status: None   Collection Time    05/31/14  9:01 PM      Result Value Ref Range Status   Specimen Description BLOOD ARM LEFT   Final   Special Requests BOTTLES DRAWN AEROBIC AND ANAEROBIC 10CC   Final   Culture  Setup Time     Final   Value: 06/01/2014 01:52     Performed at [facility]   Culture     Final   Value:        BLOOD CULTURE RECEIVED NO GROWTH TO DATE CULTURE WILL BE HELD FOR 5 DAYS BEFORE  ISSUING A FINAL NEGATIVE REPORT     Performed at [facility]   Report Status PENDING   Incomplete  MRSA PCR SCREENING     Status: None   Collection Time    06/01/14  2:03 AM      Result Value Ref Range Status   MRSA by PCR NEGATIVE  NEGATIVE Final   Comment:            The GeneXpert MRSA Assay (FDA     approved for NASAL specimens     only), is one component of a     comprehensive MRSA colonization     surveillance program. It is not     intended to diagnose MRSA     infection nor to guide or     monitor treatment for     MRSA infections.      06/06/2014, 3:54 PM     LOS: 6 days

## 2014-06-06 NOTE — Progress Notes (Signed)
Speech Language Pathology Treatment: Dysphagia  Patient Details Name: [name] MRN: [id number] DOB: [birth year] Today's Date: 06/06/2014 Time: 7782-4235 SLP Time Calculation (min): 8 min  Assessment / Plan / Recommendation Clinical Impression  Pt. exhibited modified independence during observation with solid textures and thin liquids via cup/straw.  Pt. consumed consecutive sips with straw without difficulty; no indications penetration/aspiration.  Educated on general aspiration/esophageal precautions. Recommend continue regular texture and thin liquids.  ST will sign off.   HPI HPI: 80 year old female admitted 05/31/14 due to fever. PMH significant for GERD. CXR 9/11 New area of infiltrate right upper lobe. Small left effusion with left base atelectasis. BSE ordered to evaluate swallow function and safety.   Pertinent Vitals Pain Assessment: No/denies pain  SLP Plan  All goals met    Recommendations Diet recommendations: Regular;Thin liquid Liquids provided via: Cup;Straw Medication Administration: Whole meds with liquid Supervision: Patient able to self feed Compensations: Slow rate;Small sips/bites Postural Changes and/or Swallow Maneuvers: Seated upright 90 degrees;Upright 30-60 min after meal              Oral Care Recommendations: Oral care BID Follow up Recommendations: None Plan: All goals met    GO     [name] 06/06/2014, 10:37 AM  [name] Colvin Caroli.Ed [employer] [phone]

## 2014-06-06 NOTE — Progress Notes (Signed)
PATIENT DETAILS Name: [name] Age: 80 y.o. Sex: female Date of Birth: 30-May-1915 Admit Date: 05/31/2014 Admitting Physician Phillips Grout, MD NIO:[id number] M, MD  Subjective: Left leg with erythema-but less erythematous  Assessment/Plan: Sepsis -Sepsis pathophysiology resolved-BP stable, clinically non toxic looking- leukocytosis now downtrending.  -Llikely secondary to LLE Cellulitis. No other foci of infection apparent-UA neg for UTI, CXR ?PNA-but no symptoms present.  -Blood culture on 9/8 neg, will continue with empiric Vanco-day 7. Stopped Cefepime 9/11 (9/8>>9/11)  Left Lower Ext Cellulitis -per patient-less erythematous-mildly tender in the calf area-no fluctuant/crepiation areas seen-clinically improved-doubt abscess, initially leukocytosis worsened-however now downtrending-given persistent pain/erythema-  MRI left leg done on 9/12-negative for abscess/mysositis-shows cellulitis.Unfortunately left leg still appears to be erythematous,but less erythematous.. Since overall improved- with stable hemodynamics, decreasing leukocytosis-suspect left leg will take time to improve.Will ask ID to evaluate and advise regarding discharge antibiotics. -Doppler neg for DVT  CKD stage III  -Renal function improving w/ volume -creatinine now close to usual baseline.   Hypothyroidism -c/w Levothyroxine  Moderate Malnutrition -c/w Supplements  Disposition: Remain inpatient-home today or tomorrow  DVT Prophylaxis: Prophylactic Heparin  Code Status: Full code  Family Communication Daughter at bedside 9/11  Procedures:  None  CONSULTS:  None   MEDICATIONS: Scheduled Meds: . aspirin  81 mg Oral Daily  . feeding supplement (ENSURE COMPLETE)  237 mL Oral BID BM  . heparin subcutaneous  5,000 Units Subcutaneous 3 times per day  . levothyroxine  62.5 mcg Oral QAC breakfast  . pantoprazole  40 mg Oral Q1200  . polyethylene glycol  17 g Oral Daily  . timolol   1 drop Both Eyes BID  . vancomycin  750 mg Intravenous Q24H   Continuous Infusions:   PRN Meds:.acetaminophen, alum & mag hydroxide-simeth, bisacodyl, hydrOXYzine, ondansetron (ZOFRAN) IV, ondansetron, traMADol  Antibiotics: Anti-infectives   Start     Dose/Rate Route Frequency Ordered Stop   06/05/14 1600  vancomycin (VANCOCIN) IVPB 750 mg/150 ml premix     750 mg 150 mL/hr over 60 Minutes Intravenous Every 24 hours 06/04/14 2348     06/03/14 0600  vancomycin (VANCOCIN) IVPB 750 mg/150 ml premix  Status:  Discontinued     750 mg 150 mL/hr over 60 Minutes Intravenous Every 48 hours 06/01/14 0120 06/01/14 1013   06/01/14 2200  ceFEPIme (MAXIPIME) 1 g in dextrose 5 % 50 mL IVPB  Status:  Discontinued     1 g 100 mL/hr over 30 Minutes Intravenous Every 24 hours 06/01/14 0120 06/03/14 0914   06/01/14 2200  vancomycin (VANCOCIN) 500 mg in sodium chloride 0.9 % 100 mL IVPB  Status:  Discontinued     500 mg 100 mL/hr over 60 Minutes Intravenous Every 24 hours 06/01/14 1013 06/04/14 2348   05/31/14 2200  ceFEPIme (MAXIPIME) 1 g in dextrose 5 % 50 mL IVPB  Status:  Discontinued     1 g 100 mL/hr over 30 Minutes Intravenous Every 12 hours 05/31/14 2103 06/01/14 0120   05/31/14 2100  levofloxacin (LEVAQUIN) IVPB 750 mg  Status:  Discontinued     750 mg 100 mL/hr over 90 Minutes Intravenous  Once 05/31/14 2050 05/31/14 2103   05/31/14 2000  vancomycin (VANCOCIN) IVPB 1000 mg/200 mL premix     1,000 mg 200 mL/hr over 60 Minutes Intravenous  Once 05/31/14 1949 05/31/14 2215       PHYSICAL EXAM: Vital signs in last 24 hours: Filed Vitals:  06/05/14 0612 06/05/14 1559 06/05/14 2220 06/06/14 0510  BP: 141/58 139/41 156/51 126/44  Pulse: 93 82 95 91  Temp: 98.1 F (36.7 C) 98.3 F (36.8 C) 99.5 F (37.5 C) 98.3 F (36.8 C)  TempSrc: Oral Oral Oral Oral  Resp: 16 18 17 16   Height:      Weight:    67.132 kg (148 lb)  SpO2: 93% 100% 96% 91%    Weight change: 0.907 kg (2 lb) Filed  Weights   06/04/14 0421 06/05/14 0500 06/06/14 0510  Weight: 68.2 kg (150 lb 5.7 oz) 66.225 kg (146 lb) 67.132 kg (148 lb)   Body mass index is 25.39 kg/(m^2).   Gen Exam: Awake and alert with clear speech.   Neck: Supple, No JVD.   Chest: B/L Clear.  No rales or rhonchi CVS: S1 S2 Regular, no murmurs.  Abdomen: soft, BS +, non tender, non distended.  Extremities: no edema, L LE still erythematous from left knee to left ankle Neurologic: Non Focal.   Skin: No Rash.   Wounds: N/A.    Intake/Output from previous day:  Intake/Output Summary (Last 24 hours) at 06/06/14 1312 Last data filed at 06/06/14 0936  Gross per 24 hour  Intake    370 ml  Output    200 ml  Net    170 ml     LAB RESULTS: CBC  Recent Labs Lab 05/31/14 1900 06/01/14 0412 06/02/14 0825 06/03/14 0721 06/04/14 0700 06/06/14 0851  WBC 14.9* 25.7* 29.1* 21.9* 13.8* 11.7*  HGB 12.7 11.9* 11.2* 11.3* 10.3* 11.6*  HCT 39.9 37.2 35.5* 35.4* 32.4* 36.2  PLT 164 PLATELET CLUMPS NOTED ON SMEAR, UNABLE TO ESTIMATE PLATELET CLUMPS NOTED ON SMEAR, COUNT APPEARS DECREASED 125* 156 PLATELET CLUMPS NOTED ON SMEAR, UNABLE TO ESTIMATE  MCV 92.4 91.9 92.7 90.3 90.3 89.6  MCH 29.4 29.4 29.2 28.8 28.7 28.7  MCHC 31.8 32.0 31.5 31.9 31.8 32.0  RDW 15.7* 15.9* 16.6* 15.9* 15.7* 15.5  LYMPHSABS 0.4*  --   --  1.6  --   --   MONOABS 0.4  --   --  1.1*  --   --   EOSABS 0.0  --   --  0.0  --   --   BASOSABS 0.0  --   --  0.0  --   --     Chemistries   Recent Labs Lab 05/31/14 1900 06/01/14 0412 06/02/14 0825 06/03/14 0721 06/04/14 0700  NA 139 139 139 137 135*  K 3.5* 3.8 4.2 4.1 4.3  CL 95* 100 102 101 100  CO2 30 21 21 24 24   GLUCOSE 115* 92 64* 80 80  BUN 34* 32* 39* 34* 27*  CREATININE 1.39* 1.32* 1.29* 1.03 0.94  CALCIUM 8.9 7.9* 8.2* 8.6 8.8    CBG: No results found for this basename: GLUCAP,  in the last 168 hours  GFR Estimated Creatinine Clearance: 32.2 ml/min (by C-G formula based on Cr of  0.94).  Coagulation profile No results found for this basename: INR, PROTIME,  in the last 168 hours  Cardiac Enzymes No results found for this basename: CK, CKMB, TROPONINI, MYOGLOBIN,  in the last 168 hours  No components found with this basename: POCBNP,  No results found for this basename: DDIMER,  in the last 72 hours No results found for this basename: HGBA1C,  in the last 72 hours No results found for this basename: CHOL, HDL, LDLCALC, TRIG, CHOLHDL, LDLDIRECT,  in the last 72 hours No  results found for this basename: TSH, T4TOTAL, FREET3, T3FREE, THYROIDAB,  in the last 72 hours No results found for this basename: VITAMINB12, FOLATE, FERRITIN, TIBC, IRON, RETICCTPCT,  in the last 72 hours No results found for this basename: LIPASE, AMYLASE,  in the last 72 hours  Urine Studies No results found for this basename: UACOL, UAPR, USPG, UPH, UTP, UGL, UKET, UBIL, UHGB, UNIT, UROB, ULEU, UEPI, UWBC, URBC, UBAC, CAST, CRYS, UCOM, BILUA,  in the last 72 hours  MICROBIOLOGY: Recent Results (from the past 240 hour(s))  CULTURE, BLOOD (ROUTINE X 2)     Status: None   Collection Time    05/31/14  9:01 PM      Result Value Ref Range Status   Specimen Description BLOOD ARM LEFT   Final   Special Requests BOTTLES DRAWN AEROBIC AND ANAEROBIC 10CC   Final   Culture  Setup Time     Final   Value: 06/01/2014 01:52     Performed at [facility]   Culture     Final   Value:        BLOOD CULTURE RECEIVED NO GROWTH TO DATE CULTURE WILL BE HELD FOR 5 DAYS BEFORE ISSUING A FINAL NEGATIVE REPORT     Performed at [facility]   Report Status PENDING   Incomplete  MRSA PCR SCREENING     Status: None   Collection Time    06/01/14  2:03 AM      Result Value Ref Range Status   MRSA by PCR NEGATIVE  NEGATIVE Final   Comment:            The GeneXpert MRSA Assay (FDA     approved for NASAL specimens     only), is one component of a     comprehensive MRSA colonization     surveillance  program. It is not     intended to diagnose MRSA     infection nor to guide or     monitor treatment for     MRSA infections.    RADIOLOGY STUDIES/RESULTS: Dg Chest Portable 1 View  05/31/2014   CLINICAL DATA:  Nausea, emesis, chills, fever.  EXAM: PORTABLE CHEST - 1 VIEW  COMPARISON:  Chest radiograph 10/07/2013  FINDINGS: Cardiac leads project over the chest. Stable heart, mediastinal, and hilar contours. Low lung volumes. Slightly increased prominence of the interstitial lung markings appears similar to prior exam.  Focal opacity in the lingula was not present on the prior chest radiograph. Negative for pneumothorax. Bones appear osteopenic.  IMPRESSION: Small focal opacity in the lingula. Possibilities include focal atelectasis, small pleural effusion, or focal airspace disease related to pneumonia, given the clinical symptoms. Consider short-term follow-up two view chest radiograph if indicated.   Electronically Signed   By: Curlene Dolphin M.D.   On: 05/31/2014 19:37    Oren Binet, MD  [facility] Pager:[phone]  If 7PM-7AM, please contact night-coverage [email or web address] Password [email or web address] 06/06/2014, 1:12 PM   LOS: 6 days   **Disclaimer: This note may have been dictated with voice recognition software. Similar sounding words can inadvertently be transcribed and this note may contain transcription errors which may not have been corrected upon publication of note.**

## 2014-06-06 NOTE — Progress Notes (Signed)
Physical Therapy Treatment Patient Details Name: [name] MRN: [id number] DOB: March 21, 1915 Today's Date: 06/06/2014    History of Present Illness Patient is a 80 y/o highly functional female who lives at home and admitted with 1 day of high fever of 102, N/V and redness to LLE consistent with cellulitis (last episode in January 2015). DVT ruled out.     PT Comments    Patient progressing well with mobility. Continues to exhibit weakness, impaired endurance and pain in LLE. Pt requires Mod A to negotiate steps and assist with transfers from low surfaces. Education provided to have family assist with stair negotiation for safety and to have 24/7 supervision at home. Pt reports her niece will be home with her. Will continue to follow and progress as tolerated.   Follow Up Recommendations  Home health PT;Supervision/Assistance - 24 hour     Equipment Recommendations  None recommended by PT    Recommendations for Other Services       Precautions / Restrictions Precautions Precautions: Fall Restrictions Weight Bearing Restrictions: No    Mobility  Bed Mobility Overal bed mobility: Needs Assistance Bed Mobility: Supine to Sit     Supine to sit: Supervision     General bed mobility comments: HOB flat, no use of rails to simulate home environment. Increased time. VC's for technique.  Transfers Overall transfer level: Needs assistance Equipment used: Rolling walker (2 wheeled) Transfers: Sit to/from Stand Sit to Stand: Mod assist         General transfer comment: Min guard for safety. Able to stand from toilet with mod A and grab bar for assist. more assist required from low surfaces.  Ambulation/Gait Ambulation/Gait assistance: Min guard Ambulation Distance (Feet): 100 Feet (+ 100' with 1 seated rest break) Assistive device: Rolling walker (2 wheeled) Gait Pattern/deviations: Step-to pattern;Step-through pattern;Antalgic Gait velocity: 1.25 ft/sec   General  Gait Details: VC for posture and RW management. Min guard assist due to pain in LLE with weightbearing. Antalgic gait pattern initially which improved with increased distance. Multiple rest breaks required due to fatigue.   Stairs Stairs: Yes Stairs assistance: Mod assist Stair Management: Two rails;Step to pattern Number of Stairs: 3 General stair comments: BUEs on rails for support. Mod A to ascend steps due to weakness. VC for technique.  Wheelchair Mobility    Modified Rankin (Stroke Patients Only)       Balance Overall balance assessment: Needs assistance   Sitting balance-Leahy Scale: Good       Standing balance-Leahy Scale: Poor Standing balance comment: Requires UE support during dynamic standing activities due to balance deficits and pain in LLE.                    Cognition Arousal/Alertness: Awake/alert Behavior During Therapy: WFL for tasks assessed/performed Overall Cognitive Status: Within Functional Limits for tasks assessed                      Exercises General Exercises - Lower Extremity Ankle Circles/Pumps: Both;10 reps;Seated Long Arc Quad: Both;15 reps;Seated Hip Flexion/Marching: Both;Seated;10 reps    General Comments General comments (skin integrity, edema, etc.): LLE with pitting edema 3+, warmth and erythema.      Pertinent Vitals/Pain Pain Assessment: 0-10 Pain Score:  (not rated on scale.) Pain Location: LLE Pain Descriptors / Indicators: Sore;Aching Pain Intervention(s): Monitored during session;Repositioned    Home Living  Prior Function            PT Goals (current goals can now be found in the care plan section) Progress towards PT goals: Progressing toward goals    Frequency       PT Plan Current plan remains appropriate    Co-evaluation             End of Session Equipment Utilized During Treatment: Gait belt Activity Tolerance: Patient tolerated treatment  well Patient left: in bed;with call bell/phone within reach;with bed alarm set     Time: 1445-1515 PT Time Calculation (min): 30 min  Charges:  $Gait Training: 23-37 mins                    G CodesCandy Anderson A 15-Jun-2014, 3:54 PM [name], [place], DPT [phone]

## 2014-06-07 LAB — CULTURE, BLOOD (ROUTINE X 2): CULTURE: NO GROWTH

## 2014-06-07 MED ORDER — POTASSIUM CHLORIDE ER 10 MEQ PO TBCR: 20.0000 meq | EXTENDED_RELEASE_TABLET | Freq: Every day | ORAL | Status: DC

## 2014-06-07 MED ORDER — ENSURE COMPLETE PO LIQD: 237.0000 mL | Freq: Two times a day (BID) | ORAL | Status: DC

## 2014-06-07 MED ORDER — FUROSEMIDE 40 MG PO TABS: 40.0000 mg | ORAL_TABLET | Freq: Every day | ORAL | Status: DC

## 2014-06-07 MED ORDER — CEPHALEXIN 500 MG PO CAPS: 500.0000 mg | ORAL_CAPSULE | Freq: Two times a day (BID) | ORAL | Status: DC

## 2014-06-07 MED ORDER — TRAMADOL HCL 50 MG PO TABS: 50.0000 mg | ORAL_TABLET | Freq: Four times a day (QID) | ORAL | Status: DC | PRN

## 2014-06-07 MED ORDER — CEPHALEXIN 500 MG PO CAPS
500.0000 mg | ORAL_CAPSULE | Freq: Two times a day (BID) | ORAL | Status: DC
  Administered 2014-06-07: 500 mg via ORAL
  Filled 2014-06-07 (×2): qty 1

## 2014-06-07 NOTE — Discharge Summary (Signed)
PATIENT DETAILS Name: [name] Age: 80 y.o. Sex: female Date of Birth: 07-Nov-1914 MRN: [id number]. Admitting Physician: Phillips Grout, MD JKD:TOIZT,IWPYK M, MD  Admit Date: 05/31/2014 Discharge date: 06/07/2014  Recommendations for Outpatient Follow-up:  1. Please repeat CBC/chemistries at next visit 2. Encourage use of compression stockings, refused Ace wrap  PRIMARY DISCHARGE DIAGNOSIS:  Principal Problem:   Sepsis Active Problems:   RENAL INSUFFICIENCY   Aortic valve disorder   Cellulitis   CKD (chronic kidney disease), stage III      PAST MEDICAL HISTORY: Past Medical History  Diagnosis Date  . Hypertension   . Hypothyroidism   . Glaucoma   . Acute bronchitis   . Palpitations   . Unspecified venous (peripheral) insufficiency   . Pure hypercholesterolemia   . GERD (gastroesophageal reflux disease)   . Diverticulosis of colon (without mention of hemorrhage)   . Benign neoplasm of colon   . Renal insufficiency   . DJD (degenerative joint disease)   . Vitamin D deficiency disease   . Anxiety state, unspecified   . CKD (chronic kidney disease), stage III 10/06/2013    DISCHARGE MEDICATIONS:   Medication List         aspirin 81 MG tablet  Take 81 mg by mouth daily.     cephALEXin 500 MG capsule  Commonly known as:  KEFLEX  Take 1 capsule (500 mg total) by mouth every 12 (twelve) hours.     feeding supplement (ENSURE COMPLETE) Liqd  Take 237 mLs by mouth 2 (two) times daily between meals.     furosemide 40 MG tablet  Commonly known as:  LASIX  Take 1 tablet (40 mg total) by mouth daily.     hydrOXYzine 10 MG tablet  Commonly known as:  ATARAX/VISTARIL  Take 1-2 tablets by mouth every 6 hours as needed for itching     levothyroxine 125 MCG tablet  Commonly known as:  SYNTHROID, LEVOTHROID  Take 62.5 mcg by mouth daily before breakfast.     loratadine 10 MG tablet  Commonly known as:  CLARITIN  Take 10 mg by mouth daily.     omeprazole  20 MG capsule  Commonly known as:  PRILOSEC  Take 20 mg by mouth daily.     potassium chloride 10 MEQ tablet  Commonly known as:  K-DUR  Take 2 tablets (20 mEq total) by mouth daily.     SYSTANE 0.4-0.3 % Soln  Generic drug:  Polyethyl Glycol-Propyl Glycol  Place 1 drop into both eyes 4 (four) times daily.     timolol 0.5 % ophthalmic solution  Commonly known as:  TIMOPTIC  Place 1 drop into both eyes 2 (two) times daily.     traMADol 50 MG tablet  Commonly known as:  ULTRAM  Take 1 tablet (50 mg total) by mouth every 6 (six) hours as needed for moderate pain or severe pain.     triamcinolone 0.025 % cream  Commonly known as:  KENALOG  Apply 1 application topically 2 (two) times daily.     TYLENOL 325 MG tablet  Generic drug:  acetaminophen  Take 325 mg by mouth every 4 (four) hours as needed for mild pain or moderate pain.     vitamin C 500 MG tablet  Commonly known as:  ASCORBIC ACID  Take 500 mg by mouth daily.     Vitamin D 1000 UNITS capsule  Take 1,000 Units by mouth daily.        ALLERGIES:  Allergies  Allergen Reactions  . Enablex [Darifenacin Hydrobromide Er] Other (See Comments)    Caused her throat and mouth to have bumps and feel like it was swelling  . Clarithromycin Swelling    REACTION: causes her mouth to swell  . Codeine Nausea Only  . Morphine Nausea And Vomiting  . Sulfonamide Derivatives Other (See Comments)    REACTION: unsure of reaction    BRIEF HPI:  See H&P, Labs, Consult and Test reports for all details in brief, patient is a 80 year old female who presented to hospital with complaints of fever, in the ED she was noted to have hypotension, and left leg cellulitis. She was then admitted for further evaluation and treatment  CONSULTATIONS:   ID  PERTINENT RADIOLOGIC STUDIES: Dg Chest 2 View  06/03/2014   CLINICAL DATA:  Difficulty breathing  EXAM: CHEST  2 VIEW  COMPARISON:  May 31, 2014  FINDINGS: There is a small pleural  effusion on the left with left base atelectasis. There is underlying emphysema. There is a new area of ill-defined opacity in the right upper lobe suspicious for early pneumonia. Elsewhere, lungs are clear.  Heart is mildly enlarged with pulmonary vascularity within normal limits. No adenopathy. Bones are osteoporotic.  IMPRESSION: New area of infiltrate right upper lobe. Small left effusion with left base atelectasis. Underlying emphysema. Mild cardiac enlargement.   Electronically Signed   By: Lowella Grip M.D.   On: 06/03/2014 09:47   Mr Tibia Fibula Left W Wo Contrast  06/04/2014   CLINICAL DATA:  Pain and swelling.  EXAM: MRI OF LOWER LEFT EXTREMITY WITHOUT AND WITH CONTRAST  TECHNIQUE: Multiplanar, multisequence MR imaging of the lower left extremity was performed both before and after administration of intravenous contrast.  CONTRAST:  19mL MULTIHANCE GADOBENATE DIMEGLUMINE 529 MG/ML IV SOLN  COMPARISON:  None.  FINDINGS: There is diffuse subcutaneous soft tissue swelling/ edema and fluid involving the entire left lower extremity below the knee. No discrete drainable abscess is identified. No findings for septic arthritis or osteomyelitis. No findings for myofasciitis.  IMPRESSION: Severe cellulitis but no findings for focal soft tissue abscess, septic arthritis or osteomyelitis.   Electronically Signed   By: Kalman Jewels M.D.   On: 06/04/2014 21:29   Dg Chest Portable 1 View  05/31/2014   CLINICAL DATA:  Nausea, emesis, chills, fever.  EXAM: PORTABLE CHEST - 1 VIEW  COMPARISON:  Chest radiograph 10/07/2013  FINDINGS: Cardiac leads project over the chest. Stable heart, mediastinal, and hilar contours. Low lung volumes. Slightly increased prominence of the interstitial lung markings appears similar to prior exam.  Focal opacity in the lingula was not present on the prior chest radiograph. Negative for pneumothorax. Bones appear osteopenic.  IMPRESSION: Small focal opacity in the lingula.  Possibilities include focal atelectasis, small pleural effusion, or focal airspace disease related to pneumonia, given the clinical symptoms. Consider short-term follow-up two view chest radiograph if indicated.   Electronically Signed   By: Curlene Dolphin M.D.   On: 05/31/2014 19:37     PERTINENT LAB RESULTS: CBC:  Recent Labs  06/06/14 0851  WBC 11.7*  HGB 11.6*  HCT 36.2  PLT PLATELET CLUMPS NOTED ON SMEAR, UNABLE TO ESTIMATE   CMET CMP     Component Value Date/Time   NA 135* 06/04/2014 0700   K 4.3 06/04/2014 0700   CL 100 06/04/2014 0700   CO2 24 06/04/2014 0700   GLUCOSE 80 06/04/2014 0700   BUN 27* 06/04/2014 0700  CREATININE 0.94 06/04/2014 0700   CALCIUM 8.8 06/04/2014 0700   PROT 6.4 05/31/2014 1900   ALBUMIN 3.2* 05/31/2014 1900   AST 29 05/31/2014 1900   ALT 18 05/31/2014 1900   ALKPHOS 75 05/31/2014 1900   BILITOT 0.4 05/31/2014 1900   GFRNONAA 49* 06/04/2014 0700   GFRAA 57* 06/04/2014 0700    GFR Estimated Creatinine Clearance: 32 ml/min (by C-G formula based on Cr of 0.94). No results found for this basename: LIPASE, AMYLASE,  in the last 72 hours No results found for this basename: CKTOTAL, CKMB, CKMBINDEX, TROPONINI,  in the last 72 hours No components found with this basename: POCBNP,  No results found for this basename: DDIMER,  in the last 72 hours No results found for this basename: HGBA1C,  in the last 72 hours No results found for this basename: CHOL, HDL, LDLCALC, TRIG, CHOLHDL, LDLDIRECT,  in the last 72 hours No results found for this basename: TSH, T4TOTAL, FREET3, T3FREE, THYROIDAB,  in the last 72 hours No results found for this basename: VITAMINB12, FOLATE, FERRITIN, TIBC, IRON, RETICCTPCT,  in the last 72 hours Coags: No results found for this basename: PT, INR,  in the last 72 hours Microbiology: Recent Results (from the past 240 hour(s))  CULTURE, BLOOD (ROUTINE X 2)     Status: None   Collection Time    05/31/14  9:01 PM      Result Value Ref Range  Status   Specimen Description BLOOD ARM LEFT   Final   Special Requests BOTTLES DRAWN AEROBIC AND ANAEROBIC 10CC   Final   Culture  Setup Time     Final   Value: 06/01/2014 01:52     Performed at [facility]   Culture     Final   Value:        BLOOD CULTURE RECEIVED NO GROWTH TO DATE CULTURE WILL BE HELD FOR 5 DAYS BEFORE ISSUING A FINAL NEGATIVE REPORT     Performed at [facility]   Report Status PENDING   Incomplete  MRSA PCR SCREENING     Status: None   Collection Time    06/01/14  2:03 AM      Result Value Ref Range Status   MRSA by PCR NEGATIVE  NEGATIVE Final   Comment:            The GeneXpert MRSA Assay (FDA     approved for NASAL specimens     only), is one component of a     comprehensive MRSA colonization     surveillance program. It is not     intended to diagnose MRSA     infection nor to guide or     monitor treatment for     MRSA infections.     BRIEF HOSPITAL COURSE:   Principal Problem: Sepsis  - Patient was admitted, started on IV fluids, empiric vancomycin and cefepime. With these measures sepsis pathophysiology has resolved-BP stable, clinically non toxic looking- leukocytosis now downtrending and almost has normalized. -Llikely secondary to LLE Cellulitis. No other foci of infection apparent-UA neg for UTI, CXR ?PNA-but no symptoms present.  -Blood culture on 9/8 neg so far. Has completed 8 days of vancomycin, seen by infectious disease, recommendations are for 7 more days of Keflex.  Active Problems: Left leg cellulitis - Likely causing above. - Improved with empiric vancomycin and cefepime, however still erythematous at the time of discharge although much improved than on initial presentation. Given slow improvement, significant  leukocytosis, a MRI of the left leg was done on 9/12 which was negative for abscess/mysositis-shows cellulitis. Doppler of the left leg was negative for DVT. Has completed 8 days of vancomycin at the time of  discharge, will prescribe Keflex for 7 days on discharge. Suspect slow improvement likely secondary to advanced age, poor circulation, lymphangitis. Offered Ace wrap, however patient claims that she is intolerant to a tight compression, we did try to place on TED hose while inpatient-however it causes her to have worsening pain, she claims she has cold compression stockings at home that she will use. Patient claims she takes 80 mg of Lasix daily at home, will discharge him to 40 mg to help with edema.  CKD stage III  -Renal function improving w/ volume -creatinine now close to usual baseline. Please check periodically will as outpatient  Hypothyroidism  -c/w Levothyroxine   Moderate Malnutrition  -c/w Supplements on discharge  TODAY-DAY OF DISCHARGE:  Subjective:   [place] today has no headache,no chest abdominal pain,no new weakness tingling or numbness, feels much better wants to go home today.   Objective:   Blood pressure 152/43, pulse 88, temperature 99.4 F (37.4 C), temperature source Oral, resp. rate 18, height 5\' 4"  (1.626 m), weight 66.1 kg (145 lb 11.6 oz), SpO2 98.00%.  Intake/Output Summary (Last 24 hours) at 06/07/14 1011 Last data filed at 06/07/14 0538  Gross per 24 hour  Intake    400 ml  Output    380 ml  Net     20 ml   Filed Weights   06/05/14 0500 06/06/14 0510 06/07/14 0448  Weight: 66.225 kg (146 lb) 67.132 kg (148 lb) 66.1 kg (145 lb 11.6 oz)    Exam Awake Alert, Oriented *3, No new F.N deficits, Normal affect [place].AT,PERRAL Supple Neck,No JVD, No cervical lymphadenopathy appriciated.  Symmetrical Chest wall movement, Good air movement bilaterally, CTAB RRR,No Gallops,Rubs or new Murmurs, No Parasternal Heave +ve B.Sounds, Abd Soft, Non tender, No organomegaly appriciated, No rebound -guarding or rigidity. No Cyanosis, Clubbing or edema, No new Rash or bruise. Still with erythema in the left leg, mildly tender area in the posterior  cough.  DISCHARGE CONDITION: Stable  DISPOSITION: Home with home health services  DISCHARGE INSTRUCTIONS:    Activity:  As tolerated with Full fall precautions use walker/cane & assistance as needed  Diet recommendation: Heart Healthy diet      Discharge Instructions   Call MD for:  redness, tenderness, or signs of infection (pain, swelling, redness, odor or green/yellow discharge around incision site)    Complete by:  As directed      Call MD for:  severe uncontrolled pain    Complete by:  As directed      Diet - low sodium heart healthy    Complete by:  As directed      Discharge instructions    Complete by:  As directed   Apply compression stockings-if patient can tolerate     Increase activity slowly    Complete by:  As directed            Follow-up Information   Follow up with [facility]. ([facility])    Contact information:   [address] [place] [address] [phone]       Follow up with NADEL,SCOTT M, MD. Schedule an appointment as soon as possible for a visit in 1 week.   Specialty:  Pulmonary Disease   Contact information:   [address]  [place] [id number] [phone]      Total Time spent on discharge equals 45 minutes.  SignedOren Binet 06/07/2014 10:11 AM  **Disclaimer: This note may have been dictated with voice recognition software. Similar sounding words can inadvertently be transcribed and this note may contain transcription errors which may not have been corrected upon publication of note.**

## 2014-06-07 NOTE — Progress Notes (Signed)
[name] to be D/C'd Home per MD order.  Discussed with the patient and all questions fully answered.    Medication List         aspirin 81 MG tablet  Take 81 mg by mouth daily.     cephALEXin 500 MG capsule  Commonly known as:  KEFLEX  Take 1 capsule (500 mg total) by mouth every 12 (twelve) hours.     feeding supplement (ENSURE COMPLETE) Liqd  Take 237 mLs by mouth 2 (two) times daily between meals.     furosemide 40 MG tablet  Commonly known as:  LASIX  Take 1 tablet (40 mg total) by mouth daily.     hydrOXYzine 10 MG tablet  Commonly known as:  ATARAX/VISTARIL  Take 1-2 tablets by mouth every 6 hours as needed for itching     levothyroxine 125 MCG tablet  Commonly known as:  SYNTHROID, LEVOTHROID  Take 62.5 mcg by mouth daily before breakfast.     loratadine 10 MG tablet  Commonly known as:  CLARITIN  Take 10 mg by mouth daily.     omeprazole 20 MG capsule  Commonly known as:  PRILOSEC  Take 20 mg by mouth daily.     potassium chloride 10 MEQ tablet  Commonly known as:  K-DUR  Take 2 tablets (20 mEq total) by mouth daily.     SYSTANE 0.4-0.3 % Soln  Generic drug:  Polyethyl Glycol-Propyl Glycol  Place 1 drop into both eyes 4 (four) times daily.     timolol 0.5 % ophthalmic solution  Commonly known as:  TIMOPTIC  Place 1 drop into both eyes 2 (two) times daily.     traMADol 50 MG tablet  Commonly known as:  ULTRAM  Take 1 tablet (50 mg total) by mouth every 6 (six) hours as needed for moderate pain or severe pain.     triamcinolone 0.025 % cream  Commonly known as:  KENALOG  Apply 1 application topically 2 (two) times daily.     TYLENOL 325 MG tablet  Generic drug:  acetaminophen  Take 325 mg by mouth every 4 (four) hours as needed for mild pain or moderate pain.     vitamin C 500 MG tablet  Commonly known as:  ASCORBIC ACID  Take 500 mg by mouth daily.     Vitamin D 1000 UNITS capsule  Take 1,000 Units by mouth daily.        VVS,  Skin clean, dry and intact without evidence of skin break down, no evidence of skin tears noted. IV catheter discontinued intact. Site without signs and symptoms of complications. Dressing and pressure applied.  An After Visit Summary was printed and given to the patient.  D/c education completed with patient/family including follow up instructions, medication list, d/c activities limitations if indicated, with other d/c instructions as indicated by MD - patient able to verbalize understanding, all questions fully answered.   Patient instructed to return to ED, call 911, or call MD for any changes in condition.   Patient escorted via [place], and D/C home via private auto.  Audria Nine F 06/07/2014 3:26 PM

## 2014-06-08 ENCOUNTER — Telehealth: Payer: Self-pay | Admitting: Pulmonary Disease

## 2014-06-08 DIAGNOSIS — L03116 Cellulitis of left lower limb: Secondary | ICD-10-CM | POA: Diagnosis not present

## 2014-06-08 DIAGNOSIS — IMO0001 Reserved for inherently not codable concepts without codable children: Secondary | ICD-10-CM | POA: Diagnosis not present

## 2014-06-08 DIAGNOSIS — I129 Hypertensive chronic kidney disease with stage 1 through stage 4 chronic kidney disease, or unspecified chronic kidney disease: Secondary | ICD-10-CM | POA: Diagnosis not present

## 2014-06-08 DIAGNOSIS — N183 Chronic kidney disease, stage 3 (moderate): Secondary | ICD-10-CM | POA: Diagnosis not present

## 2014-06-08 DIAGNOSIS — A419 Sepsis, unspecified organism: Secondary | ICD-10-CM | POA: Diagnosis not present

## 2014-06-08 DIAGNOSIS — L02419 Cutaneous abscess of limb, unspecified: Secondary | ICD-10-CM | POA: Diagnosis not present

## 2014-06-08 DIAGNOSIS — I872 Venous insufficiency (chronic) (peripheral): Secondary | ICD-10-CM | POA: Diagnosis not present

## 2014-06-08 DIAGNOSIS — M79662 Pain in left lower leg: Secondary | ICD-10-CM | POA: Diagnosis not present

## 2014-06-08 DIAGNOSIS — L03119 Cellulitis of unspecified part of limb: Secondary | ICD-10-CM

## 2014-06-08 NOTE — Telephone Encounter (Signed)
Spoke with [facility] and scheduled HFU for 06/14/14 at 2:30  Nothing further needed

## 2014-06-10 DIAGNOSIS — N183 Chronic kidney disease, stage 3 (moderate): Secondary | ICD-10-CM | POA: Diagnosis not present

## 2014-06-10 DIAGNOSIS — L03116 Cellulitis of left lower limb: Secondary | ICD-10-CM | POA: Diagnosis not present

## 2014-06-10 DIAGNOSIS — I872 Venous insufficiency (chronic) (peripheral): Secondary | ICD-10-CM | POA: Diagnosis not present

## 2014-06-10 DIAGNOSIS — A419 Sepsis, unspecified organism: Secondary | ICD-10-CM | POA: Diagnosis not present

## 2014-06-10 DIAGNOSIS — I129 Hypertensive chronic kidney disease with stage 1 through stage 4 chronic kidney disease, or unspecified chronic kidney disease: Secondary | ICD-10-CM | POA: Diagnosis not present

## 2014-06-10 DIAGNOSIS — M79662 Pain in left lower leg: Secondary | ICD-10-CM | POA: Diagnosis not present

## 2014-06-14 ENCOUNTER — Ambulatory Visit (INDEPENDENT_AMBULATORY_CARE_PROVIDER_SITE_OTHER): Payer: Medicare Other | Admitting: Pulmonary Disease

## 2014-06-14 ENCOUNTER — Encounter: Payer: Self-pay | Admitting: Pulmonary Disease

## 2014-06-14 ENCOUNTER — Other Ambulatory Visit (INDEPENDENT_AMBULATORY_CARE_PROVIDER_SITE_OTHER): Payer: Medicare Other

## 2014-06-14 VITALS — BP 146/60 | HR 70 | Temp 97.7°F | Ht 60.0 in | Wt 134.8 lb

## 2014-06-14 DIAGNOSIS — E7800 Pure hypercholesterolemia, unspecified: Secondary | ICD-10-CM

## 2014-06-14 DIAGNOSIS — M79662 Pain in left lower leg: Secondary | ICD-10-CM | POA: Diagnosis not present

## 2014-06-14 DIAGNOSIS — K8050 Calculus of bile duct without cholangitis or cholecystitis without obstruction: Secondary | ICD-10-CM

## 2014-06-14 DIAGNOSIS — A419 Sepsis, unspecified organism: Secondary | ICD-10-CM | POA: Diagnosis not present

## 2014-06-14 DIAGNOSIS — M545 Low back pain, unspecified: Secondary | ICD-10-CM

## 2014-06-14 DIAGNOSIS — I129 Hypertensive chronic kidney disease with stage 1 through stage 4 chronic kidney disease, or unspecified chronic kidney disease: Secondary | ICD-10-CM | POA: Diagnosis not present

## 2014-06-14 DIAGNOSIS — K5730 Diverticulosis of large intestine without perforation or abscess without bleeding: Secondary | ICD-10-CM

## 2014-06-14 DIAGNOSIS — I872 Venous insufficiency (chronic) (peripheral): Secondary | ICD-10-CM

## 2014-06-14 DIAGNOSIS — I359 Nonrheumatic aortic valve disorder, unspecified: Secondary | ICD-10-CM

## 2014-06-14 DIAGNOSIS — L02419 Cutaneous abscess of limb, unspecified: Secondary | ICD-10-CM

## 2014-06-14 DIAGNOSIS — L03116 Cellulitis of left lower limb: Secondary | ICD-10-CM

## 2014-06-14 DIAGNOSIS — R609 Edema, unspecified: Secondary | ICD-10-CM

## 2014-06-14 DIAGNOSIS — M1990 Unspecified osteoarthritis, unspecified site: Secondary | ICD-10-CM

## 2014-06-14 DIAGNOSIS — K219 Gastro-esophageal reflux disease without esophagitis: Secondary | ICD-10-CM

## 2014-06-14 DIAGNOSIS — N183 Chronic kidney disease, stage 3 (moderate): Secondary | ICD-10-CM | POA: Diagnosis not present

## 2014-06-14 DIAGNOSIS — D638 Anemia in other chronic diseases classified elsewhere: Secondary | ICD-10-CM

## 2014-06-14 DIAGNOSIS — I10 Essential (primary) hypertension: Secondary | ICD-10-CM

## 2014-06-14 DIAGNOSIS — L03119 Cellulitis of unspecified part of limb: Secondary | ICD-10-CM

## 2014-06-14 DIAGNOSIS — E039 Hypothyroidism, unspecified: Secondary | ICD-10-CM

## 2014-06-14 DIAGNOSIS — N259 Disorder resulting from impaired renal tubular function, unspecified: Secondary | ICD-10-CM

## 2014-06-14 DIAGNOSIS — F411 Generalized anxiety disorder: Secondary | ICD-10-CM

## 2014-06-14 LAB — BASIC METABOLIC PANEL
BUN: 22 mg/dL (ref 6–23)
CO2: 30 mEq/L (ref 19–32)
Calcium: 8.9 mg/dL (ref 8.4–10.5)
Chloride: 93 mEq/L — ABNORMAL LOW (ref 96–112)
Creatinine, Ser: 1.4 mg/dL — ABNORMAL HIGH (ref 0.4–1.2)
GFR: 35.77 mL/min — ABNORMAL LOW (ref >60.00)
Glucose, Bld: 103 mg/dL — ABNORMAL HIGH (ref 70–99)
Potassium: 4.9 mEq/L (ref 3.5–5.1)
Sodium: 131 mEq/L — ABNORMAL LOW (ref 135–145)

## 2014-06-14 LAB — CBC WITH DIFFERENTIAL/PLATELET
Basophils Absolute: 0 10*3/uL (ref 0.0–0.1)
Basophils Relative: 0.4 % (ref 0.0–3.0)
Eosinophils Absolute: 0.1 10*3/uL (ref 0.0–0.7)
Eosinophils Relative: 1.7 % (ref 0.0–5.0)
HCT: 35.5 % — ABNORMAL LOW (ref 36.0–46.0)
Hemoglobin: 11.4 g/dL — ABNORMAL LOW (ref 12.0–15.0)
Lymphocytes Relative: 20.2 % (ref 12.0–46.0)
Lymphs Abs: 1.6 10*3/uL (ref 0.7–4.0)
MCHC: 32.2 g/dL (ref 30.0–36.0)
MCV: 90.7 fl (ref 78.0–100.0)
Monocytes Absolute: 0.8 10*3/uL (ref 0.1–1.0)
Monocytes Relative: 10.4 % (ref 3.0–12.0)
Neutro Abs: 5.3 10*3/uL (ref 1.4–7.7)
Neutrophils Relative %: 67.3 % (ref 43.0–77.0)
Platelets: 562 10*3/uL — ABNORMAL HIGH (ref 150.0–400.0)
RBC: 3.92 Mil/uL (ref 3.87–5.11)
RDW: 16.7 % — ABNORMAL HIGH (ref 11.5–15.5)
WBC: 7.9 10*3/uL (ref 4.0–10.5)

## 2014-06-14 LAB — SEDIMENTATION RATE: Sed Rate: 90 mm/hr — ABNORMAL HIGH (ref 0–22)

## 2014-06-14 MED ORDER — CEPHALEXIN 500 MG PO CAPS: 500.0000 mg | ORAL_CAPSULE | Freq: Two times a day (BID) | ORAL | Status: DC

## 2014-06-14 NOTE — Patient Instructions (Signed)
Today we updated your med list in our EPIC system...    Continue your current medications the same...  We refilled the Keflex (generic Cephalexin) to take 500mg  twice daily...    Be sure to take an OTC ALIGN daily...    And consider taking Activia Yogurt as well...  Increase your LASIX 40mg  to 2 tabs (80mg ) each AM as before...    Remember> NO SALT & keep your legs elevated...    Good job w/ the support hose...  We will arrange for an Infectious disease Team Consultation in their clinic at [facility] regarding your continued cellulitis...  Today we did some follow up blood work...    We will contact you w/ the results when available...   Let's plan a follow up visit in 3-4 weeks, sooner if needed for problems.Marland KitchenMarland Kitchen

## 2014-06-14 NOTE — Progress Notes (Signed)
Subjective:    Patient ID: [name], female    DOB: [birth year], 80 y.o.   MRN: [id number]  HPI 80 y/o WF here for a follow up visit... she has mult med problems including:  Hx asthmatic bronchitis;  HBP;  Ven Insuffic & edema;  Hyperchol;  Hypothyroid;  GERD/ Divertics/ Colon polyps;  Renal insuffic;  DJD;  Vit D defic;  Anxiety...   ~  May 22,2013:  37moROV & [name] is feeling well, no new complaints or concerns;  Her main prob= arthritis esp hands & bilat hips- she is followed by DrDeveshwar & seen recently w/ shot in painful right hip; prev had ESI in back from DrYates; prev Uric= 6.0 on Allopurinol, Colchicine, Vicodin... We reviewed her prob list, meds, xrays and labs> see below>>  LABS 5/13:  FLP- at goals w/ good HDL on Simva20;  Chems- wnl;  CBC- ok w/ Hg=12.4..[name]  ~  June 17, 2012:  452moOV & she remains stable> had some vertigo- improved w/ Antivert prn; she is c/o some incr pain in buttock, right hip & down right leg; hx prev ESI from DrYates in past- she has Vicodin for prn use & it helps some; she wants to wait for f/u appt DrDeveshwar before deciding what to do...  Notes that her breathing is OK "I'm on the go all the time";  BP= 140/60 & denies CP, palpit, ch in SOB or edema...     We reviewed prob list, meds, xrays and labs> see below for updates >> OK Flu shot today...  ~  September 21, 2012:  41m22moV & Sherylann saw [facility] [place]/13 w/ c/o VI & incr edema> both legs were red, warm, tender- but she had no f/c/s or drainage; she had had foot surg w/ decr mobility & was on reg dose of Lasix40;  VenDopplers were neg for DVT & she was treated w/ DoxyBid for 10d- no improvement; she went to see Derm- DrJordan/ [place] & they changed her support hose "he found an ulcer" she says & treating w/ [employer] brand "hydrocolloid adhesive patch" Q2d; we also incr the Lasix to 21m17mfor the last month & she reports improved w/ decr redness, no drainage, decr edema "I can now fit into my shoes" she  says...     AB> stable, no recent exac, no regular meds required & she uses the Mucinex as needed...    HBP> controlled on Diltiazem180 & Furosemide 40mg4mm w/ BP=138/62 today & she denies CP, palpit, dizzy, ch inSOB, etc...    VI, Edema> she knows to elim sodium, elevate, wear support hose; she has been seen by [facility], DrEarly & there is nothing they can do; recently checked by Derm-Payton Mccallumones w/ patch dressing & support hose; edema decr w/ Lasix 21mg/51mab today shows Creat=1.5...    Judith KitchenMarland KitchenOL> prev followed by DrGegick; on Simva20 Qhs + low fat diet; last FLP 5/13 showed TChol 191, TG 81, HDL 82, LDL 93    Hypothy> stable on Synthroid 125mcg-44m tab daily; labs 1/13 showed TSH= 1.65     Renal Insuffic> Creat had prev increased to 1.8 on the Lasix 21mg; r31mt labs 9/12 by DrDeveshwar showed BUN=33, Creat=1.47, assoc w/ resolved edema therefore decr Lasix to 40mg- 1t90mam; Lab today shows BUN=30, Creat=1.5 on the Lasix80/d at present...    DJD> followed by DrDeveshwLadene Artistin prn, Allopurinol100- now 1.5tabs/d (Uric=5.9), & off Colcrys...     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  LABS 12/13:  Chems= ok w/ BUN=30, Creat=1.5..[name]  ~  December 21, 2012:  346moOV & [name] [facility] that [facility] the shield around it had an infection);  she has been on Lasix40-2tabsQam for the last 34m441moher wt is up 3# to 151# today;  BP is controlled w/ this & CardizemCD180, BP= 130/66;  Extremities show 2-3+ pitting in legs but chest is clear etc;  There is not a lot of wiggle room on her diuretics betw edema & renal insuffic- we decided to check labs & continue same Rx for now...    We reviewed prob list, meds, xrays and labs> see below for updates >>  LABS 3/14:  Chems stable w/ BUN=31, Creat=1.5, LFTs=wnl;  CBC- wnl;  TSH=4.28;  VitD=43... rec to  continue same meds.  ~  April 21, 2013:  41mo38mo  & [name] is actually much improved> weight down 10# w/ resolution of her edema & feeling better overall; c/o incr nasal congestion & drainage- we discussed OTC antihist Rx... We reviewed the following medical problems during today's office visit >>     Ophthalmology> followed at [facility] Glaucoma & hx corneal transplant w/ infection; she is on some new drops & doing satis she says...    AB> stable, no recent exac, no regular meds required & she uses the Mucinex as needed; she is c/o a runny nose & we discussed Antihist- Claritin, Zyrtek, Allegra as needed...    HBP> on Diltiazem180 & Furosemide 40mg38mm w/ BP=150/64 today & she denies CP, palpit, dizzy, ch in SOB, etc...    VI, Hx Edema> she knows to elim sodium, elevate, wear support hose, & the Lasix80; weight is down 10# today to 141# w/ resolution of edema; [name] (Derm) treated dermatitis w/ new cream & improved...     CHOL> prev followed by DrGegick; on Simva20 Qhs + low fat diet; last FLP 5/13 showed TChol 191, TG 81, HDL 82, LDL 93    Hypothy> on Synthroid 125mcg38m2 tab daily; labs 3/14 showed TSH= 4.28    Renal Insuffic> Creat had prev up to 1.8 on the Lasix 80mg; 12m1/14 showed BUN=30, Creat=1.5 on the Lasix80/d; repeat labs 6/14 by DrDeveshwar showed Cr=1.3    DJD, Gout> followed by DrDeveshwar on Vicodin prn, ?Allopurinol200- 1.5tabs/d (labs 6/14 Uric=4.5) per Rheum, & off Colcrys...    Anxiety> on Alpraz0.25mg pr65m We reviewed prob list, meds, xrays and labs> see below for updates >>   LABS 6/14 by DrDeveshwar:  Chems- wnl x Cr=1.3;  CBC- wnl w/ Hg=12.5;  Uric=4.5...  ~  NMarland Kitchenvember 24, 2014:  41mo ROV 55morginia is c/o back & leg pain, thinks she wants another epidural shot & says she will sched this herself; she is not using the Hydrocodone, just Tylenol & we discussed trial Tramadol in addition... She is also c/o dermatitis in legs- has seen DrJordan for this & will f/u w/ them  as well...     HBP> on Diltiazem180 & Furosemide 40mg-2Qam734mBP=126/64 today & she denies CP, palpit, dizzy, ch in SOB, etc...    VI, Hx Edema> she knows to elim sodium, elevate, wear support hose, & the Lasix80; weight is back up 8# today to 149# w/ mild edema; [name] (Derm) prev treated dermatitis w/ new cream & it  improved...     CHOL> on Simva20 Qhs + low fat diet; last FLP 5/13 showed TChol 191, TG 81, HDL 82, LDL 93... Needs to ret fasting for f/u blood work.    Hypothy> on Synthroid 185mg- 1/2 tab daily; labs 3/14 showed TSH= 4.28    Renal Insuffic> Creat had prev up to 1.8 on the Lasix 868m Lab 1/14 showed BUN=30, Creat=1.5 on the Lasix80/d; repeat labs 6/14 by DrDeveshwar showed Cr=1.3 & it is 1.3 today... We reviewed prob list, meds, xrays and labs> see below for updates >> ok 2014 Flu vacine today...   LABS 11/14:  Chems- ok w/ BS=108, Cr=1.3;  Uric=4.3 on Allopurinol100;  CBC- ok w/ Hg=12.6...  ~  December 15, 2013:  58m38moV & [facility] w/ RUQ pain & dx w/ gallstones, cholecystitis, & had ERCP w/ sphincterotomy (DrStark) & stone extraction; treated w/ Cipro, LFTs improved, course complicated by LE cellulitis requiring addition of Vanco IV; she had f/u DrCornett, [facility] 2/15> brief note, did not rec elective surg given her age... We reviewed the following medical problems during today's office visit >>     HBP> on Diltiazem180 & Furosemide 71m63mam w/ BP=118/70 today & she denies CP, palpit, dizzy, ch in SOB, etc...    VI, Hx Edema> she knows to elim sodium, elevate, wear support hose, & the Lasix80; weight is back down 9# today to 137#; treated in hosp 1/15 for cellulitis...    CHOL> on Simva20 Qhs + low fat diet; last FLP 5/13 showed TChol 191, TG 81, HDL 82, LDL 93... Needs to ret fasting for f/u blood work.    Hypothy> on Synthroid 125mc37m/2 tab daily; labs 3/14 showed TSH= 4.28    Renal Insuffic> Creat prev up to 1.8 on the Lasix 80mg;2m5 showed Cr betw  1.1-1.7    Derm> she has Psoriasis on her arms & has been evaluated by DrJordan/Jones; they also treated the LE cellulitis; Rec Atarax prn itching...    Anemia> Hosp 1/15 w/ gallstones and cholecystits; Hg= 7.5-9.4 We reviewed prob list, meds, xrays and labs> see below for updates >>   ~  June 14, 2014:  12mo RO75mopost hosp check> [place] [facility] w/ sudden fever to 103, chills, and left leg pain/ redness/ swelling=> c/w severe cellulitis; she was felt to be septic but no lesions on leg, no areas of broken skin (no topical culture material possible) and blood cultures were NEG;  CBC showed WBC up to 29K before improving to 11.7 by disch, mild anemia w/ Hg= 10-11 range, mild renal insuffic w/ BUN=39 & Cr=1.3, MRSA screen NEG, UA was clear, PCT not checked;  MRI of left leg showed diffuse SQ soft tissue swelling & edema, no discrete abscess identified, no osteomyelitis, no signs of septic arthritis, etc;  She was treated w/ empiric Vancomycin & Cefepime- improved clinically but leg remained red/ tender/ swollen copared to the uninvolved right leg;  VenDopplers were neg for DVT;  She was disch on the 7th day w/ PO KEFLEX 500mg Bi42m more days; she has been elev her leg, wearing support hose, her Lasix dose was cut from 80mgQam 33m0mg/d, o25m meds the same...     Since disch she has just about finished the Keflex but left leg is still markedly swollen vis-a-vie the right leg- and it is red, tender, 2-3+ pitting edema; no areas of broken skin identified; she denies systemic symptoms- no  fever now, BP is stable (146/60 today),etc;  I am reluctant to stop her antibiotics at this point w/ persistent signs & symptoms in her left leg; we will recheck LABS today and refill Keflex500Bid, and refer to ID Team for their consultation...    Other medical problems as noted above... NOTE: Prev CT Abd&Pelvis 1/12 showed bilateral low density adnexal lesions, larger on the left- demonstrated on  prior studies (On the right, lesion measures 3.1 cm, on the left, it measures 6.5 x 5.5 cm). No surrounding inflammatory changes are evident. Left-sided lesion has enlarged from pelvic ultrasound done 10/01/2004 at which time it measured 4.8 x 4.4 x 4.5 cm. Bilat renal cysts. Large left perianal lipoma.    We reviewed prob list, meds, xrays and labs> see below for updates >>   CXR 9/15 showed borderline cardiomeg, underlying chr lung dis, left base atx vs effusion, ?early RUL opac...   EKG 9/15 showed NSR w/ PACs, rate92, NSSTTWA, NAD...  MRI of left leg 9/15> diffuse SQ soft tissue swelling & edema, no discrete abscess identified, no osteomyelitis, no signs of septic arthritis, etc (c/w severe cellulitis).  Ven Dopplers 9/15> no evid of DVT or superficial thrombosis in left leg, enlarged LN's noted in left groin  LABS 9/15 in Hosp> Hg=10-11 range, WBC=29K=>11.7 by disch, BUN/Cr= 39/1.3 improved to 27/0.9 by disch, MRSA=neg, UA=clear...  LABS 9/15 in office f/u>  PLAN>> we will continue her Keflex500Bid, refer for ID Consult, proceed w/ f/u CT Abd&Pelvis to eval adenopathy & left pelvis...            Problem List:  GLAUCOMA (ICD-365.9) - hx mult eye problems w/ prev corneal transplant & glaucoma laser eye surg... [facility]  ASTHMATIC BRONCHITIS, ACUTE (ICD-466.0) - Hx AB requiring Rx w/ Depo/ Pred/ Mucinex/ Tussionex... ~  CXR 4/11 showed vague nodular densities on left... ~  8/11:  she went to an [facility] w/ cough, sputum, & dx w/ pneumonia rx w/ ZPak... ~  CXR 3/12 showed borderline cardiomeg, nodular opac left base, NAD... ~  Spring 2013: she notes some allergy symptoms... ~  CXR 1/15 showed mild cardiomeg, atherosclerotic & tortuous Ao, low lung vol & sl incr markings, NAD...   HYPERTENSION (ICD-401.9) - controlled on ASA $Remo'81mg'qGkGE$ /d,  CARTIAXT $RemoveB'180mg'zWTmJqoI$ /d, LASIX $RemoveB'40mg'xpPssSvp$ -  1-2Qam... ~  7/10:  labs showed BUN=54, Creat=2.0 & rec to decr diuretics in half= one  Demedex & 1/2 Aldactone. ~  11/10:  labs showed BUN=38, Creat=1.8 & rec to keep same meds. ~  8/11:  labs showed BUN=42, Creat=2.1, K=4.3 ~  1/12:  labs in ER showed BUN=56, Creat=2.67, therefore diuretics decr to Demadex20/d only. ~  3/12:  2DEcho showed mod LVH, norm sys function w/ EF=60-65% & no regional wall motion abn; Gr1DD, mibile density on AoV- prob lambl'e escrescence, mildMR. ~  5/12:  f/u labs in office showed BUN=45, Creat=1.8, on Lasix $Remove'80mg'WyhHzhT$  Qam for her edema... ~  9/12:  Labs by DrDeveshwar 9/12 showed BUN=33, Creat=1.47, edema resolved therefore decr Lasix to $Remove'40mg'goptRlz$ /d. ~  1/13:  BP= 160/76 but hasn't taken meds today; labs improved w/ BUN=26, Creat=1.3, BNP=50 ~  5/13:  BP= 140/78 & denies CP, palpit, SOB, edema; labs improved w/ BUN=28, Creat=1.2 ~  9/13:  BP= 140/60 & she denies CP, palpt, ch in SOB or edema... ~  12/13: controlled on Diltiazem180 & Furosemide $RemoveBefo'40mg'WThPzkMVZkQ$ -2Qam w/ BP=138/62 today & she denies CP, palpit, dizzy, ch inSOB, etc... ~  3/14:  BP= 130/66 on same meds; Labs stable as well w/ Cr=1.5, continue same... ~  7/14: on Diltiazem180 & Furosemide 40mg -2Qam w/ BP=150/64 today & she denies CP, palpit, dizzy, ch in SOB, etc. ~  11/14: BP= 126/64 on same meds, stable... ~  EKG 1/15 showed NSR, rate92, NSSTTWA, NAD... ~  3/15: on Diltiazem180 & Furosemide 40mg -2Qam w/ BP=118/70 today & she denies CP, palpit, dizzy, ch in SOB, etc.  PALPITATIONS, HX OF (ICD-V12.50)  VENOUS INSUFFICIENCY (ICD-459.81) - swelling controlled w/ diuretics, low sodium diet, elevation, support hose, etc... ~  She saw DrEarly [facility]- there was nothing he could do... ~  Swelling diminished w/ low sodium, elevation, support hose, & Lasix40 1-2tabs daily... ~  Nov-Dec2013: she knows to elim sodium, elevate, wear support hose; she has been seen by [facility], DrEarly & there is nothing they can do; recently checked by Payton Mccallum- DrJones w/ patch dressing & support hose; edema decr w/ Lasix 80mg /d; Lab today shows  Creat=1.5.[name]. ~  7/14:  she knows to elim sodium, elevate, wear support hose, & the Lasix80; weight is down 10# today to 141# w/ resolution of edema; [name] (Derm) treated dermatitis w/ new cream & improved. ~  11/14: wt is back up 8# to 149# today & she needs to elim sodium, elev legs, etc... ~  3/15: she knows to elim sodium, elevate, wear support hose, & the Lasix80; weight is back down 9# today to 137#...  HYPERCHOLESTEROLEMIA (ICD-272.0) - prev Rx'd by DrGegick... Now on ZOCOR 20mg /d (prev Crestor5 & Lescol from [facility]) ~  she brought labs from [facility] 5/10 (off med due to $$$) w/ TChol 285, TG 204, HDL 69, LDL 195... "he was mad at me" ~  FLP 5/11 from [facility] showed TChol 202, TG 60, HDL 112, LDL 99 ~  She is reminded (again) to come FASTING for f/u FLP... ~  [place] 5/13 on Simva20 showed TChol 191, TG 81, HDL 82, LDL 93  HYPOTHYROIDISM (ICD-244.9) - prev followed by DrGegick on SYNTHROID 190mcg- 1/2 daily... ~  pt brought labs from [facility] 5/10 w/ TSH= 2.90, FreeT4= 1.09 ~  labs 8/11 showed TSH= 1.87 ~  Labs 3/12 showed TSH= 2.81 ~  Labs 1/13 showed TSH= 1.65 ~  Labs 3/14 showed TSH= 4.28  GERD (ICD-530.81) - uses PRILOSEC 20mg /d... last EGD was 6/06 by DrPatterson showing 3cmHH, reflux...   DIVERTICULOSIS OF COLON (ICD-562.10) & COLONIC POLYPS (ICD-211.3) - last colonoscopy 6/06 was WNL- no abnormality seen...  GALLSTONE and CHOLECYSTITIS >>  ~  1/15: [name] was [facility] 1/13 - 10/11/13 w/ RUQ pain & dx w/ gallstones, cholecystitis, & had ERCP w/ sphincterotomy (DrStark) & stone extraction; treated w/ Cipro, LFTs improved, course complicated by LE cellulitis requiring addition of Vanco IV; she had f/u DrCornett, [facility] 2/15> brief note, did not rec elective surg given her age.  RENAL INSUFFICIENCY (ICD-588.9) - tough balance betw renal perfusion & diuresis for edema... ~  labs 10/08 showed BUN= 27, Creat= 1.5 ~  labs 8/09 showed BUN= 44, Creat= 1.9 ~  labs 2/10 showed BUN 46, Creat= 1.7 ~   labs 7/10 showed BUN= 54, Creat= 2.0 ~  labs 11/10 showed BUN= 38, Creat= 1.8 ~  labs 8/11 showed BUN= 42, Creat= 2.1 ~  labs in ER 1/12 showed BUN=56, Creat=2.67, rec decr diuretics to Demadex20mg /d only. ~  Labs 3/12 showed improved renal function w/ BUN=31, Creat=1.4... ~  Labs 5/12 on Lasix80 showed BUN=45, Creat=1.8 ~  Labs 9/12 by DrDeveshwar showed BUN=33, Creat=1.47 ~  Labs 1/13 showed BUN=26, Creat=1.3, BNP=50... On Lasix $Remove'40mg'mXQbXCi$  Qam. ~  Labs 5/13 showed BUN=28, Creat=1.2 ~  Labs 12/13 on Lasix80 showed BUN=30, Creat=1.5 ~  Labs 3/14 on Lasix80 showed BUN=31, Creat=1.5 ~  Labs 6/14 by DrDeveshwar on Lasix80 showed BUN=30, Cr=1.3 ~  Labs here 11/14 on Lasix80 showed BUN=31, Cr= 1.3 ~  She was [facility] 1/15 w/ cholecystitis & stones, s/p ERCP, and Creat- 1.1 - 1.7  DEGENERATIVE JOINT DISEASE (ICD-715.90) - this is her chief complaint- s/p right TKR in 1999,  left TKR 3/10... on MOBIC 7.$RemoveBe'5mg'yLdHUjtEC$  Prn & VICODIN Prn as well... followed by DrYates; and had right second toe amp by Arnette Norris 2010 for hammertoe + spurs... also had epidural steroid shot from [name] for LBP (without benefit she says)... ~  5/13:  She saw DrDeveshwar for Rheum w/ shot in hip & sl improved...  VITAMIN D DEFICIENCY (ICD-268.9) ~  labs 8/09 showed Vit D level = 12... rec- start Vit D 50,000 u weekly... ~  labs 2/10 showed Vit D level = 30... rec- continue Vit D 50K weekly... ~  labs 7/10 showed Vit D level = 39... rec- change to 1000 u OTC daily. ~  labs 8/11 showed Vit D level = 38... continue same. ~  Labs 3/14 showed Vit D level = 43... Continue 1000u daily.  ANXIETY (ICD-300.00) - on ALPRAZOLAM 0.$RemoveBeforeD'25mg'ClmDrxVIoeweLh$ Tid Prn... several deaths in the family... daughter who lives w/ her recently ill...  SHINGLES - she had right T9-10 shingles in 2011 & resolved w/ Valtrex, Pred, etc...   Past Surgical History  Procedure Laterality Date  . Cataract extraction    . Abdominal hysterectomy    . Breast biopsy      Benign  . Total  knee arthroplasty  1999    right  . Total knee arthroplasty  11/2008    left - Dr Lorin Mercy  . Toe amputation  08/2009    Right second toe - Dr Beola Cord  . Ercp N/A 10/06/2013    Procedure: ENDOSCOPIC RETROGRADE CHOLANGIOPANCREATOGRAPHY (ERCP);  Surgeon: Ladene Artist, MD;  Location: [place];  Service: Endoscopy;  Laterality: N/A;    Outpatient Encounter Prescriptions as of 06/14/2014  Medication Sig  . acetaminophen (TYLENOL) 325 MG tablet Take 325 mg by mouth every 4 (four) hours as needed for mild pain or moderate pain.   . Ascorbic Acid (VITAMIN C) 500 MG tablet Take 500 mg by mouth daily.    [name] aspirin 81 MG tablet Take 81 mg by mouth daily.    . cephALEXin (KEFLEX) 500 MG capsule Take 1 capsule (500 mg total) by mouth every 12 (twelve) hours.  . Cholecalciferol (VITAMIN D) 1000 UNITS capsule Take 1,000 Units by mouth daily.    . feeding supplement, ENSURE COMPLETE, (ENSURE COMPLETE) LIQD Take 237 mLs by mouth 2 (two) times daily between meals.  . furosemide (LASIX) 40 MG tablet Take 1 tablet (40 mg total) by mouth daily.  . hydrOXYzine (ATARAX/VISTARIL) 10 MG tablet Take 1-2 tablets by mouth every 6 hours as needed for itching  . levothyroxine (SYNTHROID, LEVOTHROID) 125 MCG tablet Take 62.5 mcg by mouth daily before breakfast.  . loratadine (CLARITIN) 10 MG tablet Take 10 mg by mouth daily.  [name] omeprazole (PRILOSEC) 20 MG capsule Take 20 mg by mouth daily.    Vladimir Faster Glycol-Propyl Glycol (SYSTANE) 0.4-0.3 % SOLN Place 1 drop into both eyes 4 (four) times daily.   . potassium chloride (K-DUR) 10 MEQ tablet Take 2 tablets (20 mEq  total) by mouth daily.  . timolol (TIMOPTIC) 0.5 % ophthalmic solution Place 1 drop into both eyes 2 (two) times daily.    . traMADol (ULTRAM) 50 MG tablet Take 1 tablet (50 mg total) by mouth every 6 (six) hours as needed for moderate pain or severe pain.  [name] triamcinolone (KENALOG) 0.025 % cream Apply 1 application topically 2 (two) times daily.     Allergies   Allergen Reactions  . Enablex [Darifenacin Hydrobromide Er] Other (See Comments)    Caused her throat and mouth to have bumps and feel like it was swelling  . Clarithromycin Swelling    REACTION: causes her mouth to swell  . Codeine Nausea Only  . Morphine Nausea And Vomiting  . Sulfonamide Derivatives Other (See Comments)    REACTION: unsure of reaction    Current Medications, Allergies, Past Medical History, Past Surgical History, Family History, and Social History were reviewed in [employer] record.    Review of Systems        See HPI - all other systems neg except as noted...  The patient complains of dyspnea on exertion, peripheral edema, muscle weakness, and difficulty walking.  The patient denies anorexia, fever, weight loss, weight gain, vision loss, decreased hearing, hoarseness, chest pain, syncope, prolonged cough, headaches, hemoptysis, abdominal pain, melena, hematochezia, severe indigestion/heartburn, hematuria, incontinence, genital sores, suspicious skin lesions, transient blindness, depression, unusual weight change, abnormal bleeding, enlarged lymph nodes, and angioedema.     Objective:   Physical Exam      WD, WN, 80 y/o WF in NAD... GENERAL:  Alert & oriented; pleasant & cooperative... HEENT:  [place]/AT, EOM- full, EACs-clear, TMs-wnl, NOSE-clear, THROAT-clear & wnl. NECK:  Supple w/ fairROM; no JVD; normal carotid impulses w/o bruits; no thyromegaly or nodules palpated; no lymphadenopathy. CHEST:  Clear, decr BS bilat w/o wheezing, rales, or rhonchi heard... HEART:  Regular Rhythm;  gr 1/6 SEM without rubs or gallops detected... ABDOMEN:  Soft & nontender; normal bowel sounds; no organomegaly or masses detected. EXT:  moderate arthritic changes; s/p bilat TKRs; mild varicose veins/ +venous insuffic/ 2+edema R>L s/p right second toe distal amputation... NEURO:  CN's intact;  no focal neuro deficits... DERM:  No lesions noted; no rash  etc...  RADIOLOGY DATA:  Reviewed in the EPIC EMR & discussed w/ the patient...  LABORATORY DATA:  Reviewed in the EPIC EMR & discussed w/ the patient...   Assessment & Plan:    Episode of cholecystitis, gallstones 1/15- s/p ERCP & improved w/o surg (DrStark, DrCornett)...   HBP>  Controlled on meds, continue same including the Lasix40- 2Qam...  Cardiac>  Prev DrRoss' note reviewed; see 2DEcho, Event Monitor reports no dangerous arrhythmias, continue meds...  Ven Insuffic/ EDEMA- improved>  evals by Cards & [facility]> "there is nothing they can do"; continue elevation, no salt, Lasix80 now, compression...  CHOL>  On Simva20 & FLP 5/13 looked good...  HYPOTHYROID>  Continue Synthroid 152mcg/d taking 1/2 tab daily...  GI>  Stable now s/p ERCP for gallstones; followed by DrStark now...  Renal Insuffic>  Careful w/ diuresis, NSAIDs etc; Creat varied 1.1 to 1.7  DJD>  She saw DrDeveshwar for her Rheum complaints==> shot in knee; DrYates/ Ernestina Patches have signed off; may need pain clinic....  Other problems as noted...   Patient's Medications  New Prescriptions   No medications on file  Previous Medications   ACETAMINOPHEN (TYLENOL) 325 MG TABLET    Take 325 mg by mouth every 4 (four) hours as needed  for mild pain or moderate pain.    ASCORBIC ACID (VITAMIN C) 500 MG TABLET    Take 500 mg by mouth daily.     ASPIRIN 81 MG TABLET    Take 81 mg by mouth daily.     CHOLECALCIFEROL (VITAMIN D) 1000 UNITS CAPSULE    Take 1,000 Units by mouth daily.     FEEDING SUPPLEMENT, ENSURE COMPLETE, (ENSURE COMPLETE) LIQD    Take 237 mLs by mouth 2 (two) times daily between meals.   HYDROXYZINE (ATARAX/VISTARIL) 10 MG TABLET    Take 1-2 tablets by mouth every 6 hours as needed for itching   LEVOTHYROXINE (SYNTHROID, LEVOTHROID) 125 MCG TABLET    Take 62.5 mcg by mouth daily before breakfast.   LORATADINE (CLARITIN) 10 MG TABLET    Take 10 mg by mouth daily.   OMEGA-3 FATTY ACIDS (FISH OIL) 1000 MG CAPS     Take 1 capsule by mouth daily.   OMEPRAZOLE (PRILOSEC) 20 MG CAPSULE    Take 20 mg by mouth daily.     POLYETHYL GLYCOL-PROPYL GLYCOL (SYSTANE) 0.4-0.3 % SOLN    Place 1 drop into both eyes 4 (four) times daily.    POTASSIUM CHLORIDE (K-DUR) 10 MEQ TABLET    Take 2 tablets (20 mEq total) by mouth daily.   TIMOLOL (TIMOPTIC) 0.5 % OPHTHALMIC SOLUTION    Place 1 drop into both eyes 2 (two) times daily.     TRAMADOL (ULTRAM) 50 MG TABLET    Take 1 tablet (50 mg total) by mouth every 6 (six) hours as needed for moderate pain or severe pain.   TRIAMCINOLONE (KENALOG) 0.025 % CREAM    Apply 1 application topically 2 (two) times daily.   Modified Medications   Modified Medication Previous Medication   CEPHALEXIN (KEFLEX) 500 MG CAPSULE cephALEXin (KEFLEX) 500 MG capsule      Take 1 capsule (500 mg total) by mouth 2 (two) times daily.    Take 1 capsule (500 mg total) by mouth every 12 (twelve) hours.   FUROSEMIDE (LASIX) 40 MG TABLET furosemide (LASIX) 40 MG tablet      Take 80 mg by mouth daily.    Take 1 tablet (40 mg total) by mouth daily.  Discontinued Medications   No medications on file

## 2014-06-15 ENCOUNTER — Other Ambulatory Visit: Payer: Self-pay | Admitting: Pulmonary Disease

## 2014-06-15 DIAGNOSIS — R609 Edema, unspecified: Secondary | ICD-10-CM

## 2014-06-15 DIAGNOSIS — D638 Anemia in other chronic diseases classified elsewhere: Secondary | ICD-10-CM | POA: Insufficient documentation

## 2014-06-16 DIAGNOSIS — I872 Venous insufficiency (chronic) (peripheral): Secondary | ICD-10-CM | POA: Diagnosis not present

## 2014-06-16 DIAGNOSIS — I129 Hypertensive chronic kidney disease with stage 1 through stage 4 chronic kidney disease, or unspecified chronic kidney disease: Secondary | ICD-10-CM | POA: Diagnosis not present

## 2014-06-16 DIAGNOSIS — M79662 Pain in left lower leg: Secondary | ICD-10-CM | POA: Diagnosis not present

## 2014-06-16 DIAGNOSIS — L03116 Cellulitis of left lower limb: Secondary | ICD-10-CM | POA: Diagnosis not present

## 2014-06-16 DIAGNOSIS — N183 Chronic kidney disease, stage 3 (moderate): Secondary | ICD-10-CM | POA: Diagnosis not present

## 2014-06-16 DIAGNOSIS — A419 Sepsis, unspecified organism: Secondary | ICD-10-CM | POA: Diagnosis not present

## 2014-06-20 ENCOUNTER — Ambulatory Visit (INDEPENDENT_AMBULATORY_CARE_PROVIDER_SITE_OTHER)
Admission: RE | Admit: 2014-06-20 | Discharge: 2014-06-20 | Disposition: A | Discharge status: Home / Self Care | Payer: Medicare Other | Source: Ambulatory Visit | Attending: Pulmonary Disease | Admitting: Pulmonary Disease

## 2014-06-20 DIAGNOSIS — R609 Edema, unspecified: Secondary | ICD-10-CM

## 2014-06-20 DIAGNOSIS — K8020 Calculus of gallbladder without cholecystitis without obstruction: Secondary | ICD-10-CM | POA: Diagnosis not present

## 2014-06-20 MED ORDER — IOHEXOL 300 MG/ML  SOLN
70.0000 mL | Freq: Once | INTRAMUSCULAR | Status: AC | PRN
  Administered 2014-06-20: 70 mL via INTRAVENOUS

## 2014-06-21 DIAGNOSIS — L03116 Cellulitis of left lower limb: Secondary | ICD-10-CM | POA: Diagnosis not present

## 2014-06-21 DIAGNOSIS — N183 Chronic kidney disease, stage 3 (moderate): Secondary | ICD-10-CM | POA: Diagnosis not present

## 2014-06-21 DIAGNOSIS — A419 Sepsis, unspecified organism: Secondary | ICD-10-CM | POA: Diagnosis not present

## 2014-06-21 DIAGNOSIS — I872 Venous insufficiency (chronic) (peripheral): Secondary | ICD-10-CM | POA: Diagnosis not present

## 2014-06-21 DIAGNOSIS — M79662 Pain in left lower leg: Secondary | ICD-10-CM | POA: Diagnosis not present

## 2014-06-21 DIAGNOSIS — I129 Hypertensive chronic kidney disease with stage 1 through stage 4 chronic kidney disease, or unspecified chronic kidney disease: Secondary | ICD-10-CM | POA: Diagnosis not present

## 2014-06-23 ENCOUNTER — Other Ambulatory Visit: Payer: Self-pay | Admitting: Internal Medicine

## 2014-06-23 DIAGNOSIS — I872 Venous insufficiency (chronic) (peripheral): Secondary | ICD-10-CM | POA: Diagnosis not present

## 2014-06-23 DIAGNOSIS — N183 Chronic kidney disease, stage 3 (moderate): Secondary | ICD-10-CM | POA: Diagnosis not present

## 2014-06-23 DIAGNOSIS — A419 Sepsis, unspecified organism: Secondary | ICD-10-CM | POA: Diagnosis not present

## 2014-06-23 DIAGNOSIS — I129 Hypertensive chronic kidney disease with stage 1 through stage 4 chronic kidney disease, or unspecified chronic kidney disease: Secondary | ICD-10-CM | POA: Diagnosis not present

## 2014-06-23 DIAGNOSIS — M79662 Pain in left lower leg: Secondary | ICD-10-CM | POA: Diagnosis not present

## 2014-06-23 DIAGNOSIS — L03116 Cellulitis of left lower limb: Secondary | ICD-10-CM | POA: Diagnosis not present

## 2014-07-05 ENCOUNTER — Encounter: Payer: Self-pay | Admitting: Pulmonary Disease

## 2014-07-05 ENCOUNTER — Ambulatory Visit (INDEPENDENT_AMBULATORY_CARE_PROVIDER_SITE_OTHER): Payer: Medicare Other | Admitting: Pulmonary Disease

## 2014-07-05 ENCOUNTER — Other Ambulatory Visit (INDEPENDENT_AMBULATORY_CARE_PROVIDER_SITE_OTHER): Payer: Medicare Other

## 2014-07-05 ENCOUNTER — Ambulatory Visit (INDEPENDENT_AMBULATORY_CARE_PROVIDER_SITE_OTHER)
Admission: RE | Admit: 2014-07-05 | Discharge: 2014-07-05 | Disposition: A | Discharge status: Home / Self Care | Payer: Medicare Other | Source: Ambulatory Visit | Attending: Pulmonary Disease | Admitting: Pulmonary Disease

## 2014-07-05 VITALS — BP 130/60 | HR 76 | Temp 98.3°F | Ht 60.0 in | Wt 132.2 lb

## 2014-07-05 DIAGNOSIS — R609 Edema, unspecified: Secondary | ICD-10-CM

## 2014-07-05 DIAGNOSIS — I10 Essential (primary) hypertension: Secondary | ICD-10-CM

## 2014-07-05 DIAGNOSIS — I359 Nonrheumatic aortic valve disorder, unspecified: Secondary | ICD-10-CM

## 2014-07-05 DIAGNOSIS — L03116 Cellulitis of left lower limb: Secondary | ICD-10-CM

## 2014-07-05 DIAGNOSIS — K219 Gastro-esophageal reflux disease without esophagitis: Secondary | ICD-10-CM

## 2014-07-05 DIAGNOSIS — K5730 Diverticulosis of large intestine without perforation or abscess without bleeding: Secondary | ICD-10-CM

## 2014-07-05 DIAGNOSIS — E039 Hypothyroidism, unspecified: Secondary | ICD-10-CM

## 2014-07-05 DIAGNOSIS — I872 Venous insufficiency (chronic) (peripheral): Secondary | ICD-10-CM | POA: Diagnosis not present

## 2014-07-05 DIAGNOSIS — K8020 Calculus of gallbladder without cholecystitis without obstruction: Secondary | ICD-10-CM | POA: Insufficient documentation

## 2014-07-05 DIAGNOSIS — E7800 Pure hypercholesterolemia, unspecified: Secondary | ICD-10-CM

## 2014-07-05 LAB — BASIC METABOLIC PANEL
BUN: 33 mg/dL — ABNORMAL HIGH (ref 6–23)
CALCIUM: 9.6 mg/dL (ref 8.4–10.5)
CO2: 26 mEq/L (ref 19–32)
Chloride: 93 mEq/L — ABNORMAL LOW (ref 96–112)
Creatinine, Ser: 1.3 mg/dL — ABNORMAL HIGH (ref 0.4–1.2)
GFR: 40.61 mL/min — AB (ref >60.00)
GLUCOSE: 84 mg/dL (ref 70–99)
Potassium: 4.3 mEq/L (ref 3.5–5.1)
SODIUM: 132 meq/L — AB (ref 135–145)

## 2014-07-05 LAB — CBC WITH DIFFERENTIAL/PLATELET
BASOS ABS: 0 10*3/uL (ref 0.0–0.1)
Basophils Relative: 0.4 % (ref 0.0–3.0)
EOS PCT: 0.8 % (ref 0.0–5.0)
Eosinophils Absolute: 0.1 10*3/uL (ref 0.0–0.7)
HCT: 38.2 % (ref 36.0–46.0)
HEMOGLOBIN: 12 g/dL (ref 12.0–15.0)
LYMPHS PCT: 33 % (ref 12.0–46.0)
Lymphs Abs: 2.5 10*3/uL (ref 0.7–4.0)
MCHC: 31.4 g/dL (ref 30.0–36.0)
MCV: 91.1 fl (ref 78.0–100.0)
MONOS PCT: 6.7 % (ref 3.0–12.0)
Monocytes Absolute: 0.5 10*3/uL (ref 0.1–1.0)
NEUTROS ABS: 4.5 10*3/uL (ref 1.4–7.7)
Neutrophils Relative %: 59.1 % (ref 43.0–77.0)
Platelets: 178 10*3/uL (ref 150.0–400.0)
RBC: 4.2 Mil/uL (ref 3.87–5.11)
RDW: 16.3 % — ABNORMAL HIGH (ref 11.5–15.5)
WBC: 7.6 10*3/uL (ref 4.0–10.5)

## 2014-07-05 LAB — SEDIMENTATION RATE: Sed Rate: 54 mm/hr — ABNORMAL HIGH (ref 0–22)

## 2014-07-05 MED ORDER — POTASSIUM CHLORIDE ER 10 MEQ PO TBCR: 20.0000 meq | EXTENDED_RELEASE_TABLET | Freq: Every day | ORAL | Status: DC

## 2014-07-05 MED ORDER — SIMVASTATIN 20 MG PO TABS: 20.0000 mg | ORAL_TABLET | Freq: Every day | ORAL | Status: DC

## 2014-07-05 MED ORDER — LEVOTHYROXINE SODIUM 125 MCG PO TABS: 62.5000 ug | ORAL_TABLET | Freq: Every day | ORAL | Status: DC

## 2014-07-05 NOTE — Progress Notes (Signed)
Subjective:    Patient ID: [name], female    DOB: [birth year], 80 y.o.   MRN: [id number]  HPI 80 y/o WF here for a follow up visit... she has mult med problems including:  Hx asthmatic bronchitis;  HBP;  Ven Insuffic & edema;  Hyperchol;  Hypothyroid;  GERD/ Divertics/ Colon polyps;  Renal insuffic;  DJD;  Vit D defic;  Anxiety...   ~  May 22,2013:  78moROV & Heavenly is feeling well, no new complaints or concerns;  Her main prob= arthritis esp hands & bilat hips- she is followed by DrDeveshwar & seen recently w/ shot in painful right hip; prev had ESI in back from DrYates; prev Uric= 6.0 on Allopurinol, Colchicine, Vicodin... We reviewed her prob list, meds, xrays and labs> see below>>  LABS 5/13:  FLP- at goals w/ good HDL on Simva20;  Chems- wnl;  CBC- ok w/ Hg=12.4..[name]  ~  June 17, 2012:  78moOV & she remains stable> had some vertigo- improved w/ Antivert prn; she is c/o some incr pain in buttock, right hip & down right leg; hx prev ESI from DrYates in past- she has Vicodin for prn use & it helps some; she wants to wait for f/u appt DrDeveshwar before deciding what to do...  Notes that her breathing is OK "I'm on the go all the time";  BP= 140/60 & denies CP, palpit, ch in SOB or edema...     We reviewed prob list, meds, xrays and labs> see below for updates >> OK Flu shot today...  ~  September 21, 2012:  78m78moV & [place] saw [facility] [place]/13 w/ c/o VI & incr edema> both legs were red, warm, tender- but she had no f/c/s or drainage; she had had foot surg w/ decr mobility & was on reg dose of Lasix40;  VenDopplers were neg for DVT & she was treated w/ DoxyBid for 10d- no improvement; she went to see Derm- DrJordan/ [place] & they changed her support hose "he found an ulcer" she says & treating w/ [employer] brand "hydrocolloid adhesive patch" Q2d; we also incr the Lasix to 21m17mfor the last month & she reports improved w/ decr redness, no drainage, decr edema "I can now fit into my shoes" she  says...     AB> stable, no recent exac, no regular meds required & she uses the Mucinex as needed...    HBP> controlled on Diltiazem180 & Furosemide 40mg4mm w/ BP=138/62 today & she denies CP, palpit, dizzy, ch inSOB, etc...    VI, Edema> she knows to elim sodium, elevate, wear support hose; she has been seen by [facility], DrEarly & there is nothing they can do; recently checked by Derm-Payton Mccallumones w/ patch dressing & support hose; edema decr w/ Lasix 21mg/51mab today shows Creat=1.5...    Marland KitchenMarland KitchenOL> prev followed by DrGegick; on Simva20 Qhs + low fat diet; last FLP 5/13 showed TChol 191, TG 81, HDL 82, LDL 93    Hypothy> stable on Synthroid 125mcg-78m tab daily; labs 1/13 showed TSH= 1.65     Renal Insuffic> Creat had prev increased to 1.8 on the Lasix 21mg; r78mt labs 9/12 by DrDeveshwar showed BUN=33, Creat=1.47, assoc w/ resolved edema therefore decr Lasix to 40mg- 1t90mam; Lab today shows BUN=30, Creat=1.5 on the Lasix78/d at present...    DJD> followed by DrDeveshwLadene Artistin prn, Allopurinol100- now 1.5tabs/d (Uric=5.9), & off Colcrys...     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  LABS 12/13:  Chems= ok w/ BUN=30, Creat=1.5..[name]  ~  December 21, 2012:  78moOV & [name] indicates that [facility] & the shield around it had an infection);  she has been on Lasix40-2tabsQam for the last 83m583moher wt is up 3# to 151# today;  BP is controlled w/ this & CardizemCD180, BP= 130/66;  Extremities show 2-3+ pitting in legs but chest is clear etc;  There is not a lot of wiggle room on her diuretics betw edema & renal insuffic- we decided to check labs & continue same Rx for now...    We reviewed prob list, meds, xrays and labs> see below for updates >>  LABS 3/14:  Chems stable w/ BUN=31, Creat=1.5, LFTs=wnl;  CBC- wnl;  TSH=4.28;  VitD=43... rec to  continue same meds.  ~  April 21, 2013:  78mo78mo  & [name] is actually much improved> weight down 10# w/ resolution of her edema & feeling better overall; c/o incr nasal congestion & drainage- we discussed OTC antihist Rx... We reviewed the following medical problems during today's office visit >>     Ophthalmology> followed at [facility] Glaucoma & hx corneal transplant w/ infection; she is on some new drops & doing satis she says...    AB> stable, no recent exac, no regular meds required & she uses the Mucinex as needed; she is c/o a runny nose & we discussed Antihist- Claritin, Zyrtek, Allegra as needed...    HBP> on Diltiazem180 & Furosemide 40mg10mm w/ BP=150/64 today & she denies CP, palpit, dizzy, ch in SOB, etc...    VI, Hx Edema> she knows to elim sodium, elevate, wear support hose, & the Lasix80; weight is down 10# today to 141# w/ resolution of edema; [name] (Derm) treated dermatitis w/ new cream & improved...     CHOL> prev followed by DrGegick; on Simva20 Qhs + low fat diet; last FLP 5/13 showed TChol 191, TG 81, HDL 82, LDL 93    Hypothy> on Synthroid 125mcg78m2 tab daily; labs 3/14 showed TSH= 4.28    Renal Insuffic> Creat had prev up to 1.8 on the Lasix 80mg; 68m1/14 showed BUN=30, Creat=1.5 on the Lasix80/d; repeat labs 6/14 by DrDeveshwar showed Cr=1.3    DJD, Gout> followed by DrDeveshwar on Vicodin prn, ?Allopurinol200- 1.5tabs/d (labs 6/14 Uric=4.5) per Rheum, & off Colcrys...    Anxiety> on Alpraz0.25mg pr27m We reviewed prob list, meds, xrays and labs> see below for updates >>   LABS 6/14 by DrDeveshwar:  Chems- wnl x Cr=1.3;  CBC- wnl w/ Hg=12.5;  Uric=4.5...  ~  NMarland Kitchenvember 24, 2014:  77mo ROV 2morginia is c/o back & leg pain, thinks she wants another epidural shot & says she will sched this herself; she is not using the Hydrocodone, just Tylenol & we discussed trial Tramadol in addition... She is also c/o dermatitis in legs- has seen DrJordan for this & will f/u w/ them  as well...     HBP> on Diltiazem180 & Furosemide 40mg-2Qam54mBP=126/64 today & she denies CP, palpit, dizzy, ch in SOB, etc...    VI, Hx Edema> she knows to elim sodium, elevate, wear support hose, & the Lasix80; weight is back up 8# today to 149# w/ mild edema; [name] (Derm) prev treated dermatitis w/ new cream & it  improved...     CHOL> on Simva20 Qhs + low fat diet; last FLP 5/13 showed TChol 191, TG 81, HDL 82, LDL 93... Needs to ret fasting for f/u blood work.    Hypothy> on Synthroid 142mg- 1/2 tab daily; labs 3/14 showed TSH= 4.28    Renal Insuffic> Creat had prev up to 1.8 on the Lasix 867m Lab 1/14 showed BUN=30, Creat=1.5 on the Lasix80/d; repeat labs 6/14 by DrDeveshwar showed Cr=1.3 & it is 1.3 today... We reviewed prob list, meds, xrays and labs> see below for updates >> ok 2014 Flu vacine today...   LABS 11/14:  Chems- ok w/ BS=108, Cr=1.3;  Uric=4.3 on Allopurinol100;  CBC- ok w/ Hg=12.6...  ~  December 15, 2013:  56m338moV & [facility] w/ RUQ pain & dx w/ gallstones, cholecystitis, & had ERCP w/ sphincterotomy (DrStark) & stone extraction; treated w/ Cipro, LFTs improved, course complicated by LE cellulitis requiring addition of Vanco IV; she had f/u DrCornett, [facility] 2/15> brief note, did not rec elective surg given her age... We reviewed the following medical problems during today's office visit >>     HBP> on Diltiazem180 & Furosemide 54m25mam w/ BP=118/70 today & she denies CP, palpit, dizzy, ch in SOB, etc...    VI, Hx Edema> she knows to elim sodium, elevate, wear support hose, & the Lasix80; weight is back down 9# today to 137#; treated in hosp 1/15 for cellulitis...    CHOL> on Simva20 Qhs + low fat diet; last FLP 5/13 showed TChol 191, TG 81, HDL 82, LDL 93... Needs to ret fasting for f/u blood work.    Hypothy> on Synthroid 125mc39m/2 tab daily; labs 3/14 showed TSH= 4.28    Renal Insuffic> Creat prev up to 1.8 on the Lasix 80mg;80m5 showed Cr betw  1.1-1.7    Derm> she has Psoriasis on her arms & has been evaluated by DrJordan/Jones; they also treated the LE cellulitis; Rec Atarax prn itching...    Anemia> Hosp 1/15 w/ gallstones and cholecystits; Hg= 7.5-9.4 We reviewed prob list, meds, xrays and labs> see below for updates >>   ~  June 14, 2014:  38mo RO9mopost hosp check> [place] [facility] w/ sudden fever to 103, chills, and left leg pain/ redness/ swelling=> c/w severe cellulitis; she was felt to be septic but no lesions on leg, no areas of broken skin (no topical culture material possible) and blood cultures were NEG;  CBC showed WBC up to 29K before improving to 11.7 by disch, mild anemia w/ Hg= 10-11 range, mild renal insuffic w/ BUN=39 & Cr=1.3, MRSA screen NEG, UA was clear, PCT not checked;  MRI of left leg showed diffuse SQ soft tissue swelling & edema, no discrete abscess identified, no osteomyelitis, no signs of septic arthritis, etc;  She was treated w/ empiric Vancomycin & Cefepime- improved clinically but leg remained red/ tender/ swollen copared to the uninvolved right leg;  VenDopplers were neg for DVT;  She was disch on the 7th day w/ PO KEFLEX 500mg Bi54m more days; she has been elev her leg, wearing support hose, her Lasix dose was cut from 80mgQam 30m0mg/d, o82m meds the same...     Since disch she has just about finished the Keflex but left leg is still markedly swollen vis-a-vie the right leg- and it is red, tender, 2-3+ pitting edema; no areas of broken skin identified; she denies systemic symptoms- no  fever now, BP is stable (146/60 today),etc;  I am reluctant to stop her antibiotics at this point w/ persistent signs & symptoms in her left leg; we will recheck LABS today and refill Keflex500Bid, and refer to ID Team for their consultation...    Other medical problems as noted above... NOTE: Prev CT Abd&Pelvis 1/12 showed bilateral low density adnexal lesions, larger on the left- demonstrated on  prior studies (On the right, lesion measures 3.1 cm, on the left, it measures 6.5 x 5.5 cm). No surrounding inflammatory changes are evident. Left-sided lesion has enlarged from pelvic ultrasound done 10/01/2004 at which time it measured 4.8 x 4.4 x 4.5 cm. Bilat renal cysts. Large left perianal lipoma.    We reviewed prob list, meds, xrays and labs> see below for updates >>   CXR 9/15 showed borderline cardiomeg, underlying chr lung dis, left base atx vs effusion, ?early RUL opac...   EKG 9/15 showed NSR w/ PACs, rate92, NSSTTWA, NAD...  MRI of left leg 9/15> diffuse SQ soft tissue swelling & edema, no discrete abscess identified, no osteomyelitis, no signs of septic arthritis, etc (c/w severe cellulitis).  Ven Dopplers 9/15> no evid of DVT or superficial thrombosis in left leg, enlarged LN's noted in left groin  LABS 9/15 in Hosp> Hg=10-11 range, WBC=29K=>11.7 by disch, BUN/Cr= 39/1.3 improved to 27/0.9 by disch, MRSA=neg, UA=clear...  LABS 9/15 in office f/u>  Chems- ok x Na=131, Cr=1.4;  CBC- ok x   CT Abd&Pelvis 9/15> showed  PLAN>> we will continue her Keflex500Bid, refer for ID Consult, proceed w/ f/u CT Abd&Pelvis to eval adenopathy & left pelvis...   ~  July 05, 2014:  3wk ROV and Angella's left leg has improved; she has finished the last round of oral Keflex and her left leg shows decr swelling (she has lost 2#), less red, less tender/ painful (still no sores or drainage), walking better, no f/c/s, etc;  She continues to minimize salt in her diet, elev leg, wear support hose, & has continued her Lasix40-2Qam + K10-2Qam...  She has not yet seen ID consult & she feels this would be a waste of time in light of her clinical improvement...  We decided to follow up on her [place] (CXR has cleared & back to baseline) & Labs (Cr=1.3, CBC is wnl, Sed improved to 54))... Rec to continue current meds/ continue leg cleansing/ elevation/ no salt/ support hose when up/ etc... ROV recheck in  66mo& sooner if needed for problems...    We reviewed prob list, meds, xrays and labs> see below for updates >>   CXR 101/15 showed clearing of patchy RUL opac & left effusion, lungs now clear & back to baseline, NAD...  LABS 101/15:  Chems- ok x Na=132, BUN=33, Cr=1.3;  CBC- ok w/ Hg=12.0, WBC=7.6;  Sed=54 (improved from 90)...           Problem List:  GLAUCOMA (ICD-365.9) - hx mult eye problems w/ prev corneal transplant & glaucoma laser eye surg... [facility]  ASTHMATIC BRONCHITIS, ACUTE (ICD-466.0) - Hx AB requiring Rx w/ Depo/ Pred/ Mucinex/ Tussionex... ~  CXR 4/11 showed vague nodular densities on left... ~  [facility]  [facility], [facility] & dx w/ pneumonia rx w/ ZPak... ~  CXR 3/12 showed borderline cardiomeg, nodular opac left base, NAD... ~  Spring 2013: she notes some allergy symptoms... ~  CXR 1/15 showed mild cardiomeg, atherosclerotic & tortuous  Ao, low lung vol & sl incr markings, NAD...   HYPERTENSION (ICD-401.9) - controlled on ASA 30m/d,  CARTIAXT 185md, LASIX 4073m 1-2Qam... ~  7/10:  labs showed BUN=54, Creat=2.0 & rec to decr diuretics in half= one Demedex & 1/2 Aldactone. ~  11/10:  labs showed BUN=38, Creat=1.8 & rec to keep same meds. ~  8/11:  labs showed BUN=42, Creat=2.1, K=4.3 ~  1/12:  labs in ER showed BUN=56, Creat=2.67, therefore diuretics decr to Demadex20/d only. ~  3/12:  2DEcho showed mod LVH, norm sys function w/ EF=60-65% & no regional wall motion abn; Gr1DD, mibile density on AoV- prob lambl'e escrescence, mildMR. ~  5/12:  f/u labs in office showed BUN=45, Creat=1.8, on Lasix 33m70mm for her edema... ~  9/12:  [facility] showed BUN=33, Creat=1.47, edema resolved therefore decr Lasix to 40mg46m~  1/13:  BP= 160/76 but hasn't taken meds today; labs improved w/ BUN=26, Creat=1.3, BNP=50 ~  5/13:  BP= 140/78 & denies CP, palpit, SOB, edema; labs improved w/ BUN=28, Creat=1.2 ~   9/13:  BP= 140/60 & she denies CP, palpt, ch in SOB or edema... ~  12/13: controlled on Diltiazem180 & Furosemide 40mg-50m w/ BP=138/62 today & she denies CP, palpit, dizzy, ch inSOB, etc... ~  3/14:  BP= 130/66 on same meds; Labs stable as well w/ Cr=1.5, continue same... ~  7/14: on Diltiazem180 & Furosemide 40mg-218mw/ BP=150/64 today & she denies CP, palpit, dizzy, ch in SOB, etc. ~  11/14: BP= 126/64 on same meds, stable... ~  EKG 1/15 showed NSR, rate92, NSSTTWA, NAD... ~  3/15: on Diltiazem180 & Furosemide 40mg-2Q26m/ BP=118/70 today & she denies CP, palpit, dizzy, ch in SOB, etc. ~  9/15: post hosp> off Diltiazem now & still on Furosemide 40mg- 1-77m & K10-2/d w/ BP=146/60 w/ leg swelling, edema, cellulitis as below...  PALPITATIONS, HX OF (ICD-V12.50)  VENOUS INSUFFICIENCY (ICD-459.81) - swelling controlled w/ diuretics, low sodium diet, elevation, support hose, etc... ~  She saw DrEarly [facility]- there was nothing he could do... ~  Swelling diminished w/ low sodium, elevation, support hose, & Lasix40 1-2tabs daily... ~  Nov-Dec2013: she knows to elim sodium, elevate, wear support hose; she has been seen by [facility], DrEarly & there is nothing they can do; recently checked by Derm- DrJPayton Mccallum w/ patch dressing & support hose; edema decr w/ Lasix 33mg/d; L47moday shows Creat=1.5... ~  7/14[name]  she knows to elim sodium, elevate, wear support hose, & the Lasix80; weight is down 10# today to 141# w/ resolution of edema; [name] (Derm) treated dermatitis w/ new cream & improved. ~  11/14: wt is back up 8# to 149# today & she needs to elim sodium, elev legs, etc... ~  3/15: she knows to elim sodium, elevate, wear support hose, & the Lasix80; weight is back down 9# today to 137#... ~  9//1[name]: [facility] w/ 4+edema etc; VenDopplers neg for DVT; no open wounds/ drainage; treated w/ IV antibiotics and disch on Keflex; has persistent erythema, pain, swelling & we decided to  continue the Keflex & refer to ID... ~  10/15: she is improved after the extended course of Keflex w/ decr erythema, swelling & discomfort; she did not see ID; plan is to continue same meds, cleansing, elevation, no salt, support hose...  HYPERCHOLESTEROLEMIA (ICD-272.0) - prev Rx'd by DrGegick... Now on ZOCOR 20mg/d (pr74mrestor5 & Lescol from DrGegick) [facility] (off med due to $$$) w/ TChol 285, TG 204, HDL 69, LDL 195... "he was mad at me" ~  FLP 5/11 [facility] showed TChol 202, TG 60, HDL 112, LDL 99 ~  She is reminded (again) to come FASTING for f/u FLP... ~  [place] 5/13 on Simva20 showed TChol 191, TG 81, HDL 82, LDL 93  HYPOTHYROIDISM (ICD-244.9) - prev followed by DrGegick on SYNTHROID 176mg- 1/2 daily... ~  [facility] w/ TSH= 2.90, FreeT4= 1.09 ~  labs 8/11 showed TSH= 1.87 ~  Labs 3/12 showed TSH= 2.81 ~  Labs 1/13 showed TSH= 1.65 ~  Labs 3/14 showed TSH= 4.28  GERD (ICD-530.81) - uses PRILOSEC 243md... last EGD was 6/06 by DrPatterson showing 3cmHH, reflux...   DIVERTICULOSIS OF COLON (ICD-562.10) & COLONIC POLYPS (ICD-211.3) - last colonoscopy 6/06 was WNL- no abnormality seen...  GALLSTONE and CHOLECYSTITIS >>  ~  [facility]13 w/ RUQ pain & dx w/ gallstones, cholecystitis, & had ERCP w/ sphincterotomy (DrStark) & stone extraction; treated w/ Cipro, LFTs improved, course complicated by LE cellulitis requiring addition of Vanco IV; she had f/u DrCornett, [facility] 2/15> brief note, did not rec elective surg given her age.  RENAL INSUFFICIENCY (ICD-588.9) - tough balance betw renal perfusion & diuresis for edema... ~  labs 10/08 showed BUN= 27, Creat= 1.5 ~  labs 8/09 showed BUN= 44, Creat= 1.9 ~  labs 2/10 showed BUN 46, Creat= 1.7 ~  labs 7/10 showed BUN= 54, Creat= 2.0 ~  labs 11/10 showed BUN= 38, Creat= 1.8 ~  labs 8/11 showed BUN= 42, Creat= 2.1 ~  labs in ER 1/12 showed BUN=56, Creat=2.67, rec decr  diuretics to Demadex2061m only. ~  Labs 3/12 showed improved renal function w/ BUN=31, Creat=1.4... ~  Labs 5/12 on Lasix80 showed BUN=45, Creat=1.8 ~  Labs 9/12 by DrDeveshwar showed BUN=33, Creat=1.47 ~  Labs 1/13 showed BUN=26, Creat=1.3, BNP=50... On Lasix 14m74mm. ~  Labs 5/13 showed BUN=28, Creat=1.2 ~  Labs 12/13 on Lasix80 showed BUN=30, Creat=1.5 ~  Labs 3/14 on Lasix80 showed BUN=31, Creat=1.5 ~  Labs 6/14 by DrDeveshwar on Lasix80 showed BUN=30, Cr=1.3 ~  Labs here 11/14 on Lasix80 showed BUN=31, Cr= 1.3 ~  [facility], s/p ERCP, and Creat- 1.1 - 1.7 ~  Labs 9/15 showed Cr= 0.94 to 1.4 range... ~  Labs 10/15 showed Cr= 1.3-1.4  DEGENERATIVE JOINT DISEASE (ICD-715.90) - this is her chief complaint- s/p right TKR in 1999,  left TKR 3/10... on MOBIC 7.5mg 93m & VICODIN Prn as well... followed by DrYates; and had right second toe amp by DrBedArnette Norris for hammertoe + spurs... also [facility] (without benefit she says)... ~  5/13:  She saw DrDeveshwar for Rheum w/ shot in hip & sl improved...  VITAMIN D DEFICIENCY (ICD-268.9) ~  labs 8/09 showed Vit D level = 12... rec- start Vit D 50,000 u weekly... ~  labs 2/10 showed Vit D level = 30... rec- continue Vit D 50K weekly... ~  labs 7/10 showed Vit D level = 39... rec- change to 1000 u OTC daily. ~  labs 8/11 showed Vit D level = 38... continue same. ~  Labs 3/14 showed Vit D level = 43... Continue 1000u daily.  ANXIETY (ICD-300.00) - on ALPRAZOLAM 0.25mgT21mrn... several deaths in the family... daughter who lives w/ her recently ill...  SHINGLES - she had  right T9-10 shingles in 2011 & resolved w/ Valtrex, Pred, etc...   Past Surgical History  Procedure Laterality Date  . Cataract extraction    . Abdominal hysterectomy    . Breast biopsy      Benign  . Total knee arthroplasty  1999    right  . Total knee arthroplasty  11/2008    left - Dr Lorin Mercy  . Toe  amputation  08/2009    Right second toe - Dr Beola Cord  . Ercp N/A 10/06/2013    Procedure: ENDOSCOPIC RETROGRADE CHOLANGIOPANCREATOGRAPHY (ERCP);  Surgeon: Ladene Artist, MD;  Location: [facility];  Service: Endoscopy;  Laterality: N/A;    Outpatient Encounter Prescriptions as of 07/05/2014  Medication Sig  . acetaminophen (TYLENOL) 325 MG tablet Take 325 mg by mouth every 4 (four) hours as needed for mild pain or moderate pain.   . Ascorbic Acid (VITAMIN C) 500 MG tablet Take 500 mg by mouth daily.    [name] aspirin 81 MG tablet Take 81 mg by mouth daily.    . cephALEXin (KEFLEX) 500 MG capsule Take 1 capsule (500 mg total) by mouth 2 (two) times daily.  . Cholecalciferol (VITAMIN D) 1000 UNITS capsule Take 1,000 Units by mouth daily.    . feeding supplement, ENSURE COMPLETE, (ENSURE COMPLETE) LIQD Take 237 mLs by mouth 2 (two) times daily between meals.  . furosemide (LASIX) 40 MG tablet Take 80 mg by mouth daily.  . hydrOXYzine (ATARAX/VISTARIL) 10 MG tablet Take 1-2 tablets by mouth every 6 hours as needed for itching  . levothyroxine (SYNTHROID, LEVOTHROID) 125 MCG tablet Take 62.5 mcg by mouth daily before breakfast.  . loratadine (CLARITIN) 10 MG tablet Take 10 mg by mouth daily.  . Omega-3 Fatty Acids (FISH OIL) 1000 MG CAPS Take 1 capsule by mouth daily.  [name] omeprazole (PRILOSEC) 20 MG capsule Take 20 mg by mouth daily.    Vladimir Faster Glycol-Propyl Glycol (SYSTANE) 0.4-0.3 % SOLN Place 1 drop into both eyes 4 (four) times daily.   . potassium chloride (K-DUR) 10 MEQ tablet Take 2 tablets (20 mEq total) by mouth daily.  . timolol (TIMOPTIC) 0.5 % ophthalmic solution Place 1 drop into both eyes 2 (two) times daily.    . traMADol (ULTRAM) 50 MG tablet Take 1 tablet (50 mg total) by mouth every 6 (six) hours as needed for moderate pain or severe pain.  [name] triamcinolone (KENALOG) 0.025 % cream Apply 1 application topically 2 (two) times daily.     Allergies  Allergen Reactions  . Enablex  [Darifenacin Hydrobromide Er] Other (See Comments)    Caused her throat and mouth to have bumps and feel like it was swelling  . Clarithromycin Swelling    REACTION: causes her mouth to swell  . Codeine Nausea Only  . Morphine Nausea And Vomiting  . Sulfonamide Derivatives Other (See Comments)    REACTION: unsure of reaction    Current Medications, Allergies, Past Medical History, Past Surgical History, Family History, and Social History were reviewed in [employer] record.    Review of Systems        See HPI - all other systems neg except as noted...  The patient complains of dyspnea on exertion, peripheral edema, muscle weakness, and difficulty walking.  The patient denies anorexia, fever, weight loss, weight gain, vision loss, decreased hearing, hoarseness, chest pain, syncope, prolonged cough, headaches, hemoptysis, abdominal pain, melena, hematochezia, severe indigestion/heartburn, hematuria, incontinence, genital sores, suspicious skin lesions, transient blindness, depression,  unusual weight change, abnormal bleeding, enlarged lymph nodes, and angioedema.     Objective:   Physical Exam      WD, WN, 80 y/o WF in NAD... GENERAL:  Alert & oriented; pleasant & cooperative... HEENT:  /AT, EOM- full, EACs-clear, TMs-wnl, NOSE-clear, THROAT-clear & wnl. NECK:  Supple w/ fairROM; no JVD; normal carotid impulses w/o bruits; no thyromegaly or nodules palpated; no lymphadenopathy. CHEST:  Clear, decr BS bilat w/o wheezing, rales, or rhonchi heard... HEART:  Regular Rhythm;  gr 1/6 SEM without rubs or gallops detected... ABDOMEN:  Soft & nontender; normal bowel sounds; no organomegaly or masses detected. EXT:  moderate arthritic changes; s/p bilat TKRs; mild varicose veins/ +venous insuffic/ 2+edema Left leg... s/p right second toe distal amputation... NEURO:  CN's intact;  no focal neuro deficits... DERM:  No lesions noted; no rash etc...  RADIOLOGY DATA:   Reviewed in the EPIC EMR & discussed w/ the patient...  LABORATORY DATA:  Reviewed in the EPIC EMR & discussed w/ the patient...   Assessment & Plan:    Left LEG CELLULITIS>> ?Etiology (NOS), no broken skin or draining areas; treated empirically in [place] on Keflex w/ persistent red/ swollen/ tender left leg; we continued the Keflex for 2wks longer & wanted ID consult; she never got the consult but improved on the Keflex; now off the antibiotic w/ decr swelling, erythema, discomfort; REC to continue meds, elevation, no salt, support hose, etc...    HBP>  Controlled on meds, continue same including the Lasix40- 2Qam...  Cardiac>  Prev DrRoss' note reviewed; see 2DEcho, Event Monitor reports no dangerous arrhythmias, continue meds...  Ven Insuffic/ EDEMA- improved>  evals by Cards & VVS> "there is nothing they can do"; continue elevation, no salt, Lasix80 now, compression...  CHOL>  On Simva20 & FLP 5/13 looked good...  HYPOTHYROID>  Continue Synthroid 176mg/d taking 1/2 tab daily...  GI>  Stable now s/p ERCP for gallstones; followed by DrStark now... Episode of cholecystitis, gallstones 1/15- s/p ERCP & improved w/o surg (DrStark, DrCornett)...  Renal Insuffic>  Careful w/ diuresis, NSAIDs etc; Creat varied 1.1 to 1.7  DJD>  She saw DrDeveshwar for her Rheum complaints==> shot in knee; DrYates/ NErnestina Patcheshave signed off; may need pain clinic....  Other problems as noted...   Patient's Medications  New Prescriptions   SIMVASTATIN (ZOCOR) 20 MG TABLET    Take 1 tablet (20 mg total) by mouth daily.  Previous Medications   ACETAMINOPHEN (TYLENOL) 325 MG TABLET    Take 325 mg by mouth every 4 (four) hours as needed for mild pain or moderate pain.    ASCORBIC ACID (VITAMIN C) 500 MG TABLET    Take 500 mg by mouth daily.     ASPIRIN 81 MG TABLET    Take 81 mg by mouth daily.     CHOLECALCIFEROL (VITAMIN D) 1000 UNITS CAPSULE    Take 1,000 Units by mouth daily.     FEEDING  SUPPLEMENT, ENSURE COMPLETE, (ENSURE COMPLETE) LIQD    Take 237 mLs by mouth 2 (two) times daily between meals.   FUROSEMIDE (LASIX) 40 MG TABLET    Take 80 mg by mouth daily.   HYDROXYZINE (ATARAX/VISTARIL) 10 MG TABLET    Take 1-2 tablets by mouth every 6 hours as needed for itching   LORATADINE (CLARITIN) 10 MG TABLET    Take 10 mg by mouth daily.   OMEGA-3 FATTY ACIDS (FISH OIL) 1000 MG CAPS    Take 1 capsule by mouth  daily.   OMEPRAZOLE (PRILOSEC) 20 MG CAPSULE    Take 20 mg by mouth daily.     POLYETHYL GLYCOL-PROPYL GLYCOL (SYSTANE) 0.4-0.3 % SOLN    Place 1 drop into both eyes 4 (four) times daily.    TIMOLOL (TIMOPTIC) 0.5 % OPHTHALMIC SOLUTION    Place 1 drop into both eyes 2 (two) times daily.     TRAMADOL (ULTRAM) 50 MG TABLET    Take 1 tablet (50 mg total) by mouth every 6 (six) hours as needed for moderate pain or severe pain.   TRIAMCINOLONE (KENALOG) 0.025 % CREAM    Apply 1 application topically 2 (two) times daily.   Modified Medications   Modified Medication Previous Medication   LEVOTHYROXINE (SYNTHROID, LEVOTHROID) 125 MCG TABLET levothyroxine (SYNTHROID, LEVOTHROID) 125 MCG tablet      Take 0.5 tablets (62.5 mcg total) by mouth daily before breakfast.    Take 62.5 mcg by mouth daily before breakfast.   POTASSIUM CHLORIDE (K-DUR) 10 MEQ TABLET potassium chloride (K-DUR) 10 MEQ tablet      Take 2 tablets (20 mEq total) by mouth daily.    Take 2 tablets (20 mEq total) by mouth daily.  Discontinued Medications   CEPHALEXIN (KEFLEX) 500 MG CAPSULE    Take 1 capsule (500 mg total) by mouth 2 (two) times daily.

## 2014-07-05 NOTE — Patient Instructions (Signed)
Today we updated your med list in our EPIC system...    Continue your current medications the same...  We refilled your  Potassium, Synthroid, and Simvastatin per request...  Continue w/ the leg cleansing, support hose, elevation, etc...  Today we checked your CXR & follow up blood work...    We will contact you w/ the results when available...   Call for any questions...  Let's plan a follow up visit in 2mo, sooner if needed for problems.Marland KitchenMarland Kitchen

## 2014-07-06 ENCOUNTER — Ambulatory Visit: Payer: Medicare Other | Admitting: Internal Medicine

## 2014-07-07 DIAGNOSIS — M19041 Primary osteoarthritis, right hand: Secondary | ICD-10-CM | POA: Diagnosis not present

## 2014-07-07 DIAGNOSIS — L0390 Cellulitis, unspecified: Secondary | ICD-10-CM | POA: Diagnosis not present

## 2014-07-07 DIAGNOSIS — M1A00X0 Idiopathic chronic gout, unspecified site, without tophus (tophi): Secondary | ICD-10-CM | POA: Diagnosis not present

## 2014-07-07 DIAGNOSIS — M160 Bilateral primary osteoarthritis of hip: Secondary | ICD-10-CM | POA: Diagnosis not present

## 2014-07-15 ENCOUNTER — Telehealth: Payer: Self-pay | Admitting: Pulmonary Disease

## 2014-07-15 NOTE — Telephone Encounter (Signed)
LMTCB for Kayla 

## 2014-07-19 NOTE — Telephone Encounter (Signed)
LMTCB

## 2014-07-21 NOTE — Telephone Encounter (Signed)
Called and spoke with [name] at Dr. Arlean Hopping office and she stated that the pt was seen in their office on 10/15 and she called and spoke with the lab and they told her they still had the pts blood and that they could add on a test.  She did send over an add on sheet for a uric acid.  This does not look like it has been done.  [name] will speak with the provider that wanted this done and see what they would like to do and she will call me back.

## 2014-07-24 ENCOUNTER — Other Ambulatory Visit: Payer: Self-pay | Admitting: Pulmonary Disease

## 2014-07-27 DIAGNOSIS — M25551 Pain in right hip: Secondary | ICD-10-CM | POA: Diagnosis not present

## 2014-07-27 NOTE — Telephone Encounter (Signed)
Pt calling to check on status refill that was sent in .[name]

## 2014-07-27 NOTE — Telephone Encounter (Signed)
Called pt. She is requesting a refill on her tramadol. Last refilled by hospital doc 06/07/14 #25 tabs pending OV 10/05/13.

## 2014-08-03 DIAGNOSIS — I8311 Varicose veins of right lower extremity with inflammation: Secondary | ICD-10-CM | POA: Diagnosis not present

## 2014-08-03 DIAGNOSIS — D692 Other nonthrombocytopenic purpura: Secondary | ICD-10-CM | POA: Diagnosis not present

## 2014-08-03 DIAGNOSIS — Z85828 Personal history of other malignant neoplasm of skin: Secondary | ICD-10-CM | POA: Diagnosis not present

## 2014-08-03 DIAGNOSIS — I8312 Varicose veins of left lower extremity with inflammation: Secondary | ICD-10-CM | POA: Diagnosis not present

## 2014-08-14 ENCOUNTER — Other Ambulatory Visit: Payer: Self-pay | Admitting: Pulmonary Disease

## 2014-08-25 ENCOUNTER — Other Ambulatory Visit: Payer: Self-pay | Admitting: Pulmonary Disease

## 2014-09-12 ENCOUNTER — Telehealth: Payer: Self-pay | Admitting: Pulmonary Disease

## 2014-09-12 DIAGNOSIS — Z85828 Personal history of other malignant neoplasm of skin: Secondary | ICD-10-CM | POA: Diagnosis not present

## 2014-09-12 DIAGNOSIS — I8311 Varicose veins of right lower extremity with inflammation: Secondary | ICD-10-CM | POA: Diagnosis not present

## 2014-09-12 DIAGNOSIS — I8312 Varicose veins of left lower extremity with inflammation: Secondary | ICD-10-CM | POA: Diagnosis not present

## 2014-09-12 MED ORDER — FIRST-DUKES MOUTHWASH MT SUSP: 5.0000 mL | Freq: Four times a day (QID) | OROMUCOSAL | Status: DC | PRN

## 2014-09-12 NOTE — Telephone Encounter (Signed)
ATC pt X 3. Line busy. WCB.

## 2014-09-12 NOTE — Telephone Encounter (Signed)
Called and spoke to pt. Informed pt of the recs per SN. Pt verbalized understanding and denied any further questions or concerns at this time. MMW called in to preferred pharmacy. Nothing further needed.

## 2014-09-12 NOTE — Telephone Encounter (Signed)
Pt c/o head congestion with blood tinged mucus when blowing nose, sore throat, and nasal dryness. Pt states that her mouth is very dry also.  Using OTC Mucinex for congestion.  Gargling with salt water Not much relief from current regimen.  [facility] Allergies  Allergen Reactions  . Enablex [Darifenacin Hydrobromide Er] Other (See Comments)    Caused her throat and mouth to have bumps and feel like it was swelling  . Clarithromycin Swelling    REACTION: causes her mouth to swell  . Codeine Nausea Only  . Morphine Nausea And Vomiting  . Sulfonamide Derivatives Other (See Comments)    REACTION: unsure of reaction   SN please advise. Thanks.

## 2014-09-12 NOTE — Telephone Encounter (Signed)
Per SN---  mucinex 600 mg  1 po qid Increase fluids MMW 4 oz  1 tsp gargle and swallow four times daily as needed

## 2014-09-21 ENCOUNTER — Other Ambulatory Visit: Payer: Self-pay | Admitting: Pulmonary Disease

## 2014-09-29 ENCOUNTER — Other Ambulatory Visit: Payer: Self-pay | Admitting: Pulmonary Disease

## 2014-10-05 ENCOUNTER — Ambulatory Visit (INDEPENDENT_AMBULATORY_CARE_PROVIDER_SITE_OTHER): Payer: Medicare Other | Admitting: Pulmonary Disease

## 2014-10-05 ENCOUNTER — Encounter: Payer: Self-pay | Admitting: Pulmonary Disease

## 2014-10-05 VITALS — BP 132/64 | HR 80 | Temp 97.4°F | Ht 60.0 in | Wt 133.4 lb

## 2014-10-05 DIAGNOSIS — I359 Nonrheumatic aortic valve disorder, unspecified: Secondary | ICD-10-CM

## 2014-10-05 DIAGNOSIS — I872 Venous insufficiency (chronic) (peripheral): Secondary | ICD-10-CM

## 2014-10-05 DIAGNOSIS — N183 Chronic kidney disease, stage 3 unspecified: Secondary | ICD-10-CM

## 2014-10-05 DIAGNOSIS — M159 Polyosteoarthritis, unspecified: Secondary | ICD-10-CM

## 2014-10-05 DIAGNOSIS — K5730 Diverticulosis of large intestine without perforation or abscess without bleeding: Secondary | ICD-10-CM | POA: Diagnosis not present

## 2014-10-05 DIAGNOSIS — E039 Hypothyroidism, unspecified: Secondary | ICD-10-CM

## 2014-10-05 DIAGNOSIS — K2100 Gastro-esophageal reflux disease with esophagitis, without bleeding: Secondary | ICD-10-CM

## 2014-10-05 DIAGNOSIS — I10 Essential (primary) hypertension: Secondary | ICD-10-CM

## 2014-10-05 DIAGNOSIS — R609 Edema, unspecified: Secondary | ICD-10-CM | POA: Diagnosis not present

## 2014-10-05 DIAGNOSIS — J069 Acute upper respiratory infection, unspecified: Secondary | ICD-10-CM | POA: Diagnosis not present

## 2014-10-05 DIAGNOSIS — E7800 Pure hypercholesterolemia, unspecified: Secondary | ICD-10-CM

## 2014-10-05 DIAGNOSIS — F411 Generalized anxiety disorder: Secondary | ICD-10-CM | POA: Diagnosis not present

## 2014-10-05 DIAGNOSIS — M150 Primary generalized (osteo)arthritis: Secondary | ICD-10-CM

## 2014-10-05 MED ORDER — METHYLPREDNISOLONE ACETATE 80 MG/ML IJ SUSP
80.0000 mg | Freq: Once | INTRAMUSCULAR | Status: AC
  Administered 2014-10-05: 80 mg via INTRAMUSCULAR

## 2014-10-05 MED ORDER — FIRST-DUKES MOUTHWASH MT SUSP: 5.0000 mL | Freq: Four times a day (QID) | OROMUCOSAL | Status: DC | PRN

## 2014-10-05 MED ORDER — PREDNISONE (PAK) 5 MG PO TABS: ORAL_TABLET | ORAL | Status: DC

## 2014-10-05 MED ORDER — LEVOFLOXACIN 500 MG PO TABS: 500.0000 mg | ORAL_TABLET | Freq: Every day | ORAL | Status: DC

## 2014-10-05 NOTE — Patient Instructions (Signed)
Today we updated your med list in our EPIC system...    Continue your current medications the same...  For your upper resp infection >>    We wrote for 5days of Levaquin 500mg - take one tab daily til gone...    We gave you a Depo shot for the inflammation and a Pred dosepak to take as directed on the pack starting 1/14 AM...  We also wrote for the MAGIC MOUTHWASH to keep on hand at home > gargle 1 tsp up to 4 times daily as needed for sore throat...  Call for any questions...  Let's plan a follow up visit in 77mo w/ FASTING blood work at that time.Marland KitchenMarland Kitchen

## 2014-10-09 NOTE — Progress Notes (Signed)
Subjective:    Patient ID: [name], female    DOB: 05-28-1915, 81 y.o.   MRN: [id number]  HPI 81 y/o WF here for a follow up visit... she has mult med problems including:  Hx asthmatic bronchitis;  HBP;  Ven Insuffic & edema;  Hyperchol;  Hypothyroid;  GERD/ Divertics/ Colon polyps;  Renal insuffic;  DJD;  Vit D defic;  Anxiety...  ~  SEE PREV EPIC NOTES FOR OLDER DATA >>   ~  September 21, 2012:  22moROV & [name] saw TP 11/13 w/ c/o VI & incr edema> both legs were red, warm, tender- but she had no f/c/s or drainage; she had had foot surg w/ decr mobility & was on reg dose of Lasix40;  VenDopplers were neg for DVT & she was treated w/ DoxyBid for 10d- no improvement; she went to see Derm- DrJordan/ Jones & they changed her support hose "he found an ulcer" she says & treating w/ [employer] brand "hydrocolloid adhesive patch" Q2d; we also incr the Lasix to 820md for the last month & she reports improved w/ decr redness, no drainage, decr edema "I can now fit into my shoes" she says...     AB> stable, no recent exac, no regular meds required & she uses the Mucinex as needed...    HBP> controlled on Diltiazem180 & Furosemide 4065mQam w/ BP=138/62 today & she denies CP, palpit, dizzy, ch inSOB, etc...    VI, Edema> she knows to elim sodium, elevate, wear support hose; she has been seen by [facility], DrEarly & there is nothing they can do; recently checked by DerPayton MccallumrJones w/ patch dressing & support hose; edema decr w/ Lasix 79m44m Lab today shows Creat=1.5...  Marland KitchenMarland KitchenCHOL> prev followed by DrGegick; on Simva20 Qhs + low fat diet; last FLP 5/13 showed TChol 191, TG 81, HDL 82, LDL 93    Hypothy> stable on Synthroid 125mc20m/2 tab daily; labs 1/13 showed TSH= 1.65     Renal Insuffic> Creat had prev increased to 1.8 on the Lasix 79mg;63meat labs 9/12 by DrDeveshwar showed BUN=33, Creat=1.47, assoc w/ resolved edema therefore decr Lasix to 40mg- 53m Qam; Lab today shows BUN=30, Creat=1.5 on the Lasix80/d at  present...    DJD> followed by DrDevesLadene Artistodin prn, Allopurinol100- now 1.5tabs/d (Uric=5.9), & off Colcrys...     Anxiety> on Alpraz0.25mg pr50m We reviewed prob list, meds, xrays and labs> see below for updates >> she reports that she recently got a 17yr driv38yr license...  LABS 12/13:  Chems= ok w/ BUN=30, Creat=1.5...  ~  MaMarland Kitchench 31, 2014:  20mo ROV &278moginia indicates that she [facility] & the shield around it had an infection);  she has been on Lasix40-2tabsQam for the last 20mo & her 41mos up 3# to 151# today;  BP is controlled w/ this & CardizemCD180, BP= 130/66;  Extremities show 2-3+ pitting in legs but chest is clear etc;  There is not a lot of wiggle room on her diuretics betw edema & renal insuffic- we decided to check labs & continue same Rx for now...    We reviewed prob list, meds, xrays and labs> see below for updates >>  LABS 3/14:  Chems stable w/ BUN=31, Creat=1.5, LFTs=wnl;  CBC- wnl;  TSH=4.28;  VitD=43... rec to continue same meds.  ~  April 21, 2013:  78mo ROV  & 81moinia is actually much improved> weight down  10# w/ resolution of her edema & feeling better overall; c/o incr nasal congestion & drainage- we discussed OTC antihist Rx... We reviewed the following medical problems during today's office visit >>     Ophthalmology> followed at [facility] for Glaucoma & hx corneal transplant w/ infection; she is on some new drops & doing satis she says...    AB> stable, no recent exac, no regular meds required & she uses the Mucinex as needed; she is c/o a runny nose & we discussed Antihist- Claritin, Zyrtek, Allegra as needed...    HBP> on Diltiazem180 & Furosemide 43m-2Qam w/ BP=150/64 today & she denies CP, palpit, dizzy, ch in SOB, etc...    VI, Hx Edema> she knows to elim sodium, elevate, wear support hose, & the Lasix80; weight is down 10# today to 141# w/ resolution of edema; [name] (Derm) treated dermatitis w/ new cream & improved...      CHOL> prev followed by DrGegick; on Simva20 Qhs + low fat diet; last FLP 5/13 showed TChol 191, TG 81, HDL 82, LDL 93    Hypothy> on Synthroid 1274m- 1/2 tab daily; labs 3/14 showed TSH= 4.28    Renal Insuffic> Creat had prev up to 1.8 on the Lasix 8053mLab 1/14 showed BUN=30, Creat=1.5 on the Lasix80/d; repeat labs 6/14 by DrDeveshwar showed Cr=1.3    DJD, Gout> followed by DrDeveshwar on Vicodin prn, ?Allopurinol200- 1.5tabs/d (labs 6/14 Uric=4.5) per Rheum, & off Colcrys...    Anxiety> on Alpraz0.71m73mn... We reviewed prob list, meds, xrays and labs> see below for updates >>   LABS 6/14 by DrDeveshwar:  Chems- wnl x Cr=1.3;  CBC- wnl w/ Hg=12.5;  Uric=4.5...  [name]  August 16, 2013:  40mo 71mo& [name] is c/o back & leg pain, thinks she wants another epidural shot & says she will sched this herself; she is not using the Hydrocodone, just Tylenol & we discussed trial Tramadol in addition... She is also c/o dermatitis in legs- has seen DrJordan for this & will f/u w/ them as well...     HBP> on Diltiazem180 & Furosemide 40mg-73m w/ BP=126/64 today & she denies CP, palpit, dizzy, ch in SOB, etc...    VI, Hx Edema> she knows to elim sodium, elevate, wear support hose, & the Lasix80; weight is back up 8# today to 149# w/ mild edema; [name] (Derm) prev treated dermatitis w/ new cream & it improved...     CHOL> on Simva20 Qhs + low fat diet; last FLP 5/13 showed TChol 191, TG 81, HDL 82, LDL 93... Needs to ret fasting for f/u blood work.    Hypothy> on Synthroid 171mcg-78m tab daily; labs 3/14 showed TSH= 4.28    Renal Insuffic> Creat had prev up to 1.8 on the Lasix 80mg; L31m/14 showed BUN=30, Creat=1.5 on the Lasix80/d; repeat labs 6/14 by DrDeveshwar showed Cr=1.3 & it is 1.3 today... We reviewed prob list, meds, xrays and labs> see below for updates >> ok 2014 Flu vacine today...   LABS 11/14:  Chems- ok w/ BS=108, Cr=1.3;  Uric=4.3 on Allopurinol100;  CBC- ok w/ Hg=12.6...  ~  December 15, 2013:  40mo ROV 22morginia was Hosp 1/13[facility]/19/15 w/ RUQ pain & dx w/ gallstones, cholecystitis, & had ERCP w/ sphincterotomy (DrStark) & stone extraction; treated w/ Cipro, LFTs improved, course complicated by LE cellulitis requiring addition of Vanco IV; she had f/u DrCornett, CCS 2/15> brief note, did not rec elective surg given her age... We reviewed the  following medical problems during today's office visit >>     HBP> on Diltiazem180 & Furosemide 25m-2Qam w/ BP=118/70 today & she denies CP, palpit, dizzy, ch in SOB, etc...    VI, Hx Edema> she knows to elim sodium, elevate, wear support hose, & the Lasix80; weight is back down 9# today to 137#; treated in hosp 1/15 for cellulitis...    CHOL> on Simva20 Qhs + low fat diet; last FLP 5/13 showed TChol 191, TG 81, HDL 82, LDL 93... Needs to ret fasting for f/u blood work.    Hypothy> on Synthroid 1226m- 1/2 tab daily; labs 3/14 showed TSH= 4.28    Renal Insuffic> Creat prev up to 1.8 on the Lasix 8052m1/15 showed Cr betw 1.1-1.7    Derm> she has Psoriasis on her arms & has been evaluated by DrJordan/Jones; they also treated the LE cellulitis; Rec Atarax prn itching...    Anemia> Hosp 1/15 w/ gallstones and cholecystits; Hg= 7.5-9.4 We reviewed prob list, meds, xrays and labs> see below for updates >>   ~  June 14, 2014:  63mo71mo & post hosp check> [name] w/ sudden fever to 103, chills, and left leg pain/ redness/ swelling=> c/w severe cellulitis; she was felt to be septic but no lesions on leg, no areas of broken skin (no topical culture material possible) and blood cultures were NEG;  CBC showed WBC up to 29K before improving to 11.7 by disch, mild anemia w/ Hg= 10-11 range, mild renal insuffic w/ BUN=39 & Cr=1.3, MRSA screen NEG, UA was clear, PCT not checked;  MRI of left leg showed diffuse SQ soft tissue swelling & edema, no discrete abscess identified, no osteomyelitis, no signs of septic arthritis, etc;   She was treated w/ empiric Vancomycin & Cefepime- improved clinically but leg remained red/ tender/ swollen copared to the uninvolved right leg;  VenDopplers were neg for DVT;  She was disch on the 7th day w/ PO KEFLEX 500mg77m x7 more days; she has been elev her leg, wearing support hose, her Lasix dose was cut from 80mgQ37mo 40mg/d80mher meds the same...     Since disch she has just about finished the Keflex but left leg is still markedly swollen vis-a-vie the right leg- and it is red, tender, 2-3+ pitting edema; no areas of broken skin identified; she denies systemic symptoms- no fever now, BP is stable (146/60 today),etc;  I am reluctant to stop her antibiotics at this point w/ persistent signs & symptoms in her left leg; we will recheck LABS today and refill Keflex500Bid, and refer to ID Team for their consultation...    Other medical problems as noted above... NOTE: Prev CT Abd&Pelvis 1/12 showed bilateral low density adnexal lesions, larger on the left- demonstrated on prior studies (On the right, lesion measures 3.1 cm, on the left, it measures 6.5 x 5.5 cm). No surrounding inflammatory changes are evident. Left-sided lesion has enlarged from pelvic ultrasound done 10/01/2004 at which time it measured 4.8 x 4.4 x 4.5 cm. Bilat renal cysts. Large left perianal lipoma.    We reviewed prob list, meds, xrays and labs> see below for updates >>   CXR 9/15 showed borderline cardiomeg, underlying chr lung dis, left base atx vs effusion, ?early RUL opac...   EKG 9/15 showed NSR w/ PACs, rate92, NSSTTWA, NAD...  MRI of left leg 9/15> diffuse SQ soft tissue swelling & edema, no discrete abscess identified, no osteomyelitis, no signs  of septic arthritis, etc (c/w severe cellulitis).  Ven Dopplers 9/15> no evid of DVT or superficial thrombosis in left leg, enlarged LN's noted in left groin  LABS 9/15 in Hosp> Hg=10-11 range, WBC=29K=>11.7 by disch, BUN/Cr= 39/1.3 improved to 27/0.9 by disch, MRSA=neg,  UA=clear...  LABS 9/15 in office f/u>  Chems- ok x Na=131, Cr=1.4;  CBC- ok x   CT Abd&Pelvis 9/15> showed no acute abnormality & no venous obstruction or lymphocele to suggest a cause for left leg swelling; Cholelithiasis; unchanged bilateral adnexal cystic lesions (probably cysts); unchanged renal cysts and left renal atrophy; unchanged left perianal lipoma... PLAN>> we will continue her Keflex500Bid, refer for ID Consult, proceed w/ f/u CT Abd&Pelvis to eval adenopathy & left pelvis...   ~  July 05, 2014:  3wk ROV and [name]'s left leg has improved; she has finished the last round of oral Keflex and her left leg shows decr swelling (she has lost 2#), less red, less tender/ painful (still no sores or drainage), walking better, no f/c/s, etc;  She continues to minimize salt in her diet, elev leg, wear support hose, & has continued her Lasix40-2Qam + K10-2Qam...  She has not yet seen ID consult & she feels this would be a waste of time in light of her clinical improvement...  We decided to follow up on her [place] (CXR has cleared & back to baseline) & Labs (Cr=1.3, CBC is wnl, Sed improved to 54))... Rec to continue current meds/ continue leg cleansing/ elevation/ no salt/ support hose when up/ etc... ROV recheck in 28mo& sooner if needed for problems...    We reviewed prob list, meds, xrays and labs> see below for updates >>   CXR 10/15 showed clearing of patchy RUL opac & left effusion, lungs now clear & back to baseline, NAD...  LABS 101/15:  Chems- ok x Na=132, BUN=33, Cr=1.3;  CBC- ok w/ Hg=12.0, WBC=7.6;  Sed=54 (improved from 90)...  ~  October 05, 2014:  343moOV & [name] reports that her left leg has been improved overall- she notes that her leg got sl red & swollen over Christmas, [facility] [employer] [employer] [facility] & improved she says; still taking ASA81, Lasix40-2Qam & K10-2/d... Her CC today id "feeling bad" c/o "throat";  Notes several week hx sore throat,  followed by cough, yellow sput, but denies f/c/s, CP, SOB; she took several OTC meds & Robitussin- min improvement;  Exam shows Afeb, VSS, throat is neg, chest has few basilar rhonchi but no wheezing/ rales/ consolidation... We reviewed the following medical problems during today's office visit >>     HBP> on Diltiazem180, Furosemide 4027mQam, & K10-2/d;  BP=132/64 today & she denies CP, palpit, dizzy, ch in SOB, etc...    VI, Hx Edema> she knows to elim sodium, elevate, wear support hose, & the Lasix80; weight is about stable at 134#; prev treated for cellulitis...    CHOL> on Simva20 Qhs + low fat diet; last FLP 5/13 showed TChol 191, TG 81, HDL 82, LDL 93... Needs to ret fasting for f/u blood work.    Hypothy> on Synthroid 125m50m1/2 tab daily; labs 3/14 showed TSH= 4.28    Renal Insuffic> Creat prev up to 1.8 on the Lasix 80mg71m15 Hosp showed Cr betw 1.1-1.7; last Cr (10/15) = 1.3    Derm> she has Psoriasis on her arms & has been evaluated by DrJordan/Jones; they also treated the LE cellulitis; Rec Atarax prn itching...Marland KitchenMarland Kitchen  Anemia> Hosp 1/15 w/ gallstones and cholecystits (no surg); Hg= 7.5-9.4 at that time; last CBC 10/15 showed Hg= 12.0 We reviewed prob list, meds, xrays and labs> see below for updates >>  PLAN>> We decided to treat w/ Levaquin x5d, Depo80, Pred dosepak; she may also use Mucinex, fluids, & MMW if needed...           Problem List:  GLAUCOMA (ICD-365.9) - hx mult eye problems w/ prev corneal transplant & glaucoma laser eye surg... on eye drops per Ophthalmology at [facility]...  ASTHMATIC BRONCHITIS, ACUTE (ICD-466.0) - Hx AB requiring Rx w/ Depo/ Pred/ Mucinex/ Tussionex... ~  CXR 4/11 showed vague nodular densities on left... ~  8/11:  she went to an [facility] w/ cough, sputum, & dx w/ pneumonia rx w/ ZPak... ~  CXR 3/12 showed borderline cardiomeg, nodular opac left base, NAD... ~  Spring 2013: she notes some allergy symptoms... ~  CXR 1/15 showed mild cardiomeg,  atherosclerotic & tortuous Ao, low lung vol & sl incr markings, NAD.[name].  ~  CXR 9/15 showed borderline cardiomeg, underlying chr lung dis, left base atx vs effusion, ?early RUL opac... ~  CXR 10/15 showed clearing of patchy RUL opac & left effusion, lungs now clear & back to baseline, NAD  HYPERTENSION (ICD-401.9) - controlled on ASA 66m/d,  CARTIAXT 1825md, LASIX 4060m 1-2Qam... ~  7/10:  labs showed BUN=54, Creat=2.0 & rec to decr diuretics in half= one Demedex & 1/2 Aldactone. ~  11/10:  labs showed BUN=38, Creat=1.8 & rec to keep same meds. ~  8/11:  labs showed BUN=42, Creat=2.1, K=4.3 ~  1/12:  labs in ER showed BUN=56, Creat=2.67, therefore diuretics decr to Demadex20/d only. ~  3/12:  2DEcho showed mod LVH, norm sys function w/ EF=60-65% & no regional wall motion abn; Gr1DD, mibile density on AoV- prob lambl'e escrescence, mildMR. ~  5/12:  f/u labs in office showed BUN=45, Creat=1.8, on Lasix 10m55mm for her edema... ~  9/12:  [facility] showed BUN=33, Creat=1.47, edema resolved therefore decr Lasix to 40mg100m~  1/13:  BP= 160/76 but hasn't taken meds today; labs improved w/ BUN=26, Creat=1.3, BNP=50 ~  5/13:  BP= 140/78 & denies CP, palpit, SOB, edema; labs improved w/ BUN=28, Creat=1.2 ~  9/13:  BP= 140/60 & she denies CP, palpt, ch in SOB or edema... ~  12/13: controlled on Diltiazem180 & Furosemide 40mg-31m w/ BP=138/62 today & she denies CP, palpit, dizzy, ch inSOB, etc... ~  3/14:  BP= 130/66 on same meds; Labs stable as well w/ Cr=1.5, continue same... ~  7/14: on Diltiazem180 & Furosemide 40mg-265mw/ BP=150/64 today & she denies CP, palpit, dizzy, ch in SOB, etc. ~  11/14: BP= 126/64 on same meds, stable... ~  EKG 1/15 showed NSR, rate92, NSSTTWA, NAD... ~  3/15: on Diltiazem180 & Furosemide 40mg-2Q40m/ BP=118/70 today & she denies CP, palpit, dizzy, ch in SOB, etc. ~  9/15: post hosp> off Diltiazem now & still on Furosemide 40mg- 1-2m & K10-2/d w/  BP=146/60 w/ leg swelling, edema, cellulitis as below... ~  1/16: on Diltiazem180, Furosemide 40mg-2Qam24mK10-2/d;  BP=132/64 today & she denies CP, palpit, dizzy, ch in SOB, etc.  PALPITATIONS, HX OF (ICD-V12.50)  VENOUS INSUFFICIENCY (ICD-459.81) - swelling controlled w/ diuretics, low sodium diet, elevation, support hose, etc... ~  She saw DrEarly [facility]- there was nothing he could do... ~  Swelling diminished w/ low sodium, elevation, support hose, & Lasix40 1-2tabs daily... ~  Nov-Dec2013: she knows to elim sodium, elevate, wear support hose; she has been seen by [facility], DrEarly & there is nothing they can do; recently checked by Payton Mccallum- DrJones w/ patch dressing & support hose; edema decr w/ Lasix 15m/d; Lab today shows Creat=1.5..[name] ~  7/14:  she knows to elim sodium, elevate, wear support hose, & the Lasix80; weight is down 10# today to 141# w/ resolution of edema; [name] (Derm) treated dermatitis w/ new cream & improved. ~  11/14: wt is back up 8# to 149# today & she needs to elim sodium, elev legs, etc... ~  3/15: she knows to elim sodium, elevate, wear support hose, & the Lasix80; weight is back down 9# today to 137#..[name] ~  [facility]//15: [facility]/ 4+edema etc; VenDopplers neg for DVT; no open wounds/ drainage; treated w/ IV antibiotics and disch on Keflex; has persistent erythema, pain, swelling & we decided to continue the Keflex & refer to ID... ~  CT Abd&Pelvis 9/15> showed no acute abnormality & no venous obstruction or lymphocele to suggest a cause for left leg swelling; Cholelithiasis; unchanged bilateral adnexal cystic lesions (probably cysts); unchanged renal cysts and left renal atrophy; unchanged left perianal lipoma... ~  10/15: she is improved after the extended course of Keflex w/ decr erythema, swelling & discomfort; she did not see ID; plan is to continue same meds, cleansing, elevation, no salt, support hose...  HYPERCHOLESTEROLEMIA (ICD-272.0) - prev  Rx'd by DrGegick... Now on ZOCOR 268md (prev Crestor5 & Lescol [facility]  [facility] (off med due to $$$) w/ TChol 285, TG 204, HDL 69, LDL 195... "he was mad at me" ~  [facility] 202, TG 60, HDL 112, LDL 99 ~  She is reminded (again) to come FASTING for f/u FLP... ~  FLRussell/13 on Simva20 showed TChol 191, TG 81, HDL 82, LDL 93  HYPOTHYROIDISM (ICD-244.9) - prev followed by DrGegick on SYNTHROID 12530m 1/2 daily... ~  pt [facility] w/ TSH= 2.90, FreeT4= 1.09 ~  labs 8/11 showed TSH= 1.87 ~  Labs 3/12 showed TSH= 2.81 ~  Labs 1/13 showed TSH= 1.65 ~  Labs 3/14 showed TSH= 4.28  GERD (ICD-530.81) - uses PRILOSEC 71m46m.. last EGD was 6/06 by DrPatterson showing 3cmHH, reflux...   DIVERTICULOSIS OF COLON (ICD-562.10) & COLONIC POLYPS (ICD-211.3) - last colonoscopy 6/06 was WNL- no abnormality seen...  GALLSTONE and CHOLECYSTITIS >>  ~  [name] w/ RUQ pain & dx w/ gallstones, cholecystitis, & had ERCP w/ sphincterotomy (DrStark) & stone extraction; treated w/ Cipro, LFTs improved, course complicated by LE cellulitis requiring addition of Vanco IV; she had f/u DrCornett, CCS 2/15> brief note, did not rec elective surg given her age. ~  CT Abd&Pelvis 9/15> showed no acute abnormality & no venous obstruction or lymphocele to suggest a cause for left leg swelling; Cholelithiasis; unchanged bilateral adnexal cystic lesions (probably cysts); unchanged renal cysts and left renal atrophy; unchanged left perianal lipoma  RENAL INSUFFICIENCY (ICD-588.9) - tough balance betw renal perfusion & diuresis for edema... ~  labs 10/08 showed BUN= 27, Creat= 1.5 ~  labs 8/09 showed BUN= 44, Creat= 1.9 ~  labs 2/10 showed BUN 46, Creat= 1.7 ~  labs 7/10 showed BUN= 54, Creat= 2.0 ~  labs 11/10 showed BUN= 38, Creat= 1.8 ~  labs 8/11 showed BUN= 42, Creat= 2.1 ~  labs in  ER 1/12 showed BUN=56, Creat=2.67, rec decr  diuretics to Demadex12m/d only. ~  Labs 3/12 showed improved renal function w/ BUN=31, Creat=1.4... ~  Labs 5/12 on Lasix80 showed BUN=45, Creat=1.8 ~  Labs 9/12 by DrDeveshwar showed BUN=33, Creat=1.47 ~  Labs 1/13 showed BUN=26, Creat=1.3, BNP=50... On Lasix 454mQam. ~  Labs 5/13 showed BUN=28, Creat=1.2 ~  Labs 12/13 on Lasix80 showed BUN=30, Creat=1.5 ~  Labs 3/14 on Lasix80 showed BUN=31, Creat=1.5 ~  Labs 6/14 by DrDeveshwar on Lasix80 showed BUN=30, Cr=1.3 ~  Labs here 11/14 on Lasix80 showed BUN=31, Cr= 1.3 ~  [facility] w/ cholecystit[facility] & stones, s/p ERCP, and Creat- 1.1 - 1.7 ~  Labs 9/15 showed Cr= 0.94 to 1.4 range... ~  Labs 10/15 showed Cr= 1.3-1.4  DEGENERATIVE JOINT DISEASE (ICD-715.90) - this is her chief complaint- s/p right TKR in 1999,  left TKR 3/10... on MOBIC 7.69m7mrn & VICODIN Prn as well... followed by DrYates; and had right second toe amp by DrBArnette Norris10 for hammertoe + spurs... also had epidural [facility] (without benefit she says)... ~  5/13:  She saw DrDeveshwar for Rheum w/ shot in hip & sl improved...  VITAMIN D DEFICIENCY (ICD-268.9) ~  labs 8/09 showed Vit D level = 12... rec- start Vit D 50,000 u weekly... ~  labs 2/10 showed Vit D level = 30... rec- continue Vit D 50K weekly... ~  labs 7/10 showed Vit D level = 39... rec- change to 1000 u OTC daily. ~  labs 8/11 showed Vit D level = 38... continue same. ~  Labs 3/14 showed Vit D level = 43... Continue 1000u daily.  ANXIETY (ICD-300.00) - on ALPRAZOLAM 0.269m52m Prn... several deaths in the family... daughter who lives w/ her recently ill...  SHINGLES - she had right T9-10 shingles in 2011 & resolved w/ Valtrex, Pred, etc...   Past Surgical History  Procedure Laterality Date  . Cataract extraction    . Abdominal hysterectomy    . Breast biopsy      Benign  . Total knee arthroplasty  1999    right  . Total knee arthroplasty  11/2008    left - Dr YateLorin MercyToe  amputation  08/2009    Right second toe - Dr BednBeola CordErcp N/A 10/06/2013    Procedure: ENDOSCOPIC RETROGRADE CHOLANGIOPANCREATOGRAPHY (ERCP);  Surgeon: MalcLadene Artist;  [facility] Cor[facility]: Endoscopy;  Laterality: N/A;    Outpatient Encounter Prescriptions as of 10/05/2014  Medication Sig  . acetaminophen (TYLENOL) 325 MG tablet Take 325 mg by mouth every 4 (four) hours as needed for mild pain or moderate pain.   . ALMarland KitchenRAZolam (XANAX) 0.25 MG tablet TAKE 1/2 TO 1 TABLET THREE TIMES A DAY AS NEEDED  . Ascorbic Acid (VITAMIN C) 500 MG tablet Take 500 mg by mouth daily.    . asMarland Kitchenirin 81 MG tablet Take 81 mg by mouth daily.    . Cholecalciferol (VITAMIN D) 1000 UNITS capsule Take 1,000 Units by mouth daily.    . diMarland Kitchentiazem (CARDIZEM CD) 180 MG 24 hr capsule TAKE ONE CAPSULE BY MOUTH EVERY DAY  . Diphenhyd-Hydrocort-Nystatin (FIRST-DUKES MOUTHWASH) SUSP Use as directed 5 mLs in the mouth or throat 4 (four) times daily as needed.  . feeding supplement, ENSURE COMPLETE, (ENSURE COMPLETE) LIQD Take 237 mLs by mouth 2 (two) times daily between meals.  . furosemide (LASIX) 40 MG tablet TAKE 2 TABLETS BY MOUTH ONCE EVERY  MORNING  . hydrOXYzine (ATARAX/VISTARIL) 10 MG tablet Take 1-2 tablets by mouth every 6 hours as needed for itching  . levothyroxine (SYNTHROID, LEVOTHROID) 125 MCG tablet Take 0.5 tablets (62.5 mcg total) by mouth daily before breakfast.  . Omega-3 Fatty Acids (FISH OIL) 1000 MG CAPS Take 1 capsule by mouth daily.  [name] omeprazole (PRILOSEC) 20 MG capsule Take 20 mg by mouth daily.    Vladimir Faster Glycol-Propyl Glycol (SYSTANE) 0.4-0.3 % SOLN Place 1 drop into both eyes 4 (four) times daily.   . potassium chloride (K-DUR) 10 MEQ tablet Take 2 tablets (20 mEq total) by mouth daily.  . simvastatin (ZOCOR) 20 MG tablet Take 1 tablet (20 mg total) by mouth daily.  . timolol (TIMOPTIC) 0.5 % ophthalmic solution Place 1 drop into both eyes 2 (two) times daily.    . traMADol (ULTRAM) 50  MG tablet TAKE 1 TABLET BY MOUTH 3 TIMES A DAY AS NEEDED FOR PAIN  . triamcinolone (KENALOG) 0.025 % cream Apply 1 application topically 2 (two) times daily.   . [DISCONTINUED] Diphenhyd-Hydrocort-Nystatin (FIRST-DUKES MOUTHWASH) SUSP Use as directed 5 mLs in the mouth or throat 4 (four) times daily as needed.  [name] levofloxacin (LEVAQUIN) 500 MG tablet Take 1 tablet (500 mg total) by mouth daily.  [name] loratadine (CLARITIN) 10 MG tablet Take 10 mg by mouth daily.  . predniSONE (STERAPRED UNI-PAK) 5 MG TABS tablet Take as directed  . [DISCONTINUED] furosemide (LASIX) 40 MG tablet Take 80 mg by mouth daily.  . [EXPIRED] methylPREDNISolone acetate (DEPO-MEDROL) injection 80 mg     Allergies  Allergen Reactions  . Enablex [Darifenacin Hydrobromide Er] Other (See Comments)    Caused her throat and mouth to have bumps and feel like it was swelling  . Clarithromycin Swelling    REACTION: causes her mouth to swell  . Codeine Nausea Only  . Morphine Nausea And Vomiting  . Sulfonamide Derivatives Other (See Comments)    REACTION: unsure of reaction    Current Medications, Allergies, Past Medical History, Past Surgical History, Family History, and Social History were reviewed in [employer] record.    Review of Systems        See HPI - all other systems neg except as noted...  The patient complains of dyspnea on exertion, peripheral edema, muscle weakness, and difficulty walking.  The patient denies anorexia, fever, weight loss, weight gain, vision loss, decreased hearing, hoarseness, chest pain, syncope, prolonged cough, headaches, hemoptysis, abdominal pain, melena, hematochezia, severe indigestion/heartburn, hematuria, incontinence, genital sores, suspicious skin lesions, transient blindness, depression, unusual weight change, abnormal bleeding, enlarged lymph nodes, and angioedema.     Objective:   Physical Exam      WD, WN, 81 y/o WF in NAD... GENERAL:  Alert &  oriented; pleasant & cooperative... HEENT:  [place]/AT, EOM- full, EACs-clear, TMs-wnl, NOSE-clear, THROAT-clear & wnl. NECK:  Supple w/ fairROM; no JVD; normal carotid impulses w/o bruits; no thyromegaly or nodules palpated; no lymphadenopathy. CHEST:  Clear, decr BS bilat w/o wheezing, rales, or rhonchi heard... HEART:  Regular Rhythm;  gr 1/6 SEM without rubs or gallops detected... ABDOMEN:  Soft & nontender; normal bowel sounds; no organomegaly or masses detected. EXT:  moderate arthritic changes; s/p bilat TKRs; mild varicose veins/ +venous insuffic/ 2+edema Left leg... s/p right second toe distal amputation... NEURO:  CN's intact;  no focal neuro deficits... DERM:  No lesions noted; no rash etc...  RADIOLOGY DATA:  Reviewed in the EPIC EMR & discussed w/ the  patient...  LABORATORY DATA:  Reviewed in the EPIC EMR & discussed w/ the patient...   Assessment & Plan:    URI/ Bronchitis> she presented 1/16 w/ sore throat, cough, yellow sput & we decided to treat w/ Levaquin x5d, Depo80, Pred dosepak; she may also use Mucinex, fluids, & MMW if needed...  HBP>  Controlled on meds, continue same including the Lasix40- 2Qam...  Cardiac>  Prev DrRoss' note reviewed; see 2DEcho, Event Monitor reports no dangerous arrhythmias, continue meds...  Ven Insuffic/ EDEMA- improved>  evals by Cards & VVS> "there is nothing they can do"; continue elevation, no salt, Lasix80 now, compression...  CHOL>  On Simva20 & FLP 5/13 looked good...  HYPOTHYROID>  Continue Synthroid 161mg/d taking 1/2 tab daily...  GI>  Stable now s/p ERCP for gallstones; followed by DrStark now... Episode of cholecystitis, gallstones 1/15- s/p ERCP & improved w/o surg (DrStark, DrCornett)...  Renal Insuffic>  Careful w/ diuresis, NSAIDs etc; Creat varied 1.1 to 1.7  DJD>  She saw DrDeveshwar for her Rheum complaints==> shot in knee; DrYates/ NErnestina Patcheshave signed off; may need pain clinic....  Hx Left LEG CELLULITIS>> ?Etiology  (NOS), no broken skin or draining areas; [facility]ch on Keflex w/ persistent red/ swollen/ tender left leg; we continued the Keflex for 2wks longer & wanted ID consult; she never got the consult but improved on the Keflex; now off the antibiotic w/ decr swelling, erythema, discomfort; REC to continue meds, elevation, no salt, support hose, etc...  12/15> she reports left leg red, incr swelling, & drainage- went to Derm, DrJordan & given cream which she reports has helped...  Other problems as noted...   Patient's Medications  New Prescriptions   LEVOFLOXACIN (LEVAQUIN) 500 MG TABLET    Take 1 tablet (500 mg total) by mouth daily.   PREDNISONE (STERAPRED UNI-PAK) 5 MG TABS TABLET    Take as directed  Previous Medications   ACETAMINOPHEN (TYLENOL) 325 MG TABLET    Take 325 mg by mouth every 4 (four) hours as needed for mild pain or moderate pain.    ALPRAZOLAM (XANAX) 0.25 MG TABLET    TAKE 1/2 TO 1 TABLET THREE TIMES A DAY AS NEEDED   ASCORBIC ACID (VITAMIN C) 500 MG TABLET    Take 500 mg by mouth daily.     ASPIRIN 81 MG TABLET    Take 81 mg by mouth daily.     CHOLECALCIFEROL (VITAMIN D) 1000 UNITS CAPSULE    Take 1,000 Units by mouth daily.     DILTIAZEM (CARDIZEM CD) 180 MG 24 HR CAPSULE    TAKE ONE CAPSULE BY MOUTH EVERY DAY   FEEDING SUPPLEMENT, ENSURE COMPLETE, (ENSURE COMPLETE) LIQD    Take 237 mLs by mouth 2 (two) times daily between meals.   FUROSEMIDE (LASIX) 40 MG TABLET    TAKE 2 TABLETS BY MOUTH ONCE EVERY MORNING   HYDROXYZINE (ATARAX/VISTARIL) 10 MG TABLET    Take 1-2 tablets by mouth every 6 hours as needed for itching   LEVOTHYROXINE (SYNTHROID, LEVOTHROID) 125 MCG TABLET    Take 0.5 tablets (62.5 mcg total) by mouth daily before breakfast.   LORATADINE (CLARITIN) 10 MG TABLET    Take 10 mg by mouth daily.   OMEGA-3 FATTY ACIDS (FISH OIL) 1000 MG CAPS    Take 1 capsule by mouth daily.   OMEPRAZOLE (PRILOSEC) 20 MG CAPSULE    Take 20 mg by mouth  daily.     POLYETHYL GLYCOL-PROPYL  GLYCOL (SYSTANE) 0.4-0.3 % SOLN    Place 1 drop into both eyes 4 (four) times daily.    POTASSIUM CHLORIDE (K-DUR) 10 MEQ TABLET    Take 2 tablets (20 mEq total) by mouth daily.   SIMVASTATIN (ZOCOR) 20 MG TABLET    Take 1 tablet (20 mg total) by mouth daily.   TIMOLOL (TIMOPTIC) 0.5 % OPHTHALMIC SOLUTION    Place 1 drop into both eyes 2 (two) times daily.     TRAMADOL (ULTRAM) 50 MG TABLET    TAKE 1 TABLET BY MOUTH 3 TIMES A DAY AS NEEDED FOR PAIN   TRIAMCINOLONE (KENALOG) 0.025 % CREAM    Apply 1 application topically 2 (two) times daily.   Modified Medications   Modified Medication Previous Medication   DIPHENHYD-HYDROCORT-NYSTATIN (FIRST-DUKES MOUTHWASH) SUSP Diphenhyd-Hydrocort-Nystatin (FIRST-DUKES MOUTHWASH) SUSP      Use as directed 5 mLs in the mouth or throat 4 (four) times daily as needed.    Use as directed 5 mLs in the mouth or throat 4 (four) times daily as needed.  Discontinued Medications   FUROSEMIDE (LASIX) 40 MG TABLET    Take 80 mg by mouth daily.

## 2014-11-16 DIAGNOSIS — H4011X2 Primary open-angle glaucoma, moderate stage: Secondary | ICD-10-CM | POA: Diagnosis not present

## 2014-11-16 DIAGNOSIS — H4053X2 Glaucoma secondary to other eye disorders, bilateral, moderate stage: Secondary | ICD-10-CM | POA: Diagnosis not present

## 2014-11-24 ENCOUNTER — Other Ambulatory Visit (HOSPITAL_COMMUNITY): Payer: Self-pay

## 2014-11-24 ENCOUNTER — Emergency Department (HOSPITAL_COMMUNITY): Payer: Medicare Other

## 2014-11-24 ENCOUNTER — Inpatient Hospital Stay (HOSPITAL_COMMUNITY)
Admission: EM | Admit: 2014-11-24 | Discharge: 2014-11-28 | DRG: 872 | Disposition: A | Discharge status: Home / Self Care | Payer: Medicare Other | Attending: Internal Medicine | Admitting: Internal Medicine

## 2014-11-24 ENCOUNTER — Encounter (HOSPITAL_COMMUNITY): Payer: Self-pay | Admitting: Emergency Medicine

## 2014-11-24 DIAGNOSIS — K219 Gastro-esophageal reflux disease without esophagitis: Secondary | ICD-10-CM | POA: Diagnosis present

## 2014-11-24 DIAGNOSIS — A419 Sepsis, unspecified organism: Principal | ICD-10-CM | POA: Diagnosis present

## 2014-11-24 DIAGNOSIS — L03115 Cellulitis of right lower limb: Secondary | ICD-10-CM | POA: Diagnosis present

## 2014-11-24 DIAGNOSIS — D649 Anemia, unspecified: Secondary | ICD-10-CM | POA: Diagnosis not present

## 2014-11-24 DIAGNOSIS — E876 Hypokalemia: Secondary | ICD-10-CM | POA: Diagnosis not present

## 2014-11-24 DIAGNOSIS — Z96653 Presence of artificial knee joint, bilateral: Secondary | ICD-10-CM | POA: Diagnosis present

## 2014-11-24 DIAGNOSIS — N183 Chronic kidney disease, stage 3 unspecified: Secondary | ICD-10-CM | POA: Diagnosis present

## 2014-11-24 DIAGNOSIS — I129 Hypertensive chronic kidney disease with stage 1 through stage 4 chronic kidney disease, or unspecified chronic kidney disease: Secondary | ICD-10-CM | POA: Diagnosis present

## 2014-11-24 DIAGNOSIS — E559 Vitamin D deficiency, unspecified: Secondary | ICD-10-CM | POA: Diagnosis present

## 2014-11-24 DIAGNOSIS — E039 Hypothyroidism, unspecified: Secondary | ICD-10-CM | POA: Diagnosis present

## 2014-11-24 DIAGNOSIS — Z89421 Acquired absence of other right toe(s): Secondary | ICD-10-CM

## 2014-11-24 DIAGNOSIS — H409 Unspecified glaucoma: Secondary | ICD-10-CM | POA: Diagnosis present

## 2014-11-24 DIAGNOSIS — I10 Essential (primary) hypertension: Secondary | ICD-10-CM | POA: Diagnosis not present

## 2014-11-24 DIAGNOSIS — Z79899 Other long term (current) drug therapy: Secondary | ICD-10-CM

## 2014-11-24 DIAGNOSIS — R002 Palpitations: Secondary | ICD-10-CM | POA: Diagnosis not present

## 2014-11-24 DIAGNOSIS — F419 Anxiety disorder, unspecified: Secondary | ICD-10-CM | POA: Diagnosis present

## 2014-11-24 DIAGNOSIS — Z7952 Long term (current) use of systemic steroids: Secondary | ICD-10-CM

## 2014-11-24 DIAGNOSIS — Z7982 Long term (current) use of aspirin: Secondary | ICD-10-CM | POA: Diagnosis not present

## 2014-11-24 DIAGNOSIS — E780 Pure hypercholesterolemia: Secondary | ICD-10-CM | POA: Diagnosis present

## 2014-11-24 DIAGNOSIS — I959 Hypotension, unspecified: Secondary | ICD-10-CM | POA: Diagnosis not present

## 2014-11-24 DIAGNOSIS — R509 Fever, unspecified: Secondary | ICD-10-CM | POA: Diagnosis not present

## 2014-11-24 LAB — CBC WITH DIFFERENTIAL/PLATELET
BASOS PCT: 0 % (ref 0–1)
Basophils Absolute: 0 10*3/uL (ref 0.0–0.1)
EOS ABS: 0 10*3/uL (ref 0.0–0.7)
EOS PCT: 0 % (ref 0–5)
HCT: 40.8 % (ref 36.0–46.0)
HEMOGLOBIN: 13.2 g/dL (ref 12.0–15.0)
Lymphocytes Relative: 5 % — ABNORMAL LOW (ref 12–46)
Lymphs Abs: 1.1 10*3/uL (ref 0.7–4.0)
MCH: 28.9 pg (ref 26.0–34.0)
MCHC: 32.4 g/dL (ref 30.0–36.0)
MCV: 89.3 fL (ref 78.0–100.0)
MONOS PCT: 6 % (ref 3–12)
Monocytes Absolute: 1.2 10*3/uL — ABNORMAL HIGH (ref 0.1–1.0)
NEUTROS PCT: 90 % — AB (ref 43–77)
Neutro Abs: 19.2 10*3/uL — ABNORMAL HIGH (ref 1.7–7.7)
PLATELETS: 83 10*3/uL — AB (ref 150–400)
RBC: 4.57 MIL/uL (ref 3.87–5.11)
RDW: 15.1 % (ref 11.5–15.5)
WBC: 21.5 10*3/uL — ABNORMAL HIGH (ref 4.0–10.5)

## 2014-11-24 LAB — COMPREHENSIVE METABOLIC PANEL
ALT: 14 U/L (ref 0–35)
AST: 20 U/L (ref 0–37)
Albumin: 3.1 g/dL — ABNORMAL LOW (ref 3.5–5.2)
Alkaline Phosphatase: 67 U/L (ref 39–117)
Anion gap: 7 (ref 5–15)
BUN: 21 mg/dL (ref 6–23)
CALCIUM: 8.9 mg/dL (ref 8.4–10.5)
CO2: 34 mmol/L — ABNORMAL HIGH (ref 19–32)
CREATININE: 1.53 mg/dL — AB (ref 0.50–1.10)
Chloride: 95 mmol/L — ABNORMAL LOW (ref 96–112)
GFR calc non Af Amer: 27 mL/min — ABNORMAL LOW (ref >90)
GFR, EST AFRICAN AMERICAN: 32 mL/min — AB (ref >90)
GLUCOSE: 129 mg/dL — AB (ref 70–99)
Potassium: 3.7 mmol/L (ref 3.5–5.1)
SODIUM: 136 mmol/L (ref 135–145)
TOTAL PROTEIN: 6.7 g/dL (ref 6.0–8.3)
Total Bilirubin: 0.5 mg/dL (ref 0.3–1.2)

## 2014-11-24 LAB — URINALYSIS, ROUTINE W REFLEX MICROSCOPIC
BILIRUBIN URINE: NEGATIVE
GLUCOSE, UA: NEGATIVE mg/dL
KETONES UR: NEGATIVE mg/dL
Leukocytes, UA: NEGATIVE
Nitrite: NEGATIVE
Protein, ur: 30 mg/dL — AB
Specific Gravity, Urine: 1.01 (ref 1.005–1.030)
Urobilinogen, UA: 0.2 mg/dL (ref 0.0–1.0)
pH: 5.5 (ref 5.0–8.0)

## 2014-11-24 LAB — URINE MICROSCOPIC-ADD ON

## 2014-11-24 LAB — I-STAT CG4 LACTIC ACID, ED: Lactic Acid, Venous: 1.47 mmol/L (ref 0.5–2.0)

## 2014-11-24 MED ORDER — VANCOMYCIN HCL IN DEXTROSE 750-5 MG/150ML-% IV SOLN: 750.0000 mg | INTRAVENOUS | Status: DC

## 2014-11-24 MED ORDER — OXYCODONE HCL 5 MG PO TABS: 5.0000 mg | ORAL_TABLET | ORAL | Status: DC | PRN

## 2014-11-24 MED ORDER — SIMVASTATIN 20 MG PO TABS
20.0000 mg | ORAL_TABLET | Freq: Every day | ORAL | Status: DC
  Administered 2014-11-25 – 2014-11-28 (×4): 20 mg via ORAL
  Filled 2014-11-24 (×4): qty 1

## 2014-11-24 MED ORDER — TRIAMCINOLONE ACETONIDE 0.025 % EX CREA
1.0000 "application " | TOPICAL_CREAM | Freq: Two times a day (BID) | CUTANEOUS | Status: DC
  Administered 2014-11-25 – 2014-11-28 (×8): 1 via TOPICAL
  Filled 2014-11-24: qty 15

## 2014-11-24 MED ORDER — PIPERACILLIN-TAZOBACTAM 3.375 G IVPB 30 MIN
3.3750 g | Freq: Once | INTRAVENOUS | Status: AC
  Administered 2014-11-24: 3.375 g via INTRAVENOUS
  Filled 2014-11-24: qty 50

## 2014-11-24 MED ORDER — POLYVINYL ALCOHOL 1.4 % OP SOLN
1.0000 [drp] | Freq: Three times a day (TID) | OPHTHALMIC | Status: DC
  Administered 2014-11-25 – 2014-11-28 (×15): 1 [drp] via OPHTHALMIC
  Filled 2014-11-24 (×2): qty 15

## 2014-11-24 MED ORDER — ALPRAZOLAM 0.25 MG PO TABS
0.2500 mg | ORAL_TABLET | Freq: Three times a day (TID) | ORAL | Status: DC | PRN
  Administered 2014-11-25 – 2014-11-27 (×3): 0.25 mg via ORAL
  Filled 2014-11-24 (×3): qty 1

## 2014-11-24 MED ORDER — PANTOPRAZOLE SODIUM 40 MG PO TBEC
40.0000 mg | DELAYED_RELEASE_TABLET | Freq: Every day | ORAL | Status: DC
  Administered 2014-11-25 – 2014-11-28 (×4): 40 mg via ORAL
  Filled 2014-11-24 (×5): qty 1

## 2014-11-24 MED ORDER — VANCOMYCIN HCL IN DEXTROSE 750-5 MG/150ML-% IV SOLN
750.0000 mg | Freq: Once | INTRAVENOUS | Status: AC
  Administered 2014-11-24: 750 mg via INTRAVENOUS
  Filled 2014-11-24: qty 150

## 2014-11-24 MED ORDER — SODIUM CHLORIDE 0.9 % IV BOLUS (SEPSIS)
1000.0000 mL | Freq: Once | INTRAVENOUS | Status: AC
  Administered 2014-11-24: 1000 mL via INTRAVENOUS

## 2014-11-24 MED ORDER — ONDANSETRON HCL 4 MG/2ML IJ SOLN: 4.0000 mg | Freq: Four times a day (QID) | INTRAMUSCULAR | Status: DC | PRN

## 2014-11-24 MED ORDER — ACETAMINOPHEN 325 MG PO TABS
325.0000 mg | ORAL_TABLET | ORAL | Status: DC | PRN
  Administered 2014-11-25: 325 mg via ORAL
  Filled 2014-11-24: qty 1

## 2014-11-24 MED ORDER — ACETAMINOPHEN 325 MG PO TABS
650.0000 mg | ORAL_TABLET | Freq: Four times a day (QID) | ORAL | Status: DC | PRN
  Administered 2014-11-24: 650 mg via ORAL
  Filled 2014-11-24: qty 2

## 2014-11-24 MED ORDER — VANCOMYCIN HCL IN DEXTROSE 750-5 MG/150ML-% IV SOLN
750.0000 mg | INTRAVENOUS | Status: DC
  Administered 2014-11-26: 750 mg via INTRAVENOUS
  Filled 2014-11-24: qty 150

## 2014-11-24 MED ORDER — FOLIC ACID 1 MG PO TABS
1.0000 mg | ORAL_TABLET | Freq: Every day | ORAL | Status: DC
  Administered 2014-11-25 – 2014-11-28 (×4): 1 mg via ORAL
  Filled 2014-11-24 (×4): qty 1

## 2014-11-24 MED ORDER — VITAMIN B-1 100 MG PO TABS
100.0000 mg | ORAL_TABLET | Freq: Every day | ORAL | Status: DC
  Administered 2014-11-25 – 2014-11-28 (×4): 100 mg via ORAL
  Filled 2014-11-24 (×4): qty 1

## 2014-11-24 MED ORDER — ONDANSETRON HCL 4 MG PO TABS: 4.0000 mg | ORAL_TABLET | Freq: Four times a day (QID) | ORAL | Status: DC | PRN

## 2014-11-24 MED ORDER — VITAMIN D3 25 MCG (1000 UNIT) PO TABS
1000.0000 [IU] | ORAL_TABLET | Freq: Every day | ORAL | Status: DC
  Administered 2014-11-25 – 2014-11-28 (×4): 1000 [IU] via ORAL
  Filled 2014-11-24 (×4): qty 1

## 2014-11-24 MED ORDER — SODIUM CHLORIDE 0.9 % IV BOLUS (SEPSIS): 1000.0000 mL | Freq: Once | INTRAVENOUS | Status: DC

## 2014-11-24 MED ORDER — SODIUM CHLORIDE 0.9 % IV BOLUS (SEPSIS)
500.0000 mL | Freq: Once | INTRAVENOUS | Status: AC
  Administered 2014-11-24: 500 mL via INTRAVENOUS

## 2014-11-24 MED ORDER — PIPERACILLIN-TAZOBACTAM IN DEX 2-0.25 GM/50ML IV SOLN
2.2500 g | Freq: Three times a day (TID) | INTRAVENOUS | Status: DC
  Administered 2014-11-25 – 2014-11-27 (×8): 2.25 g via INTRAVENOUS
  Filled 2014-11-24 (×10): qty 50

## 2014-11-24 MED ORDER — HEPARIN SODIUM (PORCINE) 5000 UNIT/ML IJ SOLN
5000.0000 [IU] | Freq: Three times a day (TID) | INTRAMUSCULAR | Status: DC
  Administered 2014-11-25 – 2014-11-28 (×10): 5000 [IU] via SUBCUTANEOUS
  Filled 2014-11-24 (×13): qty 1

## 2014-11-24 MED ORDER — LEVOTHYROXINE SODIUM 125 MCG PO TABS
62.5000 ug | ORAL_TABLET | Freq: Every day | ORAL | Status: DC
  Administered 2014-11-25 – 2014-11-28 (×4): 62.5 ug via ORAL
  Filled 2014-11-24 (×5): qty 0.5

## 2014-11-24 MED ORDER — TRAMADOL HCL 50 MG PO TABS
50.0000 mg | ORAL_TABLET | Freq: Three times a day (TID) | ORAL | Status: DC | PRN
  Administered 2014-11-26 – 2014-11-27 (×2): 50 mg via ORAL
  Filled 2014-11-24 (×2): qty 1

## 2014-11-24 MED ORDER — TIMOLOL MALEATE 0.5 % OP SOLN
1.0000 [drp] | Freq: Two times a day (BID) | OPHTHALMIC | Status: DC
  Administered 2014-11-25 – 2014-11-28 (×8): 1 [drp] via OPHTHALMIC
  Filled 2014-11-24: qty 5

## 2014-11-24 MED ORDER — CETYLPYRIDINIUM CHLORIDE 0.05 % MT LIQD
7.0000 mL | Freq: Two times a day (BID) | OROMUCOSAL | Status: DC
  Administered 2014-11-25 – 2014-11-28 (×7): 7 mL via OROMUCOSAL

## 2014-11-24 MED ORDER — SODIUM CHLORIDE 0.9 % IJ SOLN
3.0000 mL | Freq: Two times a day (BID) | INTRAMUSCULAR | Status: DC
  Administered 2014-11-25 – 2014-11-28 (×6): 3 mL via INTRAVENOUS

## 2014-11-24 MED ORDER — SODIUM CHLORIDE 0.9 % IV SOLN
INTRAVENOUS | Status: DC
  Administered 2014-11-25 – 2014-11-27 (×4): via INTRAVENOUS

## 2014-11-24 MED ORDER — LORATADINE 10 MG PO TABS
10.0000 mg | ORAL_TABLET | Freq: Every day | ORAL | Status: DC
  Administered 2014-11-25 – 2014-11-28 (×4): 10 mg via ORAL
  Filled 2014-11-24 (×4): qty 1

## 2014-11-24 MED ORDER — ENSURE COMPLETE PO LIQD
237.0000 mL | Freq: Two times a day (BID) | ORAL | Status: DC
  Administered 2014-11-26 – 2014-11-28 (×3): 237 mL via ORAL

## 2014-11-24 MED ORDER — ADULT MULTIVITAMIN W/MINERALS CH
1.0000 | ORAL_TABLET | Freq: Every day | ORAL | Status: DC
  Administered 2014-11-25 – 2014-11-28 (×4): 1 via ORAL
  Filled 2014-11-24 (×4): qty 1

## 2014-11-24 MED ORDER — ASPIRIN EC 81 MG PO TBEC
81.0000 mg | DELAYED_RELEASE_TABLET | Freq: Every day | ORAL | Status: DC
  Administered 2014-11-25 – 2014-11-28 (×4): 81 mg via ORAL
  Filled 2014-11-24 (×4): qty 1

## 2014-11-24 MED ORDER — VITAMIN C 500 MG PO TABS
500.0000 mg | ORAL_TABLET | Freq: Every day | ORAL | Status: DC
  Administered 2014-11-25 – 2014-11-28 (×4): 500 mg via ORAL
  Filled 2014-11-24 (×4): qty 1

## 2014-11-24 NOTE — Progress Notes (Signed)
ANTIBIOTIC CONSULT NOTE - INITIAL  Pharmacy Consult for Zosyn (already on vancomycin) Indication: Cellulitis  Allergies  Allergen Reactions  . Enablex [Darifenacin Hydrobromide Er] Other (See Comments)    Caused her throat and mouth to have bumps and feel like it was swelling  . Clarithromycin Swelling    REACTION: causes her mouth to swell  . Codeine Nausea Only  . Morphine Nausea And Vomiting  . Sulfonamide Derivatives Other (See Comments)    REACTION: unsure of reaction    Patient Measurements: Height: 5\' 4"  (162.6 cm) Weight: 132 lb 15 oz (60.3 kg) IBW/kg (Calculated) : 54.7 Vital Signs: Temp: 98.8 F (37.1 C) (03/03 2326) Temp Source: Oral (03/03 2326) BP: 127/86 mmHg (03/03 2326) Pulse Rate: 78 (03/03 2326)  Labs:  Recent Labs  11/24/14 1730  WBC 21.5*  HGB 13.2  PLT 83*  CREATININE 1.53*   Estimated Creatinine Clearance: 18.1 mL/min (by C-G formula based on Cr of 1.53).  Medical History: Past Medical History  Diagnosis Date  . Hypertension   . Hypothyroidism   . Glaucoma   . Acute bronchitis   . Palpitations   . Unspecified venous (peripheral) insufficiency   . Pure hypercholesterolemia   . GERD (gastroesophageal reflux disease)   . Diverticulosis of colon (without mention of hemorrhage)   . Benign neoplasm of colon   . Renal insufficiency   . DJD (degenerative joint disease)   . Vitamin D deficiency disease   . Anxiety state, unspecified   . CKD (chronic kidney disease), stage III 10/06/2013    Assessment: Adding Zosyn for increasing cellulitis coverage  Plan:  -Zosyn 2.25g IV q8h  Narda Bonds 11/24/2014,11:44 PM

## 2014-11-24 NOTE — ED Notes (Signed)
Level 2 Sepsis activated @ 1906

## 2014-11-24 NOTE — ED Notes (Signed)
Pt off unit with xray 

## 2014-11-24 NOTE — H&P (Signed)
[facility] History and Physical  [name] YBO:[id number] DOB: 12/17/1914 DOA: 11/24/2014  Referring physician: Davonna Belling, MD PCP: Noralee Space, MD   Chief Complaint: Fever myalgias  HPI: [name] is a 81 y.o. female presents with fever and malaise. Patient states that she has not been feeling very well over the last few days. She denies having cough or congestion. She states no chest pain. She has no abdominal complaints. Patient has noted an area of increased redness in the right leg. Patient has had some pain noted also. Patient states taht she has no nausea no vomiting. She has had generalized myalgias. Apparently she had been admitted several months ago for similar cellulitis of the leg but on the left side.   Review of Systems:  Constitutional:  No weight loss, night sweats, ++Fevers, ++chills, ++fatigue.  HEENT:  No headaches, No sneezing, itching, ear ache, nasal congestion, post nasal drip,  Cardio-vascular:  No chest pain, Orthopnea, PND, +swelling in right lower extremities with redness  GI:  No heartburn, indigestion, abdominal pain, nausea, vomiting, diarrhea  Resp:  No shortness of breath with exertion or at rest. No excess mucus, no productive cough, No non-productive cough, No coughing up of blood.No change in color of mucus  Skin:  ++erythema of the right lower leg GU:  no dysuria, change in color of urine, no urgency or frequency  Musculoskeletal:  No joint pain or swelling. No decreased range of motion  Psych:  No change in mood or affect. No depression or anxiety   Past Medical History  Diagnosis Date  . Hypertension   . Hypothyroidism   . Glaucoma   . Acute bronchitis   . Palpitations   . Unspecified venous (peripheral) insufficiency   . Pure hypercholesterolemia   . GERD (gastroesophageal reflux disease)   . Diverticulosis of colon (without mention of hemorrhage)   . Benign neoplasm of colon   . Renal insufficiency     . DJD (degenerative joint disease)   . Vitamin D deficiency disease   . Anxiety state, unspecified   . CKD (chronic kidney disease), stage III 10/06/2013   Past Surgical History  Procedure Laterality Date  . Cataract extraction    . Abdominal hysterectomy    . Breast biopsy      Benign  . Total knee arthroplasty  1999    right  . Total knee arthroplasty  11/2008    left - Dr Lorin Mercy  . Toe amputation  08/2009    Right second toe - Dr Beola Cord  . Ercp N/A 10/06/2013    Procedure: ENDOSCOPIC RETROGRADE CHOLANGIOPANCREATOGRAPHY (ERCP);  Surgeon: Ladene Artist, MD;  Location: [place];  Service: Endoscopy;  Laterality: N/A;   Social History:  reports that she has never smoked. She has never used smokeless tobacco. She reports that she does not drink alcohol or use illicit drugs.  Allergies  Allergen Reactions  . Enablex [Darifenacin Hydrobromide Er] Other (See Comments)    Caused her throat and mouth to have bumps and feel like it was swelling  . Clarithromycin Swelling    REACTION: causes her mouth to swell  . Codeine Nausea Only  . Morphine Nausea And Vomiting  . Sulfonamide Derivatives Other (See Comments)    REACTION: unsure of reaction    Family History  Problem Relation Age of Onset  . Heart disease Mother   . Cancer Father     Throat  . Heart disease Father      Prior  to Admission medications   Medication Sig Start Date End Date Taking? Authorizing Provider  acetaminophen (TYLENOL) 325 MG tablet Take 325 mg by mouth every 4 (four) hours as needed for mild pain or moderate pain.     Historical Provider, MD  ALPRAZolam Duanne Moron) 0.25 MG tablet TAKE 1/2 TO 1 TABLET THREE TIMES A DAY AS NEEDED 08/26/14   Noralee Space, MD  Ascorbic Acid (VITAMIN C) 500 MG tablet Take 500 mg by mouth daily.      Historical Provider, MD  aspirin 81 MG tablet Take 81 mg by mouth daily.      Historical Provider, MD  Cholecalciferol (VITAMIN D) 1000 UNITS capsule Take 1,000 Units by mouth daily.       Historical Provider, MD  diltiazem (CARDIZEM CD) 180 MG 24 hr capsule TAKE ONE CAPSULE BY MOUTH EVERY DAY 09/29/14   Noralee Space, MD  Diphenhyd-Hydrocort-Nystatin (FIRST-DUKES MOUTHWASH) SUSP Use as directed 5 mLs in the mouth or throat 4 (four) times daily as needed. 10/05/14   Noralee Space, MD  feeding supplement, ENSURE COMPLETE, (ENSURE COMPLETE) LIQD Take 237 mLs by mouth 2 (two) times daily between meals. 06/07/14   Shanker Kristeen Mans, MD  furosemide (LASIX) 40 MG tablet TAKE 2 TABLETS BY MOUTH ONCE EVERY MORNING 09/21/14   Noralee Space, MD  hydrOXYzine (ATARAX/VISTARIL) 10 MG tablet Take 1-2 tablets by mouth every 6 hours as needed for itching 12/15/13   Noralee Space, MD  levofloxacin (LEVAQUIN) 500 MG tablet Take 1 tablet (500 mg total) by mouth daily. 10/05/14   Noralee Space, MD  levothyroxine (SYNTHROID, LEVOTHROID) 125 MCG tablet Take 0.5 tablets (62.5 mcg total) by mouth daily before breakfast. 07/05/14   Noralee Space, MD  loratadine (CLARITIN) 10 MG tablet Take 10 mg by mouth daily.    Historical Provider, MD  Omega-3 Fatty Acids (FISH OIL) 1000 MG CAPS Take 1 capsule by mouth daily.    Historical Provider, MD  omeprazole (PRILOSEC) 20 MG capsule Take 20 mg by mouth daily.      Historical Provider, MD  Polyethyl Glycol-Propyl Glycol (SYSTANE) 0.4-0.3 % SOLN Place 1 drop into both eyes 4 (four) times daily.     Historical Provider, MD  potassium chloride (K-DUR) 10 MEQ tablet Take 2 tablets (20 mEq total) by mouth daily. 07/05/14   Noralee Space, MD  predniSONE (STERAPRED UNI-PAK) 5 MG TABS tablet Take as directed 10/05/14   Noralee Space, MD  simvastatin (ZOCOR) 20 MG tablet Take 1 tablet (20 mg total) by mouth daily. 07/05/14   Noralee Space, MD  timolol (TIMOPTIC) 0.5 % ophthalmic solution Place 1 drop into both eyes 2 (two) times daily.      Historical Provider, MD  traMADol (ULTRAM) 50 MG tablet TAKE 1 TABLET BY MOUTH 3 TIMES A DAY AS NEEDED FOR PAIN 07/27/14   Noralee Space, MD   triamcinolone (KENALOG) 0.025 % cream Apply 1 application topically 2 (two) times daily.  09/11/12   Historical Provider, MD   Physical Exam: Filed Vitals:   11/24/14 1930 11/24/14 1945 11/24/14 2000 11/24/14 2015  BP: 103/43 96/39 98/30  102/43  Pulse: 87 93 84 88  Temp:      TempSrc:      Resp: 18 20    Height:   5\' 3"  (1.6 m)   Weight:   60.328 kg (133 lb)   SpO2: 97% 99% 97% 93%    Wt Readings from Last 3 Encounters:  11/24/14 60.328 kg (133 lb)  10/05/14 60.499 kg (133 lb 6 oz)  07/05/14 59.966 kg (132 lb 3.2 oz)    General:  Appears calm and comfortable Eyes: PERRL, normal lids, irises & conjunctiva ENT: grossly normal hearing, lips & tongue Neck: no LAD, masses or thyromegaly Cardiovascular: RRR, no m/r/g  Respiratory: CTA bilaterally, no w/r/r. Normal respiratory effort. Abdomen: soft, ntnd Skin: Right lower extremity erythema and increased warmness noted Musculoskeletal: grossly normal tone BUE/BLE Psychiatric: grossly normal mood and affect Neurologic: grossly non-focal          Labs on Admission:  Basic Metabolic Panel:  Recent Labs Lab 11/24/14 1730  NA 136  K 3.7  CL 95*  CO2 34*  GLUCOSE 129*  BUN 21  CREATININE 1.53*  CALCIUM 8.9   Liver Function Tests:  Recent Labs Lab 11/24/14 1730  AST 20  ALT 14  ALKPHOS 67  BILITOT 0.5  PROT 6.7  ALBUMIN 3.1*   No results for input(s): LIPASE, AMYLASE in the last 168 hours. No results for input(s): AMMONIA in the last 168 hours. CBC:  Recent Labs Lab 11/24/14 1730  WBC 21.5*  NEUTROABS 19.2*  HGB 13.2  HCT 40.8  MCV 89.3  PLT 83*   Cardiac Enzymes: No results for input(s): CKTOTAL, CKMB, CKMBINDEX, TROPONINI in the last 168 hours.  BNP (last 3 results) No results for input(s): BNP in the last 8760 hours.  ProBNP (last 3 results) No results for input(s): PROBNP in the last 8760 hours.  CBG: No results for input(s): GLUCAP in the last 168 hours.  Radiological Exams on  Admission: Dg Chest 2 View  11/24/2014   CLINICAL DATA:  Fever for 3 days  EXAM: CHEST  2 VIEW  COMPARISON:  07/05/2014  FINDINGS: Cardiac shadow is within normal limits. The lungs are well aerated bilaterally. Very minimal left basilar atelectasis is seen. No acute bony abnormality is noted.  IMPRESSION: Minimal left basilar atelectasis.   Electronically Signed   By: Inez Catalina M.D.   On: 11/24/2014 18:56     Assessment/Plan Principal Problem:   Cellulitis of right lower extremity Active Problems:   Hypothyroidism   Essential hypertension   CKD (chronic kidney disease), stage III   Hypotension   1. Cellulitis of Right Leg -will start on vancomycin with pharmacy consult -will follow blood cultures  2. Hypotension -hydrate fluid bolus given in the ED -will hold all BP medications for now -monitor pressures overnight  3. Hypothyroid -will continue with synthroid -will check TSH  4. Essential Hypertension -holding medications for now as pressures are soft  5. CKD III -will monitor labs -hydrate as tolerated -will repeat labs in am  6. Elevated Glucose -will monitor FSBS -check A1C   Code Status: Full Code (must indicate code status--if unknown or must be presumed, indicate so) DVT Prophylaxis:Heparin Family Communication: Daughter (indicate person spoken with, if applicable, with phone number if by telephone) Disposition Plan: Home (indicate anticipated LOS)  Time spent: 65min  KHAN,SAADAT A [facility] Pager [phone]

## 2014-11-24 NOTE — Consult Note (Signed)
ANTIBIOTIC CONSULT NOTE - INITIAL  Pharmacy Consult for Vancomycin Indication: cellulitis  Allergies  Allergen Reactions  . Enablex [Darifenacin Hydrobromide Er] Other (See Comments)    Caused her throat and mouth to have bumps and feel like it was swelling  . Clarithromycin Swelling    REACTION: causes her mouth to swell  . Codeine Nausea Only  . Morphine Nausea And Vomiting  . Sulfonamide Derivatives Other (See Comments)    REACTION: unsure of reaction    Patient Measurements: Weight: 133 lb (60.328 kg)   Vital Signs: Temp: 101.8 F (38.8 C) (03/03 1718) Temp Source: Oral (03/03 1718) BP: 89/60 mmHg (03/03 1815) Pulse Rate: 103 (03/03 1815) Intake/Output from previous day:   Intake/Output from this shift:    Labs:  Recent Labs  11/24/14 1730  WBC 21.5*  HGB 13.2  PLT 83*  CREATININE 1.53*   Estimated Creatinine Clearance: 17.1 mL/min (by C-G formula based on Cr of 1.53).  Microbiology: No results found for this or any previous visit (from the past 720 hour(s)).  Medical History: Past Medical History  Diagnosis Date  . Hypertension   . Hypothyroidism   . Glaucoma   . Acute bronchitis   . Palpitations   . Unspecified venous (peripheral) insufficiency   . Pure hypercholesterolemia   . GERD (gastroesophageal reflux disease)   . Diverticulosis of colon (without mention of hemorrhage)   . Benign neoplasm of colon   . Renal insufficiency   . DJD (degenerative joint disease)   . Vitamin D deficiency disease   . Anxiety state, unspecified   . CKD (chronic kidney disease), stage III 10/06/2013   Assessment: 89yof presents to the ED with fever, body aches, and nausea. She will begin vancomycin for LLE cellulitis. She has a hx of CKD, but sCr appears to be elevated from baseline at 1.53 with CrCl ~ 86ml/min.  Goal of Therapy:  Vancomycin trough level 10-15 mcg/ml  Plan:  1) Vancomycin 750mg  IV A70 2) Follow renal function, LOT, level if needed  Deboraha Sprang 11/24/2014,6:37 PM

## 2014-11-24 NOTE — ED Provider Notes (Signed)
CSN: [id number]     Arrival date & time 11/24/14  1651 History   First MD Initiated Contact with Patient 11/24/14 1736     Chief Complaint  Patient presents with  . Fever  . flu like symptoms      (Consider location/radiation/quality/duration/timing/severity/associated sxs/prior Treatment) Patient is a 81 y.o. female presenting with fever. The history is provided by the patient and a relative.  Fever Associated symptoms: myalgias and nausea   Associated symptoms: no confusion    patient presents with general myalgias and fever for the last couple days. States she's felt bad all over. No headache. No confusion. No cough. Some nausea without vomiting. No diarrhea. No dysuria. Patient denies rash. No chest pain. No headache. Was admitted several months ago for the cellulitis of her left lower leg. She states it is cleared up. She states her right lower leg  Past Medical History  Diagnosis Date  . Hypertension   . Hypothyroidism   . Glaucoma   . Acute bronchitis   . Palpitations   . Unspecified venous (peripheral) insufficiency   . Pure hypercholesterolemia   . GERD (gastroesophageal reflux disease)   . Diverticulosis of colon (without mention of hemorrhage)   . Benign neoplasm of colon   . Renal insufficiency   . DJD (degenerative joint disease)   . Vitamin D deficiency disease   . Anxiety state, unspecified   . CKD (chronic kidney disease), stage III 10/06/2013   Past Surgical History  Procedure Laterality Date  . Cataract extraction    . Abdominal hysterectomy    . Breast biopsy      Benign  . Total knee arthroplasty  1999    right  . Total knee arthroplasty  11/2008    left - Dr Lorin Mercy  . Toe amputation  08/2009    Right second toe - Dr Beola Cord  . Ercp N/A 10/06/2013    Procedure: ENDOSCOPIC RETROGRADE CHOLANGIOPANCREATOGRAPHY (ERCP);  Surgeon: Ladene Artist, MD;  Location: [facility];  Service: Endoscopy;  Laterality: N/A;   Family History  Problem Relation Age of Onset  .  Heart disease Mother   . Cancer Father     Throat  . Heart disease Father    History  Substance Use Topics  . Smoking status: Never Smoker   . Smokeless tobacco: Never Used  . Alcohol Use: No   OB History    No data available     Review of Systems  Constitutional: Positive for fever and fatigue.  Respiratory: Negative for shortness of breath.   Gastrointestinal: Positive for nausea. Negative for constipation.  Genitourinary: Negative for flank pain.  Musculoskeletal: Positive for myalgias.  Neurological: Negative for seizures.  Psychiatric/Behavioral: Negative for confusion.      Allergies  Enablex; Clarithromycin; Codeine; Morphine; and Sulfonamide derivatives  Home Medications   Prior to Admission medications   Medication Sig Start Date End Date Taking? Authorizing Provider  acetaminophen (TYLENOL) 325 MG tablet Take 325 mg by mouth every 4 (four) hours as needed for mild pain or moderate pain.    Yes Historical Provider, MD  ALPRAZolam Duanne Moron) 0.25 MG tablet TAKE 1/2 TO 1 TABLET THREE TIMES A DAY AS NEEDED 08/26/14  Yes Noralee Space, MD  Ascorbic Acid (VITAMIN C) 500 MG tablet Take 500 mg by mouth daily.     Yes Historical Provider, MD  aspirin 81 MG tablet Take 81 mg by mouth daily.     Yes Historical Provider, MD  Cholecalciferol (VITAMIN D)  1000 UNITS capsule Take 1,000 Units by mouth daily.     Yes Historical Provider, MD  diltiazem (CARDIZEM CD) 180 MG 24 hr capsule TAKE ONE CAPSULE BY MOUTH EVERY DAY 09/29/14  Yes Noralee Space, MD  feeding supplement, ENSURE COMPLETE, (ENSURE COMPLETE) LIQD Take 237 mLs by mouth 2 (two) times daily between meals. 06/07/14  Yes Shanker Kristeen Mans, MD  furosemide (LASIX) 40 MG tablet TAKE 2 TABLETS BY MOUTH ONCE EVERY MORNING 09/21/14  Yes Noralee Space, MD  hydrOXYzine (ATARAX/VISTARIL) 10 MG tablet Take 1-2 tablets by mouth every 6 hours as needed for itching 12/15/13  Yes Noralee Space, MD  levothyroxine (SYNTHROID, LEVOTHROID) 125  MCG tablet Take 0.5 tablets (62.5 mcg total) by mouth daily before breakfast. 07/05/14  Yes Noralee Space, MD  loratadine (CLARITIN) 10 MG tablet Take 10 mg by mouth daily as needed for allergies.    Yes Historical Provider, MD  Omega-3 Fatty Acids (FISH OIL) 1000 MG CAPS Take 1 capsule by mouth daily.   Yes Historical Provider, MD  omeprazole (PRILOSEC) 20 MG capsule Take 20 mg by mouth daily.     Yes Historical Provider, MD  Polyethyl Glycol-Propyl Glycol (SYSTANE) 0.4-0.3 % SOLN Place 1 drop into both eyes 4 (four) times daily.    Yes Historical Provider, MD  potassium chloride (K-DUR) 10 MEQ tablet Take 2 tablets (20 mEq total) by mouth daily. 07/05/14  Yes Noralee Space, MD  simvastatin (ZOCOR) 20 MG tablet Take 1 tablet (20 mg total) by mouth daily. 07/05/14  Yes Noralee Space, MD  timolol (TIMOPTIC) 0.5 % ophthalmic solution Place 1 drop into both eyes 2 (two) times daily.     Yes Historical Provider, MD  traMADol (ULTRAM) 50 MG tablet TAKE 1 TABLET BY MOUTH 3 TIMES A DAY AS NEEDED FOR PAIN 07/27/14  Yes Noralee Space, MD  Diphenhyd-Hydrocort-Nystatin (FIRST-DUKES MOUTHWASH) SUSP Use as directed 5 mLs in the mouth or throat 4 (four) times daily as needed. Patient not taking: Reported on 11/24/2014 10/05/14   Noralee Space, MD  levofloxacin (LEVAQUIN) 500 MG tablet Take 1 tablet (500 mg total) by mouth daily. Patient not taking: Reported on 11/24/2014 10/05/14   Noralee Space, MD   BP 127/86 mmHg  Pulse 78  Temp(Src) 98.8 F (37.1 C) (Oral)  Resp 15  Ht 5\' 4"  (1.626 m)  Wt 132 lb 15 oz (60.3 kg)  BMI 22.81 kg/m2  SpO2 99% Physical Exam  Constitutional: She appears well-developed.  HENT:  Head: Atraumatic.  Eyes: Pupils are equal, round, and reactive to light.  Neck: Neck supple.  Cardiovascular: Normal rate.   Pulmonary/Chest: Effort normal.  Abdominal: Soft.  Musculoskeletal: Normal range of motion. She exhibits edema.  Neurological: She is alert.  Skin: There is erythema.   Erythema and some swelling of right lower leg. There are some chronic changes with 2 small vesicular areas also. The redness tracks up to her knee level. There is a pulse intact.    ED Course  Procedures (including critical care time) Labs Review Labs Reviewed  CBC WITH DIFFERENTIAL/PLATELET - Abnormal; Notable for the following:    WBC 21.5 (*)    Platelets 83 (*)    Neutrophils Relative % 90 (*)    Neutro Abs 19.2 (*)    Lymphocytes Relative 5 (*)    Monocytes Absolute 1.2 (*)    All other components within normal limits  COMPREHENSIVE METABOLIC PANEL - Abnormal; Notable for the following:  Chloride 95 (*)    CO2 34 (*)    Glucose, Bld 129 (*)    Creatinine, Ser 1.53 (*)    Albumin 3.1 (*)    GFR calc non Af Amer 27 (*)    GFR calc Af Amer 32 (*)    All other components within normal limits  URINALYSIS, ROUTINE W REFLEX MICROSCOPIC - Abnormal; Notable for the following:    Hgb urine dipstick SMALL (*)    Protein, ur 30 (*)    All other components within normal limits  URINE MICROSCOPIC-ADD ON - Abnormal; Notable for the following:    Casts GRANULAR CAST (*)    All other components within normal limits  CULTURE, BLOOD (ROUTINE X 2)  CULTURE, BLOOD (ROUTINE X 2)  MRSA PCR SCREENING  TSH  HEMOGLOBIN A1C  OCCULT BLOOD X 1 CARD TO LAB, STOOL  COMPREHENSIVE METABOLIC PANEL  CBC  I-STAT CG4 LACTIC ACID, ED    Imaging Review Dg Chest 2 View  11/24/2014   CLINICAL DATA:  Fever for 3 days  EXAM: CHEST  2 VIEW  COMPARISON:  07/05/2014  FINDINGS: Cardiac shadow is within normal limits. The lungs are well aerated bilaterally. Very minimal left basilar atelectasis is seen. No acute bony abnormality is noted.  IMPRESSION: Minimal left basilar atelectasis.   Electronically Signed   By: Inez Catalina M.D.   On: 11/24/2014 18:56     EKG Interpretation None      MDM   Final diagnoses:  Cellulitis of right lower extremity  Hypotension, unspecified hypotension type   patient  with initial fever and myalgias. Some nausea. Appears to be a cellulitis of the right lower leg. There was 2 areas that looked like they could be fascicular and is worried about underlying abscess. 18-gauge needle was inserted into it with slight bloody drainage but no purulence. While in the ED her blood pressure decreased to the 80s. Level II code sepsis was called. Lactic acid was delayed because it had not initially been drawn with the other blood. Heart rate increased. Fluid boluses ordered. Patient initially had order for vancomycin for the cellulitis but with decrease in blood pressure Zosyn was added. Will reevaluate after first fluid bolus and may give second. We'll also wait on lactic acid result.  Lactic acid is normal. second fluid bolus given to get up to 30/kg. Infection appears to be in the right lower leg. X-ray reassuring. Will admit to step down bed.   CRITICAL CARE Performed by: Mackie Pai Total critical care time: 30 Critical care time was exclusive of separately billable procedures and treating other patients. Critical care was necessary to treat or prevent imminent or life-threatening deterioration. Critical care was time spent personally by me on the following activities: development of treatment plan with patient and/or surrogate as well as nursing, discussions with consultants, evaluation of patient's response to treatment, examination of patient, obtaining history from patient or surrogate, ordering and performing treatments and interventions, ordering and review of laboratory studies, ordering and review of radiographic studies, pulse oximetry and re-evaluation of patient's condition.   Jasper Riling. Alvino Chapel, MD 11/25/14 [phone]

## 2014-11-24 NOTE — ED Notes (Signed)
Pt c/o fever, aching all over, nausea started Tuesday-- had flu shot this year,

## 2014-11-25 DIAGNOSIS — E039 Hypothyroidism, unspecified: Secondary | ICD-10-CM

## 2014-11-25 LAB — CBC
HCT: 34 % — ABNORMAL LOW (ref 36.0–46.0)
Hemoglobin: 10.7 g/dL — ABNORMAL LOW (ref 12.0–15.0)
MCH: 28.5 pg (ref 26.0–34.0)
MCHC: 31.5 g/dL (ref 30.0–36.0)
MCV: 90.4 fL (ref 78.0–100.0)
PLATELETS: 134 10*3/uL — AB (ref 150–400)
RBC: 3.76 MIL/uL — ABNORMAL LOW (ref 3.87–5.11)
RDW: 15.2 % (ref 11.5–15.5)
WBC: 16 10*3/uL — AB (ref 4.0–10.5)

## 2014-11-25 LAB — GLUCOSE, CAPILLARY: Glucose-Capillary: 101 mg/dL — ABNORMAL HIGH (ref 70–99)

## 2014-11-25 LAB — COMPREHENSIVE METABOLIC PANEL
ALT: 9 U/L (ref 0–35)
AST: 17 U/L (ref 0–37)
Albumin: 2.3 g/dL — ABNORMAL LOW (ref 3.5–5.2)
Alkaline Phosphatase: 49 U/L (ref 39–117)
Anion gap: 8 (ref 5–15)
BUN: 27 mg/dL — ABNORMAL HIGH (ref 6–23)
CO2: 29 mmol/L (ref 19–32)
Calcium: 7.9 mg/dL — ABNORMAL LOW (ref 8.4–10.5)
Chloride: 100 mmol/L (ref 96–112)
Creatinine, Ser: 1.52 mg/dL — ABNORMAL HIGH (ref 0.50–1.10)
GFR calc Af Amer: 32 mL/min — ABNORMAL LOW (ref >90)
GFR, EST NON AFRICAN AMERICAN: 28 mL/min — AB (ref >90)
GLUCOSE: 131 mg/dL — AB (ref 70–99)
Potassium: 3.3 mmol/L — ABNORMAL LOW (ref 3.5–5.1)
Sodium: 137 mmol/L (ref 135–145)
TOTAL PROTEIN: 4.8 g/dL — AB (ref 6.0–8.3)
Total Bilirubin: 0.7 mg/dL (ref 0.3–1.2)

## 2014-11-25 LAB — TSH: TSH: 1.213 u[IU]/mL (ref 0.350–4.500)

## 2014-11-25 LAB — MRSA PCR SCREENING: MRSA by PCR: NEGATIVE

## 2014-11-25 MED ORDER — POTASSIUM CHLORIDE CRYS ER 20 MEQ PO TBCR
40.0000 meq | EXTENDED_RELEASE_TABLET | Freq: Once | ORAL | Status: AC
  Administered 2014-11-25: 40 meq via ORAL
  Filled 2014-11-25: qty 2

## 2014-11-25 MED ORDER — WHITE PETROLATUM GEL
Status: AC
  Filled 2014-11-25: qty 1

## 2014-11-25 NOTE — Progress Notes (Addendum)
[facility] PROGRESS NOTE  [name] FXT:[id number] DOB: Apr 11, 1915 DOA: 11/24/2014 PCP: Noralee Space, MD Interim summary: 81 year old admitted for fever, malaise, was found to be tachycardic, hypotensive and has cellulitis of the right lower extremity. She was admitted to medical service for management of possible sepsis from right lower extremity cellulitis.  Assessment/Plan: Sepsis from cellulitis of the right lower extremity: She was febrile, tachycardic, and hypotensive on admission, and they have all improved.  She is afebrile, tachycardia resovled and her BP is much better after she was started on IV fluids and broad spectrum IV antibiotics. Her whole right leg was erythematous, tender and swollen when compared to the right leg. Blood cultures ordered and negative so far.  Elevate leg and pain control. Lactic acid levels within normal limits.    Hypothyroidism: TSh within normal limits. Resume the home dose of synthroid.    Hypokalemia; replace as needed and recheck in am.   Anemia: normocytic appears to be at baseline when compared tolast year. Anemia panel ordered to evaluate ferritin, iron and b12 levels.   Leukocytosis: Secondary to the infection. Blood cultures pending. On broad spectrum antibiotics.    ACUTE on Stage 3 CKD: ? Volume depletion . Started her on gentle hydration and repeat labs in am. Suspect this is her baseline creatinine now. UA reviewed, no source of infection found. She denies any dysuria at this time.    Code Status: full code.  Family Communication: none at bedside Disposition Plan: pending PT eval.    Consultants:  none  Procedures:  none  Antibiotics:  IV VANCOMYCIN 3/3  IV ZOSYN 3/3  HPI/Subjective: Pain better controlled. Cheerful. No new complaints.   Objective: Filed Vitals:   11/25/14 1643  BP:   Pulse:   Temp: 99 F (37.2 C)  Resp:     Intake/Output Summary (Last 24 hours) at 11/25/14 1701 Last data  filed at 11/25/14 0700  Gross per 24 hour  Intake    573 ml  Output     50 ml  Net    523 ml   Filed Weights   11/24/14 1718 11/24/14 2000 11/24/14 2326  Weight: 60.328 kg (133 lb) 60.328 kg (133 lb) 60.3 kg (132 lb 15 oz)    Exam:   General:  Alert afebrile cheerful, pleasant  Cardiovascular: s1s2, RRR murmer present  Respiratory: clear to auscultation, no wheezing or rhonchi  Abdomen: soft non tender non distended bowel sounds heard  Musculoskeletal: right lower extremity , warm, erythematous and  tender to palpation  Data Reviewed: Basic Metabolic Panel:  Recent Labs Lab 11/24/14 1730 11/25/14 0305  NA 136 137  K 3.7 3.3*  CL 95* 100  CO2 34* 29  GLUCOSE 129* 131*  BUN 21 27*  CREATININE 1.53* 1.52*  CALCIUM 8.9 7.9*   Liver Function Tests:  Recent Labs Lab 11/24/14 1730 11/25/14 0305  AST 20 17  ALT 14 9  ALKPHOS 67 49  BILITOT 0.5 0.7  PROT 6.7 4.8*  ALBUMIN 3.1* 2.3*   No results for input(s): LIPASE, AMYLASE in the last 168 hours. No results for input(s): AMMONIA in the last 168 hours. CBC:  Recent Labs Lab 11/24/14 1730 11/25/14 0305  WBC 21.5* 16.0*  NEUTROABS 19.2*  --   HGB 13.2 10.7*  HCT 40.8 34.0*  MCV 89.3 90.4  PLT 83* 134*   Cardiac Enzymes: No results for input(s): CKTOTAL, CKMB, CKMBINDEX, TROPONINI in the last 168 hours. BNP (last 3 results) No results  for input(s): BNP in the last 8760 hours.  ProBNP (last 3 results) No results for input(s): PROBNP in the last 8760 hours.  CBG:  Recent Labs Lab 11/25/14 0821  GLUCAP 101*    Recent Results (from the past 240 hour(s))  MRSA PCR Screening     Status: None   Collection Time: 11/24/14 11:24 PM  Result Value Ref Range Status   MRSA by PCR NEGATIVE NEGATIVE Final    Comment:        The GeneXpert MRSA Assay (FDA approved for NASAL specimens only), is one component of a comprehensive MRSA colonization surveillance program. It is not intended to diagnose  MRSA infection nor to guide or monitor treatment for MRSA infections.      Studies: Dg Chest 2 View  11/24/2014   CLINICAL DATA:  Fever for 3 days  EXAM: CHEST  2 VIEW  COMPARISON:  07/05/2014  FINDINGS: Cardiac shadow is within normal limits. The lungs are well aerated bilaterally. Very minimal left basilar atelectasis is seen. No acute bony abnormality is noted.  IMPRESSION: Minimal left basilar atelectasis.   Electronically Signed   By: Inez Catalina M.D.   On: 11/24/2014 18:56    Scheduled Meds: . antiseptic oral rinse  7 mL Mouth Rinse BID  . aspirin EC  81 mg Oral Daily  . cholecalciferol  1,000 Units Oral Daily  . feeding supplement (ENSURE COMPLETE)  237 mL Oral BID BM  . folic acid  1 mg Oral Daily  . heparin  5,000 Units Subcutaneous 3 times per day  . levothyroxine  62.5 mcg Oral QAC breakfast  . loratadine  10 mg Oral Daily  . multivitamin with minerals  1 tablet Oral Daily  . pantoprazole  40 mg Oral Daily  . piperacillin-tazobactam (ZOSYN)  IV  2.25 g Intravenous 3 times per day  . polyvinyl alcohol  1 drop Both Eyes TID AC & HS  . simvastatin  20 mg Oral Daily  . sodium chloride  3 mL Intravenous Q12H  . thiamine  100 mg Oral Daily  . timolol  1 drop Both Eyes BID  . triamcinolone  1 application Topical BID  . [START ON 11/26/2014] vancomycin  750 mg Intravenous Q48H  . vitamin C  500 mg Oral Daily  . white petrolatum       Continuous Infusions: . sodium chloride 75 mL/hr at 11/25/14 0004    Principal Problem:   Cellulitis of right lower extremity Active Problems:   Hypothyroidism   Essential hypertension   CKD (chronic kidney disease), stage III   Hypotension    Time spent: 25 minutes.     [facility] Pager [phone]. If 7PM-7AM, please contact night-coverage at [email or web address], password [facility] 11/25/2014, 5:01 PM  LOS: 1 day

## 2014-11-25 NOTE — Plan of Care (Signed)
Problem: Phase I Progression Outcomes Goal: Pain controlled with appropriate interventions Outcome: Completed/Met Date Met:  11/25/14 No c/o of pain

## 2014-11-25 NOTE — Progress Notes (Signed)
UR COMPLETED  

## 2014-11-26 LAB — IRON AND TIBC
IRON: 13 ug/dL — AB (ref 42–145)
SATURATION RATIOS: 7 % — AB (ref 20–55)
TIBC: 195 ug/dL — ABNORMAL LOW (ref 250–470)
UIBC: 182 ug/dL (ref 125–400)

## 2014-11-26 LAB — CBC
HCT: 33.7 % — ABNORMAL LOW (ref 36.0–46.0)
Hemoglobin: 10.6 g/dL — ABNORMAL LOW (ref 12.0–15.0)
MCH: 28.3 pg (ref 26.0–34.0)
MCHC: 31.5 g/dL (ref 30.0–36.0)
MCV: 90.1 fL (ref 78.0–100.0)
PLATELETS: 151 10*3/uL (ref 150–400)
RBC: 3.74 MIL/uL — AB (ref 3.87–5.11)
RDW: 15.1 % (ref 11.5–15.5)
WBC: 11.6 10*3/uL — AB (ref 4.0–10.5)

## 2014-11-26 LAB — BASIC METABOLIC PANEL
ANION GAP: 5 (ref 5–15)
BUN: 29 mg/dL — AB (ref 6–23)
CHLORIDE: 103 mmol/L (ref 96–112)
CO2: 28 mmol/L (ref 19–32)
CREATININE: 1.36 mg/dL — AB (ref 0.50–1.10)
Calcium: 8.3 mg/dL — ABNORMAL LOW (ref 8.4–10.5)
GFR calc Af Amer: 37 mL/min — ABNORMAL LOW (ref >90)
GFR, EST NON AFRICAN AMERICAN: 32 mL/min — AB (ref >90)
GLUCOSE: 90 mg/dL (ref 70–99)
POTASSIUM: 3.7 mmol/L (ref 3.5–5.1)
Sodium: 136 mmol/L (ref 135–145)

## 2014-11-26 LAB — OCCULT BLOOD X 1 CARD TO LAB, STOOL: Fecal Occult Bld: NEGATIVE

## 2014-11-26 LAB — HEMOGLOBIN A1C
HEMOGLOBIN A1C: 5.7 % — AB (ref 4.8–5.6)
Mean Plasma Glucose: 117 mg/dL

## 2014-11-26 LAB — RETICULOCYTES
RBC.: 3.74 MIL/uL — ABNORMAL LOW (ref 3.87–5.11)
Retic Count, Absolute: 33.7 10*3/uL (ref 19.0–186.0)
Retic Ct Pct: 0.9 % (ref 0.4–3.1)

## 2014-11-26 LAB — VITAMIN B12: Vitamin B-12: 420 pg/mL (ref 211–911)

## 2014-11-26 LAB — GLUCOSE, CAPILLARY: Glucose-Capillary: 82 mg/dL (ref 70–99)

## 2014-11-26 LAB — FERRITIN: Ferritin: 104 ng/mL (ref 10–291)

## 2014-11-26 LAB — TROPONIN I: Troponin I: <0.03 ng/mL (ref <0.031)

## 2014-11-26 LAB — FOLATE

## 2014-11-26 MED ORDER — FERROUS SULFATE 325 (65 FE) MG PO TABS
325.0000 mg | ORAL_TABLET | Freq: Two times a day (BID) | ORAL | Status: DC
  Administered 2014-11-27 – 2014-11-28 (×4): 325 mg via ORAL
  Filled 2014-11-26 (×6): qty 1

## 2014-11-26 MED ORDER — METOPROLOL TARTRATE 1 MG/ML IV SOLN
5.0000 mg | Freq: Once | INTRAVENOUS | Status: AC
  Administered 2014-11-26: 5 mg via INTRAVENOUS
  Filled 2014-11-26: qty 5

## 2014-11-26 NOTE — Progress Notes (Signed)
At 10:50, RN notified that pt went into A-Fib.  Pt was asymptomatic.  Dr. Karleen Hampshire notified; received orders for IV Metoprolol and a 12-lead EKG.  EKG performed and Metoprolol administered per MD orders.  Vital signs stable.  Pt now in NSR.  Will continue to monitor.

## 2014-11-26 NOTE — Progress Notes (Signed)
[facility] PROGRESS NOTE  [name] XBL:[id number] DOB: 1915/08/30 DOA: 11/24/2014 PCP: Noralee Space, MD Interim summary: 81 year old admitted for fever, malaise, was found to be tachycardic, hypotensive and has cellulitis of the right lower extremity. She was admitted to medical service for management of possible sepsis from right lower extremity cellulitis.  Assessment/Plan: Sepsis from cellulitis of the right lower extremity: She was febrile, tachycardic, and hypotensive on admission, and they have all improved.   On admission  Her whole right leg was erythematous, tender and swollen when compared to the right leg, the cellulitis changes has improved. Her leukocytosis has improved. Recommend continue the broad spectrum antibiotics for 24 hours and transition to oral antibiotics tomorrow.    Blood cultures ordered and negative so far.  Elevate leg and pain control. Lactic acid levels within normal limits.    Hypothyroidism: TSh within normal limits. Resume the home dose of synthroid.    Hypokalemia; replace as needed and recheck in am is normal.    Anemia: normocytic appears to be at baseline when compared tolast year. Anemia panel ordered to evaluate ferritin, iron and b12 levels. Low iron levels, supplement with ferrous sulfate.   Tachycardia with question arrhythmia earlier today: Converted to NSR spontaneously. Ordered echocardiogram, troponin. Patient was briefly symptomatic with palpitations. She is on aspirin 81 mg daily.   Leukocytosis: improving.  Secondary to the infection. Blood cultures pending. On broad spectrum antibiotics.    ACUTE on Stage 3 CKD: ? Volume depletion . Started her on gentle hydration and repeat labs in am show some improvement.  Continue to monitor on fluids and recheck in am.  UA reviewed, no source of infection found. She denies any dysuria at this time.    Code Status: full code.  Family Communication: none at  bedside Disposition Plan: pending PT eval.    Consultants:  none  Procedures:  none  Antibiotics:  IV VANCOMYCIN 3/3  IV ZOSYN 3/3  HPI/Subjective: Pain better controlled. Reports having palpitations.   Objective: Filed Vitals:   11/26/14 1624  BP: 103/40  Pulse: 77  Temp:   Resp: 13    Intake/Output Summary (Last 24 hours) at 11/26/14 1954 Last data filed at 11/26/14 1800  Gross per 24 hour  Intake   2913 ml  Output   1175 ml  Net   1738 ml   Filed Weights   11/24/14 1718 11/24/14 2000 11/24/14 2326  Weight: 60.328 kg (133 lb) 60.328 kg (133 lb) 60.3 kg (132 lb 15 oz)    Exam:   General:  Alert afebrile cheerful, pleasant  Cardiovascular: s1s2, tachycardic.   Respiratory: clear to auscultation, no wheezing or rhonchi  Abdomen: soft non tender non distended bowel sounds heard  Musculoskeletal: right lower extremity , warm, erythematous and  tender to palpation has improved.   Data Reviewed: Basic Metabolic Panel:  Recent Labs Lab 11/24/14 1730 11/25/14 0305 11/26/14 0242  NA 136 137 136  K 3.7 3.3* 3.7  CL 95* 100 103  CO2 34* 29 28  GLUCOSE 129* 131* 90  BUN 21 27* 29*  CREATININE 1.53* 1.52* 1.36*  CALCIUM 8.9 7.9* 8.3*   Liver Function Tests:  Recent Labs Lab 11/24/14 1730 11/25/14 0305  AST 20 17  ALT 14 9  ALKPHOS 67 49  BILITOT 0.5 0.7  PROT 6.7 4.8*  ALBUMIN 3.1* 2.3*   No results for input(s): LIPASE, AMYLASE in the last 168 hours. No results for input(s): AMMONIA in the last 168  hours. CBC:  Recent Labs Lab 11/24/14 1730 11/25/14 0305 11/26/14 0242  WBC 21.5* 16.0* 11.6*  NEUTROABS 19.2*  --   --   HGB 13.2 10.7* 10.6*  HCT 40.8 34.0* 33.7*  MCV 89.3 90.4 90.1  PLT 83* 134* 151   Cardiac Enzymes:  Recent Labs Lab 11/26/14 1320  TROPONINI <0.03   BNP (last 3 results) No results for input(s): BNP in the last 8760 hours.  ProBNP (last 3 results) No results for input(s): PROBNP in the last 8760  hours.  CBG:  Recent Labs Lab 11/25/14 0821 11/26/14 0818  GLUCAP 101* 82    Recent Results (from the past 240 hour(s))  Blood culture (routine x 2)     Status: None (Preliminary result)   Collection Time: 11/24/14  7:14 PM  Result Value Ref Range Status   Specimen Description BLOOD FOREARM RIGHT  Final   Special Requests BOTTLES DRAWN AEROBIC AND ANAEROBIC 5CC  Final   Culture   Final           BLOOD CULTURE RECEIVED NO GROWTH TO DATE CULTURE WILL BE HELD FOR 5 DAYS BEFORE ISSUING A FINAL NEGATIVE REPORT Performed at [facility]    Report Status PENDING  Incomplete  Blood culture (routine x 2)     Status: None (Preliminary result)   Collection Time: 11/24/14  7:14 PM  Result Value Ref Range Status   Specimen Description BLOOD HAND LEFT  Final   Special Requests BOTTLES DRAWN AEROBIC AND ANAEROBIC 4CC  Final   Culture   Final           BLOOD CULTURE RECEIVED NO GROWTH TO DATE CULTURE WILL BE HELD FOR 5 DAYS BEFORE ISSUING A FINAL NEGATIVE REPORT Performed at [facility]    Report Status PENDING  Incomplete  MRSA PCR Screening     Status: None   Collection Time: 11/24/14 11:24 PM  Result Value Ref Range Status   MRSA by PCR NEGATIVE NEGATIVE Final    Comment:        The GeneXpert MRSA Assay (FDA approved for NASAL specimens only), is one component of a comprehensive MRSA colonization surveillance program. It is not intended to diagnose MRSA infection nor to guide or monitor treatment for MRSA infections.      Studies: No results found.  Scheduled Meds: . antiseptic oral rinse  7 mL Mouth Rinse BID  . aspirin EC  81 mg Oral Daily  . cholecalciferol  1,000 Units Oral Daily  . feeding supplement (ENSURE COMPLETE)  237 mL Oral BID BM  . folic acid  1 mg Oral Daily  . heparin  5,000 Units Subcutaneous 3 times per day  . levothyroxine  62.5 mcg Oral QAC breakfast  . loratadine  10 mg Oral Daily  . multivitamin with minerals  1 tablet Oral Daily   . pantoprazole  40 mg Oral Daily  . piperacillin-tazobactam (ZOSYN)  IV  2.25 g Intravenous 3 times per day  . polyvinyl alcohol  1 drop Both Eyes TID AC & HS  . simvastatin  20 mg Oral Daily  . sodium chloride  3 mL Intravenous Q12H  . thiamine  100 mg Oral Daily  . timolol  1 drop Both Eyes BID  . triamcinolone  1 application Topical BID  . vancomycin  750 mg Intravenous Q48H  . vitamin C  500 mg Oral Daily   Continuous Infusions: . sodium chloride 75 mL/hr at 11/26/14 1610    Principal Problem:  Cellulitis of right lower extremity Active Problems:   Hypothyroidism   Essential hypertension   CKD (chronic kidney disease), stage III   Hypotension    Time spent: 25 minutes.     [facility] Pager [phone]. If 7PM-7AM, please contact night-coverage at [email or web address], password [facility] 11/26/2014, 7:54 PM  LOS: 2 days

## 2014-11-27 DIAGNOSIS — L03115 Cellulitis of right lower limb: Secondary | ICD-10-CM

## 2014-11-27 DIAGNOSIS — I10 Essential (primary) hypertension: Secondary | ICD-10-CM

## 2014-11-27 DIAGNOSIS — R002 Palpitations: Secondary | ICD-10-CM

## 2014-11-27 LAB — BASIC METABOLIC PANEL
Anion gap: 6 (ref 5–15)
BUN: 23 mg/dL (ref 6–23)
CHLORIDE: 106 mmol/L (ref 96–112)
CO2: 26 mmol/L (ref 19–32)
Calcium: 8.4 mg/dL (ref 8.4–10.5)
Creatinine, Ser: 1.12 mg/dL — ABNORMAL HIGH (ref 0.50–1.10)
GFR calc non Af Amer: 40 mL/min — ABNORMAL LOW (ref >90)
GFR, EST AFRICAN AMERICAN: 46 mL/min — AB (ref >90)
GLUCOSE: 84 mg/dL (ref 70–99)
Potassium: 3.7 mmol/L (ref 3.5–5.1)
Sodium: 138 mmol/L (ref 135–145)

## 2014-11-27 LAB — GLUCOSE, CAPILLARY: Glucose-Capillary: 98 mg/dL (ref 70–99)

## 2014-11-27 MED ORDER — DOXYCYCLINE HYCLATE 100 MG PO TABS
100.0000 mg | ORAL_TABLET | Freq: Two times a day (BID) | ORAL | Status: DC
  Administered 2014-11-27 – 2014-11-28 (×2): 100 mg via ORAL
  Filled 2014-11-27 (×3): qty 1

## 2014-11-27 MED ORDER — HYDRALAZINE HCL 20 MG/ML IJ SOLN
2.0000 mg | Freq: Four times a day (QID) | INTRAMUSCULAR | Status: DC | PRN
  Administered 2014-11-27: 2 mg via INTRAVENOUS
  Filled 2014-11-27: qty 1

## 2014-11-27 NOTE — Progress Notes (Signed)
VASCULAR LAB PRELIMINARY  PRELIMINARY  PRELIMINARY  PRELIMINARY  Right lower extremity venous Doppler completed.    Preliminary report:  There is no DVT or SVT noted in the right lower extremity.  There is an enlarged lymph node noted in the right groin.   Saadia Dewitt, RVT 11/27/2014, 4:30 PM

## 2014-11-27 NOTE — Progress Notes (Signed)
Pt. Arrived to the unit via wheelchair. Pt. Is alert and oriented with no signs of distress noted. Pt. Vitals appear stable with no skin issues noted. Pt. Ambulated from wheelchair to the bed with one assist and tolerated well. Educated pt. On use of staff numbers, room telephone and call bell. Call light within reach. Orders released. No further needs noted at this time.

## 2014-11-27 NOTE — Progress Notes (Signed)
Patient transported to [phone] via wheelchair.  Report called to RN.  Vital signs stable and no s/s of acute distress.

## 2014-11-27 NOTE — Progress Notes (Signed)
  Echocardiogram 2D Echocardiogram has been performed.  [name] 11/27/2014, 8:53 AM

## 2014-11-27 NOTE — Progress Notes (Signed)
[facility] PROGRESS NOTE  [name] ZDG:[id number] DOB: [birth year] DOA: 11/24/2014 PCP: Noralee Space, MD Interim summary: 82 year old admitted for fever, malaise, was found to be tachycardic, hypotensive and has cellulitis of the right lower extremity. She was admitted to medical service for management of possible sepsis from right lower extremity cellulitis.  Assessment/Plan: Sepsis from cellulitis of the right lower extremity: She was febrile, tachycardic, and hypotensive on admission, and they have resolved.   On admission  Her whole right leg was erythematous, tender and swollen when compared to the right leg, the cellulitis changes are almost gone.   Stop the IV antibiotics and change to po doxycycline to complete the course.  Meanwhile lower extremity dopplers have been ordered and are negative for DVT or SVT.  Blood cultures ordered and negative so far.  Elevate leg and pain control. Lactic acid levels within normal limits.    Hypothyroidism: TSh within normal limits. Resume the home dose of synthroid.    Hypokalemia; replace as needed and recheck in am is normal.    Anemia: normocytic stable. Repeat in am.   Tachycardia with questionable arrhythmia  On 3/5; Converted to NSR spontaneously in the next few hours. Ordered echocardiogram, troponin.  Troponin is negative.  Echocardiogram showed good LVEF and has grade 1 diastolic dysfunction, no evidence of thrombus.  She is currently asymptomatic and denies any palpitation , other than that one episode. Lower extremity dopplers are also negative for DVT.  12 lead EKG was reviewed showed sinus rhythm with PAC's.    Leukocytosis: improving.  Secondary to the infection.  Blood cultures are negative so far.    ACUTE on Stage 3 CKD: ? Volume depletion . Started her on gentle hydration and repeat labs in am show some improvement.  UA reviewed, no source of infection found. She denies any dysuria at this time.   Her renal function back to baseline. We have stopped the fluids.   Hypertension; BP is slowly creeping up. Wills top fluids and start her prn hydralazine. Recommend resuming home anti hypertensives on discharge.    Code Status: full code.  Family Communication: none at bedside, spoke to her daughter over the phone.  Disposition Plan:transfer to telemetry today and possibly home tomorrow depending on PT eval.    Consultants:  none  Procedures:  Echocardiogram.   Antibiotics:  IV VANCOMYCIN 3/3  IV ZOSYN 3/3 till 3/6  Doxycycline frm 3/7  HPI/Subjective: Reports did not have a good night.   Objective: Filed Vitals:   11/27/14 1740  BP: 159/52  Pulse: 79  Temp:   Resp:     Intake/Output Summary (Last 24 hours) at 11/27/14 1745 Last data filed at 11/27/14 1400  Gross per 24 hour  Intake   2520 ml  Output    575 ml  Net   1945 ml   Filed Weights   11/24/14 1718 11/24/14 2000 11/24/14 2326  Weight: 60.328 kg (133 lb) 60.328 kg (133 lb) 60.3 kg (132 lb 15 oz)    Exam:   General:  Alert afebrile cheerful,  Cardiovascular: s1s2,RRR, no mrg  Respiratory: clear to auscultation, no wheezing or rhonchi  Abdomen: soft non tender non distended bowel sounds heard  Musculoskeletal: right lower extremity , not tender anymore, erythema has improved.   Data Reviewed: Basic Metabolic Panel:  Recent Labs Lab 11/24/14 1730 11/25/14 0305 11/26/14 0242 11/27/14 0333  NA 136 137 136 138  K 3.7 3.3* 3.7 3.7  CL 95* 100 103  106  CO2 34* 29 28 26   GLUCOSE 129* 131* 90 84  BUN 21 27* 29* 23  CREATININE 1.53* 1.52* 1.36* 1.12*  CALCIUM 8.9 7.9* 8.3* 8.4   Liver Function Tests:  Recent Labs Lab 11/24/14 1730 11/25/14 0305  AST 20 17  ALT 14 9  ALKPHOS 67 49  BILITOT 0.5 0.7  PROT 6.7 4.8*  ALBUMIN 3.1* 2.3*   No results for input(s): LIPASE, AMYLASE in the last 168 hours. No results for input(s): AMMONIA in the last 168 hours. CBC:  Recent Labs Lab  11/24/14 1730 11/25/14 0305 11/26/14 0242  WBC 21.5* 16.0* 11.6*  NEUTROABS 19.2*  --   --   HGB 13.2 10.7* 10.6*  HCT 40.8 34.0* 33.7*  MCV 89.3 90.4 90.1  PLT 83* 134* 151   Cardiac Enzymes:  Recent Labs Lab 11/26/14 1320  TROPONINI <0.03   BNP (last 3 results) No results for input(s): BNP in the last 8760 hours.  ProBNP (last 3 results) No results for input(s): PROBNP in the last 8760 hours.  CBG:  Recent Labs Lab 11/25/14 0821 11/26/14 0818 11/27/14 0735  GLUCAP 101* 82 98    Recent Results (from the past 240 hour(s))  Blood culture (routine x 2)     Status: None (Preliminary result)   Collection Time: 11/24/14  7:14 PM  Result Value Ref Range Status   Specimen Description BLOOD FOREARM RIGHT  Final   Special Requests BOTTLES DRAWN AEROBIC AND ANAEROBIC 5CC  Final   Culture   Final           BLOOD CULTURE RECEIVED NO GROWTH TO DATE CULTURE WILL BE HELD FOR 5 DAYS BEFORE ISSUING A FINAL NEGATIVE REPORT Performed at [facility]    Report Status PENDING  Incomplete  Blood culture (routine x 2)     Status: None (Preliminary result)   Collection Time: 11/24/14  7:14 PM  Result Value Ref Range Status   Specimen Description BLOOD HAND LEFT  Final   Special Requests BOTTLES DRAWN AEROBIC AND ANAEROBIC 4CC  Final   Culture   Final           BLOOD CULTURE RECEIVED NO GROWTH TO DATE CULTURE WILL BE HELD FOR 5 DAYS BEFORE ISSUING A FINAL NEGATIVE REPORT Performed at [facility]    Report Status PENDING  Incomplete  MRSA PCR Screening     Status: None   Collection Time: 11/24/14 11:24 PM  Result Value Ref Range Status   MRSA by PCR NEGATIVE NEGATIVE Final    Comment:        The GeneXpert MRSA Assay (FDA approved for NASAL specimens only), is one component of a comprehensive MRSA colonization surveillance program. It is not intended to diagnose MRSA infection nor to guide or monitor treatment for MRSA infections.      Studies: No  results found.  Scheduled Meds: . antiseptic oral rinse  7 mL Mouth Rinse BID  . aspirin EC  81 mg Oral Daily  . cholecalciferol  1,000 Units Oral Daily  . feeding supplement (ENSURE COMPLETE)  237 mL Oral BID BM  . ferrous sulfate  325 mg Oral BID WC  . folic acid  1 mg Oral Daily  . heparin  5,000 Units Subcutaneous 3 times per day  . levothyroxine  62.5 mcg Oral QAC breakfast  . loratadine  10 mg Oral Daily  . multivitamin with minerals  1 tablet Oral Daily  . pantoprazole  40 mg Oral Daily  .  piperacillin-tazobactam (ZOSYN)  IV  2.25 g Intravenous 3 times per day  . polyvinyl alcohol  1 drop Both Eyes TID AC & HS  . simvastatin  20 mg Oral Daily  . sodium chloride  3 mL Intravenous Q12H  . thiamine  100 mg Oral Daily  . timolol  1 drop Both Eyes BID  . triamcinolone  1 application Topical BID  . vancomycin  750 mg Intravenous Q48H  . vitamin C  500 mg Oral Daily   Continuous Infusions:    Principal Problem:   Cellulitis of right lower extremity Active Problems:   Hypothyroidism   Essential hypertension   CKD (chronic kidney disease), stage III   Hypotension    Time spent: 25 minutes.     [facility] Pager [phone]. If 7PM-7AM, please contact night-coverage at [email or web address], password [facility] 11/27/2014, 5:45 PM  LOS: 3 days

## 2014-11-28 LAB — CLOSTRIDIUM DIFFICILE BY PCR: Toxigenic C. Difficile by PCR: NEGATIVE

## 2014-11-28 LAB — GLUCOSE, CAPILLARY: GLUCOSE-CAPILLARY: 78 mg/dL (ref 70–99)

## 2014-11-28 MED ORDER — THIAMINE HCL 100 MG PO TABS: 100.0000 mg | ORAL_TABLET | Freq: Every day | ORAL | Status: AC

## 2014-11-28 MED ORDER — LOPERAMIDE HCL 2 MG PO CAPS: 2.0000 mg | ORAL_CAPSULE | ORAL | Status: AC | PRN

## 2014-11-28 MED ORDER — DILTIAZEM HCL ER COATED BEADS 120 MG PO CP24
120.0000 mg | ORAL_CAPSULE | Freq: Every day | ORAL | Status: DC
  Administered 2014-11-28: 120 mg via ORAL
  Filled 2014-11-28: qty 1

## 2014-11-28 MED ORDER — FOLIC ACID 1 MG PO TABS: 1.0000 mg | ORAL_TABLET | Freq: Every day | ORAL | Status: AC

## 2014-11-28 MED ORDER — ADULT MULTIVITAMIN W/MINERALS CH: 1.0000 | ORAL_TABLET | Freq: Every day | ORAL | Status: AC

## 2014-11-28 MED ORDER — LOPERAMIDE HCL 2 MG PO CAPS: 2.0000 mg | ORAL_CAPSULE | ORAL | Status: DC | PRN

## 2014-11-28 MED ORDER — DOXYCYCLINE HYCLATE 100 MG PO TABS: 100.0000 mg | ORAL_TABLET | Freq: Two times a day (BID) | ORAL | Status: DC

## 2014-11-28 MED ORDER — FERROUS SULFATE 325 (65 FE) MG PO TABS: 325.0000 mg | ORAL_TABLET | Freq: Two times a day (BID) | ORAL | Status: AC

## 2014-11-28 NOTE — Evaluation (Addendum)
Physical Therapy Evaluation Patient Details Name: [name] MRN: [id number] DOB: 1915-07-05 Today's Date: 11/28/2014   History of Present Illness  [name] is a 81 y.o. female presents with fever and malaise, likely due to her R LE cellulitis.  Clinical Impression  Pt admitted with/for fever and malaise likely due to R LE infection and cellulitis which has contributed to her weakness.  Pt currently limited functionally due to the problems listed below.  (see problems list.)  Pt will benefit from PT to maximize function and safety to be able to get home safely with available assist of family..     Follow Up Recommendations Home health PT    Equipment Recommendations  None recommended by PT    Recommendations for Other Services       Precautions / Restrictions Precautions Precautions: Fall      Mobility  Bed Mobility Overal bed mobility: Modified Independent                Transfers Overall transfer level: Needs assistance   Transfers: Sit to/from Stand Sit to Stand: Supervision         General transfer comment: good transfer safety  Ambulation/Gait Ambulation/Gait assistance: Min assist Ambulation Distance (Feet): 250 Feet Assistive device: 1 person hand held assist (due to no cane available) Gait Pattern/deviations: Step-through pattern;Decreased step length - right;Decreased step length - left;Decreased stride length Gait velocity: slower   General Gait Details: mildly staggered gait, with drift off to the left.  Hand holding to help balance due to no cane.  Stairs Stairs:  (not tested,  Earlier today, went down 5 flights of steps )          Wheelchair Mobility    Modified Rankin (Stroke Patients Only)       Balance Overall balance assessment: Needs assistance Sitting-balance support: No upper extremity supported Sitting balance-Leahy Scale: Good     Standing balance support: No upper extremity supported Standing  balance-Leahy Scale: Fair                               Pertinent Vitals/Pain Pain Assessment: Faces Faces Pain Scale: Hurts little more Pain Location: general pain in knees with MMT Pain Descriptors / Indicators: Aching Pain Intervention(s): Monitored during session    Home Living3/03/2015     Family/patient expects to be discharged to:: Private residence Living Arrangements: Other (Comment) (daughter lives with her.) Available Help at Discharge: Family;Available PRN/intermittently Type of Home: House Home Access: Stairs to enter Entrance Stairs-Rails: Right Entrance Stairs-Number of Steps: 3 Home Layout: One level Home Equipment: Walker - 2 wheels;Cane - single point;Toilet riser;Other (comment);Shower seat      Prior Function Level of Independence: Needs assistance   Gait / Transfers Assistance Needed: Uses SPC for household and community ambulation. 3 falls reported since January. Has life alert.  ADL's / Homemaking Assistance Needed: Does sponge baths and gets assist from daughter. Assists with cooking. Drives.        Hand Dominance        Extremity/Trunk Assessment               Lower Extremity Assessment: Overall WFL for tasks assessed         Communication   Communication: No difficulties  Cognition Arousal/Alertness: Awake/alert Behavior During Therapy: WFL for tasks assessed/performed Overall Cognitive Status: Within Functional Limits for tasks assessed  General Comments      Exercises        Assessment/Plan    PT Assessment Patient needs continued PT services  PT Diagnosis Abnormality of gait;Generalized weakness   PT Problem List Decreased strength;Decreased activity tolerance;Decreased mobility;Decreased knowledge of use of DME;Pain  PT Treatment Interventions DME instruction;Gait training;Stair training;Functional mobility training;Therapeutic activities;Patient/family education   PT Goals  (Current goals can be found in the Care Plan section) Acute Rehab PT Goals Patient Stated Goal: back to being independent at home when my daughter is at work. PT Goal Formulation: With patient Time For Goal Achievement: 12/05/14 Potential to Achieve Goals: Good    Frequency Min 3X/week   Barriers to discharge        Co-evaluation               End of Session   Activity Tolerance: Patient tolerated treatment well Patient left: in bed;with call bell/phone within reach Nurse Communication: Mobility status         Time: 0981-1914 PT Time Calculation (min) (ACUTE ONLY): 30 min   Charges:   PT Evaluation $Initial PT Evaluation Tier I: 1 Procedure PT Treatments $Gait Training: 8-22 mins   PT G Codes:        [name], Tessie Fass 11/28/2014, 12:46 PM  11/28/2014  [name], [place] [phone]  (pager)

## 2014-11-28 NOTE — Progress Notes (Signed)
Patient discharge teaching given, including activity, diet, follow-up appoints, and medications. Patient verbalized understanding of all discharge instructions. IV access was d/c'd. Vitals are stable. Skin is intact except as charted in most recent assessments. Pt to be escorted out by NT, to be driven home by family. 

## 2014-11-28 NOTE — Discharge Summary (Signed)
Physician Discharge Summary  [name] VEL:[id number] DOB: July 22, 1915 DOA: 11/24/2014  PCP: Noralee Space, MD  Admit date: 11/24/2014 Discharge date: 11/28/2014  Time spent: 30 minutes  Recommendations for Outpatient Follow-up:  Follow up with PCP in one week.  Discharge Diagnoses:  Principal Problem:   Cellulitis of right lower extremity Active Problems:   Hypothyroidism   Essential hypertension   CKD (chronic kidney disease), stage III   Hypotension   Discharge Condition: improved  Diet recommendation: low sodium diet  Filed Weights   11/24/14 1718 11/24/14 2000 11/24/14 2326  Weight: 60.328 kg (133 lb) 60.328 kg (133 lb) 60.3 kg (132 lb 15 oz)    History of present illness:  81 year old admitted for fever, malaise, was found to be tachycardic, hypotensive and has cellulitis of the right lower extremity. She was admitted to medical service for management of possible sepsis from right lower extremity cellulitis.   Hospital Course:  Sepsis from cellulitis of the right lower extremity: She was febrile, tachycardic, and hypotensive on admission, and they have resolved.   On admission Her whole right leg was erythematous, tender and swollen when compared to the right leg, the cellulitis changes are almost gone.   Stopped the IV antibiotics and change to po doxycycline to complete the course.  Meanwhile lower extremity dopplers have been ordered and are negative for DVT or SVT.  Blood cultures ordered and negative so far.  Elevate leg and pain control. Lactic acid levels within normal limits.    Hypothyroidism: TSh within normal limits. Resume the home dose of synthroid.    Hypokalemia; replace as needed and recheck in am is normal.    Anemia: normocytic stable.   Tachycardia with questionable arrhythmia On 3/5; Converted to NSR spontaneously in the next few hours. Ordered echocardiogram, troponin.  Troponin is negative.  Echocardiogram showed good LVEF and  has grade 1 diastolic dysfunction, no evidence of thrombus.  She is currently asymptomatic and denies any palpitation , other than that one episode. Lower extremity dopplers are also negative for DVT.  12 lead EKG was reviewed showed sinus rhythm with PAC's.    Leukocytosis: improving.  Secondary to the infection.  Blood cultures are negative so far.    ACUTE on Stage 3 CKD: ? Volume depletion . Started her on gentle hydration and repeat labs in am show some improvement.  UA reviewed, no source of infection found. She denies any dysuria at this time.  Her renal function back to baseline. We have stopped the fluids.   Hypertension; BP is slowly creeping up.  Recommend resuming home anti hypertensives on discharge.   Procedures:  none  Consultations:  none  Discharge Exam: Filed Vitals:   11/28/14 1024  BP: 161/48  Pulse:   Temp:   Resp:     General: alert afebrile comfortable Cardiovascular: s1s2 Respiratory: ctab  Discharge Instructions   Discharge Instructions    Discharge instructions    Complete by:  As directed   Follow up with PCP in one week post hospitalization visit.     Increase activity slowly    Complete by:  As directed           Current Discharge Medication List    START taking these medications   Details  doxycycline (VIBRA-TABS) 100 MG tablet Take 1 tablet (100 mg total) by mouth every 12 (twelve) hours. Qty: 10 tablet, Refills: 0    ferrous sulfate 325 (65 FE) MG tablet Take 1 tablet (325 mg  total) by mouth 2 (two) times daily with a meal. Qty: 60 tablet, Refills: 2    folic acid (FOLVITE) 1 MG tablet Take 1 tablet (1 mg total) by mouth daily. Qty: 30 tablet, Refills: 1    Multiple Vitamin (MULTIVITAMIN WITH MINERALS) TABS tablet Take 1 tablet by mouth daily. Qty: 30 tablet, Refills: 0    thiamine 100 MG tablet Take 1 tablet (100 mg total) by mouth daily. Qty: 30 tablet, Refills: 1      CONTINUE these medications which have  NOT CHANGED   Details  acetaminophen (TYLENOL) 325 MG tablet Take 325 mg by mouth every 4 (four) hours as needed for mild pain or moderate pain.     ALPRAZolam (XANAX) 0.25 MG tablet TAKE 1/2 TO 1 TABLET THREE TIMES A DAY AS NEEDED Qty: 90 tablet, Refills: 5    Ascorbic Acid (VITAMIN C) 500 MG tablet Take 500 mg by mouth daily.      aspirin 81 MG tablet Take 81 mg by mouth daily.      Cholecalciferol (VITAMIN D) 1000 UNITS capsule Take 1,000 Units by mouth daily.      diltiazem (CARDIZEM CD) 180 MG 24 hr capsule TAKE ONE CAPSULE BY MOUTH EVERY DAY Qty: 90 capsule, Refills: 3    feeding supplement, ENSURE COMPLETE, (ENSURE COMPLETE) LIQD Take 237 mLs by mouth 2 (two) times daily between meals. Qty: 60 Bottle, Refills: 0    hydrOXYzine (ATARAX/VISTARIL) 10 MG tablet Take 1-2 tablets by mouth every 6 hours as needed for itching Qty: 50 tablet, Refills: 1    levothyroxine (SYNTHROID, LEVOTHROID) 125 MCG tablet Take 0.5 tablets (62.5 mcg total) by mouth daily before breakfast. Qty: 45 tablet, Refills: 3    loratadine (CLARITIN) 10 MG tablet Take 10 mg by mouth daily as needed for allergies.     Omega-3 Fatty Acids (FISH OIL) 1000 MG CAPS Take 1 capsule by mouth daily.    omeprazole (PRILOSEC) 20 MG capsule Take 20 mg by mouth daily.      Polyethyl Glycol-Propyl Glycol (SYSTANE) 0.4-0.3 % SOLN Place 1 drop into both eyes 4 (four) times daily.     simvastatin (ZOCOR) 20 MG tablet Take 1 tablet (20 mg total) by mouth daily. Qty: 990 tablet, Refills: 3    timolol (TIMOPTIC) 0.5 % ophthalmic solution Place 1 drop into both eyes 2 (two) times daily.      traMADol (ULTRAM) 50 MG tablet TAKE 1 TABLET BY MOUTH 3 TIMES A DAY AS NEEDED FOR PAIN Qty: 90 tablet, Refills: 1      STOP taking these medications     furosemide (LASIX) 40 MG tablet      potassium chloride (K-DUR) 10 MEQ tablet      Diphenhyd-Hydrocort-Nystatin (FIRST-DUKES MOUTHWASH) SUSP      levofloxacin (LEVAQUIN) 500 MG  tablet      triamcinolone (KENALOG) 0.025 % cream        Allergies  Allergen Reactions  . Enablex [Darifenacin Hydrobromide Er] Other (See Comments)    Caused her throat and mouth to have bumps and feel like it was swelling  . Clarithromycin Swelling    REACTION: causes her mouth to swell  . Codeine Nausea Only  . Morphine Nausea And Vomiting  . Sulfonamide Derivatives Other (See Comments)    REACTION: unsure of reaction      The results of significant diagnostics from this hospitalization (including imaging, microbiology, ancillary and laboratory) are listed below for reference.    Significant Diagnostic Studies: Dg  Chest 2 View  11/24/2014   CLINICAL DATA:  Fever for 3 days  EXAM: CHEST  2 VIEW  COMPARISON:  07/05/2014  FINDINGS: Cardiac shadow is within normal limits. The lungs are well aerated bilaterally. Very minimal left basilar atelectasis is seen. No acute bony abnormality is noted.  IMPRESSION: Minimal left basilar atelectasis.   Electronically Signed   By: Inez Catalina M.D.   On: 11/24/2014 18:56    Microbiology: Recent Results (from the past 240 hour(s))  Blood culture (routine x 2)     Status: None (Preliminary result)   Collection Time: 11/24/14  7:14 PM  Result Value Ref Range Status   Specimen Description BLOOD FOREARM RIGHT  Final   Special Requests BOTTLES DRAWN AEROBIC AND ANAEROBIC 5CC  Final   Culture   Final           BLOOD CULTURE RECEIVED NO GROWTH TO DATE CULTURE WILL BE HELD FOR 5 DAYS BEFORE ISSUING A FINAL NEGATIVE REPORT Performed at [facility]    Report Status PENDING  Incomplete  Blood culture (routine x 2)     Status: None (Preliminary result)   Collection Time: 11/24/14  7:14 PM  Result Value Ref Range Status   Specimen Description BLOOD HAND LEFT  Final   Special Requests BOTTLES DRAWN AEROBIC AND ANAEROBIC 4CC  Final   Culture   Final           BLOOD CULTURE RECEIVED NO GROWTH TO DATE CULTURE WILL BE HELD FOR 5 DAYS BEFORE  ISSUING A FINAL NEGATIVE REPORT Performed at [facility]    Report Status PENDING  Incomplete  MRSA PCR Screening     Status: None   Collection Time: 11/24/14 11:24 PM  Result Value Ref Range Status   MRSA by PCR NEGATIVE NEGATIVE Final    Comment:        The GeneXpert MRSA Assay (FDA approved for NASAL specimens only), is one component of a comprehensive MRSA colonization surveillance program. It is not intended to diagnose MRSA infection nor to guide or monitor treatment for MRSA infections.      Labs: Basic Metabolic Panel:  Recent Labs Lab 11/24/14 1730 11/25/14 0305 11/26/14 0242 11/27/14 0333  NA 136 137 136 138  K 3.7 3.3* 3.7 3.7  CL 95* 100 103 106  CO2 34* 29 28 26   GLUCOSE 129* 131* 90 84  BUN 21 27* 29* 23  CREATININE 1.53* 1.52* 1.36* 1.12*  CALCIUM 8.9 7.9* 8.3* 8.4   Liver Function Tests:  Recent Labs Lab 11/24/14 1730 11/25/14 0305  AST 20 17  ALT 14 9  ALKPHOS 67 49  BILITOT 0.5 0.7  PROT 6.7 4.8*  ALBUMIN 3.1* 2.3*   No results for input(s): LIPASE, AMYLASE in the last 168 hours. No results for input(s): AMMONIA in the last 168 hours. CBC:  Recent Labs Lab 11/24/14 1730 11/25/14 0305 11/26/14 0242  WBC 21.5* 16.0* 11.6*  NEUTROABS 19.2*  --   --   HGB 13.2 10.7* 10.6*  HCT 40.8 34.0* 33.7*  MCV 89.3 90.4 90.1  PLT 83* 134* 151   Cardiac Enzymes:  Recent Labs Lab 11/26/14 1320  TROPONINI <0.03   BNP: BNP (last 3 results) No results for input(s): BNP in the last 8760 hours.  ProBNP (last 3 results) No results for input(s): PROBNP in the last 8760 hours.  CBG:  Recent Labs Lab 11/25/14 0821 11/26/14 0818 11/27/14 0735 11/28/14 0752  GLUCAP 101* 82 98 78  SignedHosie Poisson  [facility] 11/28/2014, 12:02 PM

## 2014-11-29 NOTE — Care Management Note (Signed)
    Page 1 of 1   11/29/2014     4:38:54 PM CARE MANAGEMENT NOTE 11/29/2014  Patient:  [name], [name]   Account Number:  [id number]  Date Initiated:  11/25/2014  Documentation initiated by:  Marvetta Gibbons  Subjective/Objective Assessment:   Pt admitted with ?sepsis     Action/Plan:   PTA pt lived at home independent wiht daughter- NCM to follow for d/c needs   Anticipated DC Date:  11/28/2014   Anticipated DC Plan:  HOME/SELF CARE         Choice offered to / List presented to:          [facility] arranged  [place] - 11 Patient Refused      Status of service:  Completed, signed off Medicare Important Message given?  YES (If response is "NO", the following Medicare IM given date fields will be blank) Date Medicare IM given:  11/28/2014 Medicare IM given by:  Tomi Bamberger Date Additional Medicare IM given:   Additional Medicare IM given by:    Discharge Disposition:  HOME/SELF CARE  Per UR Regulation:  Reviewed for med. necessity/level of care/duration of stay  If discussed at [place] of Stay Meetings, dates discussed:    Comments:  11/29/14 [place], BSN [phone] patient was dc late yesterday evening, NCM contacted patient by phone, offered hhpt for her she states she does not think she needs hhpt.

## 2014-12-01 LAB — CULTURE, BLOOD (ROUTINE X 2)
Culture: NO GROWTH
Culture: NO GROWTH

## 2014-12-06 ENCOUNTER — Encounter: Payer: Self-pay | Admitting: Pulmonary Disease

## 2014-12-06 ENCOUNTER — Other Ambulatory Visit (INDEPENDENT_AMBULATORY_CARE_PROVIDER_SITE_OTHER): Payer: Medicare Other

## 2014-12-06 ENCOUNTER — Ambulatory Visit (INDEPENDENT_AMBULATORY_CARE_PROVIDER_SITE_OTHER): Payer: Medicare Other | Admitting: Pulmonary Disease

## 2014-12-06 VITALS — BP 134/62 | HR 76 | Temp 98.0°F | Ht 60.0 in | Wt 134.4 lb

## 2014-12-06 DIAGNOSIS — I872 Venous insufficiency (chronic) (peripheral): Secondary | ICD-10-CM

## 2014-12-06 DIAGNOSIS — I10 Essential (primary) hypertension: Secondary | ICD-10-CM

## 2014-12-06 DIAGNOSIS — R609 Edema, unspecified: Secondary | ICD-10-CM | POA: Diagnosis not present

## 2014-12-06 DIAGNOSIS — M150 Primary generalized (osteo)arthritis: Secondary | ICD-10-CM | POA: Diagnosis not present

## 2014-12-06 DIAGNOSIS — N183 Chronic kidney disease, stage 3 unspecified: Secondary | ICD-10-CM

## 2014-12-06 DIAGNOSIS — L03115 Cellulitis of right lower limb: Secondary | ICD-10-CM | POA: Diagnosis not present

## 2014-12-06 DIAGNOSIS — I359 Nonrheumatic aortic valve disorder, unspecified: Secondary | ICD-10-CM

## 2014-12-06 DIAGNOSIS — M159 Polyosteoarthritis, unspecified: Secondary | ICD-10-CM

## 2014-12-06 LAB — CBC WITH DIFFERENTIAL/PLATELET
BASOS PCT: 0 % (ref 0.0–3.0)
Basophils Absolute: 0 10*3/uL (ref 0.0–0.1)
EOS ABS: 0.1 10*3/uL (ref 0.0–0.7)
EOS PCT: 0.5 % (ref 0.0–5.0)
HCT: 37.2 % (ref 36.0–46.0)
Hemoglobin: 12.4 g/dL (ref 12.0–15.0)
Lymphocytes Relative: 18.8 % (ref 12.0–46.0)
Lymphs Abs: 1.8 10*3/uL (ref 0.7–4.0)
MCHC: 33.3 g/dL (ref 30.0–36.0)
MCV: 86.6 fl (ref 78.0–100.0)
MONOS PCT: 6 % (ref 3.0–12.0)
Monocytes Absolute: 0.6 10*3/uL (ref 0.1–1.0)
Neutro Abs: 7.2 10*3/uL (ref 1.4–7.7)
Neutrophils Relative %: 74.7 % (ref 43.0–77.0)
Platelets: 422 10*3/uL — ABNORMAL HIGH (ref 150.0–400.0)
RBC: 4.3 Mil/uL (ref 3.87–5.11)
RDW: 15.9 % — AB (ref 11.5–15.5)
WBC: 9.6 10*3/uL (ref 4.0–10.5)

## 2014-12-06 LAB — BASIC METABOLIC PANEL
BUN: 37 mg/dL — ABNORMAL HIGH (ref 6–23)
CALCIUM: 9.8 mg/dL (ref 8.4–10.5)
CHLORIDE: 97 meq/L (ref 96–112)
CO2: 31 mEq/L (ref 19–32)
CREATININE: 1.2 mg/dL (ref 0.40–1.20)
GFR: 44.1 mL/min — ABNORMAL LOW (ref >60.00)
Glucose, Bld: 99 mg/dL (ref 70–99)
Potassium: 4.4 mEq/L (ref 3.5–5.1)
Sodium: 134 mEq/L — ABNORMAL LOW (ref 135–145)

## 2014-12-06 LAB — SEDIMENTATION RATE: SED RATE: 65 mm/h — AB (ref 0–22)

## 2014-12-06 MED ORDER — FLUCONAZOLE 100 MG PO TABS: ORAL_TABLET | ORAL | Status: DC

## 2014-12-06 NOTE — Patient Instructions (Signed)
Today we updated your med list in our EPIC system...     Continue your LASIX (Furosemide) fluid pill at one tab (40mg ) each AM...  Be sure to use the cool compresses for your right leg severasl times daily...    Keep your legs elevated as much as possible...    Wear the support hose when out & about...    Remember to eliminate the sodium/ salt from your diet...  For the "itch" we have prescribed DIFLUCAN 100mg - take 2 tabs once to see if it helps this problem     You also have ATARAX (Hydroxyzine) 10mg - take 1-2 tabs as needed for itching...  Today we did some follow up blood work...    We will contact you w/ the results when available...   Be sure to call us if you develop increased pain, redness, swelling in your leg...    Or any drainage, fever, etc...  Let's keep your prev sched f/u appt 04/05/15 at 10AM w/ FASTING blood work at that time.Marland KitchenMarland Kitchen

## 2014-12-06 NOTE — Progress Notes (Signed)
Subjective:    Patient ID: [name], female    DOB: 03/09/1915, 81 y.o.   MRN: [id number]  HPI 81 y/o WF here for a follow up visit... she has mult med problems including:  Hx asthmatic bronchitis;  HBP;  Ven Insuffic & edema;  Hyperchol;  Hypothyroid;  GERD/ Divertics/ Colon polyps;  Renal insuffic;  DJD;  Vit D defic;  Anxiety...  ~  SEE PREV EPIC NOTES FOR OLDER DATA >>   ~  August 16, 2013:  9moROV & [name] is c/o back & leg pain, thinks she wants another epidural shot & says she will sched this herself; she is not using the Hydrocodone, just Tylenol & we discussed trial Tramadol in addition... She is also c/o dermatitis in legs- has seen DrJordan for this & will f/u w/ them as well...     HBP> on Diltiazem180 & Furosemide 454m2Qam w/ BP=126/64 today & she denies CP, palpit, dizzy, ch in SOB, etc...    VI, Hx Edema> she knows to elim sodium, elevate, wear support hose, & the Lasix80; weight is back up 8# today to 149# w/ mild edema; [name] (Derm) prev treated dermatitis w/ new cream & it improved...     CHOL> on Simva20 Qhs + low fat diet; last FLP 5/13 showed TChol 191, TG 81, HDL 82, LDL 93... Needs to ret fasting for f/u blood work.    Hypothy> on Synthroid 12568m 1/2 tab daily; labs 3/14 showed TSH= 4.28    Renal Insuffic> Creat had prev up to 1.8 on the Lasix 27m61mab 1/14 showed BUN=30, Creat=1.5 on the Lasix80/d; repeat labs 6/14 by DrDeveshwar showed Cr=1.3 & it is 1.3 today... We reviewed prob list, meds, xrays and labs> see below for updates >> ok 2014 Flu vacine today...   LABS 11/14:  Chems- ok w/ BS=108, Cr=1.3;  Uric=4.3 on Allopurinol100;  CBC- ok w/ Hg=12.6...  ~  December 15, 2013:  78mo 67mo& Jonai was [facility] - 10/11[facility]13 w/ RUQ pain & dx w/ gallstones, cholecystitis, & had ERCP w/ sphincterotomy (DrStark) & stone extraction; treated w/ Cipro, LFTs improved, course complicated by LE cellulitis requiring addition of Vanco IV; she had f/u DrCornett, [facility] 2/15>  brief note, did not rec elective surg given her age... We reviewed the following medical problems during today's office visit >>     HBP> on Diltiazem180 & Furosemide 40mg-70m w/ BP=118/70 today & she denies CP, palpit, dizzy, ch in SOB, etc...    VI, Hx Edema> she knows to elim sodium, elevate, wear support hose, & the Lasix80; weight is back down 9# today to 137#; treated in hosp 1/15 for cellulitis...    CHOL> on Simva20 Qhs + low fat diet; last FLP 5/13 showed TChol 191, TG 81, HDL 82, LDL 93... Needs to ret fasting for f/u blood work.    Hypothy> on Synthroid 125mcg-10m tab daily; labs 3/14 showed TSH= 4.28    Renal Insuffic> Creat prev up to 1.8 on the Lasix 27mg; 120mshowed Cr betw 1.1-1.7    Derm> she has Psoriasis on her arms & has been evaluated by DrJordan/Jones; they also treated the LE cellulitis; Rec Atarax prn itching...    Anemia> Hosp 1/15 w/ gallstones and cholecystits; Hg= 7.5-9.4 We reviewed prob list, meds, xrays and labs> see below for updates >>   ~  June 14, 2014:  78mo ROV 27most hosp check> [facility] [name] 9/8 [facility]15/15 by [facility] w/ sudden fever to  103, chills, and left leg pain/ redness/ swelling=> c/w severe cellulitis; she was felt to be septic but no lesions on leg, no areas of broken skin (no topical culture material possible) and blood cultures were NEG;  CBC showed WBC up to 29K before improving to 11.7 by disch, mild anemia w/ Hg= 10-11 range, mild renal insuffic w/ BUN=39 & Cr=1.3, MRSA screen NEG, UA was clear, PCT not checked;  MRI of left leg showed diffuse SQ soft tissue swelling & edema, no discrete abscess identified, no osteomyelitis, no signs of septic arthritis, etc;  She was treated w/ empiric Vancomycin & Cefepime- improved clinically but leg remained red/ tender/ swollen copared to the uninvolved right leg;  VenDopplers were neg for DVT;  She was disch on the 7th day w/ PO KEFLEX 526m Bid x7 more days; she has been elev her leg, wearing support hose,  her Lasix dose was cut from 875mam to 4020m, other meds the same...     Since disch she has just about finished the Keflex but left leg is still markedly swollen vis-a-vie the right leg- and it is red, tender, 2-3+ pitting edema; no areas of broken skin identified; she denies systemic symptoms- no fever now, BP is stable (146/60 today),etc;  I am reluctant to stop her antibiotics at this point w/ persistent signs & symptoms in her left leg; we will recheck LABS today and refill Keflex500Bid, and refer to ID Team for their consultation...    Other medical problems as noted above... NOTE: Prev CT Abd&Pelvis 1/12 showed bilateral low density adnexal lesions, larger on the left- demonstrated on prior studies (On the right, lesion measures 3.1 cm, on the left, it measures 6.5 x 5.5 cm). No surrounding inflammatory changes are evident. Left-sided lesion has enlarged from pelvic ultrasound done 10/01/2004 at which time it measured 4.8 x 4.4 x 4.5 cm. Bilat renal cysts. Large left perianal lipoma.    We reviewed prob list, meds, xrays and labs> see below for updates >>   CXR 9/15 showed borderline cardiomeg, underlying chr lung dis, left base atx vs effusion, ?early RUL opac...   EKG 9/15 showed NSR w/ PACs, rate92, NSSTTWA, NAD...  MRI of left leg 9/15> diffuse SQ soft tissue swelling & edema, no discrete abscess identified, no osteomyelitis, no signs of septic arthritis, etc (c/w severe cellulitis).  Ven Dopplers 9/15> no evid of DVT or superficial thrombosis in left leg, enlarged LN's noted in left groin  LABS 9/15 in Hosp> Hg=10-11 range, WBC=29K=>11.7 by disch, BUN/Cr= 39/1.3 improved to 27/0.9 by disch, MRSA=neg, UA=clear...  LABS 9/15 in office f/u>  Chems- ok x Na=131, Cr=1.4;  CBC- ok x   CT Abd&Pelvis 9/15> showed no acute abnormality & no venous obstruction or lymphocele to suggest a cause for left leg swelling; Cholelithiasis; unchanged bilateral adnexal cystic lesions (probably cysts);  unchanged renal cysts and left renal atrophy; unchanged left perianal lipoma... PLAN>> we will continue her Keflex500Bid, refer for ID Consult, proceed w/ f/u CT Abd&Pelvis to eval adenopathy & left pelvis...   ~  July 05, 2014:  3wk ROV and Emme's left leg has improved; she has finished the last round of oral Keflex and her left leg shows decr swelling (she has lost 2#), less red, less tender/ painful (still no sores or drainage), walking better, no f/c/s, etc;  She continues to minimize salt in her diet, elev leg, wear support hose, & has continued her Lasix40-2Qam + K10-2Qam...  She has not yet seen ID  consult & she feels this would be a waste of time in light of her clinical improvement...  We decided to follow up on her Waller (CXR has cleared & back to baseline) & Labs (Cr=1.3, CBC is wnl, Sed improved to 54))... Rec to continue current meds/ continue leg cleansing/ elevation/ no salt/ support hose when up/ etc... ROV recheck in 58mo& sooner if needed for problems...    We reviewed prob list, meds, xrays and labs> see below for updates >>   CXR 10/15 showed clearing of patchy RUL opac & left effusion, lungs now clear & back to baseline, NAD...  LABS 101/15:  Chems- ok x Na=132, BUN=33, Cr=1.3;  CBC- ok w/ Hg=12.0, WBC=7.6;  Sed=54 (improved from 90)...  ~  October 05, 2014:  360moOV & Lisa reports that her left leg has been improved overall- she notes that her leg got sl red & swollen over Christmas, [facility] & improved she says; still taking ASA81, Lasix40-2Qam & K10-2/d... Her CC today id "feeling bad" c/o "throat";  Notes several week hx sore throat, followed by cough, yellow sput, but denies f/c/s, CP, SOB; she took several OTC meds & Robitussin- min improvement;  Exam shows Afeb, VSS, throat is neg, chest has few basilar rhonchi but no wheezing/ rales/ consolidation... We reviewed the following medical problems during today's office visit  >>     HBP> on Diltiazem180, Furosemide 4036mQam, & K10-2/d;  BP=132/64 today & she denies CP, palpit, dizzy, ch in SOB, etc...    VI, Hx Edema> she knows to elim sodium, elevate, wear support hose, & the Lasix80; weight is about stable at 134#; prev treated for cellulitis...    CHOL> on Simva20 Qhs + low fat diet; last FLP 5/13 showed TChol 191, TG 81, HDL 82, LDL 93... Needs to ret fasting for f/u blood work.    Hypothy> on Synthroid 125m86m1/2 tab daily; labs 3/14 showed TSH= 4.28    Renal Insuffic> Creat prev up to 1.8 on the Lasix 80mg52m15 Hosp showed Cr betw 1.1-1.7; last Cr (10/15) = 1.3    Derm> she has Psoriasis on her arms & has been evaluated by DrJordan/Jones; they also treated the LE cellulitis; Rec Atarax prn itching...    Anemia> Hosp 1/15 w/ gallstones and cholecystits (no surg); Hg= 7.5-9.4 at that time; last CBC 10/15 showed Hg= 12.0 We reviewed prob list, meds, xrays and labs> see below for updates >>  PLAN>> We decided to treat w/ Levaquin x5d, Depo80, Pred dosepak; she may also use Mucinex, fluids, & MMW if needed...  ~  December 06, 2014:  48mo R78mo [facility] check[facility] w/ right leg cellulitis- states she was out shopping & "over-did it", felt weak & tired then developed right leg discomfort, increased swelling/ redness/ etc; WBC was 21K, VenDopplers were neg for DVT, treated w/ Zosyn/ Vanco & changed to oral Doxy at disch;  She notes that her leg is improved- less swollen, less red, less painful but still sl tender; she notes that right leg first started giving her trouble after the birth of her last child; she had thorough vasc eval by DrEarly in the past and no surgery indicated; she knows to elim salt/sodium, elev legs, wear support hose; she has finished the Doxy100Bid given at disch & she misunderstood the DC directions and has been taking her prev Lasix80mg Q56m. We reviewed the  following medical problems during today's office visit >>  NOTE: as an afterthought she noted vag itching & wanted Rx- offered Diflucan orally & if not improved she will need Gyn eval (she has Atarax as well on her med list)...     HBP> on Diltiazem180, Furosemide 41mQam, & off KCl since disch;  BP=134/62 today & she denies CP, palpit, dizzy, ch in SOB, etc...    VI, Edema, Cellulitis> she knows to elim sodium, elevate, wear support hose; still taking Lasix80Qam; Hosp 3/16 as above & disch on Doxy- now out & right leg improved she says; RLE chr larger than LLE, sl red/ tender; Rec decr Lasix40.     CHOL> on Simva20 Qhs + low fat diet; last FLP 5/13 showed TChol 191, TG 81, HDL 82, LDL 93... Needs to ret fasting for f/u blood work.    Hypothy> on Synthroid 1222m- 1/2 tab daily; labs 3/16 showed TSH= 1.21    Renal Insuffic> Creat prev up to 1.8 on the Lasix 8011mCreat 3/16= 1.52=>1.12    Derm> she has Psoriasis on her arms & has been evaluated by DrJordan/Jones; they also treated the LE cellulitis; Rec Atarax prn itching...    Anemia> Hosp 1/15 w/ gallstones and cholecystits (no surg); Hg= 7.5-9.4 at that time & Hg improved to 12.7; labs 3/16 showed Hg= 13.2=>10.6, Fe=13 (7%sat), Ferritin=104, on FeSO4... We reviewed prob list, meds, xrays and labs> see below for updates >>   CXR 3/16 showed norm heart size, mild left base atx, otherw OK...   EKG 3/16 showed NSR, rate86, PACs, poor r prog V1-2, NAD... Marland KitchenMarland KitchenVen Dopplers 3/16 were neg for DVT...   2DEcho 3/16 showed mild LVH w/ mild focal basal septal hypertrophy, norm LVF w/ EF=60-65%, norm wall motion, Gr1DD, norm AoV, mild MR, mild LA dil, norm RV, PAsys=38...  LABS 3.16:  Chems- ok x BUN=37 due to Lasix80 she was on & we decr her to 61m110m CBC- wnl,  Sed=65;  PCT<0.10 indicating +inflamm but no infection now...  PLAN>> we decided to check f/u labs (BMet, CBC, Sed, PCT); Rec to apply cool compresses to right leg several times daily, elim salt/sodium, elev legs, wear support hose; recurrent cellulitis  episodes may require suppressive antibiotic therapy... Labs ret w/ BUN=37 7 we decr her Lasix from 80=>40/d; elev Sed w/ neg PCT & we are holding any further antibiotics for now...            Problem List:  GLAUCOMA (ICD-365.9) - hx mult eye problems w/ prev corneal transplant & glaucoma laser eye surg... on eye drops per Ophthalmology at [facility] ASTHMATIC BRONCHITIS, ACUTE (ICD-466.0) - Hx AB requiring Rx w/ Depo/ Pred/ Mucinex/ Tussionex... ~  CXR 4/11 showed vague nodular densities on left... ~  [facility]  [facility], & dx w/ pneumonia rx w/ ZPak... ~  CXR 3/12 showed borderline cardiomeg, nodular opac left base, NAD... ~  Spring 2013: she notes some allergy symptoms... ~  CXR 1/15 showed mild cardiomeg, atherosclerotic & tortuous Ao, low lung vol & sl incr markings, NAD...  [name]  CXR 9/15 showed borderline cardiomeg, underlying chr lung dis, left base atx vs effusion, ?early RUL opac... ~  CXR 10/15 showed clearing of patchy RUL opac & left effusion, lungs now clear & back to baseline, NAD ~  CXR 3/16 in hosp showed norm heart size, mild left base atx, otherw OK...   HYPERTENSION (ICD-401.9) - controlled on ASA 81mg14m  CARTIAXT 164m/d, LASIX 474m  1-2Qam... ~  7/10:  labs showed BUN=54, Creat=2.0 & rec to decr diuretics in half= one Demedex & 1/2 Aldactone. ~  11/10:  labs showed BUN=38, Creat=1.8 & rec to keep same meds. ~  8/11:  labs showed BUN=42, Creat=2.1, K=4.3 ~  1/12:  labs in ER showed BUN=56, Creat=2.67, therefore diuretics decr to Demadex20/d only. ~  3/12:  2DEcho showed mod LVH, norm sys function w/ EF=60-65% & no regional wall motion abn; Gr1DD, mibile density on AoV- prob lambl'e escrescence, mildMR. ~  5/12:  f/u labs in office showed BUN=45, Creat=1.8, on Lasix 8070mam for her edema... ~  [facility]/12:  [facility]=33, Creat=1.47, edema resolved therefore decr Lasix to 84m46m ~  1/13:  BP= 160/76 but hasn't taken meds  today; labs improved w/ BUN=26, Creat=1.3, BNP=50 ~  5/13:  BP= 140/78 & denies CP, palpit, SOB, edema; labs improved w/ BUN=28, Creat=1.2 ~  9/13:  BP= 140/60 & she denies CP, palpt, ch in SOB or edema... ~  12/13: controlled on Diltiazem180 & Furosemide 84mg63mm w/ BP=138/62 today & she denies CP, palpit, dizzy, ch inSOB, etc... ~  3/14:  BP= 130/66 on same meds; Labs stable as well w/ Cr=1.5, continue same... ~  7/14: on Diltiazem180 & Furosemide 84mg-58m w/ BP=150/64 today & she denies CP, palpit, dizzy, ch in SOB, etc. ~  11/14: BP= 126/64 on same meds, stable... ~  EKG 1/15 showed NSR, rate92, NSSTTWA, NAD... ~  3/15: on Diltiazem180 & Furosemide 84mg-249mw/ BP=118/70 today & she denies CP, palpit, dizzy, ch in SOB, etc. ~  9/15: post hosp> off Diltiazem now & still on Furosemide 84mg- 17mm & K10-2/d w/ BP=146/60 w/ leg swelling, edema, cellulitis as below... ~  1/16: on Diltiazem180, Furosemide 84mg-2Qa76m K10-2/d;  BP=132/64 today & she denies CP, palpit, dizzy, ch in SOB, etc. ~  3/16: on Diltiazem180, Furosemide 84mgQam, 65mf KCl since disch;  BP=134/62 today & she denies CP, palpit, dizzy, ch in SOB, etc  PALPITATIONS, HX OF (ICD-V12.50) ~  3/16:  EKG showed NSR, rate86, PACs, poor r prog V1-2, NAD...  VENOUS INSUFFICIENCY/ EDEMA >> swelling controlled w/ diuretics, low sodium diet, elevation, support hose, etc... RIGHT LEG SWELLING > LEFT LEG... Pt notes it's been like this since her last child was born... Hx RECURRENT CELLULITIS >>  ~  She saw DrEarly [facility]- there was nothing he could do... ~  Swelling diminished w/ low sodium, elevation, support hose, & Lasix40 1-2tabs daily... ~  Nov-Dec2013: she knows to elim sodium, elevate, wear support hose; she has been seen by [facility], DrEarly & there is nothing they can do; recently checked by Derm- DrJoPayton Mccallumw/ patch dressing & support hose; edema decr w/ Lasix 80mg/d; La54mday shows Creat=1.5... ~  7/14:[name] she knows to elim sodium,  elevate, wear support hose, & the Lasix80; weight is down 10# today to 141# w/ resolution of edema; [name] (Derm) treated dermatitis w/ new cream & improved. ~  11/14: wt is back up 8# to 149# today & she needs to elim sodium, elev legs, etc... ~  3/15: she knows to elim sodium, elevate, wear support hose, & the Lasix80; weight is back down 9# today to 137#... ~  9//15[name] [facility] w/ 4+edema etc; VenDopplers neg for DVT; no open wounds/ drainage; treated w/ IV antibiotics and disch on Keflex; has persistent erythema, pain, swelling & we decided to continue  the Keflex & refer to ID... ~  CT Abd&Pelvis 9/15> showed no acute abnormality & no venous obstruction or lymphocele to suggest a cause for left leg swelling; Cholelithiasis; unchanged bilateral adnexal cystic lesions (probably cysts); unchanged renal cysts and left renal atrophy; unchanged left perianal lipoma... ~  10/15: she is improved after the extended course of Keflex w/ decr erythema, swelling & discomfort; she did not see ID; plan is to continue same meds, cleansing, elevation, no salt, support hose... ~  3/16: she was Adm w/ right leg cellulitis- states she was out shopping & "over-did it", felt weak & tired then developed right leg discomfort, increased swelling/ redness/ etc; WBC was 21K, VenDopplers were neg for DVT, treated w/ Zosyn/ Vanco & changed to oral Doxy at disch;  She notes that her leg is improved- less swollen, less red, less painful but still sl tender;  HYPERCHOLESTEROLEMIA (ICD-272.0) - prev Rx'd by DrGegick... Now on ZOCOR 75m/d (prev Crestor5 & Lescol from [employer] ~  [facility] due to $$$) w/ TChol 285, TG 204, HDL 69, LDL 195... "he was mad at me" ~  FLP 5/11 [facility] TChol 202, TG 60, HDL 112, LDL 99 ~  She is reminded (again) to come FASTING for f/u FLP... ~  [facility]5/13 on Simva20 showed TChol 191, TG 81, HDL 82, LDL 93  HYPOTHYROIDISM  (ICD-244.9) - prev followed by DrGegick on SYNTHROID 1232m- 1/2 daily... ~  [facility] w/ TSH= 2.90, FreeT4= 1.09 ~  labs 8/11 showed TSH= 1.87 ~  Labs 3/12 showed TSH= 2.81 ~  Labs 1/13 showed TSH= 1.65 ~  Labs 3/14 showed TSH= 4.28 ~  Labs 3/16 showed TSH= 1.21  GERD (ICD-530.81) - uses PRILOSEC 202m... last EGD was 6/06 by DrPatterson showing 3cmHH, reflux...   DIVERTICULOSIS OF COLON (ICD-562.10) & COLONIC POLYPS (ICD-211.3) - last colonoscopy 6/06 was WNL- no abnormality seen...  GALLSTONE and CHOLECYSTITIS >>  ~  [facility] w/ RUQ pain & dx w/ gallstones, cholecystitis, & had ERCP w/ sphincterotomy (DrStark) & stone extraction; treated w/ Cipro, LFTs improved, course complicated by LE cellulitis requiring addition of Vanco IV; she had f/u DrCornett, [facility] 2/15> brief note, did not rec elective surg given her age. ~  CT Abd&Pelvis 9/15> showed no acute abnormality & no venous obstruction or lymphocele to suggest a cause for left leg swelling; Cholelithiasis; unchanged bilateral adnexal cystic lesions (probably cysts); unchanged renal cysts and left renal atrophy; unchanged left perianal lipoma  RENAL INSUFFICIENCY (ICD-588.9) - tough balance betw renal perfusion & diuresis for edema... ~  labs 10/08 showed BUN= 27, Creat= 1.5 ~  labs 8/09 showed BUN= 44, Creat= 1.9 ~  labs 2/10 showed BUN 46, Creat= 1.7 ~  labs 7/10 showed BUN= 54, Creat= 2.0 ~  labs 11/10 showed BUN= 38, Creat= 1.8 ~  labs 8/11 showed BUN= 42, Creat= 2.1 ~  labs in ER 1/12 showed BUN=56, Creat=2.67, rec decr diuretics to Demadex20m77monly. ~  Labs 3/12 showed improved renal function w/ BUN=31, Creat=1.4... ~  Labs 5/12 on Lasix80 showed BUN=45, Creat=1.8 ~  Labs 9/12 by DrDeveshwar showed BUN=33, Creat=1.47 ~  Labs 1/13 showed BUN=26, Creat=1.3, BNP=50... On Lasix 40mg81m. ~  Labs 5/13 showed BUN=28, Creat=1.2 ~  Labs 12/13 on Lasix80 showed BUN=30, Creat=1.5 ~   Labs 3/14 on Lasix80 showed BUN=31, Creat=1.5 ~  Labs 6/14 by DrDeveshwar on Lasix80 showed BUN=30,  Cr=1.3 ~  Labs here 11/14 on Lasix80 showed BUN=31, Cr= 1.3 ~  She was [facility] 1/15 w/ cholecystitis & stones, s/p ERCP, and Creat- 1.1 - 1.7 ~  Labs 9/15 showed Cr= 0.94 to 1.4 range... ~  Labs 10/15 showed Cr= 1.3-1.4 ~  Labs 3/16 showed Cr= 1.52=>1.12  DEGENERATIVE JOINT DISEASE (ICD-715.90) - this is her chief complaint- s/p right TKR in 1999,  left TKR 3/10... on MOBIC 7.28m Prn & VICODIN Prn as well... followed by DrYates; and had right second toe amp by DArnette Norris2010 for hammertoe + spurs... [facility] says[facility] ~  5/13:  She saw DrDeveshwar for Rheum w/ shot in hip & sl improved...  VITAMIN D DEFICIENCY (ICD-268.9) ~  labs 8/09 showed Vit D level = 12... rec- start Vit D 50,000 u weekly... ~  labs 2/10 showed Vit D level = 30... rec- continue Vit D 50K weekly... ~  labs 7/10 showed Vit D level = 39... rec- change to 1000 u OTC daily. ~  labs 8/11 showed Vit D level = 38... continue same. ~  Labs 3/14 showed Vit D level = 43... Continue 1000u daily.  ANXIETY (ICD-300.00) - on ALPRAZOLAM 0.287mid Prn... several deaths in the family... daughter who lives w/ her recently ill...  SHINGLES - she had right T9-10 shingles in 2011 & resolved w/ Valtrex, Pred, etc...   Past Surgical History  Procedure Laterality Date  . Cataract extraction    . Abdominal hysterectomy    . Breast biopsy      Benign  . Total knee arthroplasty  1999    right  . Total knee arthroplasty  11/2008    left - Dr YaLorin Mercy. Toe amputation  08/2009    Right second toe - Dr BeBeola Cord. Ercp N/A 10/06/2013    Procedure: ENDOSCOPIC RETROGRADE CHOLANGIOPANCREATOGRAPHY (ERCP);  Surgeon: MaLadene ArtistMD;  [place];  Laterality: N/A;    Outpatient Encounter Prescriptions as of 12/06/2014  Medication Sig  . acetaminophen  (TYLENOL) 325 MG tablet Take 325 mg by mouth every 4 (four) hours as needed for mild pain or moderate pain.   . Marland KitchenLPRAZolam (XANAX) 0.25 MG tablet TAKE 1/2 TO 1 TABLET THREE TIMES A DAY AS NEEDED  . Ascorbic Acid (VITAMIN C) 500 MG tablet Take 500 mg by mouth daily.    . Marland Kitchenspirin 81 MG tablet Take 81 mg by mouth daily.    . Cholecalciferol (VITAMIN D) 1000 UNITS capsule Take 1,000 Units by mouth daily.    . Marland Kitcheniltiazem (CARDIZEM CD) 180 MG 24 hr capsule TAKE ONE CAPSULE BY MOUTH EVERY DAY  . feeding supplement, ENSURE COMPLETE, (ENSURE COMPLETE) LIQD Take 237 mLs by mouth 2 (two) times daily between meals. (Patient taking differently: Take 237 mLs by mouth daily after lunch. )  . ferrous sulfate 325 (65 FE) MG tablet Take 1 tablet (325 mg total) by mouth 2 (two) times daily with a meal.  . furosemide (LASIX) 40 MG tablet Take 40 mg by mouth daily.  . Marland Kitchenevothyroxine (SYNTHROID, LEVOTHROID) 125 MCG tablet Take 0.5 tablets (62.5 mcg total) by mouth daily before breakfast.  . loperamide (IMODIUM) 2 MG capsule Take 1 capsule (2 mg total) by mouth as needed for diarrhea or loose stools.  . Multiple Vitamin (MULTIVITAMIN WITH MINERALS) TABS tablet Take 1 tablet by mouth daily.  . Omega-3 Fatty Acids (FISH OIL) 1000 MG CAPS Take 1  capsule by mouth daily.  [name] omeprazole (PRILOSEC) 20 MG capsule Take 20 mg by mouth daily.    Vladimir Faster Glycol-Propyl Glycol (SYSTANE) 0.4-0.3 % SOLN Place 1 drop into both eyes 4 (four) times daily.   . simvastatin (ZOCOR) 20 MG tablet Take 1 tablet (20 mg total) by mouth daily.  . timolol (TIMOPTIC) 0.5 % ophthalmic solution Place 1 drop into both eyes 2 (two) times daily.    . traMADol (ULTRAM) 50 MG tablet TAKE 1 TABLET BY MOUTH 3 TIMES A DAY AS NEEDED FOR PAIN  . fluconazole (DIFLUCAN) 100 MG tablet Take 2 tablets by mouth x 1 dose  . folic acid (FOLVITE) 1 MG tablet Take 1 tablet (1 mg total) by mouth daily. (Patient not taking: Reported on 12/06/2014)  . loratadine (CLARITIN)  10 MG tablet Take 10 mg by mouth daily as needed for allergies.   [name] thiamine 100 MG tablet Take 1 tablet (100 mg total) by mouth daily. (Patient not taking: Reported on 12/06/2014)  . [DISCONTINUED] hydrOXYzine (ATARAX/VISTARIL) 10 MG tablet Take 1-2 tablets by mouth every 6 hours as needed for itching (Patient not taking: Reported on 12/06/2014)    Allergies  Allergen Reactions  . Enablex [Darifenacin Hydrobromide Er] Other (See Comments)    Caused her throat and mouth to have bumps and feel like it was swelling  . Clarithromycin Swelling    REACTION: causes her mouth to swell  . Codeine Nausea Only  . Morphine Nausea And Vomiting  . Sulfonamide Derivatives Other (See Comments)    REACTION: unsure of reaction    Current Medications, Allergies, Past Medical History, Past Surgical History, Family History, and Social History were reviewed in [employer] record.    Review of Systems        See HPI - all other systems neg except as noted...  The patient complains of dyspnea on exertion, peripheral edema, muscle weakness, and difficulty walking.  The patient denies anorexia, fever, weight loss, weight gain, vision loss, decreased hearing, hoarseness, chest pain, syncope, prolonged cough, headaches, hemoptysis, abdominal pain, melena, hematochezia, severe indigestion/heartburn, hematuria, incontinence, genital sores, suspicious skin lesions, transient blindness, depression, unusual weight change, abnormal bleeding, enlarged lymph nodes, and angioedema.     Objective:   Physical Exam      WD, WN, 81 y/o WF in NAD... GENERAL:  Alert & oriented; pleasant & cooperative... HEENT:  [place]/AT, EOM- full, EACs-clear, TMs-wnl, NOSE-clear, THROAT-clear & wnl. NECK:  Supple w/ fairROM; no JVD; normal carotid impulses w/o bruits; no thyromegaly or nodules palpated; no lymphadenopathy. CHEST:  Clear, decr BS bilat w/o wheezing, rales, or rhonchi heard... HEART:  Regular Rhythm;  gr  1/6 SEM without rubs or gallops detected... ABDOMEN:  Soft & nontender; normal bowel sounds; no organomegaly or masses detected. EXT:  moderate arthritic changes; s/p bilat TKRs; mild varicose veins/ +venous insuffic/ 2+edema Left leg, sl red/ sl tender... s/p right second toe distal amputation... NEURO:  CN's intact;  no focal neuro deficits... DERM:  No lesions noted; no rash etc...  RADIOLOGY DATA:  Reviewed in the EPIC EMR & discussed w/ the patient...  LABORATORY DATA:  Reviewed in the EPIC EMR & discussed w/ the patient...   Assessment & Plan:    Hx Left LEG CELLULITIS, now Right LEG CELLULITIS>> ?Etiology (NOS), no broken skin or draining areas; treated empirically in [facility] 1/15 & disch on Keflex w/ persistent red/ swollen/ tender left leg; we continued the Keflex for 2wks longer & wanted ID  consult; she never got the consult but improved on the Keflex; now off the antibiotic w/ decr swelling, erythema, discomfort; REC to continue meds, elevation, no salt, support hose, etc...  12/15> she reports left leg red, incr swelling, & drainage- went to Derm, DrJordan & given cream which she reports has helped... 3/16> she was hosp w/ right leg cellulitis- swollen, red, inflammed, tender- but again NOS, no broken skin, etc; treated w/ Zosyn/ Vanco & disch on Doxy=> improved...   URI/ Bronchitis> she presented 1/16 w/ sore throat, cough, yellow sput & we decided to treat w/ Levaquin x5d, Depo80, Pred dosepak; she may also use Mucinex, fluids, & MMW if needed...  HBP>  Controlled on meds, continue same including the Lasix40- 2Qam...  Cardiac>  Prev DrRoss' note reviewed; see 2DEcho, Event Monitor reports no dangerous arrhythmias, continue meds...  Ven Insuffic/ EDEMA- improved>  evals by Cards & VVS> "there is nothing they can do"; continue elevation, no salt, Lasix80 now, compression...  CHOL>  On Simva20 & FLP 5/13 looked good...  HYPOTHYROID>  Continue Synthroid 127mg/d taking 1/2 tab  daily...  GI>  Stable now s/p ERCP for gallstones; followed by DrStark now... Episode of cholecystitis, gallstones 1/15- s/p ERCP & improved w/o surg (DrStark, DrCornett)...  Renal Insuffic>  Careful w/ diuresis, NSAIDs etc; Creat varied 1.1 to 1.7  DJD>  She saw DrDeveshwar for her Rheum complaints==> shot in knee; DrYates/ NErnestina Patcheshave signed off; may need pain clinic....  Other problems as noted...   Patient's Medications  New Prescriptions   FLUCONAZOLE (DIFLUCAN) 100 MG TABLET    Take 2 tablets by mouth x 1 dose  Previous Medications   ACETAMINOPHEN (TYLENOL) 325 MG TABLET    Take 325 mg by mouth every 4 (four) hours as needed for mild pain or moderate pain.    ALPRAZOLAM (XANAX) 0.25 MG TABLET    TAKE 1/2 TO 1 TABLET THREE TIMES A DAY AS NEEDED   ASCORBIC ACID (VITAMIN C) 500 MG TABLET    Take 500 mg by mouth daily.     ASPIRIN 81 MG TABLET    Take 81 mg by mouth daily.     CHOLECALCIFEROL (VITAMIN D) 1000 UNITS CAPSULE    Take 1,000 Units by mouth daily.     DILTIAZEM (CARDIZEM CD) 180 MG 24 HR CAPSULE    TAKE ONE CAPSULE BY MOUTH EVERY DAY   FEEDING SUPPLEMENT, ENSURE COMPLETE, (ENSURE COMPLETE) LIQD    Take 237 mLs by mouth 2 (two) times daily between meals.   FERROUS SULFATE 325 (65 FE) MG TABLET    Take 1 tablet (325 mg total) by mouth 2 (two) times daily with a meal.   FOLIC ACID (FOLVITE) 1 MG TABLET    Take 1 tablet (1 mg total) by mouth daily.   FUROSEMIDE (LASIX) 40 MG TABLET    Take 40 mg by mouth daily.   LEVOTHYROXINE (SYNTHROID, LEVOTHROID) 125 MCG TABLET    Take 0.5 tablets (62.5 mcg total) by mouth daily before breakfast.   LOPERAMIDE (IMODIUM) 2 MG CAPSULE    Take 1 capsule (2 mg total) by mouth as needed for diarrhea or loose stools.   LORATADINE (CLARITIN) 10 MG TABLET    Take 10 mg by mouth daily as needed for allergies.    MULTIPLE VITAMIN (MULTIVITAMIN WITH MINERALS) TABS TABLET    Take 1 tablet by mouth daily.   OMEGA-3 FATTY ACIDS (FISH OIL) 1000 MG CAPS     Take 1 capsule by mouth  daily.   OMEPRAZOLE (PRILOSEC) 20 MG CAPSULE    Take 20 mg by mouth daily.     POLYETHYL GLYCOL-PROPYL GLYCOL (SYSTANE) 0.4-0.3 % SOLN    Place 1 drop into both eyes 4 (four) times daily.    SIMVASTATIN (ZOCOR) 20 MG TABLET    Take 1 tablet (20 mg total) by mouth daily.   THIAMINE 100 MG TABLET    Take 1 tablet (100 mg total) by mouth daily.   TIMOLOL (TIMOPTIC) 0.5 % OPHTHALMIC SOLUTION    Place 1 drop into both eyes 2 (two) times daily.     TRAMADOL (ULTRAM) 50 MG TABLET    TAKE 1 TABLET BY MOUTH 3 TIMES A DAY AS NEEDED FOR PAIN  Modified Medications   No medications on file  Discontinued Medications   HYDROXYZINE (ATARAX/VISTARIL) 10 MG TABLET    Take 1-2 tablets by mouth every 6 hours as needed for itching

## 2014-12-07 LAB — PROCALCITONIN: Procalcitonin: <0.1

## 2014-12-26 ENCOUNTER — Other Ambulatory Visit: Payer: Self-pay | Admitting: Pulmonary Disease

## 2015-01-03 DIAGNOSIS — M7061 Trochanteric bursitis, right hip: Secondary | ICD-10-CM | POA: Diagnosis not present

## 2015-01-05 DIAGNOSIS — Z79899 Other long term (current) drug therapy: Secondary | ICD-10-CM | POA: Diagnosis not present

## 2015-01-05 DIAGNOSIS — M2550 Pain in unspecified joint: Secondary | ICD-10-CM | POA: Diagnosis not present

## 2015-01-09 ENCOUNTER — Other Ambulatory Visit: Payer: Self-pay | Admitting: Pulmonary Disease

## 2015-01-09 DIAGNOSIS — M19241 Secondary osteoarthritis, right hand: Secondary | ICD-10-CM | POA: Diagnosis not present

## 2015-01-09 DIAGNOSIS — L03115 Cellulitis of right lower limb: Secondary | ICD-10-CM | POA: Diagnosis not present

## 2015-01-09 DIAGNOSIS — M1A00X0 Idiopathic chronic gout, unspecified site, without tophus (tophi): Secondary | ICD-10-CM | POA: Diagnosis not present

## 2015-01-09 DIAGNOSIS — M160 Bilateral primary osteoarthritis of hip: Secondary | ICD-10-CM | POA: Diagnosis not present

## 2015-01-10 NOTE — Telephone Encounter (Signed)
Please advise on refill request

## 2015-02-21 DIAGNOSIS — D692 Other nonthrombocytopenic purpura: Secondary | ICD-10-CM | POA: Diagnosis not present

## 2015-02-21 DIAGNOSIS — L853 Xerosis cutis: Secondary | ICD-10-CM | POA: Diagnosis not present

## 2015-02-21 DIAGNOSIS — Z85828 Personal history of other malignant neoplasm of skin: Secondary | ICD-10-CM | POA: Diagnosis not present

## 2015-02-21 DIAGNOSIS — L723 Sebaceous cyst: Secondary | ICD-10-CM | POA: Diagnosis not present

## 2015-02-27 DIAGNOSIS — Z85828 Personal history of other malignant neoplasm of skin: Secondary | ICD-10-CM | POA: Diagnosis not present

## 2015-02-27 DIAGNOSIS — I8312 Varicose veins of left lower extremity with inflammation: Secondary | ICD-10-CM | POA: Diagnosis not present

## 2015-02-27 DIAGNOSIS — I872 Venous insufficiency (chronic) (peripheral): Secondary | ICD-10-CM | POA: Diagnosis not present

## 2015-02-27 DIAGNOSIS — I8311 Varicose veins of right lower extremity with inflammation: Secondary | ICD-10-CM | POA: Diagnosis not present

## 2015-03-30 ENCOUNTER — Other Ambulatory Visit: Payer: Self-pay | Admitting: Pulmonary Disease

## 2015-04-03 DIAGNOSIS — M7062 Trochanteric bursitis, left hip: Secondary | ICD-10-CM | POA: Diagnosis not present

## 2015-04-03 DIAGNOSIS — M47817 Spondylosis without myelopathy or radiculopathy, lumbosacral region: Secondary | ICD-10-CM | POA: Diagnosis not present

## 2015-04-03 DIAGNOSIS — M47816 Spondylosis without myelopathy or radiculopathy, lumbar region: Secondary | ICD-10-CM | POA: Diagnosis not present

## 2015-04-05 ENCOUNTER — Encounter: Payer: Self-pay | Admitting: Pulmonary Disease

## 2015-04-05 ENCOUNTER — Other Ambulatory Visit (INDEPENDENT_AMBULATORY_CARE_PROVIDER_SITE_OTHER): Payer: Medicare Other

## 2015-04-05 ENCOUNTER — Ambulatory Visit (INDEPENDENT_AMBULATORY_CARE_PROVIDER_SITE_OTHER): Payer: Medicare Other | Admitting: Pulmonary Disease

## 2015-04-05 VITALS — BP 120/70 | HR 73 | Temp 98.3°F | Wt 143.8 lb

## 2015-04-05 DIAGNOSIS — E038 Other specified hypothyroidism: Secondary | ICD-10-CM

## 2015-04-05 DIAGNOSIS — D638 Anemia in other chronic diseases classified elsewhere: Secondary | ICD-10-CM | POA: Diagnosis not present

## 2015-04-05 DIAGNOSIS — R0600 Dyspnea, unspecified: Secondary | ICD-10-CM

## 2015-04-05 DIAGNOSIS — Z872 Personal history of diseases of the skin and subcutaneous tissue: Secondary | ICD-10-CM

## 2015-04-05 DIAGNOSIS — N183 Chronic kidney disease, stage 3 unspecified: Secondary | ICD-10-CM

## 2015-04-05 DIAGNOSIS — R609 Edema, unspecified: Secondary | ICD-10-CM | POA: Diagnosis not present

## 2015-04-05 DIAGNOSIS — F411 Generalized anxiety disorder: Secondary | ICD-10-CM

## 2015-04-05 DIAGNOSIS — M159 Polyosteoarthritis, unspecified: Secondary | ICD-10-CM

## 2015-04-05 DIAGNOSIS — I872 Venous insufficiency (chronic) (peripheral): Secondary | ICD-10-CM | POA: Diagnosis not present

## 2015-04-05 DIAGNOSIS — I10 Essential (primary) hypertension: Secondary | ICD-10-CM

## 2015-04-05 DIAGNOSIS — M150 Primary generalized (osteo)arthritis: Secondary | ICD-10-CM

## 2015-04-05 DIAGNOSIS — R0609 Other forms of dyspnea: Secondary | ICD-10-CM | POA: Diagnosis not present

## 2015-04-05 DIAGNOSIS — I359 Nonrheumatic aortic valve disorder, unspecified: Secondary | ICD-10-CM

## 2015-04-05 LAB — CBC WITH DIFFERENTIAL/PLATELET
BASOS ABS: 0.1 10*3/uL (ref 0.0–0.1)
BASOS PCT: 0.9 % (ref 0.0–3.0)
Eosinophils Absolute: 0 10*3/uL (ref 0.0–0.7)
Eosinophils Relative: 0.1 % (ref 0.0–5.0)
HCT: 37 % (ref 36.0–46.0)
Hemoglobin: 12.1 g/dL (ref 12.0–15.0)
LYMPHS ABS: 1.5 10*3/uL (ref 0.7–4.0)
LYMPHS PCT: 22.3 % (ref 12.0–46.0)
MCHC: 32.7 g/dL (ref 30.0–36.0)
MCV: 92.9 fl (ref 78.0–100.0)
MONO ABS: 0.3 10*3/uL (ref 0.1–1.0)
MONOS PCT: 4.6 % (ref 3.0–12.0)
NEUTROS ABS: 5 10*3/uL (ref 1.4–7.7)
NEUTROS PCT: 72.1 % (ref 43.0–77.0)
Platelets: 200 10*3/uL (ref 150.0–400.0)
RBC: 3.98 Mil/uL (ref 3.87–5.11)
RDW: 17.3 % — ABNORMAL HIGH (ref 11.5–15.5)
WBC: 6.9 10*3/uL (ref 4.0–10.5)

## 2015-04-05 LAB — BASIC METABOLIC PANEL
BUN: 35 mg/dL — ABNORMAL HIGH (ref 6–23)
CHLORIDE: 99 meq/L (ref 96–112)
CO2: 29 mEq/L (ref 19–32)
Calcium: 9.8 mg/dL (ref 8.4–10.5)
Creatinine, Ser: 1.13 mg/dL (ref 0.40–1.20)
GFR: 47.24 mL/min — AB (ref >60.00)
Glucose, Bld: 100 mg/dL — ABNORMAL HIGH (ref 70–99)
Potassium: 4.5 mEq/L (ref 3.5–5.1)
Sodium: 136 mEq/L (ref 135–145)

## 2015-04-05 LAB — FERRITIN: Ferritin: 115.5 ng/mL (ref 10.0–291.0)

## 2015-04-05 LAB — IBC PANEL
IRON: 102 ug/dL (ref 42–145)
Saturation Ratios: 31.7 % (ref 20.0–50.0)
TRANSFERRIN: 230 mg/dL (ref 212.0–360.0)

## 2015-04-05 LAB — BRAIN NATRIURETIC PEPTIDE: Pro B Natriuretic peptide (BNP): 268 pg/mL — ABNORMAL HIGH (ref 0.0–100.0)

## 2015-04-05 MED ORDER — TRAMADOL HCL 50 MG PO TABS: ORAL_TABLET | ORAL | Status: DC

## 2015-04-05 NOTE — Patient Instructions (Signed)
Today we updated your med list in our EPIC system...    Continue your current medications the same...  We refilled your Tramadol per request...  Today we did some follow up blood work...    We will contact you w/ the results when available...   Stay as active as possible, and be careful...  Call for any questions...  Let's plan a follow up visit in 82mo, sooner if needed for problems.Marland KitchenMarland Kitchen

## 2015-04-10 ENCOUNTER — Encounter: Payer: Self-pay | Admitting: Pulmonary Disease

## 2015-04-10 NOTE — Progress Notes (Signed)
Subjective:    Patient ID: [name], female    DOB: 1915-08-20, 81 y.o.   MRN: [id number]  HPI 81 y/o WF here for a follow up visit... she has mult med problems including:  Hx asthmatic bronchitis;  HBP;  Ven Insuffic & edema;  Hyperchol;  Hypothyroid;  GERD/ Divertics/ Colon polyps;  Renal insuffic;  DJD;  Vit D defic;  Anxiety...  ~  SEE PREV EPIC NOTES FOR OLDER DATA >>    LABS 11/14:  Chems- ok w/ BS=108, Cr=1.3;  Uric=4.3 on Allopurinol100;  CBC- ok w/ Hg=12.6...  ~  December 15, 2013:  30moROV & [name] w/ RUQ pain & dx w/ gallstones, cholecystitis, & had ERCP w/ sphincterotomy (DrStark) & stone extraction; treated w/ Cipro, LFTs improved, course complicated by LE cellulitis requiring addition of Vanco IV; she had f/u DrCornett, CCS 2/15> brief note, did not rec elective surg given her age... We reviewed the following medical problems during today's office visit >>     HBP> on Diltiazem180 & Furosemide 471m2Qam w/ BP=118/70 today & she denies CP, palpit, dizzy, ch in SOB, etc...    VI, Hx Edema> she knows to elim sodium, elevate, wear support hose, & the Lasix80; weight is back down 9# today to 137#; treated in hosp 1/15 for cellulitis...    CHOL> on Simva20 Qhs + low fat diet; last FLP 5/13 showed TChol 191, TG 81, HDL 82, LDL 93... Needs to ret fasting for f/u blood work.    Hypothy> on Synthroid 1259m 1/2 tab daily; labs 3/14 showed TSH= 4.28    Renal Insuffic> Creat prev up to 1.8 on the Lasix 15m25m/15 showed Cr betw 1.1-1.7    Derm> she has Psoriasis on her arms & has been evaluated by DrJordan/Jones; they also treated the LE cellulitis; Rec Atarax prn itching...    Anemia> Hosp 1/15 w/ gallstones and cholecystits; Hg= 7.5-9.4 We reviewed prob list, meds, xrays and labs> see below for updates >>   ~  June 14, 2014:  50mo 28mo& [name] w/ sudden fever to 103, chills, and left leg pain/ redness/  swelling=> c/w severe cellulitis; she was felt to be septic but no lesions on leg, no areas of broken skin (no topical culture material possible) and blood cultures were NEG;  CBC showed WBC up to 29K before improving to 11.7 by disch, mild anemia w/ Hg= 10-11 range, mild renal insuffic w/ BUN=39 & Cr=1.3, MRSA screen NEG, UA was clear, PCT not checked;  MRI of left leg showed diffuse SQ soft tissue swelling & edema, no discrete abscess identified, no osteomyelitis, no signs of septic arthritis, etc;  She was treated w/ empiric Vancomycin & Cefepime- improved clinically but leg remained red/ tender/ swollen copared to the uninvolved right leg;  VenDopplers were neg for DVT;  She was disch on the 7th day w/ PO KEFLEX 500mg 15mx7 more days; she has been elev her leg, wearing support hose, her Lasix dose was cut from 15mgQa75m 40mg/d,54mer meds the same...     Since disch she has just about finished the Keflex but left leg is still markedly swollen vis-a-vie the right leg- and it is red, tender, 2-3+ pitting edema; no areas of broken skin identified; she denies systemic symptoms- no fever now, BP is stable (146/60 today),etc;  I am reluctant to stop her antibiotics at this point w/ persistent  signs & symptoms in her left leg; we will recheck LABS today and refill Keflex500Bid, and refer to ID Team for their consultation...    Other medical problems as noted above... NOTE: Prev CT Abd&Pelvis 1/12 showed bilateral low density adnexal lesions, larger on the left- demonstrated on prior studies (On the right, lesion measures 3.1 cm, on the left, it measures 6.5 x 5.5 cm). No surrounding inflammatory changes are evident. Left-sided lesion has enlarged from pelvic ultrasound done 10/01/2004 at which time it measured 4.8 x 4.4 x 4.5 cm. Bilat renal cysts. Large left perianal lipoma.    We reviewed prob list, meds, xrays and labs> see below for updates >>   CXR 9/15 showed borderline cardiomeg, underlying chr lung dis,  left base atx vs effusion, ?early RUL opac...   EKG 9/15 showed NSR w/ PACs, rate92, NSSTTWA, NAD...  MRI of left leg 9/15> diffuse SQ soft tissue swelling & edema, no discrete abscess identified, no osteomyelitis, no signs of septic arthritis, etc (c/w severe cellulitis).  Ven Dopplers 9/15> no evid of DVT or superficial thrombosis in left leg, enlarged LN's noted in left groin  LABS 9/15 in Hosp> Hg=10-11 range, WBC=29K=>11.7 by disch, BUN/Cr= 39/1.3 improved to 27/0.9 by disch, MRSA=neg, UA=clear...  LABS 9/15 in office f/u>  Chems- ok x Na=131, Cr=1.4;  CBC- ok x   CT Abd&Pelvis 9/15> showed no acute abnormality & no venous obstruction or lymphocele to suggest a cause for left leg swelling; Cholelithiasis; unchanged bilateral adnexal cystic lesions (probably cysts); unchanged renal cysts and left renal atrophy; unchanged left perianal lipoma... PLAN>> we will continue her Keflex500Bid, refer for ID Consult, proceed w/ f/u CT Abd&Pelvis to eval adenopathy & left pelvis...   ~  July 05, 2014:  3wk ROV and [name]'s left leg has improved; she has finished the last round of oral Keflex and her left leg shows decr swelling (she has lost 2#), less red, less tender/ painful (still no sores or drainage), walking better, no f/c/s, etc;  She continues to minimize salt in her diet, elev leg, wear support hose, & has continued her Lasix40-2Qam + K10-2Qam...  She has not yet seen ID consult & she feels this would be a waste of time in light of her clinical improvement...  We decided to follow up on her [facility] (CXR has cleared & back to baseline) & Labs (Cr=1.3, CBC is wnl, Sed improved to 54))... Rec to continue current meds/ continue leg cleansing/ elevation/ no salt/ support hose when up/ etc... ROV recheck in 35mo& sooner if needed for problems...    We reviewed prob list, meds, xrays and labs> see below for updates >>   CXR 10/15 showed clearing of patchy RUL opac & left effusion, lungs now  clear & back to baseline, NAD...  LABS 101/15:  Chems- ok x Na=132, BUN=33, Cr=1.3;  CBC- ok w/ Hg=12.0, WBC=7.6;  Sed=54 (improved from 90)...  ~  October 05, 2014:  368moOV & Neesa reports that her left leg has been improved overall- she notes that her leg got sl red & swollen over Christmas, [facility] & improved she says; still taking ASA81, Lasix40-2Qam & K10-2/d... Her CC today id "feeling bad" c/o "throat";  Notes several week hx sore throat, followed by cough, yellow sput, but denies f/c/s, CP, SOB; she took several OTC meds & Robitussin- min improvement;  Exam shows Afeb, VSS, throat is neg, chest has few basilar rhonchi  but no wheezing/ rales/ consolidation... We reviewed the following medical problems during today's office visit >>     HBP> on Diltiazem180, Furosemide 67m-2Qam, & K10-2/d;  BP=132/64 today & she denies CP, palpit, dizzy, ch in SOB, etc...    VI, Hx Edema> she knows to elim sodium, elevate, wear support hose, & the Lasix80; weight is about stable at 134#; prev treated for cellulitis...    CHOL> on Simva20 Qhs + low fat diet; last FLP 5/13 showed TChol 191, TG 81, HDL 82, LDL 93... Needs to ret fasting for f/u blood work.    Hypothy> on Synthroid 1274m- 1/2 tab daily; labs 3/14 showed TSH= 4.28    Renal Insuffic> Creat prev up to 1.8 on the Lasix 8061m1/15 Hosp showed Cr betw 1.1-1.7; last Cr (10/15) = 1.3    Derm> she has Psoriasis on her arms & has been evaluated by DrJordan/Jones; they also treated the LE cellulitis; Rec Atarax prn itching...    Anemia> Hosp 1/15 w/ gallstones and cholecystits (no surg); Hg= 7.5-9.4 at that time; last CBC 10/15 showed Hg= 12.0 We reviewed prob list, meds, xrays and labs> see below for updates >>  PLAN>> We decided to treat w/ Levaquin x5d, Depo80, Pred dosepak; she may also use Mucinex, fluids, & MMW if needed...  ~  December 06, 2014:  70mo17mo & [name] w/ right leg cellulitis- states she was out shopping & "over-did it", felt weak & tired then developed right leg discomfort, increased swelling/ redness/ etc; WBC was 21K, VenDopplers were neg for DVT, treated w/ Zosyn/ Vanco & changed to oral Doxy at disch;  She notes that her leg is improved- less swollen, less red, less painful but still sl tender; she notes that right leg first started giving her trouble after the birth of her last child; she had thorough vasc eval by DrEarly in the past and no surgery indicated; she knows to elim salt/sodium, elev legs, wear support hose; she has finished the Doxy100Bid given at disch & she misunderstood the DC directions and has been taking her prev Lasix80mg60m... We reviewed the following medical problems during today's office visit >> NOTE: as an afterthought she noted vag itching & wanted Rx- offered Diflucan orally & if not improved she will need Gyn eval (she has Atarax as well on her med list)...     HBP> on Diltiazem180, Furosemide 40mgQ20m& off KCl since disch;  BP=134/62 today & she denies CP, palpit, dizzy, ch in SOB, etc...    VI, Edema, Cellulitis> she knows to elim sodium, elevate, wear support hose; still taking Lasix80Qam; Hosp 3/16 as above & disch on Doxy- now out & right leg improved she says; RLE chr larger than LLE, sl red/ tender; Rec decr Lasix40.     CHOL> on Simva20 Qhs + low fat diet; last FLP 5/13 showed TChol 191, TG 81, HDL 82, LDL 93... Needs to ret fasting for f/u blood work.    Hypothy> on Synthroid 125mcg-97m tab daily; labs 3/16 showed TSH= 1.21    Renal Insuffic> Creat prev up to 1.8 on the Lasix 80mg; C24m 3/16= 1.52=>1.12    Derm> she has Psoriasis on her arms & has been evaluated by DrJordan/Jones; they also treated the LE cellulitis; Rec Atarax prn itching...    Anemia> Hosp 1/15 w/ gallstones and cholecystits (no surg); Hg= 7.5-9.4 at that time & Hg improved to 12.7; labs 3/16 showed  Hg= 13.2=>10.6, Fe=13 (7%sat),  Ferritin=104, on FeSO4... We reviewed prob list, meds, xrays and labs> see below for updates >>   CXR 3/16 showed norm heart size, mild left base atx, otherw OK...   EKG 3/16 showed NSR, rate86, PACs, poor r prog V1-2, NAD.Marland KitchenMarland Kitchen   Ven Dopplers 3/16 were neg for DVT...   2DEcho 3/16 showed mild LVH w/ mild focal basal septal hypertrophy, norm LVF w/ EF=60-65%, norm wall motion, Gr1DD, norm AoV, mild MR, mild LA dil, norm RV, PAsys=38...  LABS 3.16:  Chems- ok x BUN=37 due to Lasix80 she was on & we decr her to 59m/d; CBC- wnl,  Sed=65;  PCT<0.10 indicating +inflamm but no infection now...  PLAN>> we decided to check f/u labs (BMet, CBC, Sed, PCT); Rec to apply cool compresses to right leg several times daily, elim salt/sodium, elev legs, wear support hose; recurrent cellulitis episodes may require suppressive antibiotic therapy... Labs ret w/ BUN=37 7 we decr her Lasix from 80=>40/d; elev Sed w/ neg PCT & we are holding any further antibiotics for now...  ~  April 05, 2015:  483moOV & Lareen's CC is orthopedic w/ hip and back pain & she tells me that DrNewton has diagnosed Gout, Rx Allopurinol300- we do not have notes from him; Recall prev evals from Rheum- DrDeveshwar, Dx w/ Gout tophi (on Allopurinol), severe OA in hands and hips, s/p bilat TKRs- on Allopurinol, Tramadol (refilled), Tylenol... We reviewed the following medical problems during today's office visit >>     HBP> on Diltiazem180, Furosemide 4083mm, & KCl Bid;  BP=120/70 today & she denies CP, palpit, dizzy, ch in SOB, etc...    VI, Edema, Cellulitis> she knows to elim sodium, elevate, wear support hose; still taking Lasix40Qam; Hosp 3/16 as above & disch on Doxy- right leg improved she says; RLE chr larger than LLE, sl red/ tender; Rec continue Lasix40.     CHOL> on Simva20 Qhs + low fat diet; last FLP 5/13 showed TChol 191, TG 81, HDL 82, LDL 93... Needs to ret fasting for f/u blood work.    Hypothy> on Synthroid 125m1m1/2 tab  daily; labs 3/16 showed TSH= 1.21    Renal Insuffic> Creat prev up to 1.8 on Lasix 80mg41meat 3/16= 1.52=>1.12; Labs 7/16 stable w/ BUN=35, Cr=1.13    Hx severe DJD, Gout w/ tophi, s/p bilat TKRs> prev followed by DrDeveshwar; on Allopurinol, Tramadol, Tylenol...    Derm> she has Psoriasis on her arms & has been evaluated by DrJordan/Jones; they also treated the LE cellulitis; Rec Atarax prn itching...    Anemia> Hosp 1/15 w/ gallstones and cholecystits (no surg); Hg= 7.5-9.4 at that time & Hg improved to 12.7; labs 7/16 showed Hg= 12.1, Fe=102 (32%sat), Ferritin=116, on FeSO4/d... We reviewed prob list, meds, xrays and labs> see below for updates >>   LABS 7/16:  Chems- ok w/ BUN=35, Cr=1.13, BS=100, BNP=268;  CBC- ok w/ Hg=12.1, Fe=102 (32%), Ferritin=116...            Problem List:  GLAUCOMA (ICD-365.9) - hx mult eye problems w/ prev corneal transplant & glaucoma laser eye surg... [facility]ICD-466.0) - Hx AB requiring Rx w/ Depo/ Pred/ Mucinex/ Tussionex... ~  CXR 4/11 showed vague nodular densities on left... ~  8/11:  [facility], & dx w/ pneumonia rx w/ ZPak... ~  CXR 3/12 showed borderline cardiomeg, nodular opac left base, NAD... ~Marland KitchenMarland Kitchen  Spring 2013: she notes some allergy symptoms... ~  CXR 1/15 showed mild cardiomeg, atherosclerotic & tortuous Ao, low lung vol & sl incr markings, NAD.[name].  ~  CXR 9/15 showed borderline cardiomeg, underlying chr lung dis, left base atx vs effusion, ?early RUL opac... ~  CXR 10/15 showed clearing of patchy RUL opac & left effusion, lungs now clear & back to baseline, NAD ~  CXR 3/16 in hosp showed norm heart size, mild left base atx, otherw OK...   HYPERTENSION (ICD-401.9) - controlled on ASA 9m/d,  CARTIAXT 1839md, LASIX 4069m 1-2Qam... ~  7/10:  labs showed BUN=54, Creat=2.0 & rec to decr diuretics in half= one Demedex & 1/2 Aldactone. ~  11/10:  labs  showed BUN=38, Creat=1.8 & rec to keep same meds. ~  8/11:  labs showed BUN=42, Creat=2.1, K=4.3 ~  1/12:  labs in ER showed BUN=56, Creat=2.67, therefore diuretics decr to Demadex20/d only. ~  3/12:  2DEcho showed mod LVH, norm sys function w/ EF=60-65% & no regional wall motion abn; Gr1DD, mibile density on AoV- prob lambl'e escrescence, mildMR. ~  5/12:  f/u labs in office showed BUN=45, Creat=1.8, on Lasix 63m35mm for her edema... ~  9/12:  [facility] BUN=33, Creat=1.47, edema resolved therefore decr Lasix to 40mg34m~  1/13:  BP= 160/76 but hasn't taken meds today; labs improved w/ BUN=26, Creat=1.3, BNP=50 ~  5/13:  BP= 140/78 & denies CP, palpit, SOB, edema; labs improved w/ BUN=28, Creat=1.2 ~  9/13:  BP= 140/60 & she denies CP, palpt, ch in SOB or edema... ~  12/13: controlled on Diltiazem180 & Furosemide 40mg-67m w/ BP=138/62 today & she denies CP, palpit, dizzy, ch inSOB, etc... ~  3/14:  BP= 130/66 on same meds; Labs stable as well w/ Cr=1.5, continue same... ~  7/14: on Diltiazem180 & Furosemide 40mg-216mw/ BP=150/64 today & she denies CP, palpit, dizzy, ch in SOB, etc. ~  11/14: BP= 126/64 on same meds, stable... ~  EKG 1/15 showed NSR, rate92, NSSTTWA, NAD... ~  3/15: on Diltiazem180 & Furosemide 40mg-2Q31m/ BP=118/70 today & she denies CP, palpit, dizzy, ch in SOB, etc. ~  9/15: post hosp> off Diltiazem now & still on Furosemide 40mg- 1-4m & K10-2/d w/ BP=146/60 w/ leg swelling, edema, cellulitis as below... ~  1/16: on Diltiazem180, Furosemide 40mg-2Qam62mK10-2/d;  BP=132/64 today & she denies CP, palpit, dizzy, ch in SOB, etc. ~  3/16: on Diltiazem180, Furosemide 40mgQam, &54m KCl since disch;  BP=134/62 today & she denies CP, palpit, dizzy, ch in SOB, etc ~  7/16: on Diltiazem180, Furosemide 40mgQam, & 76mBid;  BP=120/70 & she remains asymptomatic...  PALPITATIONS, HX OF (ICD-V12.50) ~  3/16:  EKG showed NSR, rate86, PACs, poor r prog V1-2,  NAD...  VENOUS INSUFFICIENCY/ EDEMA >> swelling controlled w/ diuretics, low sodium diet, elevation, support hose, etc... RIGHT LEG SWELLING > LEFT LEG... Pt notes it's been like this since her last child was born... Hx RECURRENT CELLULITIS >>  ~  She saw DrEarly [facility]- there was nothing he could do... ~  Swelling diminished w/ low sodium, elevation, support hose, & Lasix40 1-2tabs daily... ~  Nov-Dec2013: she knows to elim sodium, elevate, wear support hose; she has been seen by [facility], DrEarly & there is nothing they can do; recently checked by Derm- DrJonePayton Mccallum patch dressing & support hose; edema decr w/ Lasix 63mg/d; Lab 63my shows Creat=1.5... ~  7/14:  Marland Kitchenhe knows to elim sodium, elevate,  wear support hose, & the Lasix80; weight is down 10# today to 141# w/ resolution of edema; [name] (Derm) treated dermatitis w/ new cream & improved. ~  11/14: wt is back up 8# to 149# today & she needs to elim sodium, elev legs, etc... ~  3/15: she knows to elim sodium, elevate, wear support hose, & the Lasix80; weight is back down 9# today to 137#.[name]. ~  9//15: she was [facility] for cellulitis in LLE (NOS) w/ 4+edema etc; VenDopplers neg for DVT; no open wounds/ drainage; treated w/ IV antibiotics and disch on Keflex; has persistent erythema, pain, swelling & we decided to continue the Keflex & refer to ID... ~  CT Abd&Pelvis 9/15> showed no acute abnormality & no venous obstruction or lymphocele to suggest a cause for left leg swelling; Cholelithiasis; unchanged bilateral adnexal cystic lesions (probably cysts); unchanged renal cysts and left renal atrophy; unchanged left perianal lipoma... ~  10/15: she is improved after the extended course of Keflex w/ decr erythema, swelling & discomfort; she did not see ID; plan is to continue same meds, cleansing, elevation, no salt, support hose... ~  3/16: she was Adm w/ right leg cellulitis- states she was out shopping & "over-did it", felt weak & tired then developed right leg  discomfort, increased swelling/ redness/ etc; WBC was 21K, VenDopplers were neg for DVT, treated w/ Zosyn/ Vanco & changed to oral Doxy at disch;  She notes that her leg is improved- less swollen, less red, less painful but still sl tender;  HYPERCHOLESTEROLEMIA (ICD-272.0) - prev Rx'd by DrGegick... Now on ZOCOR 73m/d ([facility] & [facility]  [facility] due [facility] $$$) w/ TChol 285, TG 204, HDL 69, LDL 195... "he was mad at me" ~  [facility] TG 60, HDL 112, LDL 99 ~  She is reminded (again) to come FASTING for f/u FLP... ~  [place]5/13 on Simva20 showed TChol 191, TG 81, HDL 82, LDL 93 ~  She is reminded to ret FASTING for f/u blood work...  HYPOTHYROIDISM (ICD-244.9) - prev followed by DrGegick on SYNTHROID 1235m- 1/2 daily... ~  [facility] w/ TSH= 2.90, FreeT4= 1.09 ~  labs 8/11 showed TSH= 1.87 ~  Labs 3/12 showed TSH= 2.81 ~  Labs 1/13 showed TSH= 1.65 ~  Labs 3/14 showed TSH= 4.28 ~  Labs 3/16 showed TSH= 1.21  GERD (ICD-530.81) - uses PRILOSEC 204m... last EGD was 6/06 by DrPatterson showing 3cmHH, reflux...   DIVERTICULOSIS OF COLON (ICD-562.10) & COLONIC POLYPS (ICD-211.3) - last colonoscopy 6/06 was WNL- no abnormality seen...  GALLSTONE and CHOLECYSTITIS >>  ~  [facility] w/ RUQ pain & dx w/ gallstones, cholecystitis, & had ERCP w/ sphincterotomy (DrStark) & stone extraction; treated w/ Cipro, LFTs improved, course complicated by LE cellulitis requiring addition of Vanco IV; she had f/u DrCornett, CCS 2/15> brief note, did not rec elective surg given her age. ~  CT Abd&Pelvis 9/15> showed no acute abnormality & no venous obstruction or lymphocele to suggest a cause for left leg swelling; Cholelithiasis; unchanged bilateral adnexal cystic lesions (probably cysts); unchanged renal cysts and left renal atrophy; unchanged left perianal lipoma  RENAL INSUFFICIENCY  (ICD-588.9) - tough balance betw renal perfusion & diuresis for edema... ~  labs 10/08 showed BUN= 27, Creat= 1.5 ~  labs 8/09 showed BUN= 44, Creat= 1.9 ~  labs 2/10 showed BUN  46, Creat= 1.7 ~  labs 7/10 showed BUN= 54, Creat= 2.0 ~  labs 11/10 showed BUN= 38, Creat= 1.8 ~  labs 8/11 showed BUN= 42, Creat= 2.1 ~  labs in ER 1/12 showed BUN=56, Creat=2.67, rec decr diuretics to Demadex38m/d only. ~  Labs 3/12 showed improved renal function w/ BUN=31, Creat=1.4... ~  Labs 5/12 on Lasix80 showed BUN=45, Creat=1.8 ~  Labs 9/12 by DrDeveshwar showed BUN=33, Creat=1.47 ~  Labs 1/13 showed BUN=26, Creat=1.3, BNP=50... On Lasix 447mQam. ~  Labs 5/13 showed BUN=28, Creat=1.2 ~  Labs 12/13 on Lasix80 showed BUN=30, Creat=1.5 ~  Labs 3/14 on Lasix80 showed BUN=31, Creat=1.5 ~  Labs 6/14 by DrDeveshwar on Lasix80 showed BUN=30, Cr=1.3 ~  Labs here 11/14 on Lasix80 showed BUN=31, Cr= 1.3 ~  [facility], s/p ERCP, and Creat- 1.1 - 1.7 ~  Labs 9/15 showed Cr= 0.94 to 1.4 range... ~  Labs 10/15 showed Cr= 1.3-1.4 ~  Labs 3/16 showed Cr= 1.52=>1.12 ~  Labs 7/16 showed BUN= 35, Cr= 1.13  DEGENERATIVE JOINT DISEASE (ICD-715.90) - this is her chief complaint- s/p right TKR in 1999,  left TKR 3/10... on MOBIC 7.19m1mrn & VICODIN Prn as well... followed by DrYates; and had right second toe amp by DrBArnette Norris10 for hammertoe + spurs... also [facility]without benefit she says)... ~  5/13:  She saw DrDeveshwar for Rheum w/ shot in hip & sl improved... 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 rec- start Vit D 50,000 u weekly... ~  labs 2/10 showed Vit D level = 30... rec- continue Vit D 50K weekly... ~  labs 7/10 showed Vit D level = 39... rec- change to 1000 u OTC daily. ~  labs 8/11 showed Vit D level = 38... continue  same. ~  Labs 3/14 showed Vit D level = 43... Continue 1000u daily.  ANXIETY (ICD-300.00) - on ALPRAZOLAM 0.219m34m Prn... several deaths in the family... daughter who lives w/ her recently ill...  SHINGLES - she had right T9-10 shingles in 2011 & resolved w/ Valtrex, Pred, etc...   Past Surgical History  Procedure Laterality Date  . Cataract extraction    . Abdominal hysterectomy    . Breast biopsy      Benign  . Total knee arthroplasty  1999    right  . Total knee arthroplasty  11/2008    left - Dr YateLorin MercyToe amputation  08/2009    Right second toe - Dr BednBeola CordErcp N/A 10/06/2013    Procedure: ENDOSCOPIC RETROGRADE CHOLANGIOPANCREATOGRAPHY (ERCP);  Surgeon: MalcLadene Artist;  Location: [facility] OGarfieldervice: Endoscopy;  Laterality: N/A;    Outpatient Encounter Prescriptions as of 04/05/2015  Medication Sig  . acetaminophen (TYLENOL) 325 MG tablet Take 325 mg by mouth every 4 (four) hours as needed for mild pain or moderate pain.   . alMarland Kitchenopurinol (ZYLOPRIM) 300 MG tablet Take 1 tablet by mouth daily.  . ALMarland KitchenRAZolam (XANAX) 0.25 MG tablet TAKE 1/2-1 TABLET THREE TIMES A DAY AS NEEDED  . Ascorbic Acid (VITAMIN C) 500 MG tablet Take 500 mg by mouth daily.    . asMarland Kitchenirin 81 MG tablet Take 81 mg by mouth daily.    . Cholecalciferol (VITAMIN D) 1000 UNITS capsule Take 1,000  Units by mouth daily.    [name] diltiazem (CARDIZEM CD) 180 MG 24 hr capsule TAKE ONE CAPSULE BY MOUTH EVERY DAY  . feeding supplement, ENSURE COMPLETE, (ENSURE COMPLETE) LIQD Take 237 mLs by mouth 2 (two) times daily between meals. (Patient taking differently: Take 237 mLs by mouth daily after lunch. )  . ferrous sulfate 325 (65 FE) MG tablet Take 1 tablet (325 mg total) by mouth 2 (two) times daily with a meal.  . folic acid (FOLVITE) 1 MG tablet Take 1 tablet (1 mg total) by mouth daily.  . furosemide (LASIX) 40 MG tablet Take 40 mg by mouth daily.  [name] KLOR-CON M10 10 MEQ tablet Take 1 tablet by mouth 2 (two) times daily.   [name] levothyroxine (SYNTHROID, LEVOTHROID) 125 MCG tablet Take 0.5 tablets (62.5 mcg total) by mouth daily before breakfast.  . loratadine (CLARITIN) 10 MG tablet Take 10 mg by mouth daily as needed for allergies.   . Multiple Vitamin (MULTIVITAMIN WITH MINERALS) TABS tablet Take 1 tablet by mouth daily.  . mupirocin ointment (BACTROBAN) 2 % 1 APPLICATION APPLY ON THE SKIN AS DIRECTED APPLY TO AFFECTED AREA ON HAND AND COVER AS DIRECTED  . Omega-3 Fatty Acids (FISH OIL) 1000 MG CAPS Take 1 capsule by mouth daily.  [name] omeprazole (PRILOSEC) 20 MG capsule Take 20 mg by mouth daily.    Vladimir Faster Glycol-Propyl Glycol (SYSTANE) 0.4-0.3 % SOLN Place 1 drop into both eyes 4 (four) times daily.   . simvastatin (ZOCOR) 20 MG tablet Take 1 tablet (20 mg total) by mouth daily.  [name] thiamine 100 MG tablet Take 1 tablet (100 mg total) by mouth daily.  . timolol (TIMOPTIC) 0.5 % ophthalmic solution Place 1 drop into both eyes 2 (two) times daily.    . fluconazole (DIFLUCAN) 100 MG tablet Take 2 tablets by mouth x 1 dose (Patient not taking: Reported on 04/05/2015)  . loperamide (IMODIUM) 2 MG capsule Take 1 capsule (2 mg total) by mouth as needed for diarrhea or loose stools. (Patient not taking: Reported on 04/05/2015)  . traMADol (ULTRAM) 50 MG tablet TAKE 1 TABLET BY MOUTH 3 TIMES A DAY AS NEEDED FOR PAIN  . [DISCONTINUED] traMADol (ULTRAM) 50 MG tablet TAKE 1 TABLET BY MOUTH 3 TIMES A DAY AS NEEDED FOR PAIN (Patient not taking: Reported on 04/05/2015)   No facility-administered encounter medications on file as of 04/05/2015.    Allergies  Allergen Reactions  . Enablex [Darifenacin Hydrobromide Er] Other (See Comments)    Caused her throat and mouth to have bumps and feel like it was swelling  . Clarithromycin Swelling    REACTION: causes her mouth to swell  . Codeine Nausea Only  . Morphine Nausea And Vomiting  . Sulfonamide Derivatives Other (See Comments)    REACTION: unsure of reaction    Current  Medications, Allergies, Past Medical History, Past Surgical History, Family History, and Social History were reviewed in [employer] record.    Review of Systems        See HPI - all other systems neg except as noted...  The patient complains of dyspnea on exertion, peripheral edema, muscle weakness, and difficulty walking.  The patient denies anorexia, fever, weight loss, weight gain, vision loss, decreased hearing, hoarseness, chest pain, syncope, prolonged cough, headaches, hemoptysis, abdominal pain, melena, hematochezia, severe indigestion/heartburn, hematuria, incontinence, genital sores, suspicious skin lesions, transient blindness, depression, unusual weight change, abnormal bleeding, enlarged lymph nodes, and angioedema.     Objective:  Physical Exam      WD, WN, 81 y/o WF in NAD... GENERAL:  Alert & oriented; pleasant & cooperative... HEENT:  [place]/AT, EOM- full, EACs-clear, TMs-wnl, NOSE-clear, THROAT-clear & wnl. NECK:  Supple w/ fairROM; no JVD; normal carotid impulses w/o bruits; no thyromegaly or nodules palpated; no lymphadenopathy. CHEST:  Clear, decr BS bilat w/o wheezing, rales, or rhonchi heard... HEART:  Regular Rhythm;  gr 1/6 SEM without rubs or gallops detected... ABDOMEN:  Soft & nontender; normal bowel sounds; no organomegaly or masses detected. EXT:  moderate arthritic changes; s/p bilat TKRs; mild varicose veins/ +venous insuffic/ 2+edema Left leg, sl red/ sl tender... s/p right second toe distal amputation... NEURO:  CN's intact;  no focal neuro deficits... DERM:  No lesions noted; no rash etc...  RADIOLOGY DATA:  Reviewed in the EPIC EMR & discussed w/ the patient...  LABORATORY DATA:  Reviewed in the EPIC EMR & discussed w/ the patient...   Assessment & Plan:    Hx Left LEG CELLULITIS, now Right LEG CELLULITIS>> ?Etiology (NOS), no broken skin or draining areas; treated empirically in [facility] 1/15 & disch on Keflex w/ persistent red/  swollen/ tender left leg; we continued the Keflex for 2wks longer & wanted ID consult; she never got the consult but improved on the Keflex; now off the antibiotic w/ decr swelling, erythema, discomfort; REC to continue meds, elevation, no salt, support hose, etc...  12/15> she reports left leg red, incr swelling, & drainage- went to Derm, DrJordan & given cream which she reports has helped... 3/16> she was hosp w/ right leg cellulitis- swollen, red, inflammed, tender- but again NOS, no broken skin, etc; treated w/ Zosyn/ Vanco & disch on Doxy=> improved... 7/16> stable w/o recurrent soft tissue infection   URI/ Bronchitis> she presented 1/16 w/ sore throat, cough, yellow sput & we decided to treat w/ Levaquin x5d, Depo80, Pred dosepak; she may also use Mucinex, fluids, & MMW if needed...  HBP>  Controlled on meds, continue same including the Lasix40Qam...  Cardiac>  Prev DrRoss' note reviewed; see 2DEcho, Event Monitor reports no dangerous arrhythmias, continue meds...  Ven Insuffic/ EDEMA- improved>  evals by Cards & VVS> "there is nothing they can do"; continue elevation, no salt, Lasix80 now, compression...  CHOL>  On Simva20 & FLP 5/13 looked good...  HYPOTHYROID>  Continue Synthroid 135mg/d taking 1/2 tab daily...  GI>  Stable now s/p ERCP for gallstones; followed by DrStark now... Episode of cholecystitis, gallstones 1/15- s/p ERCP & improved w/o surg (DrStark, DrCornett)...  Renal Insuffic>  Careful w/ diuresis, NSAIDs etc; Creat varied 1.1 to 1.7  DJD>  She saw DrDeveshwar for her Rheum complaints==> shot in knee; DrYates/ NErnestina Patcheshave signed off; may need pain clinic....  Other problems as noted...   Patient's Medications  New Prescriptions   No medications on file  Previous Medications   ACETAMINOPHEN (TYLENOL) 325 MG TABLET    Take 325 mg by mouth every 4 (four) hours as needed for mild pain or moderate pain.    ALLOPURINOL (ZYLOPRIM) 300 MG TABLET    Take 1 tablet by  mouth daily.   ALPRAZOLAM (XANAX) 0.25 MG TABLET    TAKE 1/2-1 TABLET THREE TIMES A DAY AS NEEDED   ASCORBIC ACID (VITAMIN C) 500 MG TABLET    Take 500 mg by mouth daily.     ASPIRIN 81 MG TABLET    Take 81 mg by mouth daily.     CHOLECALCIFEROL (VITAMIN D) 1000 UNITS CAPSULE  Take 1,000 Units by mouth daily.     DILTIAZEM (CARDIZEM CD) 180 MG 24 HR CAPSULE    TAKE ONE CAPSULE BY MOUTH EVERY DAY   FEEDING SUPPLEMENT, ENSURE COMPLETE, (ENSURE COMPLETE) LIQD    Take 237 mLs by mouth 2 (two) times daily between meals.   FERROUS SULFATE 325 (65 FE) MG TABLET    Take 1 tablet (325 mg total) by mouth 2 (two) times daily with a meal.   FLUCONAZOLE (DIFLUCAN) 100 MG TABLET    Take 2 tablets by mouth x 1 dose   FOLIC ACID (FOLVITE) 1 MG TABLET    Take 1 tablet (1 mg total) by mouth daily.   FUROSEMIDE (LASIX) 40 MG TABLET    Take 40 mg by mouth daily.   KLOR-CON M10 10 MEQ TABLET    Take 1 tablet by mouth 2 (two) times daily.   LEVOTHYROXINE (SYNTHROID, LEVOTHROID) 125 MCG TABLET    Take 0.5 tablets (62.5 mcg total) by mouth daily before breakfast.   LOPERAMIDE (IMODIUM) 2 MG CAPSULE    Take 1 capsule (2 mg total) by mouth as needed for diarrhea or loose stools.   LORATADINE (CLARITIN) 10 MG TABLET    Take 10 mg by mouth daily as needed for allergies.    MULTIPLE VITAMIN (MULTIVITAMIN WITH MINERALS) TABS TABLET    Take 1 tablet by mouth daily.   MUPIROCIN OINTMENT (BACTROBAN) 2 %    1 APPLICATION APPLY ON THE SKIN AS DIRECTED APPLY TO AFFECTED AREA ON HAND AND COVER AS DIRECTED   OMEGA-3 FATTY ACIDS (FISH OIL) 1000 MG CAPS    Take 1 capsule by mouth daily.   OMEPRAZOLE (PRILOSEC) 20 MG CAPSULE    Take 20 mg by mouth daily.     POLYETHYL GLYCOL-PROPYL GLYCOL (SYSTANE) 0.4-0.3 % SOLN    Place 1 drop into both eyes 4 (four) times daily.    SIMVASTATIN (ZOCOR) 20 MG TABLET    Take 1 tablet (20 mg total) by mouth daily.   THIAMINE 100 MG TABLET    Take 1 tablet (100 mg total) by mouth daily.   TIMOLOL  (TIMOPTIC) 0.5 % OPHTHALMIC SOLUTION    Place 1 drop into both eyes 2 (two) times daily.    Modified Medications   Modified Medication Previous Medication   TRAMADOL (ULTRAM) 50 MG TABLET traMADol (ULTRAM) 50 MG tablet      TAKE 1 TABLET BY MOUTH 3 TIMES A DAY AS NEEDED FOR PAIN    TAKE 1 TABLET BY MOUTH 3 TIMES A DAY AS NEEDED FOR PAIN  Discontinued Medications   No medications on file

## 2015-04-19 DIAGNOSIS — M19072 Primary osteoarthritis, left ankle and foot: Secondary | ICD-10-CM | POA: Diagnosis not present

## 2015-04-19 DIAGNOSIS — M79671 Pain in right foot: Secondary | ICD-10-CM | POA: Diagnosis not present

## 2015-04-19 DIAGNOSIS — L84 Corns and callosities: Secondary | ICD-10-CM | POA: Diagnosis not present

## 2015-04-26 DIAGNOSIS — H4011X2 Primary open-angle glaucoma, moderate stage: Secondary | ICD-10-CM | POA: Diagnosis not present

## 2015-04-28 ENCOUNTER — Telehealth: Payer: Self-pay | Admitting: Pulmonary Disease

## 2015-04-28 DIAGNOSIS — R42 Dizziness and giddiness: Secondary | ICD-10-CM

## 2015-04-28 MED ORDER — MECLIZINE HCL 25 MG PO TABS: ORAL_TABLET | ORAL | Status: DC

## 2015-04-28 NOTE — Telephone Encounter (Signed)
I called spoke with pt. She c/o dizzy spells x 2-3 days now. Last for few secs. BP this AM was 129/54. No HA, no blurred vision. She wants something called in for this. Please advise SN thanks  Allergies  Allergen Reactions  . Enablex [Darifenacin Hydrobromide Er] Other (See Comments)    Caused her throat and mouth to have bumps and feel like it was swelling  . Clarithromycin Swelling    REACTION: causes her mouth to swell  . Codeine Nausea Only  . Morphine Nausea And Vomiting  . Sulfonamide Derivatives Other (See Comments)    REACTION: unsure of reaction     Current Outpatient Prescriptions on File Prior to Visit  Medication Sig Dispense Refill  . acetaminophen (TYLENOL) 325 MG tablet Take 325 mg by mouth every 4 (four) hours as needed for mild pain or moderate pain.     [name] allopurinol (ZYLOPRIM) 300 MG tablet Take 1 tablet by mouth daily.    [name] ALPRAZolam (XANAX) 0.25 MG tablet TAKE 1/2-1 TABLET THREE TIMES A DAY AS NEEDED 90 tablet 5  . Ascorbic Acid (VITAMIN C) 500 MG tablet Take 500 mg by mouth daily.      [name] aspirin 81 MG tablet Take 81 mg by mouth daily.      . Cholecalciferol (VITAMIN D) 1000 UNITS capsule Take 1,000 Units by mouth daily.      [name] diltiazem (CARDIZEM CD) 180 MG 24 hr capsule TAKE ONE CAPSULE BY MOUTH EVERY DAY 90 capsule 3  . feeding supplement, ENSURE COMPLETE, (ENSURE COMPLETE) LIQD Take 237 mLs by mouth 2 (two) times daily between meals. (Patient taking differently: Take 237 mLs by mouth daily after lunch. ) 60 Bottle 0  . ferrous sulfate 325 (65 FE) MG tablet Take 1 tablet (325 mg total) by mouth 2 (two) times daily with a meal. 60 tablet 2  . fluconazole (DIFLUCAN) 100 MG tablet Take 2 tablets by mouth x 1 dose (Patient not taking: Reported on 04/05/2015) 2 tablet 0  . folic acid (FOLVITE) 1 MG tablet Take 1 tablet (1 mg total) by mouth daily. 30 tablet 1  . furosemide (LASIX) 40 MG tablet Take 40 mg by mouth daily.    [name] KLOR-CON M10 10 MEQ tablet Take 1 tablet by  mouth 2 (two) times daily.    [name] levothyroxine (SYNTHROID, LEVOTHROID) 125 MCG tablet Take 0.5 tablets (62.5 mcg total) by mouth daily before breakfast. 45 tablet 3  . loperamide (IMODIUM) 2 MG capsule Take 1 capsule (2 mg total) by mouth as needed for diarrhea or loose stools. (Patient not taking: Reported on 04/05/2015) 10 capsule 0  . loratadine (CLARITIN) 10 MG tablet Take 10 mg by mouth daily as needed for allergies.     . Multiple Vitamin (MULTIVITAMIN WITH MINERALS) TABS tablet Take 1 tablet by mouth daily. 30 tablet 0  . mupirocin ointment (BACTROBAN) 2 % 1 APPLICATION APPLY ON THE SKIN AS DIRECTED APPLY TO AFFECTED AREA ON HAND AND COVER AS DIRECTED  0  . Omega-3 Fatty Acids (FISH OIL) 1000 MG CAPS Take 1 capsule by mouth daily.    [name] omeprazole (PRILOSEC) 20 MG capsule Take 20 mg by mouth daily.      Vladimir Faster Glycol-Propyl Glycol (SYSTANE) 0.4-0.3 % SOLN Place 1 drop into both eyes 4 (four) times daily.     . simvastatin (ZOCOR) 20 MG tablet Take 1 tablet (20 mg total) by mouth daily. 990 tablet 3  . thiamine 100 MG tablet Take 1  tablet (100 mg total) by mouth daily. 30 tablet 1  . timolol (TIMOPTIC) 0.5 % ophthalmic solution Place 1 drop into both eyes 2 (two) times daily.      . traMADol (ULTRAM) 50 MG tablet TAKE 1 TABLET BY MOUTH 3 TIMES A DAY AS NEEDED FOR PAIN 90 tablet 1   No current facility-administered medications on file prior to visit.

## 2015-04-28 NOTE — Telephone Encounter (Signed)
Per SN,  Please call in meclizine 25mg  to pt's pharmacy with instructions to take 1/2-1 tablet by mouth every 4 hours as needed for dizziness Continue ASA 81mg  QD Tell pt if dizziness persists to call office back and a referral for neurology will be placed.  Called pt and informed of the above Pt voiced understanding of rec  Nothing further is needed

## 2015-05-08 DIAGNOSIS — L738 Other specified follicular disorders: Secondary | ICD-10-CM | POA: Diagnosis not present

## 2015-05-08 DIAGNOSIS — L309 Dermatitis, unspecified: Secondary | ICD-10-CM | POA: Diagnosis not present

## 2015-05-08 DIAGNOSIS — Z85828 Personal history of other malignant neoplasm of skin: Secondary | ICD-10-CM | POA: Diagnosis not present

## 2015-05-23 DIAGNOSIS — Z85828 Personal history of other malignant neoplasm of skin: Secondary | ICD-10-CM | POA: Diagnosis not present

## 2015-05-23 DIAGNOSIS — I8312 Varicose veins of left lower extremity with inflammation: Secondary | ICD-10-CM | POA: Diagnosis not present

## 2015-05-23 DIAGNOSIS — I872 Venous insufficiency (chronic) (peripheral): Secondary | ICD-10-CM | POA: Diagnosis not present

## 2015-05-23 DIAGNOSIS — I8311 Varicose veins of right lower extremity with inflammation: Secondary | ICD-10-CM | POA: Diagnosis not present

## 2015-06-08 DIAGNOSIS — Z79899 Other long term (current) drug therapy: Secondary | ICD-10-CM | POA: Diagnosis not present

## 2015-06-12 DIAGNOSIS — H00025 Hordeolum internum left lower eyelid: Secondary | ICD-10-CM | POA: Diagnosis not present

## 2015-06-12 DIAGNOSIS — H4089 Other specified glaucoma: Secondary | ICD-10-CM | POA: Diagnosis not present

## 2015-06-13 DIAGNOSIS — M170 Bilateral primary osteoarthritis of knee: Secondary | ICD-10-CM | POA: Diagnosis not present

## 2015-06-13 DIAGNOSIS — I8311 Varicose veins of right lower extremity with inflammation: Secondary | ICD-10-CM | POA: Diagnosis not present

## 2015-06-13 DIAGNOSIS — I872 Venous insufficiency (chronic) (peripheral): Secondary | ICD-10-CM | POA: Diagnosis not present

## 2015-06-13 DIAGNOSIS — I8312 Varicose veins of left lower extremity with inflammation: Secondary | ICD-10-CM | POA: Diagnosis not present

## 2015-06-13 DIAGNOSIS — M19041 Primary osteoarthritis, right hand: Secondary | ICD-10-CM | POA: Diagnosis not present

## 2015-06-13 DIAGNOSIS — M1A00X0 Idiopathic chronic gout, unspecified site, without tophus (tophi): Secondary | ICD-10-CM | POA: Diagnosis not present

## 2015-06-13 DIAGNOSIS — R600 Localized edema: Secondary | ICD-10-CM | POA: Diagnosis not present

## 2015-06-13 DIAGNOSIS — Z85828 Personal history of other malignant neoplasm of skin: Secondary | ICD-10-CM | POA: Diagnosis not present

## 2015-06-20 DIAGNOSIS — H0016 Chalazion left eye, unspecified eyelid: Secondary | ICD-10-CM | POA: Diagnosis not present

## 2015-06-26 ENCOUNTER — Other Ambulatory Visit: Payer: Self-pay | Admitting: Pulmonary Disease

## 2015-07-03 DIAGNOSIS — M7061 Trochanteric bursitis, right hip: Secondary | ICD-10-CM | POA: Diagnosis not present

## 2015-07-04 DIAGNOSIS — H1851 Endothelial corneal dystrophy: Secondary | ICD-10-CM | POA: Diagnosis not present

## 2015-07-12 DIAGNOSIS — H1851 Endothelial corneal dystrophy: Secondary | ICD-10-CM | POA: Diagnosis not present

## 2015-07-12 DIAGNOSIS — H1830 Unspecified corneal membrane change: Secondary | ICD-10-CM | POA: Diagnosis not present

## 2015-07-15 DIAGNOSIS — H18892 Other specified disorders of cornea, left eye: Secondary | ICD-10-CM | POA: Diagnosis not present

## 2015-07-20 ENCOUNTER — Emergency Department (HOSPITAL_COMMUNITY)
Admission: EM | Admit: 2015-07-20 | Discharge: 2015-07-20 | Disposition: A | Discharge status: Home / Self Care | Payer: Medicare Other | Attending: Emergency Medicine | Admitting: Emergency Medicine

## 2015-07-20 ENCOUNTER — Encounter (HOSPITAL_COMMUNITY): Payer: Self-pay | Admitting: *Deleted

## 2015-07-20 ENCOUNTER — Emergency Department (HOSPITAL_COMMUNITY): Payer: Medicare Other

## 2015-07-20 DIAGNOSIS — K219 Gastro-esophageal reflux disease without esophagitis: Secondary | ICD-10-CM | POA: Diagnosis not present

## 2015-07-20 DIAGNOSIS — Z8709 Personal history of other diseases of the respiratory system: Secondary | ICD-10-CM | POA: Insufficient documentation

## 2015-07-20 DIAGNOSIS — Z85038 Personal history of other malignant neoplasm of large intestine: Secondary | ICD-10-CM | POA: Diagnosis not present

## 2015-07-20 DIAGNOSIS — E7800 Pure hypercholesterolemia, unspecified: Secondary | ICD-10-CM | POA: Diagnosis not present

## 2015-07-20 DIAGNOSIS — N183 Chronic kidney disease, stage 3 (moderate): Secondary | ICD-10-CM | POA: Insufficient documentation

## 2015-07-20 DIAGNOSIS — Z87828 Personal history of other (healed) physical injury and trauma: Secondary | ICD-10-CM | POA: Diagnosis not present

## 2015-07-20 DIAGNOSIS — E039 Hypothyroidism, unspecified: Secondary | ICD-10-CM | POA: Diagnosis not present

## 2015-07-20 DIAGNOSIS — R519 Headache, unspecified: Secondary | ICD-10-CM

## 2015-07-20 DIAGNOSIS — R51 Headache: Secondary | ICD-10-CM | POA: Diagnosis not present

## 2015-07-20 DIAGNOSIS — I129 Hypertensive chronic kidney disease with stage 1 through stage 4 chronic kidney disease, or unspecified chronic kidney disease: Secondary | ICD-10-CM | POA: Insufficient documentation

## 2015-07-20 DIAGNOSIS — H409 Unspecified glaucoma: Secondary | ICD-10-CM | POA: Insufficient documentation

## 2015-07-20 DIAGNOSIS — R531 Weakness: Secondary | ICD-10-CM | POA: Insufficient documentation

## 2015-07-20 DIAGNOSIS — F419 Anxiety disorder, unspecified: Secondary | ICD-10-CM | POA: Diagnosis not present

## 2015-07-20 DIAGNOSIS — Z7982 Long term (current) use of aspirin: Secondary | ICD-10-CM | POA: Diagnosis not present

## 2015-07-20 DIAGNOSIS — Z79899 Other long term (current) drug therapy: Secondary | ICD-10-CM | POA: Insufficient documentation

## 2015-07-20 DIAGNOSIS — Z792 Long term (current) use of antibiotics: Secondary | ICD-10-CM | POA: Diagnosis not present

## 2015-07-20 DIAGNOSIS — R110 Nausea: Secondary | ICD-10-CM | POA: Diagnosis not present

## 2015-07-20 LAB — BASIC METABOLIC PANEL
Anion gap: 11 (ref 5–15)
BUN: 23 mg/dL — ABNORMAL HIGH (ref 6–20)
CO2: 32 mmol/L (ref 22–32)
Calcium: 9.7 mg/dL (ref 8.9–10.3)
Chloride: 94 mmol/L — ABNORMAL LOW (ref 101–111)
Creatinine, Ser: 1.28 mg/dL — ABNORMAL HIGH (ref 0.44–1.00)
GFR calc Af Amer: 39 mL/min — ABNORMAL LOW (ref >60)
GFR, EST NON AFRICAN AMERICAN: 34 mL/min — AB (ref >60)
GLUCOSE: 109 mg/dL — AB (ref 65–99)
POTASSIUM: 3.8 mmol/L (ref 3.5–5.1)
Sodium: 137 mmol/L (ref 135–145)

## 2015-07-20 LAB — CBC
HEMATOCRIT: 42.3 % (ref 36.0–46.0)
HEMOGLOBIN: 13.7 g/dL (ref 12.0–15.0)
MCH: 30.4 pg (ref 26.0–34.0)
MCHC: 32.4 g/dL (ref 30.0–36.0)
MCV: 93.8 fL (ref 78.0–100.0)
Platelets: 213 10*3/uL (ref 150–400)
RBC: 4.51 MIL/uL (ref 3.87–5.11)
RDW: 14.4 % (ref 11.5–15.5)
WBC: 9.2 10*3/uL (ref 4.0–10.5)

## 2015-07-20 MED ORDER — ACETAMINOPHEN 500 MG PO TABS: 500.0000 mg | ORAL_TABLET | ORAL | Status: DC | PRN

## 2015-07-20 MED ORDER — ACETAMINOPHEN 500 MG PO TABS
500.0000 mg | ORAL_TABLET | Freq: Once | ORAL | Status: AC
  Administered 2015-07-20: 500 mg via ORAL
  Filled 2015-07-20: qty 1

## 2015-07-20 NOTE — ED Provider Notes (Signed)
CSN: [id number]     Arrival date & time 07/20/15  1313 History   First MD Initiated Contact with Patient 07/20/15 1457     Chief Complaint  Patient presents with  . Headache     (Consider location/radiation/quality/duration/timing/severity/associated sxs/prior Treatment) HPI A sensation had a severe frontal headache this morning when she woke up. She reports it hurt across her forehead. She reports she had some nausea and dry heaves. She did take Tylenol this morning and now the headache is nearly gone. She had a corneal abrasion on the left thigh about 10 days ago. She's been taking drops and had a patch. She reports is much better now and it was reexamined last Saturday and her doctor told her was nearly healed. There is been no worsening of the eye or other ocular complaints. She reports this morning she felt generally weak. There was no localizing weakness. She describes her gait is being a little bit unsteady for some time now and no changes morning. Past Medical History  Diagnosis Date  . Hypertension   . Hypothyroidism   . Glaucoma   . Acute bronchitis   . Palpitations   . Unspecified venous (peripheral) insufficiency   . Pure hypercholesterolemia   . GERD (gastroesophageal reflux disease)   . Diverticulosis of colon (without mention of hemorrhage)   . Benign neoplasm of colon   . Renal insufficiency   . DJD (degenerative joint disease)   . Vitamin D deficiency disease   . Anxiety state, unspecified   . CKD (chronic kidney disease), stage III 10/06/2013   Past Surgical History  Procedure Laterality Date  . Cataract extraction    . Abdominal hysterectomy    . Breast biopsy      Benign  . Total knee arthroplasty  1999    right  . Total knee arthroplasty  11/2008    left - Dr Lorin Mercy  . Toe amputation  08/2009    Right second toe - Dr Beola Cord  . Ercp N/A 10/06/2013    Procedure: ENDOSCOPIC RETROGRADE CHOLANGIOPANCREATOGRAPHY (ERCP);  Surgeon: Ladene Artist, MD;  Location:  [facility];  Service: Endoscopy;  Laterality: N/A;   Family History  Problem Relation Age of Onset  . Heart disease Mother   . Cancer Father     Throat  . Heart disease Father    Social History  Substance Use Topics  . Smoking status: Never Smoker   . Smokeless tobacco: Never Used  . Alcohol Use: No   OB History    No data available     Review of Systems 10 Systems reviewed and are negative for acute change except as noted in the HPI.    Allergies  Enablex; Clarithromycin; Codeine; Morphine; and Sulfonamide derivatives  Home Medications   Prior to Admission medications   Medication Sig Start Date End Date Taking? Authorizing Provider  acetaminophen (TYLENOL) 325 MG tablet Take 325 mg by mouth every 4 (four) hours as needed for mild pain or moderate pain.     Historical Provider, MD  acetaminophen (TYLENOL) 500 MG tablet Take 1 tablet (500 mg total) by mouth every 4 (four) hours as needed for headache. 07/20/15   Charlesetta Shanks, MD  allopurinol (ZYLOPRIM) 300 MG tablet Take 1 tablet by mouth daily. 01/09/15   Historical Provider, MD  ALPRAZolam Duanne Moron) 0.25 MG tablet TAKE 1/2-1 TABLET THREE TIMES A DAY AS NEEDED 04/03/15   Noralee Space, MD  Ascorbic Acid (VITAMIN C) 500 MG tablet Take 500 mg  by mouth daily.      Historical Provider, MD  aspirin 81 MG tablet Take 81 mg by mouth daily.      Historical Provider, MD  Cholecalciferol (VITAMIN D) 1000 UNITS capsule Take 1,000 Units by mouth daily.      Historical Provider, MD  diltiazem (CARDIZEM CD) 180 MG 24 hr capsule TAKE ONE CAPSULE BY MOUTH EVERY DAY 09/29/14   Noralee Space, MD  feeding supplement, ENSURE COMPLETE, (ENSURE COMPLETE) LIQD Take 237 mLs by mouth 2 (two) times daily between meals. Patient taking differently: Take 237 mLs by mouth daily after lunch.  06/07/14   Shanker Kristeen Mans, MD  ferrous sulfate 325 (65 FE) MG tablet Take 1 tablet (325 mg total) by mouth 2 (two) times daily with a meal. 11/28/14   Hosie Poisson, MD   fluconazole (DIFLUCAN) 100 MG tablet Take 2 tablets by mouth x 1 dose Patient not taking: Reported on 04/05/2015 12/06/14   Noralee Space, MD  folic acid (FOLVITE) 1 MG tablet Take 1 tablet (1 mg total) by mouth daily. 11/28/14   Hosie Poisson, MD  furosemide (LASIX) 40 MG tablet Take 40 mg by mouth daily.    Historical Provider, MD  KLOR-CON M10 10 MEQ tablet Take 1 tablet by mouth 2 (two) times daily. 01/11/15   Historical Provider, MD  levothyroxine (SYNTHROID, LEVOTHROID) 125 MCG tablet TAKE 1/2 TABLET BY MOUTH DAILY BEFORE BREAKFAST 06/26/15   Noralee Space, MD  loperamide (IMODIUM) 2 MG capsule Take 1 capsule (2 mg total) by mouth as needed for diarrhea or loose stools. Patient not taking: Reported on 04/05/2015 11/28/14   Hosie Poisson, MD  loratadine (CLARITIN) 10 MG tablet Take 10 mg by mouth daily as needed for allergies.     Historical Provider, MD  meclizine (ANTIVERT) 25 MG tablet Take 1/2-1 tablet by mouth every 4 hours as needed for dizziness 04/28/15   Noralee Space, MD  Multiple Vitamin (MULTIVITAMIN WITH MINERALS) TABS tablet Take 1 tablet by mouth daily. 11/28/14   Hosie Poisson, MD  mupirocin ointment (BACTROBAN) 2 % 1 APPLICATION APPLY ON THE SKIN AS DIRECTED APPLY TO AFFECTED AREA ON HAND AND COVER AS DIRECTED 02/21/15   Historical Provider, MD  Omega-3 Fatty Acids (FISH OIL) 1000 MG CAPS Take 1 capsule by mouth daily.    Historical Provider, MD  omeprazole (PRILOSEC) 20 MG capsule Take 20 mg by mouth daily.      Historical Provider, MD  Polyethyl Glycol-Propyl Glycol (SYSTANE) 0.4-0.3 % SOLN Place 1 drop into both eyes 4 (four) times daily.     Historical Provider, MD  simvastatin (ZOCOR) 20 MG tablet Take 1 tablet (20 mg total) by mouth daily. 07/05/14   Noralee Space, MD  thiamine 100 MG tablet Take 1 tablet (100 mg total) by mouth daily. 11/28/14   Hosie Poisson, MD  timolol (TIMOPTIC) 0.5 % ophthalmic solution Place 1 drop into both eyes 2 (two) times daily.      Historical Provider, MD   traMADol (ULTRAM) 50 MG tablet TAKE 1 TABLET BY MOUTH 3 TIMES A DAY AS NEEDED FOR PAIN 04/05/15   Noralee Space, MD   BP 146/49 mmHg  Pulse 83  Temp(Src) 99.5 F (37.5 C) (Oral)  SpO2 94% Physical Exam  Constitutional: She is oriented to person, place, and time. She appears well-developed and well-nourished.  Patient is very well-nourished well-developed appearing for age. She appears much younger than stated age.  HENT:  Head: Normocephalic and  atraumatic.  Right Ear: External ear normal.  Left Ear: External ear normal.  Nose: Nose normal.  Mouth/Throat: Oropharynx is clear and moist.  Bilateral TMs normal without erythema or bulging.  Eyes: EOM are normal. Pupils are equal, round, and reactive to light.  Both eyes have symmetric movement. There is some arcus senilis. Very minimal residual conjunctival injection on the left. No periorbital swelling. Both cornea are clear in appearance to examination with handheld magnification.  Neck: Neck supple.  Cardiovascular: Normal rate, regular rhythm, normal heart sounds and intact distal pulses.   Pulmonary/Chest: Effort normal and breath sounds normal.  Abdominal: Soft. Bowel sounds are normal. She exhibits no distension. There is no tenderness.  Musculoskeletal: Normal range of motion. She exhibits no edema.  Neurological: She is alert and oriented to person, place, and time. She has normal strength. No cranial nerve deficit. She exhibits normal muscle tone. Coordination normal. GCS eye subscore is 4. GCS verbal subscore is 5. GCS motor subscore is 6.  Skin: Skin is warm, dry and intact.  Psychiatric: She has a normal mood and affect.    ED Course  Procedures (including critical care time) Labs Review Labs Reviewed  BASIC METABOLIC PANEL - Abnormal; Notable for the following:    Chloride 94 (*)    Glucose, Bld 109 (*)    BUN 23 (*)    Creatinine, Ser 1.28 (*)    GFR calc non Af Amer 34 (*)    GFR calc Af Amer 39 (*)    All other  components within normal limits  CBC    Imaging Review Ct Head Wo Contrast  07/20/2015  CLINICAL DATA:  Acute frontal headache. EXAM: CT HEAD WITHOUT CONTRAST TECHNIQUE: Contiguous axial images were obtained from the base of the skull through the vertex without intravenous contrast. COMPARISON:  CT scan of March 13, 2006. FINDINGS: Bony calvarium appears intact. No mass effect or midline shift is noted. Ventricular size is within normal limits. There is no evidence of mass lesion, hemorrhage or acute infarction. IMPRESSION: Normal head CT. Electronically Signed   By: Marijo Conception, M.D.   On: 07/20/2015 14:53   I have personally reviewed and evaluated these images and lab results as part of my medical decision-making.   EKG Interpretation   Date/Time:  Thursday July 20 2015 14:54:28 EDT Ventricular Rate:  81 PR Interval:  204 QRS Duration: 91 QT Interval:  366 QTC Calculation: 425 R Axis:   -19 Text Interpretation:  Sinus rhythm Confirmed by Johnney Killian, MD, Jeannie Done  [phone]) on 07/20/2015 3:10:11 PM      MDM   Final diagnoses:  Acute nonintractable headache, unspecified headache type   At this time CT head is negative. Patient is very well appearance. She had frontal headaches that was severe in nature but resolved with 325 mg of Tylenol. She does not have ill appearance. She has not had associated constitutional symptoms. There were no localizing neurologic symptoms. I do feel patient is safe for continued home management. Headache may be associated with recent ocular injury. This is nearly healed at this time. I do not appreciate any changes or injection that was suggestive recrudescence of infection or injury. The patient had been wearing a patch earlier, perhaps headache is associated with ocular discomfort from wearing the patch or eyedrops. She will follow-up with her ophthalmologist as planned.    Charlesetta Shanks, MD 07/20/15 [phone]

## 2015-07-20 NOTE — ED Notes (Signed)
Patient states that she woke up this am with left sided headache, rated 10/10 with nausea and fever. Patient reports headache is 8/10 at this time. Patient is being worked up for corneal abrasion to left eye x 10 days. Patient reports generalized fatigue.

## 2015-07-20 NOTE — ED Notes (Signed)
Vallery Ridge, MD informed that the pt rates HA pain 8/10, pt to receive Tylenol 500 mg prior to discharge

## 2015-07-20 NOTE — Discharge Instructions (Signed)

## 2015-07-21 DIAGNOSIS — H1859 Other hereditary corneal dystrophies: Secondary | ICD-10-CM | POA: Diagnosis not present

## 2015-07-21 DIAGNOSIS — H1830 Unspecified corneal membrane change: Secondary | ICD-10-CM | POA: Diagnosis not present

## 2015-07-23 ENCOUNTER — Other Ambulatory Visit: Payer: Self-pay | Admitting: Pulmonary Disease

## 2015-07-31 ENCOUNTER — Other Ambulatory Visit: Payer: Self-pay | Admitting: Pulmonary Disease

## 2015-08-01 NOTE — Telephone Encounter (Signed)
Electronic refill request received for Ultram 50mg  Last OV 04/04/16. Pending OV 08/07/15 Last filled 04/05/15 #90 with 1 refill: Take 1 tablet by mouth 3 times a day as needed for pain.   SN please advise if ok to refill. Thanks!

## 2015-08-02 NOTE — Telephone Encounter (Signed)
Per SN>> Ok to refill with one additional refill  Order called into pt's pharmacy  Nothing further is needed

## 2015-08-07 ENCOUNTER — Ambulatory Visit (INDEPENDENT_AMBULATORY_CARE_PROVIDER_SITE_OTHER): Payer: Medicare Other | Admitting: Pulmonary Disease

## 2015-08-07 ENCOUNTER — Encounter: Payer: Self-pay | Admitting: Pulmonary Disease

## 2015-08-07 VITALS — BP 124/60 | HR 65 | Temp 98.0°F | Wt 133.2 lb

## 2015-08-07 DIAGNOSIS — R609 Edema, unspecified: Secondary | ICD-10-CM | POA: Diagnosis not present

## 2015-08-07 DIAGNOSIS — I359 Nonrheumatic aortic valve disorder, unspecified: Secondary | ICD-10-CM | POA: Diagnosis not present

## 2015-08-07 DIAGNOSIS — N183 Chronic kidney disease, stage 3 unspecified: Secondary | ICD-10-CM

## 2015-08-07 DIAGNOSIS — F411 Generalized anxiety disorder: Secondary | ICD-10-CM

## 2015-08-07 DIAGNOSIS — M159 Polyosteoarthritis, unspecified: Secondary | ICD-10-CM

## 2015-08-07 DIAGNOSIS — I10 Essential (primary) hypertension: Secondary | ICD-10-CM | POA: Diagnosis not present

## 2015-08-07 DIAGNOSIS — I872 Venous insufficiency (chronic) (peripheral): Secondary | ICD-10-CM | POA: Diagnosis not present

## 2015-08-07 DIAGNOSIS — M150 Primary generalized (osteo)arthritis: Secondary | ICD-10-CM

## 2015-08-07 DIAGNOSIS — D638 Anemia in other chronic diseases classified elsewhere: Secondary | ICD-10-CM

## 2015-08-07 DIAGNOSIS — Z23 Encounter for immunization: Secondary | ICD-10-CM

## 2015-08-07 DIAGNOSIS — E039 Hypothyroidism, unspecified: Secondary | ICD-10-CM

## 2015-08-07 NOTE — Progress Notes (Signed)
Subjective:    Patient ID: [name], female    DOB: 01-28-1915, 81 y.o.   MRN: [id number]  HPI 81 y/o WF here for a follow up visit... she has mult med problems including:  Hx asthmatic bronchitis;  HBP;  Ven Insuffic & edema;  Hyperchol;  Hypothyroid;  GERD/ Divertics/ Colon polyps;  Renal insuffic;  DJD;  Vit D defic;  Anxiety...  ~  SEE PREV EPIC NOTES FOR OLDER DATA >>    LABS 11/14:  Chems- ok w/ BS=108, Cr=1.3;  Uric=4.3 on Allopurinol100;  CBC- ok w/ Hg=12.6...  ~  December 15, 2013:  47moROV & [name] w/ RUQ pain & dx w/ gallstones, cholecystitis, & had ERCP w/ sphincterotomy (DrStark) & stone extraction; treated w/ Cipro, LFTs improved, course complicated by LE cellulitis requiring addition of Vanco IV; she had f/u DrCornett, [facility] 2/15> brief note, did not rec elective surg given her age... We reviewed the following medical problems during today's office visit >>     HBP> on Diltiazem180 & Furosemide 465m2Qam w/ BP=118/70 today & she denies CP, palpit, dizzy, ch in SOB, etc...    VI, Hx Edema> she knows to elim sodium, elevate, wear support hose, & the Lasix80; weight is back down 9# today to 137#; treated in hosp 1/15 for cellulitis...    CHOL> on Simva20 Qhs + low fat diet; last FLP 5/13 showed TChol 191, TG 81, HDL 82, LDL 93... Needs to ret fasting for f/u blood work.    Hypothy> on Synthroid 12540m 1/2 tab daily; labs 3/14 showed TSH= 4.28    Renal Insuffic> Creat prev up to 1.8 on the Lasix 16m52m/15 showed Cr betw 1.1-1.7    Derm> she has Psoriasis on her arms & has been evaluated by DrJordan/Jones; they also treated the LE cellulitis; Rec Atarax prn itching...    Anemia> Hosp 1/15 w/ gallstones and cholecystits; Hg= 7.5-9.4 We reviewed prob list, meds, xrays and labs> see below for updates >>   ~  June 14, 2014:  22mo 82mo& [facility] w/ sudden fever to 103, chills, and left leg pain/ redness/  swelling=> c/w severe cellulitis; she was felt to be septic but no lesions on leg, no areas of broken skin (no topical culture material possible) and blood cultures were NEG;  CBC showed WBC up to 29K before improving to 11.7 by disch, mild anemia w/ Hg= 10-11 range, mild renal insuffic w/ BUN=39 & Cr=1.3, MRSA screen NEG, UA was clear, PCT not checked;  MRI of left leg showed diffuse SQ soft tissue swelling & edema, no discrete abscess identified, no osteomyelitis, no signs of septic arthritis, etc;  She was treated w/ empiric Vancomycin & Cefepime- improved clinically but leg remained red/ tender/ swollen copared to the uninvolved right leg;  VenDopplers were neg for DVT;  She was disch on the 7th day w/ PO KEFLEX 500mg 70mx7 more days; she has been elev her leg, wearing support hose, her Lasix dose was cut from 16mgQa34m 40mg/d,35mer meds the same...     Since disch she has just about finished the Keflex but left leg is still markedly swollen vis-a-vie the right leg- and it is red, tender, 2-3+ pitting edema; no areas of broken skin identified; she denies systemic symptoms- no fever now, BP is stable (146/60 today),etc;  I am reluctant to stop her antibiotics at this point w/ persistent  signs & symptoms in her left leg; we will recheck LABS today and refill Keflex500Bid, and refer to ID Team for their consultation...    Other medical problems as noted above... NOTE: Prev CT Abd&Pelvis 1/12 showed bilateral low density adnexal lesions, larger on the left- demonstrated on prior studies (On the right, lesion measures 3.1 cm, on the left, it measures 6.5 x 5.5 cm). No surrounding inflammatory changes are evident. Left-sided lesion has enlarged from pelvic ultrasound done 10/01/2004 at which time it measured 4.8 x 4.4 x 4.5 cm. Bilat renal cysts. Large left perianal lipoma.    We reviewed prob list, meds, xrays and labs> see below for updates >>   CXR 9/15 showed borderline cardiomeg, underlying chr lung dis,  left base atx vs effusion, ?early RUL opac...   EKG 9/15 showed NSR w/ PACs, rate92, NSSTTWA, NAD...  MRI of left leg 9/15> diffuse SQ soft tissue swelling & edema, no discrete abscess identified, no osteomyelitis, no signs of septic arthritis, etc (c/w severe cellulitis).  Ven Dopplers 9/15> no evid of DVT or superficial thrombosis in left leg, enlarged LN's noted in left groin  LABS 9/15 in Hosp> Hg=10-11 range, WBC=29K=>11.7 by disch, BUN/Cr= 39/1.3 improved to 27/0.9 by disch, MRSA=neg, UA=clear...  LABS 9/15 in office f/u>  Chems- ok x Na=131, Cr=1.4;  CBC- ok x   CT Abd&Pelvis 9/15> showed no acute abnormality & no venous obstruction or lymphocele to suggest a cause for left leg swelling; Cholelithiasis; unchanged bilateral adnexal cystic lesions (probably cysts); unchanged renal cysts and left renal atrophy; unchanged left perianal lipoma... PLAN>> we will continue her Keflex500Bid, refer for ID Consult, proceed w/ f/u CT Abd&Pelvis to eval adenopathy & left pelvis...   ~  July 05, 2014:  3wk ROV and [name]'s left leg has improved; she has finished the last round of oral Keflex and her left leg shows decr swelling (she has lost 2#), less red, less tender/ painful (still no sores or drainage), walking better, no f/c/s, etc;  She continues to minimize salt in her diet, elev leg, wear support hose, & has continued her Lasix40-2Qam + K10-2Qam...  She has not yet seen ID consult & she feels this would be a waste of time in light of her clinical improvement...  We decided to follow up on her [facility] (CXR has cleared & back to baseline) & Labs (Cr=1.3, CBC is wnl, Sed improved to 54))... Rec to continue current meds/ continue leg cleansing/ elevation/ no salt/ support hose when up/ etc... ROV recheck in 35mo& sooner if needed for problems...    We reviewed prob list, meds, xrays and labs> see below for updates >>   CXR 10/15 showed clearing of patchy RUL opac & left effusion, lungs now  clear & back to baseline, NAD...  LABS 101/15:  Chems- ok x Na=132, BUN=33, Cr=1.3;  CBC- ok w/ Hg=12.0, WBC=7.6;  Sed=54 (improved from 90)...  ~  October 05, 2014:  368moOV & [name] reports that her left leg has been improved overall- she notes that her leg got sl red & swollen over Christmas, [facility] & improved she says; still taking ASA81, Lasix40-2Qam & K10-2/d... Her CC today id "feeling bad" c/o "throat";  Notes several week hx sore throat, followed by cough, yellow sput, but denies f/c/s, CP, SOB; she took several OTC meds & Robitussin- min improvement;  Exam shows Afeb, VSS, throat is neg, chest has few basilar rhonchi  but no wheezing/ rales/ consolidation... We reviewed the following medical problems during today's office visit >>     HBP> on Diltiazem180, Furosemide 67m-2Qam, & K10-2/d;  BP=132/64 today & she denies CP, palpit, dizzy, ch in SOB, etc...    VI, Hx Edema> she knows to elim sodium, elevate, wear support hose, & the Lasix80; weight is about stable at 134#; prev treated for cellulitis...    CHOL> on Simva20 Qhs + low fat diet; last FLP 5/13 showed TChol 191, TG 81, HDL 82, LDL 93... Needs to ret fasting for f/u blood work.    Hypothy> on Synthroid 1274m- 1/2 tab daily; labs 3/14 showed TSH= 4.28    Renal Insuffic> Creat prev up to 1.8 on the Lasix 8061m1/15 Hosp showed Cr betw 1.1-1.7; last Cr (10/15) = 1.3    Derm> she has Psoriasis on her arms & has been evaluated by DrJordan/Jones; they also treated the LE cellulitis; Rec Atarax prn itching...    Anemia> Hosp 1/15 w/ gallstones and cholecystits (no surg); Hg= 7.5-9.4 at that time; last CBC 10/15 showed Hg= 12.0 We reviewed prob list, meds, xrays and labs> see below for updates >>  PLAN>> We decided to treat w/ Levaquin x5d, Depo80, Pred dosepak; she may also use Mucinex, fluids, & MMW if needed...  ~  December 06, 2014:  70mo17mo & [name]  [facility] w/ right leg cellulitis- states she was out shopping & "over-did it", felt weak & tired then developed right leg discomfort, increased swelling/ redness/ etc; WBC was 21K, VenDopplers were neg for DVT, treated w/ Zosyn/ Vanco & changed to oral Doxy at disch;  She notes that her leg is improved- less swollen, less red, less painful but still sl tender; she notes that right leg first started giving her trouble after the birth of her last child; she had thorough vasc eval by DrEarly in the past and no surgery indicated; she knows to elim salt/sodium, elev legs, wear support hose; she has finished the Doxy100Bid given at disch & she misunderstood the DC directions and has been taking her prev Lasix80mg60m... We reviewed the following medical problems during today's office visit >> NOTE: as an afterthought she noted vag itching & wanted Rx- offered Diflucan orally & if not improved she will need Gyn eval (she has Atarax as well on her med list)...     HBP> on Diltiazem180, Furosemide 40mgQ20m& off KCl since disch;  BP=134/62 today & she denies CP, palpit, dizzy, ch in SOB, etc...    VI, Edema, Cellulitis> she knows to elim sodium, elevate, wear support hose; still taking Lasix80Qam; Hosp 3/16 as above & disch on Doxy- now out & right leg improved she says; RLE chr larger than LLE, sl red/ tender; Rec decr Lasix40.     CHOL> on Simva20 Qhs + low fat diet; last FLP 5/13 showed TChol 191, TG 81, HDL 82, LDL 93... Needs to ret fasting for f/u blood work.    Hypothy> on Synthroid 125mcg-97m tab daily; labs 3/16 showed TSH= 1.21    Renal Insuffic> Creat prev up to 1.8 on the Lasix 80mg; C24m 3/16= 1.52=>1.12    Derm> she has Psoriasis on her arms & has been evaluated by DrJordan/Jones; they also treated the LE cellulitis; Rec Atarax prn itching...    Anemia> Hosp 1/15 w/ gallstones and cholecystits (no surg); Hg= 7.5-9.4 at that time & Hg improved to 12.7; labs 3/16 showed  Hg= 13.2=>10.6, Fe=13 (7%sat),  Ferritin=104, on FeSO4... We reviewed prob list, meds, xrays and labs> see below for updates >>   CXR 3/16 showed norm heart size, mild left base atx, otherw OK...   EKG 3/16 showed NSR, rate86, PACs, poor r prog V1-2, NAD.Marland KitchenMarland Kitchen   Ven Dopplers 3/16 were neg for DVT...   2DEcho 3/16 showed mild LVH w/ mild focal basal septal hypertrophy, norm LVF w/ EF=60-65%, norm wall motion, Gr1DD, norm AoV, mild MR, mild LA dil, norm RV, PAsys=38...  LABS 3.16:  Chems- ok x BUN=37 due to Lasix80 she was on & we decr her to 36m/d; CBC- wnl,  Sed=65;  PCT<0.10 indicating +inflamm but no infection now...  PLAN>> we decided to check f/u labs (BMet, CBC, Sed, PCT); Rec to apply cool compresses to right leg several times daily, elim salt/sodium, elev legs, wear support hose; recurrent cellulitis episodes may require suppressive antibiotic therapy... Labs ret w/ BUN=37 7 we decr her Lasix from 80=>40/d; elev Sed w/ neg PCT & we are holding any further antibiotics for now...  ~  April 05, 2015:  42moOV & Kayson's CC is orthopedic w/ hip and back pain & she tells me that DrNewton has diagnosed Gout, Rx Allopurinol300- we do not have notes from him; Recall prev evals from Rheum- DrDeveshwar, Dx w/ Gout tophi (on Allopurinol), severe OA in hands and hips, s/p bilat TKRs- on Allopurinol, Tramadol (refilled), Tylenol... We reviewed the following medical problems during today's office visit >>     HBP> on Diltiazem180, Furosemide 4073mm, & KCl Bid;  BP=120/70 today & she denies CP, palpit, dizzy, ch in SOB, etc...    VI, Edema, Cellulitis> she knows to elim sodium, elevate, wear support hose; still taking Lasix40Qam; Hosp 3/16 as above & disch on Doxy- right leg improved she says; RLE chr larger than LLE, sl red/ tender; Rec continue Lasix40.     CHOL> on Simva20 Qhs + low fat diet; last FLP 5/13 showed TChol 191, TG 81, HDL 82, LDL 93... Needs to ret fasting for f/u blood work.    Hypothy> on Synthroid 125m32m1/2 tab  daily; labs 3/16 showed TSH= 1.21    Renal Insuffic> Creat prev up to 1.8 on Lasix 80mg93meat 3/16= 1.52=>1.12; Labs 7/16 stable w/ BUN=35, Cr=1.13    Hx severe DJD, Gout w/ tophi, s/p bilat TKRs> prev followed by DrDeveshwar; on Allopurinol, Tramadol, Tylenol...    Derm> she has Psoriasis on her arms & has been evaluated by DrJordan/Jones; they also treated the LE cellulitis; Rec Atarax prn itching...    Anemia> Hosp 1/15 w/ gallstones and cholecystits (no surg); Hg= 7.5-9.4 at that time & Hg improved to 12.7; labs 7/16 showed Hg= 12.1, Fe=102 (32%sat), Ferritin=116, on FeSO4/d... We reviewed prob list, meds, xrays and labs> see below for updates >>   LABS 7/16:  Chems- ok w/ BUN=35, Cr=1.13, BS=100, BNP=268;  CBC- ok w/ Hg=12.1, Fe=102 (32%), Ferritin=116...  ~  August 07, 2015:  76mo R83mo post-ER follow up visit> [facility]/o HA rated 8/10> frontal HA, assoc n/v, felt weak & unsteady; BP was ok, VS were stable, Labs ok, CT Head was neg- wnl; Tylenol helped... we reviewed the following medical problems during today's office visit >>     HBP> on Diltiazem180, Furosemide 40mgQa38m KCl Bid;  BP=124/60 today & she denies CP, palpit, dizzy, ch in SOB, etc...    VI, Edema, Hx cellulitis> she knows to elim sodium,  elevate, wear support hose; on Lasix40Qam; [facility] 3[facility] & disch on Doxy- right leg improved; RLE chr larger than LLE, sl red/ tender; Rec continue Lasix40.     CHOL> on Simva20 Qhs + low fat diet; last FLP 5/13 showed TChol 191, TG 81, HDL 82, LDL 93... Needs to ret fasting for f/u blood work.    Hypothy> on Synthroid 158mg- 1/2 tab daily; labs 3/16 showed TSH= 1.21    Renal Insuffic> Creat prev up to 1.8 on Lasix 812m Creat 3/16= 1.52=>1.12; Labs 7/16 stable w/ BUN=35, Cr=1.13    Hx severe DJD, Gout w/ tophi, s/p bilat TKRs> prev followed by DrDeveshwar; on Allopurinol, Tramadol, Tylenol...    Derm> she has Psoriasis on her arms & has been evaluated by DrJordan/Jones;  they also treated the LE cellulitis; Rec Atarax prn itching...    Anemia> Hosp 1/15 w/ gallstones and cholecystits (no surg); Hg= 7.5-9.4 at that time & Hg improved to 12.7; labs 7/16 showed Hg= 12.1, Fe=102 (32%sat), Ferritin=116, on FeSO4/d...    Anxiety>  On Xanax 0.25- 1/2 to 1 tab tid prn... EXAM shows Afeb, VSS, O2sat=96% on RA:  Heent- neg, mallampati1;  Chest- clear w/o w/r/r;  Heart- RR gr1/6SEM w/o r/g;  Abd- soft, nontender, neg;  Ext- VI, 1+edema, ecchymoses;  Neuro- no focal neuro deficits...   CT Head 07/20/15>  Intact, WNL, NAD...  Labs 06/2015>  Chems- wnl;  CBC- wnl... IMP/PLAN>>  OK Flu vaccine today;  Continue same meds....            Problem List:  GLAUCOMA (ICD-365.9) - hx mult eye problems w/ prev corneal transplant & glaucoma laser eye surg... [facility]  ASTHMATIC BRONCHITIS, ACUTE (ICD-466.0) - Hx AB requiring Rx w/ Depo/ Pred/ Mucinex/ Tussionex... ~  CXR 4/11 showed vague nodular densities on left... ~  [facility]  [facility], [facility], & dx w/ pneumonia rx w/ ZPak... ~  CXR 3/12 showed borderline cardiomeg, nodular opac left base, NAD... ~  Spring 2013: she notes some allergy symptoms... ~  CXR 1/15 showed mild cardiomeg, atherosclerotic & tortuous Ao, low lung vol & sl incr markings, NAD...[name] ~  CXR 9/15 showed borderline cardiomeg, underlying chr lung dis, left base atx vs effusion, ?early RUL opac... ~  CXR 10/15 showed clearing of patchy RUL opac & left effusion, lungs now clear & back to baseline, NAD ~  CXR 3/16 in hosp showed norm heart size, mild left base atx, otherw OK...   HYPERTENSION (ICD-401.9) - controlled on ASA 8176m,  CARTIAXT 180m83m LASIX 40mg8m-2Qam... ~  7/10:  labs showed BUN=54, Creat=2.0 & rec to decr diuretics in half= one Demedex & 1/2 Aldactone. ~  11/10:  labs showed BUN=38, Creat=1.8 & rec to keep same meds. ~  8/11:  labs showed BUN=42, Creat=2.1, K=4.3 ~  1/12:  labs in ER  showed BUN=56, Creat=2.67, therefore diuretics decr to Demadex20/d only. ~  3/12:  2DEcho showed mod LVH, norm sys function w/ EF=60-65% & no regional wall motion abn; Gr1DD, mibile density on AoV- prob lambl'e escrescence, mildMR. ~  5/12:  f/u labs in office showed BUN=45, Creat=1.8, on Lasix 80mg 54mfor her edema... ~  9/12:  [facility]33, Creat=1.47, edema resolved therefore decr Lasix to 40mg/d6m 1/13:  BP= 160/76 but hasn't taken meds today; labs improved w/ BUN=26, Creat=1.3, BNP=50 ~  5/13:  BP= 140/78 & denies CP,  palpit, SOB, edema; labs improved w/ BUN=28, Creat=1.2 ~  9/13:  BP= 140/60 & she denies CP, palpt, ch in SOB or edema... ~  12/13: controlled on Diltiazem180 & Furosemide 57m-2Qam w/ BP=138/62 today & she denies CP, palpit, dizzy, ch inSOB, etc... ~  3/14:  BP= 130/66 on same meds; Labs stable as well w/ Cr=1.5, continue same... ~  7/14: on Diltiazem180 & Furosemide 449m2Qam w/ BP=150/64 today & she denies CP, palpit, dizzy, ch in SOB, etc. ~  11/14: BP= 126/64 on same meds, stable... ~  EKG 1/15 showed NSR, rate92, NSSTTWA, NAD... ~  3/15: on Diltiazem180 & Furosemide 4063mQam w/ BP=118/70 today & she denies CP, palpit, dizzy, ch in SOB, etc. ~  9/15: post hosp> off Diltiazem now & still on Furosemide 91m31m-2Qam & K10-2/d w/ BP=146/60 w/ leg swelling, edema, cellulitis as below... ~  1/16: on Diltiazem180, Furosemide 91mg62mm, & K10-2/d;  BP=132/64 today & she denies CP, palpit, dizzy, ch in SOB, etc. ~  3/16: on Diltiazem180, Furosemide 91mgQ3m& off KCl since disch;  BP=134/62 today & she denies CP, palpit, dizzy, ch in SOB, etc ~  7/16: on Diltiazem180, Furosemide 91mgQa76m KCl Bid;  BP=120/70 & she remains asymptomatic...  PALPITATIONS, HX OF (ICD-V12.50) ~  3/16:  EKG showed NSR, rate86, PACs, poor r prog V1-2, NAD...  VENOUS INSUFFICIENCY/ EDEMA >> swelling controlled w/ diuretics, low sodium diet, elevation, support hose,  etc... RIGHT LEG SWELLING > LEFT LEG... Pt notes it's been like this since her last child was born... Hx RECURRENT CELLULITIS >>  ~  She saw DrEarly [facility]- there was nothing he could do... ~  Swelling diminished w/ low sodium, elevation, support hose, & Lasix40 1-2tabs daily... ~  Nov-Dec2013: she knows to elim sodium, elevate, wear support hose; she has been seen by [facility], DrEarly & there is nothing they can do; recently checked by Derm- DPayton Mccallumes w/ patch dressing & support hose; edema decr w/ Lasix 80mg/d;33m today shows Creat=1.5... ~  7/[name]4:  she knows to elim sodium, elevate, wear support hose, & the Lasix80; weight is down 10# today to 141# w/ resolution of edema; [name] (Derm) treated dermatitis w/ new cream & improved. ~  11/14: wt is back up 8# to 149# today & she needs to elim sodium, elev legs, etc... ~  3/15: she knows to elim sodium, elevate, wear support hose, & the Lasix80; weight is back down 9# today to 137#... ~  9/[name]15: [facility] w/ 4+edema etc; VenDopplers neg for DVT; no open wounds/ drainage; treated w/ IV antibiotics and disch on Keflex; has persistent erythema, pain, swelling & we decided to continue the Keflex & refer to ID... ~  CT Abd&Pelvis 9/15> showed no acute abnormality & no venous obstruction or lymphocele to suggest a cause for left leg swelling; Cholelithiasis; unchanged bilateral adnexal cystic lesions (probably cysts); unchanged renal cysts and left renal atrophy; unchanged left perianal lipoma... ~  10/15: she is improved after the extended course of Keflex w/ decr erythema, swelling & discomfort; she did not see ID; plan is to continue same meds, cleansing, elevation, no salt, support hose... ~  3/16: she was Adm w/ right leg cellulitis- states she was out shopping & "over-did it", felt weak & tired then developed right leg discomfort, increased swelling/ redness/ etc; WBC was 21K, VenDopplers were neg for DVT, treated w/ Zosyn/ Vanco &  changed to oral Doxy at disch;  She notes that her leg  is improved- less swollen, less red, less painful but still sl tender;  HYPERCHOLESTEROLEMIA (ICD-272.0) - prev Rx'd by DrGegick... Now on ZOCOR 47m/d ([facility] & [facility]  [facility]off med due to $$$) w/ TChol 285, TG 204, HDL 69, LDL 195... "he was mad at me" ~  [facility] T[facility]ol 202, TG 60, HDL 112, LDL 99 ~  She is reminded (again) to come FASTING for f/u FLP... ~  [place] Sim[place]20 showed TChol 191, TG 81, HDL 82, LDL 93 ~  She is reminded to ret FASTING for f/u blood work...  HYPOTHYROIDISM (ICD-244.9) - prev followed by DrGegick on SYNTHROID 1269m- 1/2 daily... ~  [facility] w/ TSH= 2.90, FreeT4= 1.09 ~  labs 8/11 showed TSH= 1.87 ~  Labs 3/12 showed TSH= 2.81 ~  Labs 1/13 showed TSH= 1.65 ~  Labs 3/14 showed TSH= 4.28 ~  Labs 3/16 showed TSH= 1.21  GERD (ICD-530.81) - uses PRILOSEC 2038m... last EGD was 6/06 by DrPatterson showing 3cmHH, reflux...   DIVERTICULOSIS OF COLON (ICD-562.10) & COLONIC POLYPS (ICD-211.3) - last colonoscopy 6/06 was WNL- no abnormality seen...  GALLSTONE and CHOLECYSTITIS >>  ~  [facility] w/ RUQ pain & dx w/ gallstones, cholecystitis, & had ERCP w/ sphincterotomy (DrStark) & stone extraction; treated w/ Cipro, LFTs improved, course complicated by LE cellulitis requiring addition of Vanco IV; she had f/u DrCornett, CCS 2/15> brief note, did not rec elective surg given her age. ~  CT Abd&Pelvis 9/15> showed no acute abnormality & no venous obstruction or lymphocele to suggest a cause for left leg swelling; Cholelithiasis; unchanged bilateral adnexal cystic lesions (probably cysts); unchanged renal cysts and left renal atrophy; unchanged left perianal lipoma  RENAL INSUFFICIENCY (ICD-588.9) - tough balance betw renal perfusion & diuresis for edema... ~  labs 10/08 showed BUN= 27, Creat= 1.5 ~   labs 8/09 showed BUN= 44, Creat= 1.9 ~  labs 2/10 showed BUN 46, Creat= 1.7 ~  labs 7/10 showed BUN= 54, Creat= 2.0 ~  labs 11/10 showed BUN= 38, Creat= 1.8 ~  labs 8/11 showed BUN= 42, Creat= 2.1 ~  labs in ER 1/12 showed BUN=56, Creat=2.67, rec decr diuretics to Demadex20m54monly. ~  Labs 3/12 showed improved renal function w/ BUN=31, Creat=1.4... ~  Labs 5/12 on Lasix80 showed BUN=45, Creat=1.8 ~  Labs 9/12 by DrDeveshwar showed BUN=33, Creat=1.47 ~  Labs 1/13 showed BUN=26, Creat=1.3, BNP=50... On Lasix 40mg35m. ~  Labs 5/13 showed BUN=28, Creat=1.2 ~  Labs 12/13 on Lasix80 showed BUN=30, Creat=1.5 ~  Labs 3/14 on Lasix80 showed BUN=31, Creat=1.5 ~  Labs 6/14 by DrDeveshwar on Lasix80 showed BUN=30, Cr=1.3 ~  Labs here 11/14 on Lasix80 showed BUN=31, Cr= 1.3 ~  She was [facility] w/ cholecystitis & stones, s/p ERCP, and Creat- 1.1 - 1.7 ~  Labs 9/15 showed Cr= 0.94 to 1.4 range... ~  Labs 10/15 showed Cr= 1.3-1.4 ~  Labs 3/16 showed Cr= 1.52=>1.12 ~  Labs 7/16 showed BUN= 35, Cr= 1.13  DEGENERATIVE JOINT DISEASE (ICD-715.90) - this is her chief complaint- s/p right TKR in 1999,  left TKR 3/10... on MOBIC 7.5mg P51m& VICODIN Prn as well... followed by DrYates; and had right second toe amp by DrBednArnette Norrisfor hammertoe + spurs... also [facility] (without benefit she says)... ~  5/13:  She saw DrDeveshwar for  Rheum w/ shot in hip & sl improved... 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 rec- start Vit D 50,000 u weekly... ~  labs 2/10 showed Vit D level = 30... rec- continue Vit D 50K weekly... ~  labs 7/10 showed Vit D level = 39... rec- change to 1000 u OTC daily. ~  labs 8/11 showed Vit D level = 38... continue same. ~  Labs 3/14 showed Vit D level = 43... Continue 1000u daily.  ANXIETY (ICD-300.00) - on ALPRAZOLAM 0.61mTid  Prn... several deaths in the family... daughter who lives w/ her recently ill...  SHINGLES - she had right T9-10 shingles in 2011 & resolved w/ Valtrex, Pred, etc...   Past Surgical History  Procedure Laterality Date  . Cataract extraction    . Abdominal hysterectomy    . Breast biopsy      Benign  . Total knee arthroplasty  1999    right  . Total knee arthroplasty  11/2008    left - Dr YLorin Mercy . Toe amputation  08/2009    Right second toe - Dr BBeola Cord . Ercp N/A 10/06/2013    Procedure: ENDOSCOPIC RETROGRADE CHOLANGIOPANCREATOGRAPHY (ERCP);  Surgeon: MLadene Artist MD;  [facility] [facility]  Service: Endoscopy;  Laterality: N/A;    Outpatient Encounter Prescriptions as of 08/07/2015  Medication Sig  . acetaminophen (TYLENOL) 325 MG tablet Take 325 mg by mouth every 4 (four) hours as needed for mild pain or moderate pain.   .Marland Kitchenacetaminophen (TYLENOL) 500 MG tablet Take 1 tablet (500 mg total) by mouth every 4 (four) hours as needed for headache.  . allopurinol (ZYLOPRIM) 300 MG tablet Take 1 tablet by mouth daily.  .Marland KitchenALPRAZolam (XANAX) 0.25 MG tablet TAKE 1/2-1 TABLET THREE TIMES A DAY AS NEEDED  . Ascorbic Acid (VITAMIN C) 500 MG tablet Take 500 mg by mouth daily.    .Marland Kitchenaspirin 81 MG tablet Take 81 mg by mouth daily.    . Cholecalciferol (VITAMIN D) 1000 UNITS capsule Take 1,000 Units by mouth daily.    .Marland Kitchendiltiazem (CARDIZEM CD) 180 MG 24 hr capsule TAKE ONE CAPSULE BY MOUTH EVERY DAY  . erythromycin ophthalmic ointment Use small amount left eye twice a day for 10 days.  Discontinue if allergy symptoms develop and call our office.  . feeding supplement, ENSURE COMPLETE, (ENSURE COMPLETE) LIQD Take 237 mLs by mouth 2 (two) times daily between meals. (Patient taking differently: Take 237 mLs by mouth daily after lunch. )  . ferrous sulfate 325 (65 FE) MG tablet Take 1 tablet (325 mg total) by mouth 2 (two) times daily with a meal.  . folic acid (FOLVITE) 1 MG tablet Take 1 tablet (1 mg  total) by mouth daily.  . furosemide (LASIX) 40 MG tablet Take 40 mg by mouth daily.  .Marland KitchenKLOR-CON M10 10 MEQ tablet Take 1 tablet by mouth 2 (two) times daily.  .Marland Kitchenlevothyroxine (SYNTHROID, LEVOTHROID) 125 MCG tablet TAKE 1/2 TABLET BY MOUTH DAILY BEFORE BREAKFAST  . loratadine (CLARITIN) 10 MG tablet Take 10 mg by mouth daily as needed for allergies.   . Multiple Vitamin (MULTIVITAMIN WITH MINERALS) TABS tablet Take 1 tablet by mouth daily.  . mupirocin ointment (BACTROBAN) 2 % 1 APPLICATION APPLY ON THE SKIN AS DIRECTED APPLY TO AFFECTED AREA ON HAND AND COVER AS DIRECTED  .  Omega-3 Fatty Acids (FISH OIL) 1000 MG CAPS Take 1 capsule by mouth daily.  [name] omeprazole (PRILOSEC) 20 MG capsule Take 20 mg by mouth daily.    . simvastatin (ZOCOR) 20 MG tablet TAKE 1 TABLET (20 MG TOTAL) BY MOUTH DAILY.  . sodium chloride (MURO 128) 5 % ophthalmic solution Administer 1 drop into the left eye Four (4) times a day.  . thiamine 100 MG tablet Take 1 tablet (100 mg total) by mouth daily.  . timolol (TIMOPTIC) 0.5 % ophthalmic solution Place 1 drop into both eyes 2 (two) times daily.    . traMADol (ULTRAM) 50 MG tablet AKE 1 TABLET BY MOUTH 3 TIMES A DAY AS NEEDED FOR PAIN.  . fluconazole (DIFLUCAN) 100 MG tablet Take 2 tablets by mouth x 1 dose (Patient not taking: Reported on 04/05/2015)  . loperamide (IMODIUM) 2 MG capsule Take 1 capsule (2 mg total) by mouth as needed for diarrhea or loose stools. (Patient not taking: Reported on 04/05/2015)  . meclizine (ANTIVERT) 25 MG tablet Take 1/2-1 tablet by mouth every 4 hours as needed for dizziness (Patient not taking: Reported on 08/07/2015)  . Polyethyl Glycol-Propyl Glycol (SYSTANE) 0.4-0.3 % SOLN Place 1 drop into both eyes 4 (four) times daily.    No facility-administered encounter medications on file as of 08/07/2015.    Allergies  Allergen Reactions  . Enablex [Darifenacin Hydrobromide Er] Other (See Comments)    Caused her throat and mouth to have bumps  and feel like it was swelling  . Clarithromycin Swelling    REACTION: causes her mouth to swell  . Codeine Nausea Only  . Morphine Nausea And Vomiting  . Sulfonamide Derivatives Other (See Comments)    REACTION: unsure of reaction    Current Medications, Allergies, Past Medical History, Past Surgical History, Family History, and Social History were reviewed in [employer] record.    Review of Systems        See HPI - all other systems neg except as noted...  The patient complains of dyspnea on exertion, peripheral edema, muscle weakness, and difficulty walking.  The patient denies anorexia, fever, weight loss, weight gain, vision loss, decreased hearing, hoarseness, chest pain, syncope, prolonged cough, headaches, hemoptysis, abdominal pain, melena, hematochezia, severe indigestion/heartburn, hematuria, incontinence, genital sores, suspicious skin lesions, transient blindness, depression, unusual weight change, abnormal bleeding, enlarged lymph nodes, and angioedema.     Objective:   Physical Exam      WD, WN, 81 y/o WF in NAD... GENERAL:  Alert & oriented; pleasant & cooperative... HEENT:  [place]/AT, EOM- full, EACs-clear, TMs-wnl, NOSE-clear, THROAT-clear & wnl. NECK:  Supple w/ fairROM; no JVD; normal carotid impulses w/o bruits; no thyromegaly or nodules palpated; no lymphadenopathy. CHEST:  Clear, decr BS bilat w/o wheezing, rales, or rhonchi heard... HEART:  Regular Rhythm;  gr 1/6 SEM without rubs or gallops detected... ABDOMEN:  Soft & nontender; normal bowel sounds; no organomegaly or masses detected. EXT:  moderate arthritic changes; s/p bilat TKRs; mild varicose veins/ +venous insuffic/ 2+edema Left leg, sl red/ sl tender... s/p right second toe distal amputation... NEURO:  CN's intact;  no focal neuro deficits... DERM:  No lesions noted; no rash etc...  RADIOLOGY DATA:  Reviewed in the EPIC EMR & discussed w/ the patient...  LABORATORY DATA:  Reviewed  in the EPIC EMR & discussed w/ the patient...   Assessment & Plan:    Hx Left LEG CELLULITIS, now Right LEG CELLULITIS>> ?Etiology (NOS), no broken skin  or draining areas; treated empirically in [facility] 1/15 & disch on Keflex w/ persistent red/ swollen/ tender left leg; we continued the Keflex for 2wks longer & wanted ID consult; she never got the consult but improved on the Keflex; now off the antibiotic w/ decr swelling, erythema, discomfort; REC to continue meds, elevation, no salt, support hose, etc...  12/15> she reports left leg red, incr swelling, & drainage- went to Derm, DrJordan & given cream which she reports has helped... 3/16> she was hosp w/ right leg cellulitis- swollen, red, inflammed, tender- but again NOS, no broken skin, etc; treated w/ Zosyn/ Vanco & disch on Doxy=> improved... 7/16> stable w/o recurrent soft tissue infection 11//16> at baseline...   URI/ Bronchitis> she presented 1/16 w/ sore throat, cough, yellow sput & we decided to treat w/ Levaquin x5d, Depo80, Pred dosepak; she may also use Mucinex, fluids, & MMW if needed...  HBP>  Controlled on meds, continue same including the Lasix40Qam...  Cardiac>  Prev DrRoss' note reviewed; see 2DEcho, Event Monitor reports no dangerous arrhythmias, continue meds...  Ven Insuffic/ EDEMA- improved>  evals by Cards & VVS> "there is nothing they can do"; continue elevation, no salt, Lasix80 now, compression...  CHOL>  On Simva20 & FLP 5/13 looked good...  HYPOTHYROID>  Continue Synthroid 159mg/d taking 1/2 tab daily...  GI>  Stable now s/p ERCP for gallstones; followed by DrStark now... Episode of cholecystitis, gallstones 1/15- s/p ERCP & improved w/o surg (DrStark, DrCornett)...  Renal Insuffic>  Careful w/ diuresis, NSAIDs etc; Creat varied 1.1 to 1.7  DJD>  She saw DrDeveshwar for her Rheum complaints==> shot in knee; DrYates/ NErnestina Patcheshave signed off; may need pain clinic....  Other problems as noted...   Patient's  Medications  New Prescriptions   No medications on file  Previous Medications   ACETAMINOPHEN (TYLENOL) 325 MG TABLET    Take 325 mg by mouth every 4 (four) hours as needed for mild pain or moderate pain.    ACETAMINOPHEN (TYLENOL) 500 MG TABLET    Take 1 tablet (500 mg total) by mouth every 4 (four) hours as needed for headache.   ALLOPURINOL (ZYLOPRIM) 300 MG TABLET    Take 1 tablet by mouth daily.   ALPRAZOLAM (XANAX) 0.25 MG TABLET    TAKE 1/2-1 TABLET THREE TIMES A DAY AS NEEDED   ASCORBIC ACID (VITAMIN C) 500 MG TABLET    Take 500 mg by mouth daily.     ASPIRIN 81 MG TABLET    Take 81 mg by mouth daily.     CHOLECALCIFEROL (VITAMIN D) 1000 UNITS CAPSULE    Take 1,000 Units by mouth daily.     DILTIAZEM (CARDIZEM CD) 180 MG 24 HR CAPSULE    TAKE ONE CAPSULE BY MOUTH EVERY DAY   ERYTHROMYCIN OPHTHALMIC OINTMENT    Use small amount left eye twice a day for 10 days.  Discontinue if allergy symptoms develop and call our office.   FEEDING SUPPLEMENT, ENSURE COMPLETE, (ENSURE COMPLETE) LIQD    Take 237 mLs by mouth 2 (two) times daily between meals.   FERROUS SULFATE 325 (65 FE) MG TABLET    Take 1 tablet (325 mg total) by mouth 2 (two) times daily with a meal.   FLUCONAZOLE (DIFLUCAN) 100 MG TABLET    Take 2 tablets by mouth x 1 dose   FOLIC ACID (FOLVITE) 1 MG TABLET    Take 1 tablet (1 mg total) by mouth daily.   FUROSEMIDE (LASIX) 40 MG TABLET  Take 40 mg by mouth daily.   KLOR-CON M10 10 MEQ TABLET    Take 1 tablet by mouth 2 (two) times daily.   LEVOTHYROXINE (SYNTHROID, LEVOTHROID) 125 MCG TABLET    TAKE 1/2 TABLET BY MOUTH DAILY BEFORE BREAKFAST   LOPERAMIDE (IMODIUM) 2 MG CAPSULE    Take 1 capsule (2 mg total) by mouth as needed for diarrhea or loose stools.   LORATADINE (CLARITIN) 10 MG TABLET    Take 10 mg by mouth daily as needed for allergies.    MECLIZINE (ANTIVERT) 25 MG TABLET    Take 1/2-1 tablet by mouth every 4 hours as needed for dizziness   MULTIPLE VITAMIN (MULTIVITAMIN  WITH MINERALS) TABS TABLET    Take 1 tablet by mouth daily.   MUPIROCIN OINTMENT (BACTROBAN) 2 %    1 APPLICATION APPLY ON THE SKIN AS DIRECTED APPLY TO AFFECTED AREA ON HAND AND COVER AS DIRECTED   OMEGA-3 FATTY ACIDS (FISH OIL) 1000 MG CAPS    Take 1 capsule by mouth daily.   OMEPRAZOLE (PRILOSEC) 20 MG CAPSULE    Take 20 mg by mouth daily.     POLYETHYL GLYCOL-PROPYL GLYCOL (SYSTANE) 0.4-0.3 % SOLN    Place 1 drop into both eyes 4 (four) times daily.    SIMVASTATIN (ZOCOR) 20 MG TABLET    TAKE 1 TABLET (20 MG TOTAL) BY MOUTH DAILY.   SODIUM CHLORIDE (MURO 128) 5 % OPHTHALMIC SOLUTION    Administer 1 drop into the left eye Four (4) times a day.   THIAMINE 100 MG TABLET    Take 1 tablet (100 mg total) by mouth daily.   TIMOLOL (TIMOPTIC) 0.5 % OPHTHALMIC SOLUTION    Place 1 drop into both eyes 2 (two) times daily.     TRAMADOL (ULTRAM) 50 MG TABLET    AKE 1 TABLET BY MOUTH 3 TIMES A DAY AS NEEDED FOR PAIN.  Modified Medications   No medications on file  Discontinued Medications   No medications on file

## 2015-08-07 NOTE — Patient Instructions (Signed)
Today we updated your med list in our EPIC system...    Continue your current medications the same...  Today we gave you the 2016 flu vaccine...  Happy holidays to you & your family...  Call for any questions...  Let's plan a follow up visit in 71mo, sooner if needed for problems.Marland KitchenMarland Kitchen

## 2015-08-14 DIAGNOSIS — D692 Other nonthrombocytopenic purpura: Secondary | ICD-10-CM | POA: Diagnosis not present

## 2015-08-14 DIAGNOSIS — I8312 Varicose veins of left lower extremity with inflammation: Secondary | ICD-10-CM | POA: Diagnosis not present

## 2015-08-14 DIAGNOSIS — Z85828 Personal history of other malignant neoplasm of skin: Secondary | ICD-10-CM | POA: Diagnosis not present

## 2015-08-14 DIAGNOSIS — I8311 Varicose veins of right lower extremity with inflammation: Secondary | ICD-10-CM | POA: Diagnosis not present

## 2015-08-14 DIAGNOSIS — I872 Venous insufficiency (chronic) (peripheral): Secondary | ICD-10-CM | POA: Diagnosis not present

## 2015-08-21 ENCOUNTER — Other Ambulatory Visit: Payer: Self-pay | Admitting: Pulmonary Disease

## 2015-08-22 DIAGNOSIS — H0016 Chalazion left eye, unspecified eyelid: Secondary | ICD-10-CM | POA: Diagnosis not present

## 2015-08-22 DIAGNOSIS — H1851 Endothelial corneal dystrophy: Secondary | ICD-10-CM | POA: Diagnosis not present

## 2015-08-22 DIAGNOSIS — H1859 Other hereditary corneal dystrophies: Secondary | ICD-10-CM | POA: Diagnosis not present

## 2015-09-20 ENCOUNTER — Encounter: Payer: Self-pay | Admitting: *Deleted

## 2015-10-10 ENCOUNTER — Other Ambulatory Visit: Payer: Self-pay | Admitting: Pulmonary Disease

## 2015-10-13 DIAGNOSIS — M2550 Pain in unspecified joint: Secondary | ICD-10-CM | POA: Diagnosis not present

## 2015-10-19 ENCOUNTER — Other Ambulatory Visit: Payer: Self-pay | Admitting: *Deleted

## 2015-10-19 MED ORDER — POTASSIUM CHLORIDE CRYS ER 10 MEQ PO TBCR
EXTENDED_RELEASE_TABLET | ORAL | Status: DC
Start: 2015-10-19 — End: 2016-07-15

## 2015-10-19 MED ORDER — LEVOTHYROXINE SODIUM 125 MCG PO TABS
ORAL_TABLET | ORAL | Status: DC
Start: 2015-10-19 — End: 2016-04-03

## 2015-11-17 DIAGNOSIS — I8312 Varicose veins of left lower extremity with inflammation: Secondary | ICD-10-CM | POA: Diagnosis not present

## 2015-11-17 DIAGNOSIS — L720 Epidermal cyst: Secondary | ICD-10-CM | POA: Diagnosis not present

## 2015-11-17 DIAGNOSIS — Z85828 Personal history of other malignant neoplasm of skin: Secondary | ICD-10-CM | POA: Diagnosis not present

## 2015-11-17 DIAGNOSIS — L245 Irritant contact dermatitis due to other chemical products: Secondary | ICD-10-CM | POA: Diagnosis not present

## 2015-11-17 DIAGNOSIS — I872 Venous insufficiency (chronic) (peripheral): Secondary | ICD-10-CM | POA: Diagnosis not present

## 2015-11-17 DIAGNOSIS — D692 Other nonthrombocytopenic purpura: Secondary | ICD-10-CM | POA: Diagnosis not present

## 2015-11-17 DIAGNOSIS — I8311 Varicose veins of right lower extremity with inflammation: Secondary | ICD-10-CM | POA: Diagnosis not present

## 2015-11-19 ENCOUNTER — Other Ambulatory Visit: Payer: Self-pay | Admitting: Pulmonary Disease

## 2015-11-23 NOTE — Telephone Encounter (Signed)
Please advise if okay to refill- Last given # 90 tablets on 04/03/15 with 5 rf  Last ov 08/07/15 Next ov 12/11/15

## 2015-12-07 ENCOUNTER — Telehealth: Payer: Self-pay | Admitting: Pulmonary Disease

## 2015-12-07 MED ORDER — ONDANSETRON HCL 8 MG PO TABS: 8.0000 mg | ORAL_TABLET | Freq: Four times a day (QID) | ORAL | Status: AC | PRN

## 2015-12-07 NOTE — Telephone Encounter (Signed)
Per Dr. Lenna Gilford:  Zofran 8mg  1 every 6 hours PRN for nausea Clear liquid diet Be sure she is keeping her regular medications down  If not better, go to ER -----------------------------------------------------  Pt's daughter notified of Dr. Jeannine Kitten recommendations. Rx sent to pharmacy. Patient's daughter given ER precautions. Nothing further needed.

## 2015-12-07 NOTE — Telephone Encounter (Signed)
Patient is having nausea, no cough, blood pressure is high 161/74; few minutes later, 167/71, heart is pounding, unable to sleep.  No chest pain, no chest tightness, just pounding. Daughter calling to see if she can get an appointment today.   Dr. Lenna Gilford, please advise.

## 2015-12-08 NOTE — Telephone Encounter (Signed)
Rx telephoned to [facility]

## 2015-12-11 ENCOUNTER — Ambulatory Visit: Payer: PRIVATE HEALTH INSURANCE | Admitting: Pulmonary Disease

## 2015-12-11 DIAGNOSIS — M160 Bilateral primary osteoarthritis of hip: Secondary | ICD-10-CM | POA: Diagnosis not present

## 2015-12-11 DIAGNOSIS — M1A00X0 Idiopathic chronic gout, unspecified site, without tophus (tophi): Secondary | ICD-10-CM | POA: Diagnosis not present

## 2015-12-11 DIAGNOSIS — M19271 Secondary osteoarthritis, right ankle and foot: Secondary | ICD-10-CM | POA: Diagnosis not present

## 2015-12-11 DIAGNOSIS — N189 Chronic kidney disease, unspecified: Secondary | ICD-10-CM | POA: Diagnosis not present

## 2015-12-25 DIAGNOSIS — M7061 Trochanteric bursitis, right hip: Secondary | ICD-10-CM | POA: Diagnosis not present

## 2015-12-28 ENCOUNTER — Encounter: Payer: Self-pay | Admitting: Pulmonary Disease

## 2016-01-15 ENCOUNTER — Other Ambulatory Visit: Payer: Self-pay | Admitting: Pulmonary Disease

## 2016-01-17 DIAGNOSIS — I8312 Varicose veins of left lower extremity with inflammation: Secondary | ICD-10-CM | POA: Diagnosis not present

## 2016-01-17 DIAGNOSIS — I872 Venous insufficiency (chronic) (peripheral): Secondary | ICD-10-CM | POA: Diagnosis not present

## 2016-01-17 DIAGNOSIS — I8311 Varicose veins of right lower extremity with inflammation: Secondary | ICD-10-CM | POA: Diagnosis not present

## 2016-01-17 DIAGNOSIS — D692 Other nonthrombocytopenic purpura: Secondary | ICD-10-CM | POA: Diagnosis not present

## 2016-01-17 DIAGNOSIS — Z85828 Personal history of other malignant neoplasm of skin: Secondary | ICD-10-CM | POA: Diagnosis not present

## 2016-01-21 ENCOUNTER — Telehealth: Payer: Self-pay | Admitting: Pulmonary Disease

## 2016-01-22 NOTE — Telephone Encounter (Signed)
Refill request for Tramadol Last refilled: 08/02/15 Last OV: 08/07/15 Next OV: 02/28/16  Dr. Lenna Gilford, ok to refill?

## 2016-01-23 NOTE — Telephone Encounter (Signed)
[facility] called [facility]. Nothing further needed.

## 2016-01-23 NOTE — Telephone Encounter (Signed)
Per SN: Ok to refill Tramadol 50mg , #90 1 po q8h prn for pain. Thanks.

## 2016-02-06 ENCOUNTER — Ambulatory Visit: Payer: Medicare Other | Admitting: Pulmonary Disease

## 2016-02-07 DIAGNOSIS — H401122 Primary open-angle glaucoma, left eye, moderate stage: Secondary | ICD-10-CM | POA: Diagnosis not present

## 2016-02-13 ENCOUNTER — Ambulatory Visit: Payer: Medicare Other | Admitting: Pulmonary Disease

## 2016-02-16 ENCOUNTER — Encounter: Payer: Self-pay | Admitting: Internal Medicine

## 2016-02-16 ENCOUNTER — Ambulatory Visit (INDEPENDENT_AMBULATORY_CARE_PROVIDER_SITE_OTHER): Payer: Medicare Other | Admitting: Internal Medicine

## 2016-02-16 ENCOUNTER — Other Ambulatory Visit (INDEPENDENT_AMBULATORY_CARE_PROVIDER_SITE_OTHER): Payer: Medicare Other

## 2016-02-16 VITALS — BP 158/62 | HR 108 | Ht 63.0 in | Wt 137.6 lb

## 2016-02-16 DIAGNOSIS — L0390 Cellulitis, unspecified: Secondary | ICD-10-CM

## 2016-02-16 DIAGNOSIS — L03115 Cellulitis of right lower limb: Secondary | ICD-10-CM | POA: Diagnosis not present

## 2016-02-16 LAB — CBC WITH DIFFERENTIAL/PLATELET
BASOS ABS: 0 10*3/uL (ref 0.0–0.1)
Basophils Relative: 0.2 % (ref 0.0–3.0)
EOS ABS: 0.1 10*3/uL (ref 0.0–0.7)
Eosinophils Relative: 0.7 % (ref 0.0–5.0)
HEMATOCRIT: 36.4 % (ref 36.0–46.0)
Hemoglobin: 11.8 g/dL — ABNORMAL LOW (ref 12.0–15.0)
LYMPHS ABS: 2.2 10*3/uL (ref 0.7–4.0)
LYMPHS PCT: 26.2 % (ref 12.0–46.0)
MCHC: 32.4 g/dL (ref 30.0–36.0)
MCV: 93.4 fl (ref 78.0–100.0)
Monocytes Absolute: 0.6 10*3/uL (ref 0.1–1.0)
Monocytes Relative: 6.7 % (ref 3.0–12.0)
NEUTROS PCT: 66.2 % (ref 43.0–77.0)
Neutro Abs: 5.6 10*3/uL (ref 1.4–7.7)
PLATELETS: 247 10*3/uL (ref 150.0–400.0)
RBC: 3.9 Mil/uL (ref 3.87–5.11)
RDW: 17.6 % — ABNORMAL HIGH (ref 11.5–15.5)
WBC: 8.4 10*3/uL (ref 4.0–10.5)

## 2016-02-16 LAB — BASIC METABOLIC PANEL
BUN: 25 mg/dL — ABNORMAL HIGH (ref 6–23)
CHLORIDE: 101 meq/L (ref 96–112)
CO2: 26 mEq/L (ref 19–32)
Calcium: 9.7 mg/dL (ref 8.4–10.5)
Creatinine, Ser: 1.33 mg/dL — ABNORMAL HIGH (ref 0.40–1.20)
GFR: 39.07 mL/min — ABNORMAL LOW (ref >60.00)
Glucose, Bld: 97 mg/dL (ref 70–99)
POTASSIUM: 4.4 meq/L (ref 3.5–5.1)
Sodium: 134 mEq/L — ABNORMAL LOW (ref 135–145)

## 2016-02-16 LAB — SEDIMENTATION RATE: Sed Rate: 44 mm/hr — ABNORMAL HIGH (ref 0–30)

## 2016-02-16 MED ORDER — CEPHALEXIN 500 MG PO CAPS
500.0000 mg | ORAL_CAPSULE | Freq: Four times a day (QID) | ORAL | Status: DC
Start: 2016-02-16 — End: 2016-02-20

## 2016-02-16 NOTE — Progress Notes (Signed)
Subjective:     Patient ID: [name], female   DOB: [birth year], 82 y.o.   MRN: [id number]  HPI  28 yowf pt of [facility] with h/o chronic venous stasis with recurrent cellulitis in past    02/16/2016 acute extended ov/Gilma Bessette re: ? Flare of cellulitis Chief Complaint  Patient presents with  . Acute Visit    Pt c/o increased swelling in her legs over the past wk. She c/o pain in her left foot.    says similar to prev flares resp to keflex s calf pain/ sob/ fever, cp Seeing derm and creams applied not helping   No obvious day to day or daytime variability or assoc chronic cough or cp or chest tightness, subjective wheeze or overt sinus or hb symptoms. No unusual exp hx or h/o childhood pna/ asthma or knowledge of premature birth.  Sleeping ok without nocturnal  or early am exacerbation  of respiratory  c/o's or need for noct saba. Also denies any obvious fluctuation of symptoms with weather or environmental changes or other aggravating or alleviating factors except as outlined above   Current Medications, Allergies, Complete Past Medical History, Past Surgical History, Family History, and Social History were reviewed in [employer] record.  ROS  The following are not active complaints unless bolded sore throat, dysphagia, dental problems, itching, sneezing,  nasal congestion or excess/ purulent secretions, ear ache,   fever, chills, sweats, unintended wt loss, classically pleuritic or exertional cp, hemoptysis,  orthopnea pnd or leg swelling, presyncope, palpitations, abdominal pain, anorexia, nausea, vomiting, diarrhea  or change in bowel or bladder habits, change in stools or urine, dysuria,hematuria,  rash, arthralgias, visual complaints, headache, numbness, weakness or ataxia or problems with walking or coordination,  change in mood/affect or memory.            Review of Systems     Objective:   Physical Exam Elderly wf w/c bound nad  Wt Readings from  Last 3 Encounters:  02/16/16 137 lb 9.6 oz (62.415 kg)  08/07/15 133 lb 3.2 oz (60.419 kg)  04/05/15 143 lb 12.8 oz (65.227 kg)    Vital signs reviewed   HEENT: nl dentition, turbinates, and oropharynx. Nl external ear canals without cough reflex   NECK :  without JVD/Nodes/TM/ nl carotid upstrokes bilaterally   LUNGS: no acc muscle use,  Nl contour chest which is clear to A and P bilaterally without cough on insp or exp maneuvers   CV:  RRR  no s3 or murmur or increase in P2, no edema   ABD:  soft and nontender with nl inspiratory excursion in the supine position. No bruits or organomegaly, bowel sounds nl  MS:  Nl gait/ ext warm without deformities, calf tenderness, cyanosis or clubbing No obvious joint restrictions   SKIN: both le sym swelling/ redness / calor   NEURO:  alert, approp, nl sensorium with  no motor deficits    Labs ordered/ reviewed:      Chemistry      Component Value Date/Time   NA 134* 02/16/2016 1154   K 4.4 02/16/2016 1154   CL 101 02/16/2016 1154   CO2 26 02/16/2016 1154   BUN 25* 02/16/2016 1154   CREATININE 1.33* 02/16/2016 1154      Component Value Date/Time   CALCIUM 9.7 02/16/2016 1154   ALKPHOS 49 11/25/2014 0305   AST 17 11/25/2014 0305   ALT 9 11/25/2014 0305   BILITOT 0.7 11/25/2014 0305  Lab Results  Component Value Date   WBC 8.4 02/16/2016   HGB 11.8* 02/16/2016   HCT 36.4 02/16/2016   MCV 93.4 02/16/2016   PLT 247.0 02/16/2016               Lab Results  Component Value Date   ESRSEDRATE 44* 02/16/2016   ESRSEDRATE 65* 12/06/2014   ESRSEDRATE 54* 07/05/2014          Assessment:

## 2016-02-16 NOTE — Patient Instructions (Addendum)
Elevate the legs as much as you can  Keflex 500 mg twice daily x 2 weeks > if condition worsens I would rec you go to ER   Please remember to go to the lab   department downstairs for your tests - we will call you with the results when they are available.    Keep appt with Dr Lenna Gilford

## 2016-02-18 ENCOUNTER — Encounter: Payer: Self-pay | Admitting: Internal Medicine

## 2016-02-18 NOTE — Assessment & Plan Note (Signed)
Recurrent in pt with clear evidence of chronic venous stasis and nothing obvious to suggest new dx like dvt but very hard to exclude at the same time  rec trial of elevation/ keflex 500 bid x 2 weeks and f/u then by Dr Lenna Gilford  Discussed in detail all the  indications, usual  risks and alternatives  relative to the benefits with patient who agrees to proceed with conservative f/u as outlined   I had an extended discussion with the patient reviewing all relevant studies completed to date and  lasting 15 to 20 minutes of a 25 minute visit    Each maintenance medication was reviewed in detail including most importantly the difference between maintenance and prns and under what circumstances the prns are to be triggered using an action plan format that is not reflected in the computer generated alphabetically organized AVS.    Please see instructions for details which were reviewed in writing and the patient given a copy highlighting the part that I personally wrote and discussed at today's ov.

## 2016-02-20 ENCOUNTER — Telehealth: Payer: Self-pay | Admitting: Pulmonary Disease

## 2016-02-20 MED ORDER — CEPHALEXIN 500 MG PO CAPS: 500.0000 mg | ORAL_CAPSULE | Freq: Two times a day (BID) | ORAL | Status: DC

## 2016-02-20 NOTE — Progress Notes (Signed)
Quick Note:  LMTCB ______ 

## 2016-02-20 NOTE — Telephone Encounter (Signed)
Seen 5.26.17 by MW for acute visit: Patient Instructions       Elevate the legs as much as you can  Keflex 500 mg twice daily x 2 weeks > if condition worsens I would rec you go to ER   Please remember to go to the lab   department downstairs for your tests - we will call you with the results when they are available.     Keep appt with Dr Estanislado Spire [facility] [employer] and spoke with pharmacist Lanelle Bal who reported the Rx sent was for Keflex 500mg  QID #[id number] Verified in MW's note that pt is to take it BID x2 weeks:  Cellulitis - Tanda Rockers, MD at 02/18/2016 3:33 PM     Status: Written Related Problem: Cellulitis   Expand All Collapse All   Recurrent in pt with clear evidence of chronic venous stasis and nothing obvious to suggest new dx like dvt but very hard to exclude at the same time  rec trial of elevation/ keflex 500 bid x 2 weeks and f/u then by Dr Lenna Gilford  Discussed in detail all the indications, usual risks and alternatives relative to the benefits with patient who agrees to proceed with conservative f/u as outlined   I had an extended discussion with the patient reviewing all relevant studies completed to date and lasting 15 to 20 minutes of a 25 minute visit   Each maintenance medication was reviewed in detail including most importantly the difference between maintenance and prns and under what circumstances the prns are to be triggered using an action plan format that is not reflected in the computer generated alphabetically organized AVS.   Please see instructions for details which were reviewed in writing and the patient given a copy highlighting the part that I personally wrote and discussed at today's ov.        Lanelle Bal voiced her understanding. Med list corrected Nothing further needed; will sign off

## 2016-02-21 DIAGNOSIS — I8312 Varicose veins of left lower extremity with inflammation: Secondary | ICD-10-CM | POA: Diagnosis not present

## 2016-02-21 DIAGNOSIS — Z85828 Personal history of other malignant neoplasm of skin: Secondary | ICD-10-CM | POA: Diagnosis not present

## 2016-02-21 DIAGNOSIS — I8311 Varicose veins of right lower extremity with inflammation: Secondary | ICD-10-CM | POA: Diagnosis not present

## 2016-02-21 DIAGNOSIS — I872 Venous insufficiency (chronic) (peripheral): Secondary | ICD-10-CM | POA: Diagnosis not present

## 2016-02-22 NOTE — Progress Notes (Signed)
Quick Note:  Spoke with pt and notified of results per Dr. Wert. Pt verbalized understanding and denied any questions.  ______ 

## 2016-02-26 DIAGNOSIS — I8312 Varicose veins of left lower extremity with inflammation: Secondary | ICD-10-CM | POA: Diagnosis not present

## 2016-02-26 DIAGNOSIS — Z85828 Personal history of other malignant neoplasm of skin: Secondary | ICD-10-CM | POA: Diagnosis not present

## 2016-02-26 DIAGNOSIS — I8311 Varicose veins of right lower extremity with inflammation: Secondary | ICD-10-CM | POA: Diagnosis not present

## 2016-02-26 DIAGNOSIS — I872 Venous insufficiency (chronic) (peripheral): Secondary | ICD-10-CM | POA: Diagnosis not present

## 2016-02-28 ENCOUNTER — Encounter: Payer: Self-pay | Admitting: Pulmonary Disease

## 2016-02-28 ENCOUNTER — Ambulatory Visit (INDEPENDENT_AMBULATORY_CARE_PROVIDER_SITE_OTHER): Payer: Medicare Other | Admitting: Pulmonary Disease

## 2016-02-28 VITALS — BP 120/60 | HR 106 | Temp 98.0°F | Ht 63.0 in | Wt 144.2 lb

## 2016-02-28 DIAGNOSIS — R609 Edema, unspecified: Secondary | ICD-10-CM | POA: Diagnosis not present

## 2016-02-28 DIAGNOSIS — I872 Venous insufficiency (chronic) (peripheral): Secondary | ICD-10-CM

## 2016-02-28 DIAGNOSIS — I8311 Varicose veins of right lower extremity with inflammation: Secondary | ICD-10-CM

## 2016-02-28 DIAGNOSIS — I359 Nonrheumatic aortic valve disorder, unspecified: Secondary | ICD-10-CM

## 2016-02-28 DIAGNOSIS — N183 Chronic kidney disease, stage 3 unspecified: Secondary | ICD-10-CM

## 2016-02-28 DIAGNOSIS — I10 Essential (primary) hypertension: Secondary | ICD-10-CM

## 2016-02-28 DIAGNOSIS — I8312 Varicose veins of left lower extremity with inflammation: Secondary | ICD-10-CM

## 2016-02-28 MED ORDER — PREDNISONE 10 MG PO TABS: 10.0000 mg | ORAL_TABLET | Freq: Every day | ORAL | Status: DC

## 2016-02-28 MED ORDER — HYDROXYZINE HCL 25 MG PO TABS: 25.0000 mg | ORAL_TABLET | Freq: Four times a day (QID) | ORAL | Status: AC | PRN

## 2016-02-28 MED ORDER — FUROSEMIDE 40 MG PO TABS: ORAL_TABLET | ORAL | Status: DC

## 2016-02-28 NOTE — Patient Instructions (Signed)
Today we updated your med list in our EPIC system...     We decided to INCREASE your LASIX (Furosemide) 40mg  tabs to 2 tabs each AM...  We started PREDNISONE 10mg  one tab each AM...  We wrote for an itching pill--- ATARAX (Hydroxyzine) 25mg  take 1/2 to 1 tab every 6H as needed for itching...  Continue the treatment program from your Dermatologist...  llet's plan an ROV in 2 weeks to recheck & adjust your meds as indicated.Marland KitchenMarland Kitchen

## 2016-03-02 IMAGING — MR MR [PERSON_NAME] LOW WO/W CM*L*
6 of 10 series · 22 of 40 positions shown · IV contrast (Y)
Comparison: None.

CLINICAL DATA: Pain and swelling.

EXAM:
MRI OF LOWER LEFT EXTREMITY WITHOUT AND WITH CONTRAST
TECHNIQUE: Multiplanar, multisequence MR imaging of the lower left extremity
was performed both before and after administration of intravenous
contrast.
CONTRAST:  14mL MULTIHANCE GADOBENATE DIMEGLUMINE 529 MG/ML IV SOLN

[Series 4: T1 · coronal · 4.0mm · 0.70mm/px · 1 of 22 slices shown]
[im 1/22]
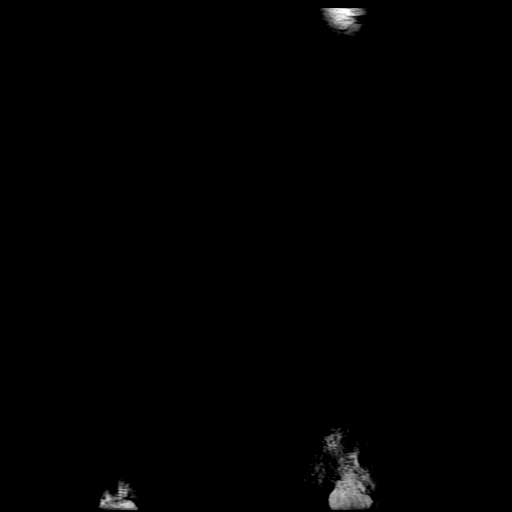

[Series 5: cor fse ir · coronal · 4.0mm · 0.70mm/px · 2 of 22 slices shown]
[im 1/22]
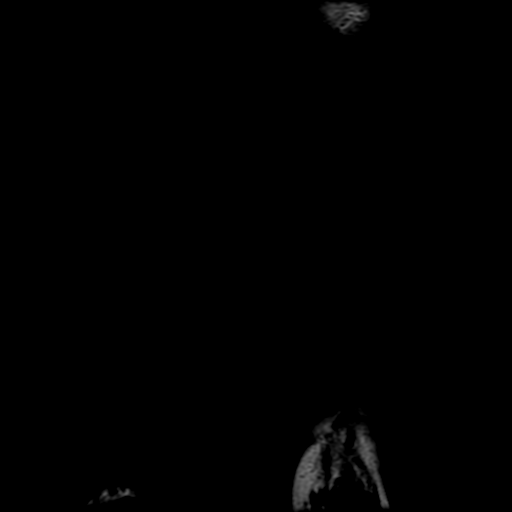
[im 22/22]
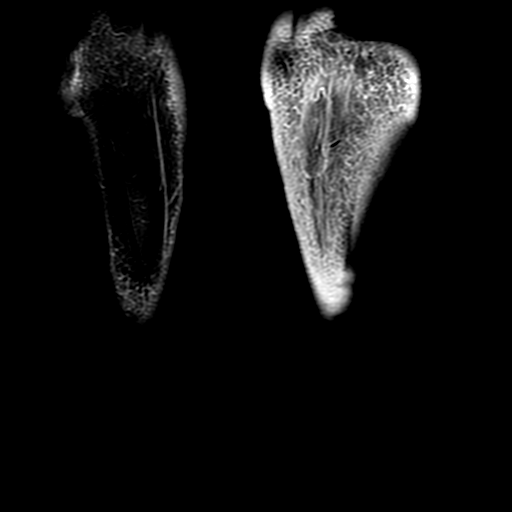

[Series 6: T2 fat-sat · axial · 5.0mm · 0.59mm/px · z∈[-167,+191]mm · 6 of 56 slices shown]
[im 1/56]
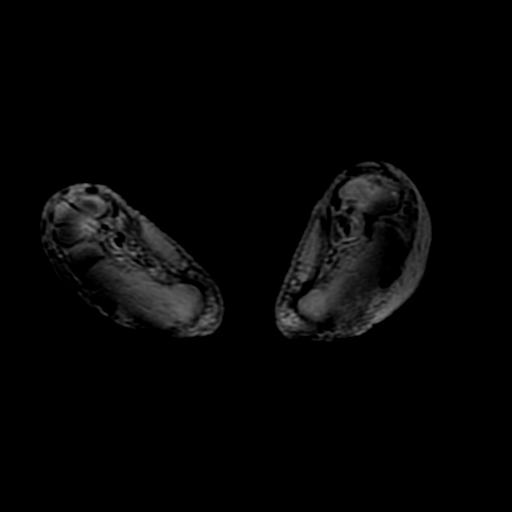
[im 12/56]
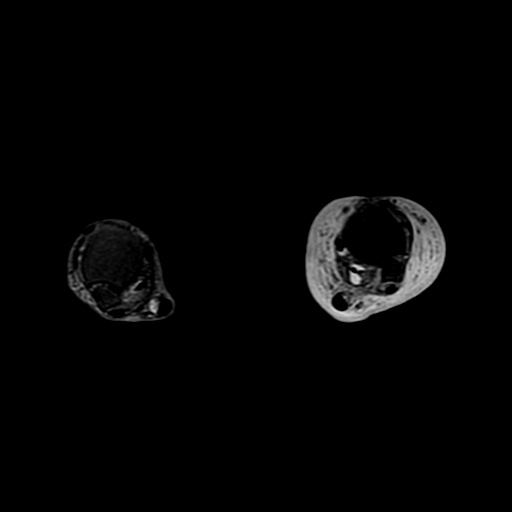
[im 23/56]
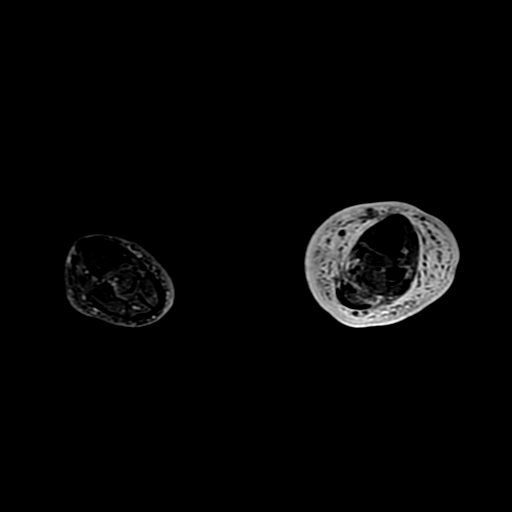
[im 34/56]
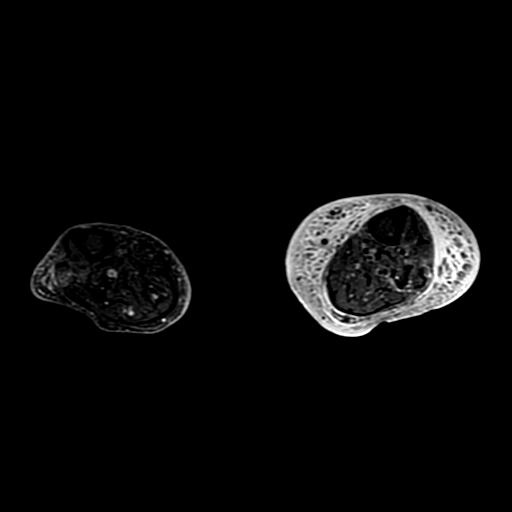
[im 45/56]
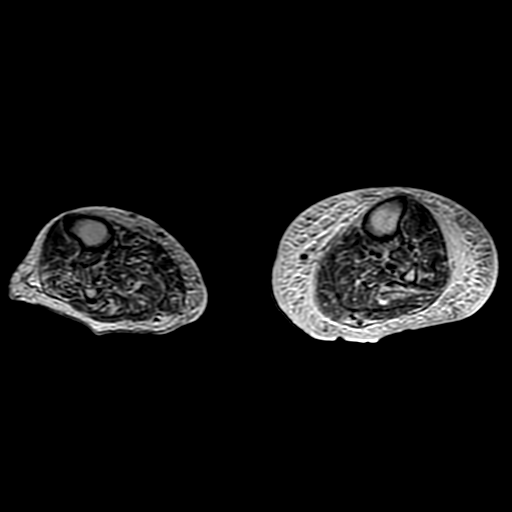
[im 56/56]
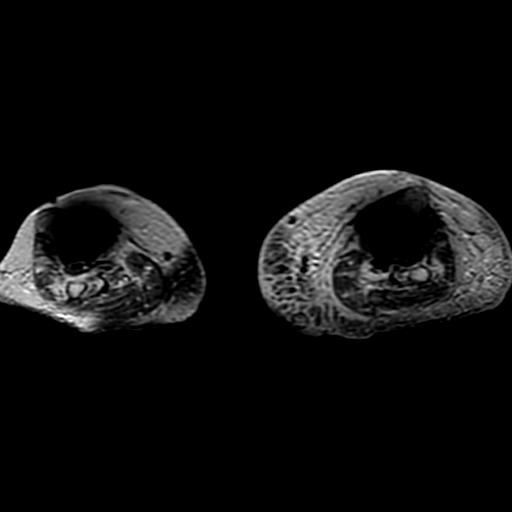

[Series 7: DIXON · axial · 5.0mm · 0.59mm/px · z∈[-167,+191]mm · 6 of 56 slices shown (1 of 2)]
[im 1/56]
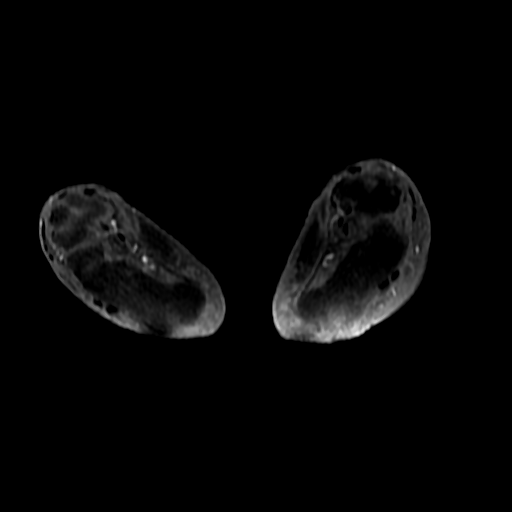
[im 12/56]
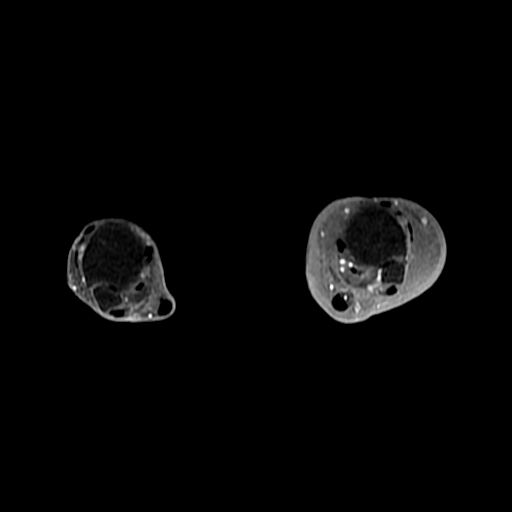
[im 23/56]
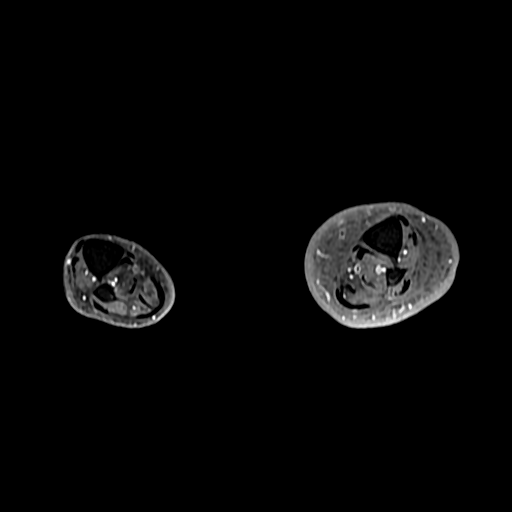
[im 34/56]
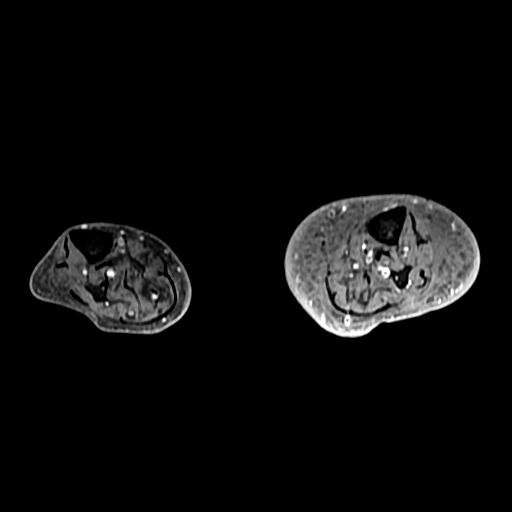
[im 45/56]
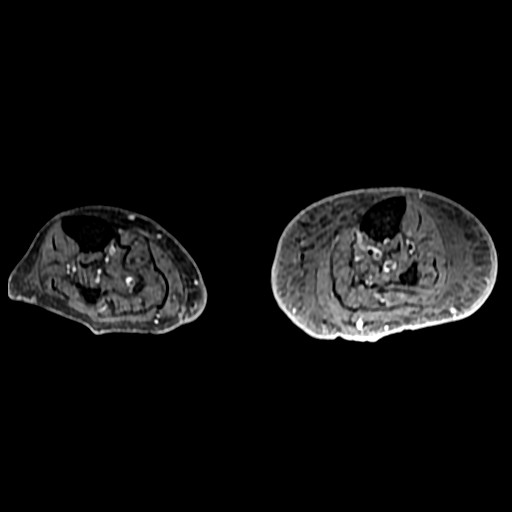
[im 56/56]
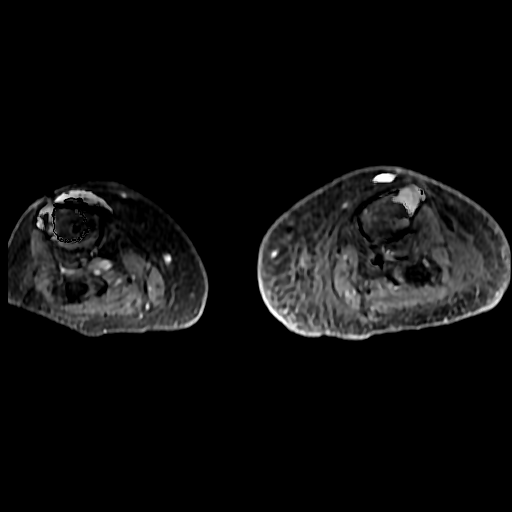

[Series 8: sag fse ir · sagittal · 4.0mm · 1.41mm/px · 3 of 26 slices shown]
[im 1/26]
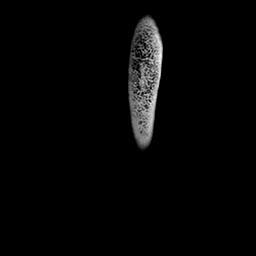
[im 13/26]
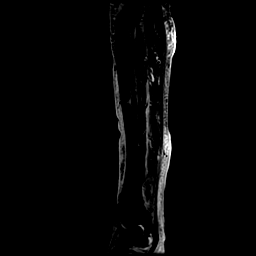
[im 26/26]
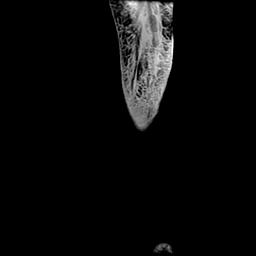

[Series 9: DIXON · axial · 5.0mm · 0.59mm/px · z∈[-167,+48]mm · 4 of 56 slices shown (2 of 2)]
[im 1/56]
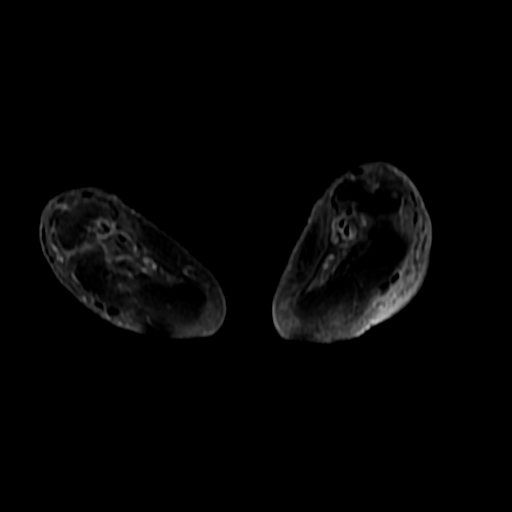
[im 12/56]
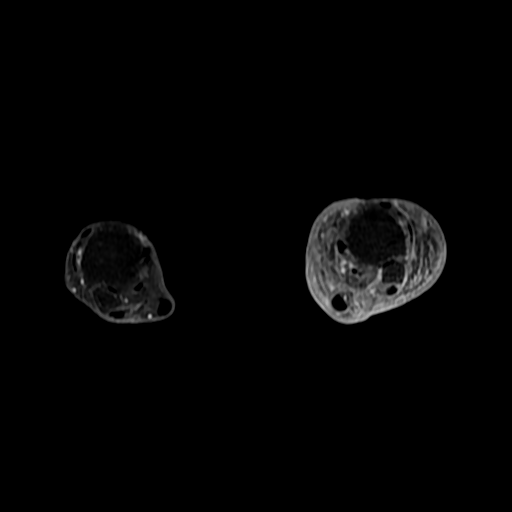
[im 23/56]
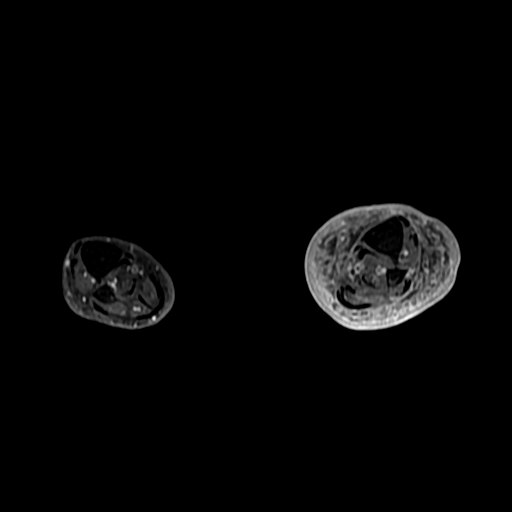
[im 34/56]
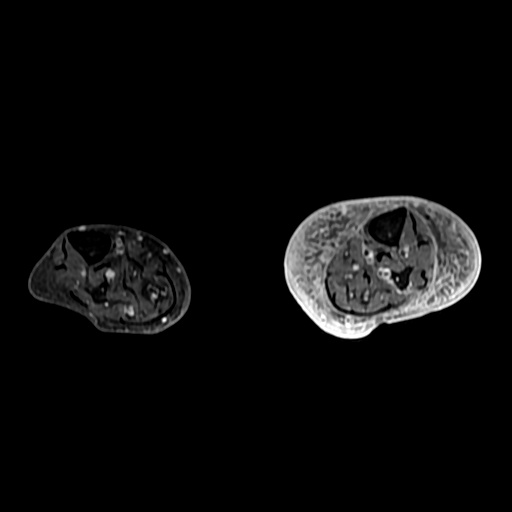

[22 of 40 positions shown; findings below may reference images not displayed]

FINDINGS: There is diffuse subcutaneous soft tissue swelling/ edema and fluid
involving the entire left lower extremity below the knee. No
discrete drainable abscess is identified. No findings for septic
arthritis or osteomyelitis. No findings for myofasciitis.
IMPRESSION: Severe cellulitis but no findings for focal soft tissue abscess,
septic arthritis or osteomyelitis.

## 2016-03-04 DIAGNOSIS — I8312 Varicose veins of left lower extremity with inflammation: Secondary | ICD-10-CM | POA: Diagnosis not present

## 2016-03-04 DIAGNOSIS — I872 Venous insufficiency (chronic) (peripheral): Secondary | ICD-10-CM | POA: Diagnosis not present

## 2016-03-04 DIAGNOSIS — Z85828 Personal history of other malignant neoplasm of skin: Secondary | ICD-10-CM | POA: Diagnosis not present

## 2016-03-04 DIAGNOSIS — I8311 Varicose veins of right lower extremity with inflammation: Secondary | ICD-10-CM | POA: Diagnosis not present

## 2016-03-08 DIAGNOSIS — J209 Acute bronchitis, unspecified: Secondary | ICD-10-CM | POA: Diagnosis not present

## 2016-03-10 DIAGNOSIS — R0602 Shortness of breath: Secondary | ICD-10-CM | POA: Diagnosis not present

## 2016-03-10 DIAGNOSIS — R062 Wheezing: Secondary | ICD-10-CM | POA: Diagnosis not present

## 2016-03-13 ENCOUNTER — Encounter: Payer: Self-pay | Admitting: Adult Health

## 2016-03-13 ENCOUNTER — Ambulatory Visit (INDEPENDENT_AMBULATORY_CARE_PROVIDER_SITE_OTHER)
Admission: RE | Admit: 2016-03-13 | Discharge: 2016-03-13 | Disposition: A | Discharge status: Home / Self Care | Payer: Medicare Other | Source: Ambulatory Visit | Attending: Adult Health | Admitting: Adult Health

## 2016-03-13 ENCOUNTER — Ambulatory Visit (INDEPENDENT_AMBULATORY_CARE_PROVIDER_SITE_OTHER): Payer: Medicare Other | Admitting: Adult Health

## 2016-03-13 VITALS — BP 144/64 | HR 72 | Temp 98.1°F | Ht 63.0 in | Wt 136.0 lb

## 2016-03-13 DIAGNOSIS — R05 Cough: Secondary | ICD-10-CM | POA: Diagnosis not present

## 2016-03-13 DIAGNOSIS — J209 Acute bronchitis, unspecified: Secondary | ICD-10-CM

## 2016-03-13 DIAGNOSIS — R0602 Shortness of breath: Secondary | ICD-10-CM | POA: Diagnosis not present

## 2016-03-13 MED ORDER — AMOXICILLIN-POT CLAVULANATE 875-125 MG PO TABS: 1.0000 | ORAL_TABLET | Freq: Two times a day (BID) | ORAL | Status: AC

## 2016-03-13 MED ORDER — LEVALBUTEROL HCL 0.63 MG/3ML IN NEBU
0.6300 mg | INHALATION_SOLUTION | Freq: Once | RESPIRATORY_TRACT | Status: AC
  Administered 2016-03-13: 0.63 mg via RESPIRATORY_TRACT

## 2016-03-13 NOTE — Progress Notes (Signed)
Subjective:    Patient ID: [name], female    DOB: 04/22/1915, 82 y.o.   MRN: [id number]  HPI 82 yo female with HTN, VI with edema , recurrent cellulitis , DJD, and anemia .    03/13/2016 Follow up :  Returns for 2 weeks follow up . Complains of persistent cough and congestion .  Pt c/o prod cough with light yellow mucus, chest congestion/tightness, SOB and wheezing, low grade fever since 03/06/16. Denies any itching, nausea or vomiting.   Treated for acute bronchitis last week by urgent care , gave her zpack and neb treatment .  Is feeling some better but still has congestion and feels poorly.   Denies chest pain, orthopnea , nv/d/, or fever.   Pt has been having difficulty with chronic VI with edema and recurrent cellulitis . Was given keflex 1 month ago, redness did improved but was sent to Dermatology .  Dermatology has been doing wraps to lower legs . That has helped. Has follow up with them next week.  Seen by Dr. Lenna Gilford 2 weeks ago and lasix was increased with some help. Wearing support stocking that helps as well.    Past Medical History  Diagnosis Date  . Hypertension   . Hypothyroidism   . Glaucoma   . Acute bronchitis   . Palpitations   . Unspecified venous (peripheral) insufficiency   . Pure hypercholesterolemia   . GERD (gastroesophageal reflux disease)   . Diverticulosis of colon (without mention of hemorrhage)   . Benign neoplasm of colon   . Renal insufficiency   . DJD (degenerative joint disease)   . Vitamin D deficiency disease   . Anxiety state, unspecified   . CKD (chronic kidney disease), stage III 10/06/2013   Current Outpatient Prescriptions on File Prior to Visit  Medication Sig Dispense Refill  . acetaminophen (TYLENOL) 325 MG tablet Take 325 mg by mouth every 4 (four) hours as needed for mild pain or moderate pain.     [name] allopurinol (ZYLOPRIM) 300 MG tablet Take 1 tablet by mouth daily.    [name] ALPRAZolam (XANAX) 0.25 MG tablet TAKE 1/2-1  TABLET BY MOUTH 3 TIMES A DAY AS NEEDED FOR ANXIETY 90 tablet 3  . Ascorbic Acid (VITAMIN C) 500 MG tablet Take 500 mg by mouth daily.      [name] aspirin 81 MG tablet Take 81 mg by mouth daily.      . Cholecalciferol (VITAMIN D) 1000 UNITS capsule Take 1,000 Units by mouth daily.      [name] diltiazem (CARDIZEM CD) 180 MG 24 hr capsule TAKE ONE CAPSULE BY MOUTH EVERY DAY 90 capsule 3  . ferrous sulfate 325 (65 FE) MG tablet Take 1 tablet (325 mg total) by mouth 2 (two) times daily with a meal. 60 tablet 2  . folic acid (FOLVITE) 1 MG tablet Take 1 tablet (1 mg total) by mouth daily. 30 tablet 1  . furosemide (LASIX) 40 MG tablet Take 2 tablets every AM 60 tablet 3  . hydrOXYzine (ATARAX/VISTARIL) 25 MG tablet Take 1 tablet (25 mg total) by mouth every 6 (six) hours as needed. 50 tablet 0  . levothyroxine (SYNTHROID, LEVOTHROID) 125 MCG tablet TAKE 1/2 TABLET BY MOUTH DAILY BEFORE BREAKFAST 90 tablet 3  . loperamide (IMODIUM) 2 MG capsule Take 1 capsule (2 mg total) by mouth as needed for diarrhea or loose stools. 10 capsule 0  . meclizine (ANTIVERT) 25 MG tablet Take 1/2-1 tablet by mouth every 4 hours  as needed for dizziness 50 tablet 0  . Multiple Vitamin (MULTIVITAMIN WITH MINERALS) TABS tablet Take 1 tablet by mouth daily. 30 tablet 0  . mupirocin ointment (BACTROBAN) 2 % 1 APPLICATION APPLY ON THE SKIN AS DIRECTED APPLY TO AFFECTED AREA ON HAND AND COVER AS DIRECTED  0  . Omega-3 Fatty Acids (FISH OIL) 1000 MG CAPS Take 1 capsule by mouth daily.    [name] omeprazole (PRILOSEC) 20 MG capsule Take 20 mg by mouth daily.      . ondansetron (ZOFRAN) 8 MG tablet Take 1 tablet (8 mg total) by mouth every 6 (six) hours as needed for nausea or vomiting. 20 tablet 0  . Polyethyl Glycol-Propyl Glycol (SYSTANE) 0.4-0.3 % SOLN Place 1 drop into both eyes 4 (four) times daily.     . potassium chloride (KLOR-CON M10) 10 MEQ tablet TAKE 2 TABLETS (20 MEQ TOTAL) BY MOUTH DAILY. 180 tablet 3  . predniSONE (DELTASONE) 10 MG  tablet Take 1 tablet (10 mg total) by mouth daily with breakfast. 30 tablet 3  . simvastatin (ZOCOR) 20 MG tablet TAKE 1 TABLET (20 MG TOTAL) BY MOUTH DAILY. 90 tablet 1  . thiamine 100 MG tablet Take 1 tablet (100 mg total) by mouth daily. 30 tablet 1  . traMADol (ULTRAM) 50 MG tablet Take 1 tablet (50 mg total) by mouth every 8 (eight) hours as needed. 90 tablet 0   No current facility-administered medications on file prior to visit.      Review of Systems Constitutional:   No  weight loss, night sweats,  Fevers, chills, fatigue, or  lassitude.  HEENT:   No headaches,  Difficulty swallowing,  Tooth/dental problems, or  Sore throat,                No sneezing, itching, ear ache, nasal congestion, post nasal drip,   CV:  No chest pain,  Orthopnea, PND, swelling in lower extremities, anasarca, dizziness, palpitations, syncope.   GI  No heartburn, indigestion, abdominal pain, nausea, vomiting, diarrhea, change in bowel habits, loss of appetite, bloody stools.   Resp: No shortness of breath with exertion or at rest.  No excess mucus, no productive cough,  No non-productive cough,  No coughing up of blood.  No change in color of mucus.  No wheezing.  No chest wall deformity  Skin: no rash or lesions.  GU: no dysuria, change in color of urine, no urgency or frequency.  No flank pain, no hematuria   MS:  No joint pain or swelling.  No decreased range of motion.  No back pain.  Psych:  No change in mood or affect. No depression or anxiety.  No memory loss.         Objective:   Physical Exam Filed Vitals:   03/13/16 1150  BP: 144/64  Pulse: 72  Temp: 98.1 F (36.7 C)  TempSrc: Oral  Height: 5\' 3"  (1.6 m)  Weight: 136 lb (61.689 kg)  SpO2: 97%   GEN: A/Ox3; pleasant , NAD, well nourished , elderly   HEENT:  E. Lopez/AT,  EACs-clear, TMs-wnl, NOSE-clear, THROAT-clear, no lesions, no postnasal drip or exudate noted.   NECK:  Supple w/ fair ROM; no JVD; normal carotid impulses w/o  bruits; no thyromegaly or nodules palpated; no lymphadenopathy.  RESP  Clear  P & A; w/o, wheezes/ rales/ or rhonchi.no accessory muscle use, no dullness to percussion  CARD:  RRR, no m/r/g  , 1+ peripheral edema, -support stocking, pulses intact, no cyanosis or  clubbing.  GI:   Soft & nt; nml bowel sounds; no organomegaly or masses detected.  Musco: Warm bil, no deformities or joint swelling noted.   Neuro: alert, no focal deficits noted.    Skin: Warm, no lesions or rashes  Heba Ige NP-C  [facility]  03/13/2016       Assessment & Plan:

## 2016-03-13 NOTE — Addendum Note (Signed)
Addended by: Osa Craver on: 03/13/2016 12:17 PM   Modules accepted: Orders

## 2016-03-13 NOTE — Assessment & Plan Note (Signed)
Slow to resolve  Check cxr today  xopenex neb x 1   Plan  Begin Augmentin 875mg  Twice daily  For 7 days , take with food  Eat ygourt daily .  Mucinex DM Twice daily  As needed  Cough/congestion  Increase Prednisone 10mg  2 tabs daily for 1 week then 10mg  daily  Chest xray today .  Follow up Dr. Lenna Gilford  In 3-4 weeks and As needed   Please contact office for sooner follow up if symptoms do not improve or worsen or seek emergency care

## 2016-03-13 NOTE — Patient Instructions (Signed)
Begin Augmentin 875mg  Twice daily  For 7 days , take with food  Eat ygourt daily .  Mucinex DM Twice daily  As needed  Cough/congestion  Increase Prednisone 10mg  2 tabs daily for 1 week then 10mg  daily  Chest xray today .  Follow up Dr. Lenna Gilford  In 3-4 weeks and As needed   Please contact office for sooner follow up if symptoms do not improve or worsen or seek emergency care

## 2016-03-15 ENCOUNTER — Telehealth: Payer: Self-pay | Admitting: Pulmonary Disease

## 2016-03-15 NOTE — Progress Notes (Signed)
Quick Note:  Pt's daughter Manuela Schwartz is aware per 6.23.17 phone note ______

## 2016-03-15 NOTE — Telephone Encounter (Signed)
Called spoke with Manuela Schwartz and discussed cxr results / recs as stated by TP: Result Notes       Notes Recorded by Melvenia Needles, NP on 03/14/2016 at 3:25 PM No sign of PNA  Cont w/ ov recs Please contact office for sooner follow up if symptoms do not improve or worsen or seek emergency care    Manuela Schwartz voiced her understanding Nothing further needed; will sign off

## 2016-03-19 DIAGNOSIS — I8312 Varicose veins of left lower extremity with inflammation: Secondary | ICD-10-CM | POA: Diagnosis not present

## 2016-03-19 DIAGNOSIS — I872 Venous insufficiency (chronic) (peripheral): Secondary | ICD-10-CM | POA: Diagnosis not present

## 2016-03-19 DIAGNOSIS — I8311 Varicose veins of right lower extremity with inflammation: Secondary | ICD-10-CM | POA: Diagnosis not present

## 2016-03-19 DIAGNOSIS — Z85828 Personal history of other malignant neoplasm of skin: Secondary | ICD-10-CM | POA: Diagnosis not present

## 2016-03-27 ENCOUNTER — Ambulatory Visit (INDEPENDENT_AMBULATORY_CARE_PROVIDER_SITE_OTHER): Payer: Medicare Other | Admitting: Podiatry

## 2016-03-27 ENCOUNTER — Encounter: Payer: Self-pay | Admitting: Podiatry

## 2016-03-27 VITALS — BP 143/62 | HR 84 | Resp 16

## 2016-03-27 DIAGNOSIS — M2042 Other hammer toe(s) (acquired), left foot: Secondary | ICD-10-CM

## 2016-03-27 DIAGNOSIS — L84 Corns and callosities: Secondary | ICD-10-CM

## 2016-03-27 NOTE — Progress Notes (Signed)
   Subjective:    Patient ID: [name], female    DOB: Dec 26, 1914, 82 y.o.   MRN: [id number]  HPI Chief Complaint  Patient presents with  . Callouses    3rd toe  . Toe Pain    3rd toe (2nd toe going over 3rd toe)      Review of Systems  All other systems reviewed and are negative.      Objective:   Physical Exam        Assessment & Plan:

## 2016-03-28 NOTE — Progress Notes (Signed)
Subjective:     Patient ID: [name], female   DOB: 1915/09/03, 82 y.o.   MRN: [id number]  HPI patient presents with a painful third toe of the left foot and states the second toe lies on it and presents with her daughter. States it's been increasingly tender over the last few months and it makes it hard to wear shoe gear   Review of Systems  All other systems reviewed and are negative.      Objective:   Physical Exam  Constitutional: She is oriented to person, place, and time.  Cardiovascular: Intact distal pulses.   Musculoskeletal: Normal range of motion.  Neurological: She is oriented to person, place, and time.  Skin: Skin is warm and dry.  Nursing note and vitals reviewed.  neurovascular status was found to be intact with muscle strength found to be diminished and range of motion lost of the subtalar midtarsal joint. I noted digital deformities with rigid contracture lesser digits left over right with the second toe lying moderately on the third toe left foot with distal keratotic lesion third left that's very painful when pressed. Patient's found to have digital perfusion and 4 seconds and is well oriented     Assessment:     Structural hammertoe deformity with rigid contracture of the MPJ and distal lesion third left    Plan:     H&P and condition reviewed. Today I debrided lesion third left and a small amount of bleeding on applied sterile dressing and I instructed on utilization of buttress pad and padding for the toe. I do not recommend surgery unless absolutely necessary

## 2016-04-03 ENCOUNTER — Encounter: Payer: Self-pay | Admitting: Pulmonary Disease

## 2016-04-03 ENCOUNTER — Ambulatory Visit (INDEPENDENT_AMBULATORY_CARE_PROVIDER_SITE_OTHER): Payer: Medicare Other | Admitting: Pulmonary Disease

## 2016-04-03 ENCOUNTER — Other Ambulatory Visit (INDEPENDENT_AMBULATORY_CARE_PROVIDER_SITE_OTHER): Payer: Medicare Other

## 2016-04-03 VITALS — BP 108/60 | HR 82 | Temp 98.8°F | Ht 63.0 in | Wt 133.5 lb

## 2016-04-03 DIAGNOSIS — I8311 Varicose veins of right lower extremity with inflammation: Secondary | ICD-10-CM | POA: Diagnosis not present

## 2016-04-03 DIAGNOSIS — M159 Polyosteoarthritis, unspecified: Secondary | ICD-10-CM

## 2016-04-03 DIAGNOSIS — Z872 Personal history of diseases of the skin and subcutaneous tissue: Secondary | ICD-10-CM

## 2016-04-03 DIAGNOSIS — N183 Chronic kidney disease, stage 3 unspecified: Secondary | ICD-10-CM

## 2016-04-03 DIAGNOSIS — D638 Anemia in other chronic diseases classified elsewhere: Secondary | ICD-10-CM

## 2016-04-03 DIAGNOSIS — I10 Essential (primary) hypertension: Secondary | ICD-10-CM

## 2016-04-03 DIAGNOSIS — I359 Nonrheumatic aortic valve disorder, unspecified: Secondary | ICD-10-CM

## 2016-04-03 DIAGNOSIS — I872 Venous insufficiency (chronic) (peripheral): Secondary | ICD-10-CM

## 2016-04-03 DIAGNOSIS — Z85828 Personal history of other malignant neoplasm of skin: Secondary | ICD-10-CM | POA: Diagnosis not present

## 2016-04-03 DIAGNOSIS — R609 Edema, unspecified: Secondary | ICD-10-CM

## 2016-04-03 DIAGNOSIS — M150 Primary generalized (osteo)arthritis: Secondary | ICD-10-CM

## 2016-04-03 DIAGNOSIS — I8312 Varicose veins of left lower extremity with inflammation: Secondary | ICD-10-CM | POA: Diagnosis not present

## 2016-04-03 LAB — BASIC METABOLIC PANEL
BUN: 27 mg/dL — ABNORMAL HIGH (ref 6–23)
CHLORIDE: 92 meq/L — AB (ref 96–112)
CO2: 29 meq/L (ref 19–32)
Calcium: 9.1 mg/dL (ref 8.4–10.5)
Creatinine, Ser: 1.51 mg/dL — ABNORMAL HIGH (ref 0.40–1.20)
GFR: 33.74 mL/min — AB (ref >60.00)
GLUCOSE: 90 mg/dL (ref 70–99)
POTASSIUM: 5 meq/L (ref 3.5–5.1)
SODIUM: 139 meq/L (ref 135–145)

## 2016-04-03 LAB — CBC WITH DIFFERENTIAL/PLATELET
BASOS ABS: 0 10*3/uL (ref 0.0–0.1)
Basophils Relative: 0.3 % (ref 0.0–3.0)
EOS ABS: 0 10*3/uL (ref 0.0–0.7)
Eosinophils Relative: 0.5 % (ref 0.0–5.0)
HEMATOCRIT: 30.2 % — AB (ref 36.0–46.0)
HEMOGLOBIN: 9.9 g/dL — AB (ref 12.0–15.0)
LYMPHS PCT: 19.8 % (ref 12.0–46.0)
Lymphs Abs: 1.6 10*3/uL (ref 0.7–4.0)
MCHC: 32.8 g/dL (ref 30.0–36.0)
MCV: 97.9 fl (ref 78.0–100.0)
MONOS PCT: 7.1 % (ref 3.0–12.0)
Monocytes Absolute: 0.6 10*3/uL (ref 0.1–1.0)
Neutro Abs: 5.7 10*3/uL (ref 1.4–7.7)
Neutrophils Relative %: 72.3 % (ref 43.0–77.0)
Platelets: 287 10*3/uL (ref 150.0–400.0)
RBC: 3.08 Mil/uL — AB (ref 3.87–5.11)
RDW: 16.6 % — ABNORMAL HIGH (ref 11.5–15.5)
WBC: 7.8 10*3/uL (ref 4.0–10.5)

## 2016-04-03 LAB — SEDIMENTATION RATE: Sed Rate: 70 mm/hr — ABNORMAL HIGH (ref 0–30)

## 2016-04-03 MED ORDER — TRAMADOL HCL 50 MG PO TABS: 50.0000 mg | ORAL_TABLET | Freq: Three times a day (TID) | ORAL | Status: DC | PRN

## 2016-04-03 MED ORDER — PREDNISONE 10 MG PO TABS: 10.0000 mg | ORAL_TABLET | Freq: Every day | ORAL | Status: DC

## 2016-04-03 MED ORDER — LEVOTHYROXINE SODIUM 125 MCG PO TABS: ORAL_TABLET | ORAL | Status: DC

## 2016-04-03 MED ORDER — SIMVASTATIN 20 MG PO TABS: ORAL_TABLET | ORAL | Status: DC

## 2016-04-03 MED ORDER — ALPRAZOLAM 0.25 MG PO TABS: ORAL_TABLET | ORAL | Status: DC

## 2016-04-03 NOTE — Patient Instructions (Signed)
Today we updated your med list in our EPIC system...    Continue your current medications the same- including the Lasix40mg  - 2 tabs each AM...  Please restart the PREDNISONE 10mg  tabs- one tab daily and stay on this until your return visit...  We will try to get you into an edema clinic to manage the leg swelling, but in the meantime you need to check back w/ your Dermatologist...  Today we checked your follow up blood work...    We will contact you w/ the results when available...   REMEMBER-- NO SALT, & keep your legs elevated as much as poss...  Let's plan a follow up visit in 2-3 weeks for recheck & to adjust the dose on the Prednisone.Marland KitchenMarland Kitchen

## 2016-04-09 DIAGNOSIS — I8311 Varicose veins of right lower extremity with inflammation: Secondary | ICD-10-CM | POA: Diagnosis not present

## 2016-04-09 DIAGNOSIS — Z85828 Personal history of other malignant neoplasm of skin: Secondary | ICD-10-CM | POA: Diagnosis not present

## 2016-04-09 DIAGNOSIS — I8312 Varicose veins of left lower extremity with inflammation: Secondary | ICD-10-CM | POA: Diagnosis not present

## 2016-04-09 DIAGNOSIS — I872 Venous insufficiency (chronic) (peripheral): Secondary | ICD-10-CM | POA: Diagnosis not present

## 2016-04-09 DIAGNOSIS — M7981 Nontraumatic hematoma of soft tissue: Secondary | ICD-10-CM | POA: Diagnosis not present

## 2016-04-16 DIAGNOSIS — H401134 Primary open-angle glaucoma, bilateral, indeterminate stage: Secondary | ICD-10-CM | POA: Diagnosis not present

## 2016-04-18 ENCOUNTER — Ambulatory Visit (INDEPENDENT_AMBULATORY_CARE_PROVIDER_SITE_OTHER): Payer: Medicare Other | Admitting: Pulmonary Disease

## 2016-04-18 ENCOUNTER — Other Ambulatory Visit (INDEPENDENT_AMBULATORY_CARE_PROVIDER_SITE_OTHER): Payer: Medicare Other

## 2016-04-18 VITALS — BP 130/60 | HR 84 | Temp 98.4°F | Ht 63.0 in | Wt 128.5 lb

## 2016-04-18 DIAGNOSIS — I10 Essential (primary) hypertension: Secondary | ICD-10-CM

## 2016-04-18 DIAGNOSIS — Z872 Personal history of diseases of the skin and subcutaneous tissue: Secondary | ICD-10-CM

## 2016-04-18 DIAGNOSIS — I872 Venous insufficiency (chronic) (peripheral): Secondary | ICD-10-CM | POA: Diagnosis not present

## 2016-04-18 DIAGNOSIS — R609 Edema, unspecified: Secondary | ICD-10-CM

## 2016-04-18 DIAGNOSIS — I359 Nonrheumatic aortic valve disorder, unspecified: Secondary | ICD-10-CM

## 2016-04-18 DIAGNOSIS — E039 Hypothyroidism, unspecified: Secondary | ICD-10-CM | POA: Diagnosis not present

## 2016-04-18 DIAGNOSIS — N183 Chronic kidney disease, stage 3 unspecified: Secondary | ICD-10-CM

## 2016-04-18 LAB — BASIC METABOLIC PANEL
BUN: 33 mg/dL — AB (ref 6–23)
CHLORIDE: 97 meq/L (ref 96–112)
CO2: 31 mEq/L (ref 19–32)
CREATININE: 1.28 mg/dL — AB (ref 0.40–1.20)
Calcium: 9.5 mg/dL (ref 8.4–10.5)
GFR: 40.82 mL/min — ABNORMAL LOW (ref >60.00)
GLUCOSE: 71 mg/dL (ref 70–99)
POTASSIUM: 4.7 meq/L (ref 3.5–5.1)
Sodium: 134 mEq/L — ABNORMAL LOW (ref 135–145)

## 2016-04-18 LAB — TSH: TSH: 1.42 u[IU]/mL (ref 0.35–4.50)

## 2016-04-18 LAB — SEDIMENTATION RATE: Sed Rate: 15 mm/hr (ref 0–30)

## 2016-04-18 NOTE — Patient Instructions (Signed)
Today we updated your med list in our EPIC system...    Continue your current medications the same...  I am pleased that you are so much better back on the Prednisone Rx...  Today we rechecked your blood work...    We will contact you w/ the results when available...     We will then decide on any change in the Prednisone dosing...  Remember> NO SALT...  Call for any questions...  Let's plan a follow up visit in 60mo, sooner if needed for problems.Marland KitchenMarland Kitchen

## 2016-04-19 ENCOUNTER — Encounter: Payer: Self-pay | Admitting: Pulmonary Disease

## 2016-04-19 NOTE — Progress Notes (Signed)
Subjective:    Patient ID: [name], female    DOB: 10-16-1914, 82 y.o.   MRN: [id number]  HPI 82 y/o WF here for a follow up visit... she has mult med problems including:  Hx asthmatic bronchitis;  HBP;  Ven Insuffic & edema;  Hyperchol;  Hypothyroid;  GERD/ Divertics/ Colon polyps;  Renal insuffic;  DJD;  Vit D defic;  Anxiety...  ~  SEE PREV EPIC NOTES FOR OLDER DATA >>    LABS 11/14:  Chems- ok w/ BS=108, Cr=1.3;  Uric=4.3 on Allopurinol100;  CBC- ok w/ Hg=12.6.Marland KitchenMarland Kitchen  December 15, 2013:  63moROV & [facility]13 w/ RUQ pain & dx w/ gallstones, cholecystitis, & had ERCP w/ sphincterotomy (DrStark) & stone extraction; treated w/ Cipro, LFTs improved, course complicated by LE cellulitis requiring addition of Vanco IV; she had f/u DrCornett, C[facility] 2/15> brief note, did not rec elective surg given her age...  June 14, 2014:  [facility] by [facility] w/ sudden fever to 103, chills, and left leg pain/ redness/ swelling=> c/w severe cellulitis; she was felt to be septic but no lesions on leg, no areas of broken skin, wearing support hose, her Lasix dose was cut from 836mam to 4059m, other meds the same...   CXR 9/15 showed borderline cardiomeg, underlying chr lung dis, left base atx vs effusion, ?early RUL opac...   EKG 9/15 showed NSR w/ PACs, rate92, NSSTTWA, NAD...  MRI of left leg 9/15> diffuse SQ soft tissue swelling & edema, no discrete abscess identified, no osteomyelitis, no signs of septic arthritis, etc (c/w severe cellulitis).  Ven Dopplers 9/15> no evid of DVT or superficial thrombosis in left leg, enlarged LN's noted in left groin  LABS 9/15 in Hosp> Hg=10-11 range, WBC=29K=>11.7 by disch, BUN/Cr= 39/1.3 improved to 27/0.9 by disch, MRSA=neg, UA=clear...  LABS 9/15 in office f/u>  Chems- ok x Na=131, Cr=1.4;  CBC- ok x   CT Abd&Pelvis 9/15> showed no acute abnormality & no venous obstruction or lymphocele to suggest a cause for left leg  swelling; Cholelithiasis; unchanged bilateral adnexal cystic lesions (probably cysts); unchanged renal cysts and left renal atrophy; unchanged left perianal lipoma...  CXR 10/15 showed clearing of patchy RUL opac & left effusion, lungs now clear & back to baseline, NAD...  LABS 101/15:  Chems- ok x Na=132, BUN=33, Cr=1.3;  CBC- ok w/ Hg=12.0, WBC=7.6;  Sed=54 (improved from 90)...  ~  December 06, 2014:  59mo63mo & [name] w/ right leg cellulitis- states she was out shopping & "over-did it", felt weak & tired then developed right leg discomfort, increased swelling/ redness/ etc; WBC was 21K, VenDopplers were neg for DVT, treated w/ Zosyn/ Vanco & changed to oral Doxy at disch;  She notes that her leg is improved- less swollen, less red, less painful but still sl tender; she notes that right leg first started giving her trouble after the birth of her last child; she had thorough vasc eval by DrEarly in the past and no surgery indicated; she knows to elim salt/sodium, elev legs, wear support hose; she has finished the Doxy100Bid given at disch & she misunderstood the DC directions and has been taking her prev Lasix80mg13m... We reviewed the following medical problems during today's office visit >> NOTE: as an afterthought she noted vag itching & wanted Rx- offered Diflucan orally & if not improved she will need Gyn eval (she  has Atarax as well on her med list)...     HBP> on Diltiazem180, Furosemide 19mQam, & off KCl since disch;  BP=134/62 today & she denies CP, palpit, dizzy, ch in SOB, etc...    VI, Edema, Cellulitis> she knows to elim sodium, elevate, wear support hose; still taking Lasix80Qam; Hosp 3/16 as above & disch on Doxy- now out & right leg improved she says; RLE chr larger than LLE, sl red/ tender; Rec decr Lasix40.     CHOL> on Simva20 Qhs + low fat diet; last FLP 5/13 showed TChol 191, TG 81, HDL 82, LDL 93... Needs to ret fasting for f/u  blood work.    Hypothy> on Synthroid 1224m- 1/2 tab daily; labs 3/16 showed TSH= 1.21    Renal Insuffic> Creat prev up to 1.8 on the Lasix 8014mCreat 3/16= 1.52=>1.12    Derm> she has Psoriasis on her arms & has been evaluated by DrJordan/Jones; they also treated the LE cellulitis; Rec Atarax prn itching...    Anemia> Hosp 1/15 w/ gallstones and cholecystits (no surg); Hg= 7.5-9.4 at that time & Hg improved to 12.7; labs 3/16 showed Hg= 13.2=>10.6, Fe=13 (7%sat), Ferritin=104, on FeSO4... We reviewed prob list, meds, xrays and labs> see below for updates >>   CXR 3/16 showed norm heart size, mild left base atx, otherw OK...   EKG 3/16 showed NSR, rate86, PACs, poor r prog V1-2, NAD... Marland KitchenMarland KitchenVen Dopplers 3/16 were neg for DVT...   2DEcho 3/16 showed mild LVH w/ mild focal basal septal hypertrophy, norm LVF w/ EF=60-65%, norm wall motion, Gr1DD, norm AoV, mild MR, mild LA dil, norm RV, PAsys=38...  LABS 3.16:  Chems- ok x BUN=37 due to Lasix80 she was on & we decr her to 50m74m CBC- wnl,  Sed=65;  PCT<0.10 indicating +inflamm but no infection now...  PLAN>> we decided to check f/u labs (BMet, CBC, Sed, PCT); Rec to apply cool compresses to right leg several times daily, elim salt/sodium, elev legs, wear support hose; recurrent cellulitis episodes may require suppressive antibiotic therapy... Labs ret w/ BUN=37 7 we decr her Lasix from 80=>40/d; elev Sed w/ neg PCT & we are holding any further antibiotics for now...  ~  April 05, 2015:  23mo 81mo& Savita's CC is orthopedic w/ hip and back pain & she tells me that DrNewton has diagnosed Gout, Rx Allopurinol300- we do not have notes from him; Recall prev evals from Rheum- DrDeveshwar, Dx w/ Gout tophi (on Allopurinol), severe OA in hands and hips, s/p bilat TKRs- on Allopurinol, Tramadol (refilled), Tylenol... We reviewed the following medical problems during today's office visit >>     HBP> on Diltiazem180, Furosemide 50mgQ71m& KCl Bid;  BP=120/70  today & she denies CP, palpit, dizzy, ch in SOB, etc...    VI, Edema, Cellulitis> she knows to elim sodium, elevate, wear support hose; still taking Lasix40Qam; Hosp 3/16 as above & disch on Doxy- right leg improved she says; RLE chr larger than LLE, sl red/ tender; Rec continue Lasix40.     CHOL> on Simva20 Qhs + low fat diet; last FLP 5/13 showed TChol 191, TG 81, HDL 82, LDL 93... Needs to ret fasting for f/u blood work.    Hypothy> on Synthroid 125mcg-59m tab daily; labs 3/16 showed TSH= 1.21    Renal Insuffic> Creat prev up to 1.8 on Lasix 80mg; C61m 3/16= 1.52=>1.12; Labs 7/16 stable w/ BUN=35, Cr=1.13    Hx severe DJD, Gout w/ tophi, s/p bilat  TKRs> prev followed by Ladene Artist; on Allopurinol, Tramadol, Tylenol...    Derm> she has Psoriasis on her arms & has been evaluated by DrJordan/Jones; they also treated the LE cellulitis; Rec Atarax prn itching...    Anemia> Hosp 1/15 w/ gallstones and cholecystits (no surg); Hg= 7.5-9.4 at that time & Hg improved to 12.7; labs 7/16 showed Hg= 12.1, Fe=102 (32%sat), Ferritin=116, on FeSO4/d... We reviewed prob list, meds, xrays and labs> see below for updates >>   LABS 7/16:  Chems- ok w/ BUN=35, Cr=1.13, BS=100, BNP=268;  CBC- ok w/ Hg=12.1, Fe=102 (32%), Ferritin=116...  ~  August 07, 2015:  42moROV & post-ER follow up visit> [place] c/o HA rated 8/10> frontal HA, assoc n/v, felt weak & unsteady; BP was ok, VS were stable, Labs ok, CT Head was neg- wnl; Tylenol helped... we reviewed the following medical problems during today's office visit >>     HBP> on Diltiazem180, Furosemide 436mam, & KCl Bid;  BP=124/60 today & she denies CP, palpit, dizzy, ch in SOB, etc...    VI, Edema, Hx cellulitis> she knows to elim sodium, elevate, wear support hose; on Lasix40Qam; [facility] on D[facility]- right leg improved; RLE chr larger than LLE, sl red/ tender; Rec continue Lasix40.     CHOL> on Simva20 Qhs + low fat diet; last FLP 5/13  showed TChol 191, TG 81, HDL 82, LDL 93... Needs to ret fasting for f/u blood work.    Hypothy> on Synthroid 12511m 1/2 tab daily; labs 3/16 showed TSH= 1.21    Renal Insuffic> Creat prev up to 1.8 on Lasix 74m32mreat 3/16= 1.52=>1.12; Labs 7/16 stable w/ BUN=35, Cr=1.13    Hx severe DJD, Gout w/ tophi, s/p bilat TKRs> prev followed by DrDeveshwar; on Allopurinol, Tramadol, Tylenol...    Derm> she has Psoriasis on her arms & has been evaluated by DrJordan/Jones; they also treated the LE cellulitis; Rec Atarax prn itching...    Anemia> Hosp 1/15 w/ gallstones and cholecystits (no surg); Hg= 7.5-9.4 at that time & Hg improved to 12.7; labs 7/16 showed Hg= 12.1, Fe=102 (32%sat), Ferritin=116, on FeSO4/d...    Anxiety>  On Xanax 0.25- 1/2 to 1 tab tid prn... EXAM shows Afeb, VSS, O2sat=96% on RA:  Heent- neg, mallampati1;  Chest- clear w/o w/r/r;  Heart- RR gr1/6SEM w/o r/g;  Abd- soft, nontender, neg;  Ext- VI, 1+edema, ecchymoses;  Neuro- no focal neuro deficits...   CT Head 07/20/15>  Intact, WNL, NAD...  Labs 06/2015>  Chems- wnl;  CBC- wnl... IMP/PLAN>>  OK Flu vaccine today;  Continue same meds....  ~  February 28, 2016:  60mo 33mo& Jackelyne was seen by MW 5/[facility]/17 w/ ?cellulitis right leg, she had been seeing Derm & Rx w/ creams but no better; she was Afeb, WBC=8.4K, Sed=44; he prescribed Keflex but no better she says & pt has followed up w/ Derm=> legs wrapped Qwk now, swelling worse, wt up 7# to 142# today => this time Rx w/ Pred10, Atarax, incr Lasix80 & no salt...    HBP> on Diltiazem180, Furosemide 40mgQ98m& KCl 10Bid;  BP=120/60 today & she denies CP, palpit, dizzy, ch in SOB, etc...    VI, Edema, Hx cellulitis> she knows to elim sodium, elevate, wear support hose; on Lasix40Qam; Hosp 3/16 & disch on Doxy; Hx cellulitis & non-infection inflammation treated w/ diff antibiotics and Pred, Lasix, no salt, wraps & creams from Derm DScherrie Merrittsisits, and us...KoreaMarland KitchenMarland Kitchen  CHOL> on Simva20 Qhs + low fat  diet; last FLP 5/13 showed TChol 191, TG 81, HDL 82, LDL 93... Needs to ret fasting for f/u blood work.    Hypothy> on Synthroid 167mg- 1/2 tab daily; labs 3/16 showed TSH= 1.21    Renal Insuffic> Creat prev up to 1.8 on Lasix '80mg'$ ; Creat 3/16= 1.52=>1.12; Labs 2016 stable w/ BUN=23-37, Cr=1.13-1.28; Labs 5/17 showed BUN=25, Cr=1.33    Hx severe DJD, Gout w/ tophi, s/p bilat TKRs> prev followed by DrDeveshwar; on Allopurinol, Tramadol, Tylenol...    Derm> she has Psoriasis on her arms & has been evaluated by DrJordan/Jones; they also treated the LE cellulitis/ inflamm w/ wraps and creams, Atarax prn itching...    Anemia> Hosp 1/15 w/ gallstones and cholecystits (no surg); Hg= 7.5-9.4 at that time & Hg improved to 12.7; labs 7/16 showed Hg= 12.1, Fe=102 (32%sat), Ferritin=116, on FeSO4/d; labs 5/16 showed Hg=11.8    Anxiety>  On Xanax 0.25- 1/2 to 1 tab tid prn... EXAM shows Afeb, VSS, O2sat=98% on RA:  Heent- neg, mallampati1;  Chest- decr BS w/o w/r/r;  Heart- RR gr1/6SEM w/o r/g;  Abd- soft, nontender, neg;  Ext- VI, +edema to above knees, leg wrapped;  Neuro- intact... IMP/PLAN>>  Not better w/ Keflex, looks more inflammed than infected=> Rx w/ Pred10, Atarax25 prn, incr Lasix40-2Qam & no salt, ROV in 2wks...   ~  April 03, 2016:  [place]16 c/o chest congestion, wheezing, cough, sl yellow sput, ?low grade fever; had already been to [facility]; CXR showed diffuse peribronchial cuffing, no airsp dis; given Augmentin, bump Pred, Mucinex;  She states "I was never that sick before" but breathing is fine now & she denies CP/ change is SOB/ cough/ sputum, f/c/s, etc;  But while her breathing is improved- her left leg is swollen & painful again- NOTE: she ran out of Pred '10mg'$ /d 2 weeks ago!  We decided to check labs>  Chems- ok x BUN=27, Cr=1.51;  CBC- anemic w/ Hg=9.9, MCV=98, WBC=7.8; Sed=70 (up from 44);  Rx w/ Lasix80 & Pred'10mg'$ /d again; ROV in 2-3wks and continue follow up  w/ Derm...  ~  April 18, 2016:  2wk RLandoverreports that her legs are much improved, decr swelling, wt down 5# more to 1293 today; skin is improved w/ the Pred10- less red, no open areas, no weeping, no cellulitis, no f/c/s;  She went back to [employer] no wraps used just topical cream which didn't seem to help much previously;  She spent a lot of the visit talking about her glaucoma- [facility] [place] know that she is back on eye drops now;  We rechecked labs today>  Chems- ok w/ BUN=33, Cr=1.28;  TSH=1.42;  Sed=15 now on Pred '10mg'$ /d...     EXAM shows Afeb, VSS, O2sat=100% on RA:  Heent- neg, mallampati1;  Chest- decr BS w/o w/r/r;  Heart- RR gr1/6SEM w/o r/g;  Abd- soft, nontender, neg;  Ext- VI, much improved w/ decr edema & erythema;  Neuro- intact... IMP/PLAN>>  Rec to contuinue low sodium diet & the Lasix40- 2tabs Qam;  OK to wean the Pred '10mg'$  tabs to 1/2 tab Qam & stay on this!  ROV in 1 month...           Problem List:  GLAUCOMA (ICD-365.9) - hx mult eye problems w/ prev  corneal transplant & glaucoma laser eye surg... on eye drops per [facility]mology at [facility]...  ASTHMATIC BRONCHITIS, ACUTE (ICD-466.0) - Hx AB requiring Rx w/ Depo/ Pred/ Mucinex/ Tussionex... ~  CXR 4/11 showed vague nodular densities on left... ~  8/11:  she went to an [facility] w/ cough, sputum, & dx w/ pneumonia rx w/ ZPak... ~  CXR 3/12 showed borderline cardiomeg, nodular opac left base, NAD... ~  Spring 2013: she notes some allergy symptoms... ~  CXR 1/15 showed mild cardiomeg, atherosclerotic & tortuous Ao, low lung vol & sl incr markings, NAD.[name].  ~  CXR 9/15 showed borderline cardiomeg, underlying chr lung dis, left base atx vs effusion, ?early RUL opac... ~  CXR 10/15 showed clearing of patchy RUL opac & left effusion, lungs now clear & back to baseline, NAD ~  CXR 3/16 in hosp showed norm heart size, mild left base  atx, otherw OK...   HYPERTENSION (ICD-401.9) - controlled on ASA '81mg'$ /d,  CARTIAXT '180mg'$ /d, LASIX '40mg'$ -  1-2Qam... ~  7/10:  labs showed BUN=54, Creat=2.0 & rec to decr diuretics in half= one Demedex & 1/2 Aldactone. ~  11/10:  labs showed BUN=38, Creat=1.8 & rec to keep same meds. ~  8/11:  labs showed BUN=42, Creat=2.1, K=4.3 ~  1/12:  labs in ER showed BUN=56, Creat=2.67, therefore diuretics decr to Demadex20/d only. ~  3/12:  2DEcho showed mod LVH, norm sys function w/ EF=60-65% & no regional wall motion abn; Gr1DD, mibile density on AoV- prob lambl'e escrescence, mildMR. ~  5/12:  f/u labs in office showed BUN=45, Creat=1.8, on Lasix '80mg'$  Qam for her edema... ~  9/12:  Labs by DrDeveshwar 9/12 showed BUN=33, Creat=1.47, edema resolved therefore decr Lasix to '40mg'$ /d. ~  1/13:  BP= 160/76 but hasn't taken meds today; labs improved w/ BUN=26, Creat=1.3, BNP=50 ~  5/13:  BP= 140/78 & denies CP, palpit, SOB, edema; labs improved w/ BUN=28, Creat=1.2 ~  9/13:  BP= 140/60 & she denies CP, palpt, ch in SOB or edema... ~  12/13: controlled on Diltiazem180 & Furosemide '40mg'$ -2Qam w/ BP=138/62 today & she denies CP, palpit, dizzy, ch inSOB, etc... ~  3/14:  BP= 130/66 on same meds; Labs stable as well w/ Cr=1.5, continue same... ~  7/14: on Diltiazem180 & Furosemide '40mg'$ -2Qam w/ BP=150/64 today & she denies CP, palpit, dizzy, ch in SOB, etc. ~  11/14: BP= 126/64 on same meds, stable... ~  EKG 1/15 showed NSR, rate92, NSSTTWA, NAD... ~  3/15: on Diltiazem180 & Furosemide '40mg'$ -2Qam w/ BP=118/70 today & she denies CP, palpit, dizzy, ch in SOB, etc. ~  9/15: post hosp> off Diltiazem now & still on Furosemide '40mg'$ - 1-2Qam & K10-2/d w/ BP=146/60 w/ leg swelling, edema, cellulitis as below... ~  1/16: on Diltiazem180, Furosemide '40mg'$ -2Qam, & K10-2/d;  BP=132/64 today & she denies CP, palpit, dizzy, ch in SOB, etc. ~  3/16: on Diltiazem180, Furosemide '40mg'$ Qam, & off KCl since disch;  BP=134/62 today & she  denies CP, palpit, dizzy, ch in SOB, etc ~  7/16: on Diltiazem180, Furosemide '40mg'$ Qam, & KCl Bid;  BP=120/70 & she remains asymptomatic...  PALPITATIONS, HX OF (ICD-V12.50) ~  3/16:  EKG showed NSR, rate86, PACs, poor r prog V1-2, NAD...  VENOUS INSUFFICIENCY/ EDEMA >> swelling controlled w/ diuretics, low sodium diet, elevation, support hose, etc... RIGHT LEG SWELLING > LEFT LEG... Pt notes it's been like this since her last child was born... Hx RECURRENT CELLULITIS >>  ~  She saw DrEarly [facility]- there was  nothing he could do... ~  Swelling diminished w/ low sodium, elevation, support hose, & Lasix40 1-2tabs daily... ~  Nov-Dec2013: she knows to elim sodium, elevate, wear support hose; she has been seen by [facility], DrEarly & there is nothing they can do; recently checked by Payton Mccallum- DrJones w/ patch dressing & support hose; edema decr w/ Lasix '80mg'$ /d; Lab today shows Creat=1.5.[name]. ~  7/14:  she knows to elim sodium, elevate, wear support hose, & the Lasix80; weight is down 10# today to 141# w/ resolution of edema; [name] (Derm) treated dermatitis w/ new cream & improved. ~  11/14: wt is back up 8# to 149# today & she needs to elim sodium, elev legs, etc... ~  3/15: she knows to elim sodium, elevate, wear support hose, & the Lasix80; weight is back down 9# today to 137#.[name]. ~  9//15: she was [facility] for cellulitis in LLE (NOS) w/ 4+edema etc; VenDopplers neg for DVT; no open wounds/ drainage; treated w/ IV antibiotics and disch on Keflex; has persistent erythema, pain, swelling & we decided to continue the Keflex & refer to ID... ~  CT Abd&Pelvis 9/15> showed no acute abnormality & no venous obstruction or lymphocele to suggest a cause for left leg swelling; Cholelithiasis; unchanged bilateral adnexal cystic lesions (probably cysts); unchanged renal cysts and left renal atrophy; unchanged left perianal lipoma... ~  10/15: she is improved after the extended course of Keflex w/ decr erythema, swelling &  discomfort; she did not see ID; plan is to continue same meds, cleansing, elevation, no salt, support hose... ~  3/16: she was [facility] w/ right leg cellulitis- states she was out shopping & "over-did it", felt weak & tired then developed right leg discomfort, increased swelling/ redness/ etc; WBC was 21K, VenDopplers were neg for DVT, treated w/ Zosyn/ Vanco & changed to oral Doxy at disch;  She notes that her leg is improved- less swollen, less red, less painful but still sl tender; ~  07/2015: back to baseline... ~  02/2016: Recurrent redness/ swelling 01/2016- no better on Keflex from DrWert + topical wraps and creams from [place], Derm; we decided to treat w/ Pred10, incr Lasix80, no salt, Atarax for itching... ~  03/2016:  Much improved on the Pred10 + Lasix80 Qam; renal function is stable, the edema is diminished, and sed rate is now down to 15!  OK to wean Pred to '5mg'$  Qam...  HYPERCHOLESTEROLEMIA (ICD-272.0) - prev Rx'd by DrGegick... Now on ZOCOR '20mg'$ /d (prev Crestor5 & Lescol from [facility]) ~  she brought labs from [facility] 5/10 (off med due to $$$) w/ TChol 285, TG 204, HDL 69, LDL 195... "he was mad at me" ~  [facility] 5/11 [facility] showed TChol 202, TG 60, HDL 112, LDL 99 ~  She is reminded (again) to come FASTING for f/u FLP... ~  [place] 5/13 on Simva20 showed TChol 191, TG 81, HDL 82, LDL 93 ~  She is reminded to ret FASTING for f/u blood work...  HYPOTHYROIDISM (ICD-244.9) - prev followed by DrGegick on SYNTHROID 164mg- 1/2 daily... ~  [facility] w/ TSH= 2.90, FreeT4= 1.09 ~  labs 8/11 showed TSH= 1.87 ~  Labs 3/12 showed TSH= 2.81 ~  Labs 1/13 showed TSH= 1.65 ~  Labs 3/14 showed TSH= 4.28 ~  Labs 3/16 showed TSH= 1.21  GERD (ICD-530.81) - uses PRILOSEC '20mg'$ /d... last EGD was 6/06 by DrPatterson showing 3cmHH, reflux...   DIVERTICULOSIS OF COLON (ICD-562.10) & COLONIC POLYPS (ICD-211.3) - last colonoscopy  6/06 was WNL- no abnormality seen...  GALLSTONE and CHOLECYSTITIS  >>  ~  1/15: [name] was [facility] 1/13 - 10/11/13 w/ RUQ pain & dx w/ gallstones, cholecystitis, & had ERCP w/ sphincterotomy (DrStark) & stone extraction; treated w/ Cipro, LFTs improved, course complicated by LE cellulitis requiring addition of Vanco IV; she had f/u DrCornett, [facility] 2/15> brief note, did not rec elective surg given her age. ~  CT Abd&Pelvis 9/15> showed no acute abnormality & no venous obstruction or lymphocele to suggest a cause for left leg swelling; Cholelithiasis; unchanged bilateral adnexal cystic lesions (probably cysts); unchanged renal cysts and left renal atrophy; unchanged left perianal lipoma  RENAL INSUFFICIENCY (ICD-588.9) - tough balance betw renal perfusion & diuresis for edema... ~  labs 10/08 showed BUN= 27, Creat= 1.5 ~  labs 8/09 showed BUN= 44, Creat= 1.9 ~  labs 2/10 showed BUN 46, Creat= 1.7 ~  labs 7/10 showed BUN= 54, Creat= 2.0 ~  labs 11/10 showed BUN= 38, Creat= 1.8 ~  labs 8/11 showed BUN= 42, Creat= 2.1 ~  labs in ER 1/12 showed BUN=56, Creat=2.67, rec decr diuretics to Demadex'20mg'$ /d only. ~  Labs 3/12 showed improved renal function w/ BUN=31, Creat=1.4... ~  Labs 5/12 on Lasix80 showed BUN=45, Creat=1.8 ~  Labs 9/12 by DrDeveshwar showed BUN=33, Creat=1.47 ~  Labs 1/13 showed BUN=26, Creat=1.3, BNP=50... On Lasix '40mg'$  Qam. ~  Labs 5/13 showed BUN=28, Creat=1.2 ~  Labs 12/13 on Lasix80 showed BUN=30, Creat=1.5 ~  Labs 3/14 on Lasix80 showed BUN=31, Creat=1.5 ~  Labs 6/14 by DrDeveshwar on Lasix80 showed BUN=30, Cr=1.3 ~  Labs here 11/14 on Lasix80 showed BUN=31, Cr= 1.3 ~  She was [facility] 1/15 w/ cholecystitis & stones, s/p ERCP, and Creat- 1.1 - 1.7 ~  Labs 9/15 showed Cr= 0.94 to 1.4 range... ~  Labs 10/15 showed Cr= 1.3-1.4 ~  Labs 3/16 showed Cr= 1.52=>1.12 ~  Labs 7/16 showed BUN= 35, Cr= 1.13  DEGENERATIVE JOINT DISEASE (ICD-715.90) - this is her chief complaint- s/p right TKR in 1999,  left TKR 3/10... on MOBIC 7.'5mg'$  Prn & VICODIN Prn as  well... followed by DrYates; and had right second toe amp by Arnette Norris 2010 for hammertoe + spurs... also had epidural steroid shot from [facility] for LBP (without benefit she says)... ~  5/13:  She saw DrDeveshwar for Rheum w/ shot in hip & sl improved... 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 rec- start Vit D 50,000 u weekly... ~  labs 2/10 showed Vit D level = 30... rec- continue Vit D 50K weekly... ~  labs 7/10 showed Vit D level = 39... rec- change to 1000 u OTC daily. ~  labs 8/11 showed Vit D level = 38... continue same. ~  Labs 3/14 showed Vit D level = 43... Continue 1000u daily.  ANXIETY (ICD-300.00) - on ALPRAZOLAM 0.'25mg'$ Tid Prn... several deaths in the family... daughter who lives w/ her recently ill...  SHINGLES - she had right T9-10 shingles in 2011 & resolved w/ Valtrex, Pred, etc...   Past Surgical History:  Procedure Laterality Date  . ABDOMINAL HYSTERECTOMY    . BREAST BIOPSY     Benign  . CATARACT EXTRACTION    . ERCP N/A 10/06/2013   Procedure: ENDOSCOPIC RETROGRADE CHOLANGIOPANCREATOGRAPHY (ERCP);  Surgeon: Ladene Artist, MD;  Location: [facility];  Service: Endoscopy;  Laterality:  N/A;  . TOE AMPUTATION  08/2009   Right second toe - Dr Beola Cord  . TOTAL KNEE ARTHROPLASTY  1999   right  . TOTAL KNEE ARTHROPLASTY  11/2008   left - Dr Lorin Mercy    Outpatient Encounter Prescriptions as of 04/18/2016  Medication Sig Dispense Refill  . acetaminophen (TYLENOL) 325 MG tablet Take 325 mg by mouth every 4 (four) hours as needed for mild pain or moderate pain.     [name] allopurinol (ZYLOPRIM) 300 MG tablet Take 1 tablet by mouth daily.    [name] ALPRAZolam (XANAX) 0.25 MG tablet Take 1/2 to 1 tablet by mouth 3 times daily as needed for anxiety 90 tablet 5  . Ascorbic Acid (VITAMIN C) 500 MG tablet Take 500 mg by mouth daily.      [name] aspirin 81 MG tablet Take 81 mg by  mouth daily.      . Cholecalciferol (VITAMIN D) 1000 UNITS capsule Take 1,000 Units by mouth daily.      [name] diltiazem (CARDIZEM CD) 180 MG 24 hr capsule TAKE ONE CAPSULE BY MOUTH EVERY DAY 90 capsule 3  . ferrous sulfate 325 (65 FE) MG tablet Take 1 tablet (325 mg total) by mouth 2 (two) times daily with a meal. 60 tablet 2  . folic acid (FOLVITE) 1 MG tablet Take 1 tablet (1 mg total) by mouth daily. 30 tablet 1  . furosemide (LASIX) 40 MG tablet Take 2 tablets every AM 60 tablet 3  . hydrOXYzine (ATARAX/VISTARIL) 25 MG tablet Take 1 tablet (25 mg total) by mouth every 6 (six) hours as needed. 50 tablet 0  . levothyroxine (SYNTHROID, LEVOTHROID) 125 MCG tablet TAKE 1/2 TABLET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

## 2016-04-19 NOTE — Progress Notes (Signed)
Subjective:    Patient ID: [name], female    DOB: 15-Mar-1915, 82 y.o.   MRN: [id number]  HPI 82 y/o WF here for a follow up visit... she has mult med problems including:  Hx asthmatic bronchitis;  HBP;  Ven Insuffic & edema;  Hyperchol;  Hypothyroid;  GERD/ Divertics/ Colon polyps;  Renal insuffic;  DJD;  Vit D defic;  Anxiety...  ~  SEE PREV EPIC NOTES FOR OLDER DATA >>    LABS 11/14:  Chems- ok w/ BS=108, Cr=1.3;  Uric=4.3 on Allopurinol100;  CBC- ok w/ Hg=12.6.Marland KitchenMarland Kitchen  December 15, 2013:  [facility] & [facility] w/ RUQ pain & dx w/ gallstones, cholecystitis, & had ERCP w/ sphincterotomy (DrStark) & stone extraction; treated w/ Cipro, LFTs improved, course complicated by LE cellulitis requiring addition of Vanco IV; she had f/u DrCornett, CCS 2/15> brief note, did not rec elective surg given her age...  June 14, 2014[facility] by [facility] w/ sudden fever to 103, chills, and left leg pain/ redness/ swelling=> c/w severe cellulitis; she was felt to be septic but no lesions on leg, no areas of broken skin, wearing support hose, her Lasix dose was cut from 869mam to 4071m, other meds the same...   CXR 9/15 showed borderline cardiomeg, underlying chr lung dis, left base atx vs effusion, ?early RUL opac...   EKG 9/15 showed NSR w/ PACs, rate92, NSSTTWA, NAD...  MRI of left leg 9/15> diffuse SQ soft tissue swelling & edema, no discrete abscess identified, no osteomyelitis, no signs of septic arthritis, etc (c/w severe cellulitis).  Ven Dopplers 9/15> no evid of DVT or superficial thrombosis in left leg, enlarged LN's noted in left groin  LABS 9/15 in Hosp> Hg=10-11 range, WBC=29K=>11.7 by disch, BUN/Cr= 39/1.3 improved to 27/0.9 by disch, MRSA=neg, UA=clear...  LABS 9/15 in office f/u>  Chems- ok x Na=131, Cr=1.4;  CBC- ok x   CT Abd&Pelvis 9/15> showed no acute abnormality & no venous obstruction or lymphocele to suggest a cause for left leg  swelling; Cholelithiasis; unchanged bilateral adnexal cystic lesions (probably cysts); unchanged renal cysts and left renal atrophy; unchanged left perianal lipoma...  CXR 10/15 showed clearing of patchy RUL opac & left effusion, lungs now clear & back to baseline, NAD...  LABS 101/15:  Chems- ok x Na=132, BUN=33, Cr=1.3;  CBC- ok w/ Hg=12.0, WBC=7.6;  Sed=54 (improved from 90)...  ~  December 06, 2014:  66mo60mo & [name] w/ right leg cellulitis- states she was out shopping & "over-did it", felt weak & tired then developed right leg discomfort, increased swelling/ redness/ etc; WBC was 21K, VenDopplers were neg for DVT, treated w/ Zosyn/ Vanco & changed to oral Doxy at disch;  She notes that her leg is improved- less swollen, less red, less painful but still sl tender; she notes that right leg first started giving her trouble after the birth of her last child; she had thorough vasc eval by DrEarly in the past and no surgery indicated; she knows to elim salt/sodium, elev legs, wear support hose; she has finished the Doxy100Bid given at disch & she misunderstood the DC directions and has been taking her prev Lasix80mg63m... We reviewed the following medical problems during today's office visit >> NOTE: as an afterthought she noted vag itching & wanted Rx- offered Diflucan orally & if not improved she will need Gyn eval (she  has Atarax as well on her med list)...     HBP> on Diltiazem180, Furosemide 19mQam, & off KCl since disch;  BP=134/62 today & she denies CP, palpit, dizzy, ch in SOB, etc...    VI, Edema, Cellulitis> she knows to elim sodium, elevate, wear support hose; still taking Lasix80Qam; Hosp 3/16 as above & disch on Doxy- now out & right leg improved she says; RLE chr larger than LLE, sl red/ tender; Rec decr Lasix40.     CHOL> on Simva20 Qhs + low fat diet; last FLP 5/13 showed TChol 191, TG 81, HDL 82, LDL 93... Needs to ret fasting for f/u  blood work.    Hypothy> on Synthroid 1224m- 1/2 tab daily; labs 3/16 showed TSH= 1.21    Renal Insuffic> Creat prev up to 1.8 on the Lasix 8014mCreat 3/16= 1.52=>1.12    Derm> she has Psoriasis on her arms & has been evaluated by DrJordan/Jones; they also treated the LE cellulitis; Rec Atarax prn itching...    Anemia> Hosp 1/15 w/ gallstones and cholecystits (no surg); Hg= 7.5-9.4 at that time & Hg improved to 12.7; labs 3/16 showed Hg= 13.2=>10.6, Fe=13 (7%sat), Ferritin=104, on FeSO4... We reviewed prob list, meds, xrays and labs> see below for updates >>   CXR 3/16 showed norm heart size, mild left base atx, otherw OK...   EKG 3/16 showed NSR, rate86, PACs, poor r prog V1-2, NAD... Marland KitchenMarland KitchenVen Dopplers 3/16 were neg for DVT...   2DEcho 3/16 showed mild LVH w/ mild focal basal septal hypertrophy, norm LVF w/ EF=60-65%, norm wall motion, Gr1DD, norm AoV, mild MR, mild LA dil, norm RV, PAsys=38...  LABS 3.16:  Chems- ok x BUN=37 due to Lasix80 she was on & we decr her to 50m74m CBC- wnl,  Sed=65;  PCT<0.10 indicating +inflamm but no infection now...  PLAN>> we decided to check f/u labs (BMet, CBC, Sed, PCT); Rec to apply cool compresses to right leg several times daily, elim salt/sodium, elev legs, wear support hose; recurrent cellulitis episodes may require suppressive antibiotic therapy... Labs ret w/ BUN=37 7 we decr her Lasix from 80=>40/d; elev Sed w/ neg PCT & we are holding any further antibiotics for now...  ~  April 05, 2015:  23mo 81mo& [name] is orthopedic w/ hip and back pain & she tells me that DrNewton has diagnosed Gout, Rx Allopurinol300- we do not have notes from him; Recall prev evals from Rheum- DrDeveshwar, Dx w/ Gout tophi (on Allopurinol), severe OA in hands and hips, s/p bilat TKRs- on Allopurinol, Tramadol (refilled), Tylenol... We reviewed the following medical problems during today's office visit >>     HBP> on Diltiazem180, Furosemide 50mgQ71m& KCl Bid;  BP=120/70  today & she denies CP, palpit, dizzy, ch in SOB, etc...    VI, Edema, Cellulitis> she knows to elim sodium, elevate, wear support hose; still taking Lasix40Qam; Hosp 3/16 as above & disch on Doxy- right leg improved she says; RLE chr larger than LLE, sl red/ tender; Rec continue Lasix40.     CHOL> on Simva20 Qhs + low fat diet; last FLP 5/13 showed TChol 191, TG 81, HDL 82, LDL 93... Needs to ret fasting for f/u blood work.    Hypothy> on Synthroid 125mcg-59m tab daily; labs 3/16 showed TSH= 1.21    Renal Insuffic> Creat prev up to 1.8 on Lasix 80mg; C61m 3/16= 1.52=>1.12; Labs 7/16 stable w/ BUN=35, Cr=1.13    Hx severe DJD, Gout w/ tophi, s/p bilat  TKRs> prev followed by Ladene Artist; on Allopurinol, Tramadol, Tylenol...    Derm> she has Psoriasis on her arms & has been evaluated by DrJordan/Jones; they also treated the LE cellulitis; Rec Atarax prn itching...    Anemia> Hosp 1/15 w/ gallstones and cholecystits (no surg); Hg= 7.5-9.4 at that time & Hg improved to 12.7; labs 7/16 showed Hg= 12.1, Fe=102 (32%sat), Ferritin=116, on FeSO4/d... We reviewed prob list, meds, xrays and labs> see below for updates >>   LABS 7/16:  Chems- ok w/ BUN=35, Cr=1.13, BS=100, BNP=268;  CBC- ok w/ Hg=12.1, Fe=102 (32%), Ferritin=116...  ~  August 07, 2015:  83moROV & post-ER follow up visit> [place] c/o HA rated 8/10> frontal HA, assoc n/v, felt weak & unsteady; BP was ok, VS were stable, Labs ok, CT Head was neg- wnl; Tylenol helped... we reviewed the following medical problems during today's office visit >>     HBP> on Diltiazem180, Furosemide 465mam, & KCl Bid;  BP=124/60 today & she denies CP, palpit, dizzy, ch in SOB, etc...    VI, Edema, Hx cellulitis> she knows to elim sodium, elevate, wear support hose; on Las[facility]40Qam; [facility] on Doxy- right leg improved; RLE chr larger than LLE, sl red/ tender; Rec continue Lasix40.     CHOL> on Simva20 Qhs + low fat diet; last FLP 5/13  showed TChol 191, TG 81, HDL 82, LDL 93... Needs to ret fasting for f/u blood work.    Hypothy> on Synthroid 12566m 1/2 tab daily; labs 3/16 showed TSH= 1.21    Renal Insuffic> Creat prev up to 1.8 on Lasix 28m5mreat 3/16= 1.52=>1.12; Labs 7/16 stable w/ BUN=35, Cr=1.13    Hx severe DJD, Gout w/ tophi, s/p bilat TKRs> prev followed by DrDeveshwar; on Allopurinol, Tramadol, Tylenol...    Derm> she has Psoriasis on her arms & has been evaluated by DrJordan/Jones; they also treated the LE cellulitis; Rec Atarax prn itching...    Anemia> Hosp 1/15 w/ gallstones and cholecystits (no surg); Hg= 7.5-9.4 at that time & Hg improved to 12.7; labs 7/16 showed Hg= 12.1, Fe=102 (32%sat), Ferritin=116, on FeSO4/d...    Anxiety>  On Xanax 0.25- 1/2 to 1 tab tid prn... EXAM shows Afeb, VSS, O2sat=96% on RA:  Heent- neg, mallampati1;  Chest- clear w/o w/r/r;  Heart- RR gr1/6SEM w/o r/g;  Abd- soft, nontender, neg;  Ext- VI, 1+edema, ecchymoses;  Neuro- no focal neuro deficits...   CT Head 07/20/15>  Intact, WNL, NAD...  Labs 06/2015>  Chems- wnl;  CBC- wnl... IMP/PLAN>>  OK Flu vaccine today;  Continue same meds....  ~  February 28, 2016:  64mo 69mo& Legacie was [facility] by MW 5/[facility] w/ ?cellulitis right leg, she had been seeing Derm & Rx w/ creams but no better; she was Afeb, WBC=8.4K, Sed=44; he prescribed Keflex but no better she says & pt has followed up w/ Derm=> legs wrapped Qwk now, swelling worse, wt up 7# to 142# today => this time Rx w/ Pred10, Atarax, incr Lasix80 & no salt...    HBP> on Diltiazem180, Furosemide 40mgQ53m& KCl 10Bid;  BP=120/60 today & she denies CP, palpit, dizzy, ch in SOB, etc...    VI, Edema, Hx cellulitis> she knows to elim sodium, elevate, wear support hose; on Lasix40Qam; Hosp 3/16 & disch on Doxy; Hx cellulitis & non-infection inflammation treated w/ diff antibiotics and Pred, Lasix, no salt, wraps & creams from Derm DScherrie Merrittsisits, and us...KoreaMarland KitchenMarland Kitchen  CHOL> on Simva20 Qhs + low fat  diet; last FLP 5/13 showed TChol 191, TG 81, HDL 82, LDL 93... Needs to ret fasting for f/u blood work.    Hypothy> on Synthroid 141mg- 1/2 tab daily; labs 3/16 showed TSH= 1.21    Renal Insuffic> Creat prev up to 1.8 on Lasix 883m Creat 3/16= 1.52=>1.12; Labs 2016 stable w/ BUN=23-37, Cr=1.13-1.28; Labs 5/17 showed BUN=25, Cr=1.33    Hx severe DJD, Gout w/ tophi, s/p bilat TKRs> prev followed by DrDeveshwar; on Allopurinol, Tramadol, Tylenol...    Derm> she has Psoriasis on her arms & has been evaluated by DrJordan/Jones; they also treated the LE cellulitis/ inflamm w/ wraps and creams, Atarax prn itching...    Anemia> Hosp 1/15 w/ gallstones and cholecystits (no surg); Hg= 7.5-9.4 at that time & Hg improved to 12.7; labs 7/16 showed Hg= 12.1, Fe=102 (32%sat), Ferritin=116, on FeSO4/d; labs 5/16 showed Hg=11.8    Anxiety>  On Xanax 0.25- 1/2 to 1 tab tid prn... EXAM shows Afeb, VSS, O2sat=98% on RA:  Heent- neg, mallampati1;  Chest- decr BS w/o w/r/r;  Heart- RR gr1/6SEM w/o r/g;  Abd- soft, nontender, neg;  Ext- VI, +edema to above knees, leg wrapped;  Neuro- intact... IMP/PLAN>>  Not better w/ Keflex, looks more inflammed than infected=> Rx w/ Pred10, Atarax25 prn, incr Lasix40-2Qam & no salt, ROV in 2wks...   ~  April 03, 2016:  [place] [facility]16 c/o chest congestion, wheezing, cough, sl yellow sput, ?low grade fever; [facility]; CXR showed diffuse peribronchial cuffing, no airsp dis; given Augmentin, bump Pred, Mucinex;  She states "I was never that sick before" but breathing is fine now & she denies CP/ change is SOB/ cough/ sputum, f/c/s, etc;  But while her breathing is improved- her left leg is swollen & painful again- NOTE: she ran out of Pred 1025m 2 weeks ago!  We decided to check labs>  Chems- ok x BUN=27, Cr=1.51;  CBC- anemic w/ Hg=9.9, MCV=98, WBC=7.8; Sed=70 (up from 44);  Rx w/ Lasix80 & Pred10m57magain; ROV in 2-3wks and continue follow up  w/ Derm...           Problem List:  GLAUCOMA (ICD-365.9) - hx mult eye problems w/ prev corneal transplant & glaucoma laser eye surg... on eye drops per [facility]hthalmology at [facility] ASTHMATIC BRONCHITIS, ACUTE (ICD-466.0) - Hx AB requiring Rx w/ Depo/ Pred/ Mucinex/ Tussionex... ~  CXR 4/11 showed vague nodular densities on left... ~  [facility]11[facility]  [facility], & dx w/ pneumonia rx w/ ZPak... ~  CXR 3/12 showed borderline cardiomeg, nodular opac left base, NAD... ~  Spring 2013: she notes some allergy symptoms... ~  CXR 1/15 showed mild cardiomeg, atherosclerotic & tortuous Ao, low lung vol & sl incr markings, NAD...  [name]  CXR 9/15 showed borderline cardiomeg, underlying chr lung dis, left base atx vs effusion, ?early RUL opac... ~  CXR 10/15 showed clearing of patchy RUL opac & left effusion, lungs now clear & back to baseline, NAD ~  CXR 3/16 in hosp showed norm heart size, mild left base atx, otherw OK...   HYPERTENSION (ICD-401.9) - controlled on ASA 81mg81m CARTIAXT 180mg/70mASIX 40mg- 81mQam... ~  7/10:  labs showed BUN=54, Creat=2.0 & rec to decr diuretics in half= one Demedex & 1/2 Aldactone. ~  11/10:  labs showed BUN=38, Creat=1.8 & rec to  keep same meds. ~  8/11:  labs showed BUN=42, Creat=2.1, K=4.3 ~  1/12:  labs in ER showed BUN=56, Creat=2.67, therefore diuretics decr to Demadex20/d only. ~  3/12:  2DEcho showed mod LVH, norm sys function w/ EF=60-65% & no regional wall motion abn; Gr1DD, mibile density on AoV- prob lambl'e escrescence, mildMR. ~  5/12:  f/u labs in office showed BUN=45, Creat=1.8, on Lasix 10m Qam for her edema... ~  9/12:  [facility] showed BUN=33, Creat=1.47, edema resolved therefore decr Lasix to 441md. ~  1/13:  BP= 160/76 but hasn't taken meds today; labs improved w/ BUN=26, Creat=1.3, BNP=50 ~  5/13:  BP= 140/78 & denies CP, palpit, SOB, edema; labs improved w/ BUN=28, Creat=1.2 ~  9/13:  BP= 140/60 & she  denies CP, palpt, ch in SOB or edema... ~  12/13: controlled on Diltiazem180 & Furosemide 4027mQam w/ BP=138/62 today & she denies CP, palpit, dizzy, ch inSOB, etc... ~  3/14:  BP= 130/66 on same meds; Labs stable as well w/ Cr=1.5, continue same... ~  7/14: on Diltiazem180 & Furosemide 11m31mam w/ BP=150/64 today & she denies CP, palpit, dizzy, ch in SOB, etc. ~  11/14: BP= 126/64 on same meds, stable... ~  EKG 1/15 showed NSR, rate92, NSSTTWA, NAD... ~  3/15: on Diltiazem180 & Furosemide 11mg63mm w/ BP=118/70 today & she denies CP, palpit, dizzy, ch in SOB, etc. ~  9/15: post hosp> off Diltiazem now & still on Furosemide 11mg-43mQam & K10-2/d w/ BP=146/60 w/ leg swelling, edema, cellulitis as below... ~  1/16: on Diltiazem180, Furosemide 11mg-271m & K10-2/d;  BP=132/64 today & she denies CP, palpit, dizzy, ch in SOB, etc. ~  3/16: on Diltiazem180, Furosemide 11mgQam49moff KCl since disch;  BP=134/62 today & she denies CP, palpit, dizzy, ch in SOB, etc ~  7/16: on Diltiazem180, Furosemide 11mgQam,15mCl Bid;  BP=120/70 & she remains asymptomatic...  PALPITATIONS, HX OF (ICD-V12.50) ~  3/16:  EKG showed NSR, rate86, PACs, poor r prog V1-2, NAD...  VENOUS INSUFFICIENCY/ EDEMA >> swelling controlled w/ diuretics, low sodium diet, elevation, support hose, etc... RIGHT LEG SWELLING > LEFT LEG... Pt notes it's been like this since her last child was born... Hx RECURRENT CELLULITIS >>  ~  She saw DrEarly [facility]- there was nothing he could do... ~  Swelling diminished w/ low sodium, elevation, support hose, & Lasix40 1-2tabs daily... ~  Nov-Dec2013: she knows to elim sodium, elevate, wear support hose; she has been seen by [facility], DrEarly & there is nothing they can do; recently checked by Derm- DrJPayton Mccallum w/ patch dressing & support hose; edema decr w/ Lasix 80mg/d; L73moday shows Creat=1.5... ~  7/14[name]  she knows to elim sodium, elevate, wear support hose, & the Lasix80; weight is down 10# today to 141#  w/ resolution of edema; [name] (Derm) treated dermatitis w/ new cream & improved. ~  11/14: wt is back up 8# to 149# today & she needs to elim sodium, elev legs, etc... ~  3/15: she knows to elim sodium, elevate, wear support hose, & the Lasix80; weight is back down 9# today to 137#... ~  9//1[name]: [facility] w/ 4+edema etc; VenDopplers neg for DVT; no open wounds/ drainage; treated w/ IV antibiotics and disch on Keflex; has persistent erythema, pain, swelling & we decided to continue the Keflex & refer to ID... ~  CT Abd&Pelvis 9/15> showed no acute abnormality & no venous obstruction or lymphocele  to suggest a cause for left leg swelling; Cholelithiasis; unchanged bilateral adnexal cystic lesions (probably cysts); unchanged renal cysts and left renal atrophy; unchanged left perianal lipoma... ~  10/15: she is improved after the extended course of Keflex w/ decr erythema, swelling & discomfort; she did not see ID; plan is to continue same meds, cleansing, elevation, no salt, support hose... ~  3/16: she was Adm w/ right leg cellulitis- states she was out shopping & "over-did it", felt weak & tired then developed right leg discomfort, increased swelling/ redness/ etc; WBC was 21K, VenDopplers were neg for DVT, treated w/ Zosyn/ Vanco & changed to oral Doxy at disch;  She notes that her leg is improved- less swollen, less red, less painful but still sl tender; ~  07/2015: back to baseline... ~  02/2016: Recurrent redness/ swelling 01/2016- no better on Keflex from DrWert + topical wraps and creams from [place], Derm; we decided to treat w/ Pred10, incr Lasix80, no salt, Atarax for itching...  HYPERCHOLESTEROLEMIA (ICD-272.0) - prev Rx'd by DrGegick... Now on ZOCOR 107m/d ([facility]5 & [facility] ~  [facility]off [facility] due to $$$) w/ TChol 285, TG 204, HDL 69, LDL 195... "he was mad at me" ~  [facility], TG 60, HDL  112, LDL 99 ~  She is reminded (again) to come FASTING for f/u FLP... ~  [place] Simva20 showed TChol 191, TG 81, HDL 82, LDL 93 ~  She is reminded to ret FASTING for f/u blood work...  HYPOTHYROIDISM (ICD-244.9) - prev followed by DrGegick on SYNTHROID 1242m- 1/2 daily... ~  [facility] w/ TSH= 2.90, FreeT4= 1.09 ~  labs 8/11 showed TSH= 1.87 ~  Labs 3/12 showed TSH= 2.81 ~  Labs 1/13 showed TSH= 1.65 ~  Labs 3/14 showed TSH= 4.28 ~  Labs 3/16 showed TSH= 1.21  GERD (ICD-530.81) - uses PRILOSEC 205m... last EGD was 6/06 by DrPatterson showing 3cmHH, reflux...   DIVERTICULOSIS OF COLON (ICD-562.10) & COLONIC POLYPS (ICD-211.3) - last colonoscopy 6/06 was WNL- no abnormality seen...  GALLSTONE and CHOLECYSTITIS >>  ~  [facility] w/ RUQ pain & dx w/ gallstones, cholecystitis, & had ERCP w/ sphincterotomy (DrStark) & stone extraction; treated w/ Cipro, LFTs improved, course complicated by LE cellulitis requiring addition of Vanco IV; she had f/u DrCornett, CCS 2/15> brief note, did not rec elective surg given her age. ~  CT Abd&Pelvis 9/15> showed no acute abnormality & no venous obstruction or lymphocele to suggest a cause for left leg swelling; Cholelithiasis; unchanged bilateral adnexal cystic lesions (probably cysts); unchanged renal cysts and left renal atrophy; unchanged left perianal lipoma  RENAL INSUFFICIENCY (ICD-588.9) - tough balance betw renal perfusion & diuresis for edema... ~  labs 10/08 showed BUN= 27, Creat= 1.5 ~  labs 8/09 showed BUN= 44, Creat= 1.9 ~  labs 2/10 showed BUN 46, Creat= 1.7 ~  labs 7/10 showed BUN= 54, Creat= 2.0 ~  labs 11/10 showed BUN= 38, Creat= 1.8 ~  labs 8/11 showed BUN= 42, Creat= 2.1 ~  labs in ER 1/12 showed BUN=56, Creat=2.67, rec decr diuretics to Demadex20m67monly. ~  Labs 3/12 showed improved renal function w/ BUN=31, Creat=1.4... ~  Labs 5/12 on Lasix80 showed BUN=45, Creat=1.8 ~  Labs  9/12 by DrDeveshwar showed BUN=33, Creat=1.47 ~  Labs 1/13 showed BUN=26, Creat=1.3, BNP=50... On Lasix 40mg78m. ~  Labs 5/13  showed BUN=28, Creat=1.2 ~  Labs 12/13 on Lasix80 showed BUN=30, Creat=1.5 ~  Labs 3/14 on Lasix80 showed BUN=31, Creat=1.5 ~  Labs 6/14 by DrDeveshwar on Lasix80 showed BUN=30, Cr=1.3 ~  Labs here 11/14 on Lasix80 showed BUN=31, Cr= 1.3 ~  She was [facility]15 w/ cholecystitis & stones, s/p ERCP, and Creat- 1.1 - 1.7 ~  Labs 9/15 showed Cr= 0.94 to 1.4 range... ~  Labs 10/15 showed Cr= 1.3-1.4 ~  Labs 3/16 showed Cr= 1.52=>1.12 ~  Labs 7/16 showed BUN= 35, Cr= 1.13  DEGENERATIVE JOINT DISEASE (ICD-715.90) - this is her chief complaint- s/p right TKR in 1999,  left TKR 3/10... on MOBIC 7.86m Prn & VICODIN Prn as well... followed by DrYates; and had right second toe amp by DArnette Norris2010 for hammertoe + spurs... [facility] says)... ~  5/13:  She saw DrDeveshwar for Rheum w/ shot in hip & sl improved... 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 rec- start Vit D 50,000 u weekly... ~  labs 2/10 showed Vit D level = 30... rec- continue Vit D 50K weekly... ~  labs 7/10 showed Vit D level = 39... rec- change to 1000 u OTC daily. ~  labs 8/11 showed Vit D level = 38... continue same. ~  Labs 3/14 showed Vit D level = 43... Continue 1000u daily.  ANXIETY (ICD-300.00) - on ALPRAZOLAM 0.263mid Prn... several deaths in the family... daughter who lives w/ her recently ill...  SHINGLES - she had right T9-10 shingles in 2011 & resolved w/ Valtrex, Pred, etc...   Past Surgical History:  Procedure Laterality Date  . ABDOMINAL HYSTERECTOMY    . BREAST BIOPSY     Benign  . CATARACT EXTRACTION    . ERCP N/A 10/06/2013   Procedure: ENDOSCOPIC RETROGRADE CHOLANGIOPANCREATOGRAPHY (ERCP);  Surgeon: MaLadene ArtistMD;  [facility];  Laterality: N/A;  . TOE AMPUTATION  08/2009   Right second toe - Dr BeBeola Cord. TOTAL KNEE ARTHROPLASTY  1999   right  . TOTAL KNEE ARTHROPLASTY  11/2008   left - Dr YaLorin [facility]  Outpatient Encounter Prescriptions as of 04/03/2016  Medication Sig Dispense Refill  . Ascorbic Acid (VITAMIN C) 500 MG tablet Take 500 mg by mouth daily.      . Marland Kitchenspirin 81 MG tablet Take 81 mg by mouth daily.      . Cholecalciferol (VITAMIN D) 1000 UNITS capsule Take 1,000 Units by mouth daily.      . Marland Kitcheniltiazem (CARDIZEM CD) 180 MG 24 hr capsule TAKE ONE CAPSULE BY MOUTH EVERY DAY 90 capsule 3  . ferrous sulfate 325 (65 FE) MG tablet Take 1 tablet (325 mg total) by mouth 2 (two) times daily with a meal. 60 tablet 2  . folic acid (FOLVITE) 1 MG tablet Take 1 tablet (1 mg total) by mouth daily. 30 tablet 1  . furosemide (LASIX) 40 MG tablet Take 2 tablets every AM 60 tablet 3  . hydrOXYzine (ATARAX/VISTARIL) 25 MG tablet Take 1 tablet (25 mg total) by mouth every 6 (six) hours as needed. 50 tablet 0  . levothyroxine (SYNTHROID, LEVOTHROID) 125 MCG tablet TAKE 1/2 TABLET BY MOUTH DAILY BEFORE BREAKFAST 90 tablet 3  . meclizine (ANTIVERT) 25 MG tablet Take 1/2-1 tablet  by mouth every 4 hours as needed for dizziness 50 tablet 0  . Multiple Vitamin (MULTIVITAMIN WITH MINERALS) TABS tablet Take 1 tablet by mouth daily. 30 tablet 0  . mupirocin ointment (BACTROBAN) 2 % 1 APPLICATION APPLY ON THE SKIN AS DIRECTED APPLY TO AFFECTED AREA ON HAND AND COVER AS DIRECTED  0  . Omega-3 Fatty Acids (FISH OIL) 1000 MG CAPS Take 1 capsule by mouth daily.    [name] omeprazole (PRILOSEC) 20 MG capsule Take 20 mg by mouth daily.      Vladimir Faster Glycol-Propyl Glycol (SYSTANE) 0.4-0.3 % SOLN Place 1 drop into both eyes 4 (four) times daily.     . potassium chloride (KLOR-CON M10) 10 MEQ tablet TAKE 2 TABLETS (20 MEQ TOTAL) BY MOUTH DAILY. 180 tablet 3  . predniSONE (DELTASONE) 10 MG tablet Take  1 tablet (10 mg total) by mouth daily with breakfast. 50 tablet 1  . simvastatin (ZOCOR) 20 MG tablet TAKE 1 TABLET (20 MG TOTAL) BY MOUTH DAILY. 90 tablet 3  . thiamine 100 MG tablet Take 1 tablet (100 mg total) by mouth daily. (Patient not taking: Reported on 04/18/2016) 30 tablet 1  . traMADol (ULTRAM) 50 MG tablet Take 1 tablet (50 mg total) by mouth every 8 (eight) hours as needed. 90 tablet 0  . [DISCONTINUED] levothyroxine (SYNTHROID, LEVOTHROID) 125 MCG tablet TAKE 1/2 TABLET BY MOUTH DAILY BEFORE BREAKFAST 90 tablet 3  . [DISCONTINUED] predniSONE (DELTASONE) 10 MG tablet Take 1 tablet (10 mg total) by mouth daily with breakfast. 30 tablet 3  . [DISCONTINUED] simvastatin (ZOCOR) 20 MG tablet TAKE 1 TABLET (20 MG TOTAL) BY MOUTH DAILY. 90 tablet 1  . [DISCONTINUED] traMADol (ULTRAM) 50 MG tablet Take 1 tablet (50 mg total) by mouth every 8 (eight) hours as needed. 90 tablet 0  . acetaminophen (TYLENOL) 325 MG tablet Take 325 mg by mouth every 4 (four) hours as needed for mild pain or moderate pain.     [name] allopurinol (ZYLOPRIM) 300 MG tablet Take 1 tablet by mouth daily.    [name] ALPRAZolam (XANAX) 0.25 MG tablet Take 1/2 to 1 tablet by mouth 3 times daily as needed for anxiety 90 tablet 5  . loperamide (IMODIUM) 2 MG capsule Take 1 capsule (2 mg total) by mouth as needed for diarrhea or loose stools. (Patient not taking: Reported on 04/03/2016) 10 capsule 0  . ondansetron (ZOFRAN) 8 MG tablet Take 1 tablet (8 mg total) by mouth every 6 (six) hours as needed for nausea or vomiting. (Patient not taking: Reported on 04/03/2016) 20 tablet 0  . [DISCONTINUED] ALPRAZolam (XANAX) 0.25 MG tablet TAKE 1/2-1 TABLET BY MOUTH 3 TIMES A DAY AS NEEDED FOR ANXIETY 90 tablet 3   No facility-administered encounter medications on file as of 04/03/2016.     Allergies  Allergen Reactions  . Enablex [Darifenacin Hydrobromide Er] Other (See Comments)    Caused her throat and mouth to have bumps and feel like it was  swelling  . Clarithromycin Swelling    REACTION: causes her mouth to swell  . Codeine Nausea Only  . Morphine Nausea And Vomiting  . Sulfonamide Derivatives Other (See Comments)    REACTION: unsure of reaction    Current Medications, Allergies, Past Medical History, Past Surgical History, Family History, and Social History were reviewed in [employer] record.    Review of Systems        See HPI - all other systems neg except as noted...  The  patient complains of dyspnea on exertion, peripheral edema, muscle weakness, and difficulty walking.  The patient denies anorexia, fever, weight loss, weight gain, vision loss, decreased hearing, hoarseness, chest pain, syncope, prolonged cough, headaches, hemoptysis, abdominal pain, melena, hematochezia, severe indigestion/heartburn, hematuria, incontinence, genital sores, suspicious skin lesions, transient blindness, depression, unusual weight change, abnormal bleeding, enlarged lymph nodes, and angioedema.     Objective:   Physical Exam      WD, WN, 82 y/o WF in NAD... GENERAL:  Alert & oriented; pleasant & cooperative... HEENT:  Falls/AT, EOM- full, EACs-clear, TMs-wnl, NOSE-clear, THROAT-clear & wnl. NECK:  Supple w/ fairROM; no JVD; normal carotid impulses w/o bruits; no thyromegaly or nodules palpated; no lymphadenopathy. CHEST:  Clear, decr BS bilat w/o wheezing, rales, or rhonchi heard... HEART:  Regular Rhythm;  gr 1/6 SEM without rubs or gallops detected... ABDOMEN:  Soft & nontender; normal bowel sounds; no organomegaly or masses detected. EXT:  moderate arthritic changes; s/p bilat TKRs; mild varicose veins/ +venous insuffic/ 2+edema Left leg, sl red/ sl tender... s/p right second toe distal amputation... NEURO:  CN's intact;  no focal neuro deficits... DERM:  No lesions noted; no rash etc...  RADIOLOGY DATA:  Reviewed in the EPIC EMR & discussed w/ the patient...  LABORATORY DATA:  Reviewed in the EPIC EMR &  discussed w/ the patient...   Assessment & Plan:    Hx Left LEG CELLULITIS, now Right LEG CELLULITIS>> ?Etiology (NOS), no broken skin or draining areas; treated empirically in [facility] 1/15 & disch on Keflex w/ persistent red/ swollen/ tender left leg; we continued the Keflex for 2wks longer & wanted ID consult; she never got the consult but improved on the Keflex; now off the antibiotic w/ decr swelling, erythema, discomfort; REC to continue meds, elevation, no salt, support hose, etc...  12/15> she reports left leg red, incr swelling, & drainage- went to Derm, DrJordan & given cream which she reports has helped... 3/16> she was hosp w/ right leg cellulitis- swollen, red, inflammed, tender- but again NOS, no broken skin, etc; treated w/ Zosyn/ Vanco & disch on Doxy=> improved... 7/16> stable w/o recurrent soft tissue infection 11//16> at baseline... 6/17> Recurrent redness/ swelling 01/2016- no better on Keflex from DrWert + topical wraps and creams from [place], [facility]; we decided to treat w/ Pred10, incr Lasix80, no salt, Atarax for itching   URI/ Bronchitis> she presented 1/16 w/ sore throat, cough, yellow sput & we decided to treat w/ Levaquin x5d, Depo80, Pred dosepak; she may also use Mucinex, fluids, & MMW if needed...  HBP>  Controlled on meds, continue same including the Lasix40Qam...  Cardiac>  Prev DrRoss' note reviewed; see 2DEcho, Event Monitor reports no dangerous arrhythmias, continue meds...  Ven Insuffic/ EDEMA- improved>  evals by Cards & VVS> "there is nothing they can do"; continue elevation, no salt, Lasix80 now, compression...  CHOL>  On Simva20 & FLP 5/13 looked good...  HYPOTHYROID>  Continue Synthroid 169mg/d taking 1/2 tab daily...  GI>  Stable now s/p ERCP for gallstones; followed by DrStark now... Episode of cholecystitis, gallstones 1/15- s/p ERCP & improved w/o surg (DrStark, DrCornett)...  Renal Insuffic>  Careful w/ diuresis, NSAIDs etc; Creat varied 1.1 to  1.7  DJD>  She saw DrDeveshwar for her Rheum complaints==> shot in knee; DrYates/ NErnestina Patcheshave signed off; may need pain clinic....  Other problems as noted...   Patient's Medications  New Prescriptions   No medications on file  Previous Medications   ACETAMINOPHEN (TYLENOL) 325 MG TABLET  Take 325 mg by mouth every 4 (four) hours as needed for mild pain or moderate pain.    ALLOPURINOL (ZYLOPRIM) 300 MG TABLET    Take 1 tablet by mouth daily.   ASCORBIC ACID (VITAMIN C) 500 MG TABLET    Take 500 mg by mouth daily.     ASPIRIN 81 MG TABLET    Take 81 mg by mouth daily.     CHOLECALCIFEROL (VITAMIN D) 1000 UNITS CAPSULE    Take 1,000 Units by mouth daily.     DILTIAZEM (CARDIZEM CD) 180 MG 24 HR CAPSULE    TAKE ONE CAPSULE BY MOUTH EVERY DAY   FERROUS SULFATE 325 (65 FE) MG TABLET    Take 1 tablet (325 mg total) by mouth 2 (two) times daily with a meal.   FOLIC ACID (FOLVITE) 1 MG TABLET    Take 1 tablet (1 mg total) by mouth daily.   FUROSEMIDE (LASIX) 40 MG TABLET    Take 2 tablets every AM   HYDROXYZINE (ATARAX/VISTARIL) 25 MG TABLET    Take 1 tablet (25 mg total) by mouth every 6 (six) hours as needed.   LOPERAMIDE (IMODIUM) 2 MG CAPSULE    Take 1 capsule (2 mg total) by mouth as needed for diarrhea or loose stools.   MECLIZINE (ANTIVERT) 25 MG TABLET    Take 1/2-1 tablet by mouth every 4 hours as needed for dizziness   MULTIPLE VITAMIN (MULTIVITAMIN WITH MINERALS) TABS TABLET    Take 1 tablet by mouth daily.   MUPIROCIN OINTMENT (BACTROBAN) 2 %    1 APPLICATION APPLY ON THE SKIN AS DIRECTED APPLY TO AFFECTED AREA ON HAND AND COVER AS DIRECTED   OMEGA-3 FATTY ACIDS (FISH OIL) 1000 MG CAPS    Take 1 capsule by mouth daily.   OMEPRAZOLE (PRILOSEC) 20 MG CAPSULE    Take 20 mg by mouth daily.     ONDANSETRON (ZOFRAN) 8 MG TABLET    Take 1 tablet (8 mg total) by mouth every 6 (six) hours as needed for nausea or vomiting.   POLYETHYL GLYCOL-PROPYL GLYCOL (SYSTANE) 0.4-0.3 % SOLN    Place 1  drop into both eyes 4 (four) times daily.    POTASSIUM CHLORIDE (KLOR-CON M10) 10 MEQ TABLET    TAKE 2 TABLETS (20 MEQ TOTAL) BY MOUTH DAILY.   THIAMINE 100 MG TABLET    Take 1 tablet (100 mg total) by mouth daily.  Modified Medications   Modified Medication Previous Medication   ALPRAZOLAM (XANAX) 0.25 MG TABLET ALPRAZolam (XANAX) 0.25 MG tablet      Take 1/2 to 1 tablet by mouth 3 times daily as needed for anxiety    TAKE 1/2-1 TABLET BY MOUTH 3 TIMES A DAY AS NEEDED FOR ANXIETY   LEVOTHYROXINE (SYNTHROID, LEVOTHROID) 125 MCG TABLET levothyroxine (SYNTHROID, LEVOTHROID) 125 MCG tablet      TAKE 1/2 TABLET BY MOUTH DAILY BEFORE BREAKFAST    TAKE 1/2 TABLET BY MOUTH DAILY BEFORE BREAKFAST   PREDNISONE (DELTASONE) 10 MG TABLET predniSONE (DELTASONE) 10 MG tablet      Take 1 tablet (10 mg total) by mouth daily with breakfast.    Take 1 tablet (10 mg total) by mouth daily with breakfast.   SIMVASTATIN (ZOCOR) 20 MG TABLET simvastatin (ZOCOR) 20 MG tablet      TAKE 1 TABLET (20 MG TOTAL) BY MOUTH DAILY.    TAKE 1 TABLET (20 MG TOTAL) BY MOUTH DAILY.   TRAMADOL (ULTRAM) 50 MG TABLET traMADol (ULTRAM)  50 MG tablet      Take 1 tablet (50 mg total) by mouth every 8 (eight) hours as needed.    Take 1 tablet (50 mg total) by mouth every 8 (eight) hours as needed.  Discontinued Medications   No medications on file

## 2016-04-19 NOTE — Progress Notes (Signed)
Subjective:    Patient ID: Barbaraann Share, female    DOB: [birth year], 82 y.o.   MRN: [id number]  HPI 82 y/o WF here for a follow up visit... she has mult med problems including:  Hx asthmatic bronchitis;  HBP;  Ven Insuffic & edema;  Hyperchol;  Hypothyroid;  GERD/ Divertics/ Colon polyps;  Renal insuffic;  DJD;  Vit D defic;  Anxiety...  ~  SEE PREV EPIC NOTES FOR OLDER DATA >>    LABS 11/14:  Chems- ok w/ BS=108, Cr=1.3;  Uric=4.3 on Allopurinol100;  CBC- ok w/ Hg=12.6.Marland KitchenMarland Kitchen  December 15, 2013:  41moROV & [facility] w/ RUQ pain & dx w/ gallstones, cholecystitis, & had ERCP w/ sphincterotomy (DrStark) & stone extraction; treated w/ Cipro, LFTs improved, course complicated by LE cellulitis requiring addition of Vanco IV; she had f/u DrCornett, [facility] 2/15> brief note, did not rec elective surg given her age...  June 14, 2014:  [facility] by [facility] w/ sudden fever to 103, chills, and left leg pain/ redness/ swelling=> c/w severe cellulitis; she was felt to be septic but no lesions on leg, no areas of broken skin, wearing support hose, her Lasix dose was cut from '80mg'$ Qam to '40mg'$ /d, other meds the same...   CXR 9/15 showed borderline cardiomeg, underlying chr lung dis, left base atx vs effusion, ?early RUL opac...   EKG 9/15 showed NSR w/ PACs, rate92, NSSTTWA, NAD...  MRI of left leg 9/15> diffuse SQ soft tissue swelling & edema, no discrete abscess identified, no osteomyelitis, no signs of septic arthritis, etc (c/w severe cellulitis).  Ven Dopplers 9/15> no evid of DVT or superficial thrombosis in left leg, enlarged LN's noted in left groin  LABS 9/15 in Hosp> Hg=10-11 range, WBC=29K=>11.7 by disch, BUN/Cr= 39/1.3 improved to 27/0.9 by disch, MRSA=neg, UA=clear...  LABS 9/15 in office f/u>  Chems- ok x Na=131, Cr=1.4;  CBC- ok x   CT Abd&Pelvis 9/15> showed no acute abnormality & no venous obstruction or lymphocele to suggest a cause for left leg  swelling; Cholelithiasis; unchanged bilateral adnexal cystic lesions (probably cysts); unchanged renal cysts and left renal atrophy; unchanged left perianal lipoma...  CXR 10/15 showed clearing of patchy RUL opac & left effusion, lungs now clear & back to baseline, NAD...  LABS 101/15:  Chems- ok x Na=132, BUN=33, Cr=1.3;  CBC- ok w/ Hg=12.0, WBC=7.6;  Sed=54 (improved from 90)...  ~  December 06, 2014:  227moOV & [place] hospital check> [place]14 by [facility] w/ right leg cellulitis- states she was out shopping & "over-did it", felt weak & tired then developed right leg discomfort, increased swelling/ redness/ etc; WBC was 21K, VenDopplers were neg for DVT, treated w/ Zosyn/ Vanco & changed to oral Doxy at disch;  She notes that her leg is improved- less swollen, less red, less painful but still sl tender; she notes that right leg first started giving her trouble after the birth of her last child; she had thorough vasc eval by DrEarly in the past and no surgery indicated; she knows to elim salt/sodium, elev legs, wear support hose; she has finished the Doxy100Bid given at disch & she misunderstood the DC directions and has been taking her prev Lasix'80mg'$  Qam... We reviewed the following medical problems during today's office visit >> NOTE: as an afterthought she noted vag itching & wanted Rx- offered Diflucan orally & if not improved she will need Gyn eval (she  has Atarax as well on her med list)...     HBP> on Diltiazem180, Furosemide 19mQam, & off KCl since disch;  BP=134/62 today & she denies CP, palpit, dizzy, ch in SOB, etc...    VI, Edema, Cellulitis> she knows to elim sodium, elevate, wear support hose; still taking Lasix80Qam; Hosp 3/16 as above & disch on Doxy- now out & right leg improved she says; RLE chr larger than LLE, sl red/ tender; Rec decr Lasix40.     CHOL> on Simva20 Qhs + low fat diet; last FLP 5/13 showed TChol 191, TG 81, HDL 82, LDL 93... Needs to ret fasting for f/u  blood work.    Hypothy> on Synthroid 1224m- 1/2 tab daily; labs 3/16 showed TSH= 1.21    Renal Insuffic> Creat prev up to 1.8 on the Lasix 8014mCreat 3/16= 1.52=>1.12    Derm> she has Psoriasis on her arms & has been evaluated by DrJordan/Jones; they also treated the LE cellulitis; Rec Atarax prn itching...    Anemia> Hosp 1/15 w/ gallstones and cholecystits (no surg); Hg= 7.5-9.4 at that time & Hg improved to 12.7; labs 3/16 showed Hg= 13.2=>10.6, Fe=13 (7%sat), Ferritin=104, on FeSO4... We reviewed prob list, meds, xrays and labs> see below for updates >>   CXR 3/16 showed norm heart size, mild left base atx, otherw OK...   EKG 3/16 showed NSR, rate86, PACs, poor r prog V1-2, NAD... Marland KitchenMarland KitchenVen Dopplers 3/16 were neg for DVT...   2DEcho 3/16 showed mild LVH w/ mild focal basal septal hypertrophy, norm LVF w/ EF=60-65%, norm wall motion, Gr1DD, norm AoV, mild MR, mild LA dil, norm RV, PAsys=38...  LABS 3.16:  Chems- ok x BUN=37 due to Lasix80 she was on & we decr her to 50m74m CBC- wnl,  Sed=65;  PCT<0.10 indicating +inflamm but no infection now...  PLAN>> we decided to check f/u labs (BMet, CBC, Sed, PCT); Rec to apply cool compresses to right leg several times daily, elim salt/sodium, elev legs, wear support hose; recurrent cellulitis episodes may require suppressive antibiotic therapy... Labs ret w/ BUN=37 7 we decr her Lasix from 80=>40/d; elev Sed w/ neg PCT & we are holding any further antibiotics for now...  ~  April 05, 2015:  23mo 81mo& Rhia's CC is orthopedic w/ hip and back pain & she tells me that DrNewton has diagnosed Gout, Rx Allopurinol300- we do not have notes from him; Recall prev evals from Rheum- DrDeveshwar, Dx w/ Gout tophi (on Allopurinol), severe OA in hands and hips, s/p bilat TKRs- on Allopurinol, Tramadol (refilled), Tylenol... We reviewed the following medical problems during today's office visit >>     HBP> on Diltiazem180, Furosemide 50mgQ71m& KCl Bid;  BP=120/70  today & she denies CP, palpit, dizzy, ch in SOB, etc...    VI, Edema, Cellulitis> she knows to elim sodium, elevate, wear support hose; still taking Lasix40Qam; Hosp 3/16 as above & disch on Doxy- right leg improved she says; RLE chr larger than LLE, sl red/ tender; Rec continue Lasix40.     CHOL> on Simva20 Qhs + low fat diet; last FLP 5/13 showed TChol 191, TG 81, HDL 82, LDL 93... Needs to ret fasting for f/u blood work.    Hypothy> on Synthroid 125mcg-59m tab daily; labs 3/16 showed TSH= 1.21    Renal Insuffic> Creat prev up to 1.8 on Lasix 80mg; C61m 3/16= 1.52=>1.12; Labs 7/16 stable w/ BUN=35, Cr=1.13    Hx severe DJD, Gout w/ tophi, s/p bilat  TKRs> prev followed by Ladene Artist; on Allopurinol, Tramadol, Tylenol...    Derm> she has Psoriasis on her arms & has been evaluated by DrJordan/Jones; they also treated the LE cellulitis; Rec Atarax prn itching...    Anemia> Hosp 1/15 w/ gallstones and cholecystits (no surg); Hg= 7.5-9.4 at that time & Hg improved to 12.7; labs 7/16 showed Hg= 12.1, Fe=102 (32%sat), Ferritin=116, on FeSO4/d... We reviewed prob list, meds, xrays and labs> see below for updates >>   LABS 7/16:  Chems- ok w/ BUN=35, Cr=1.13, BS=100, BNP=268;  CBC- ok w/ Hg=12.1, Fe=102 (32%), Ferritin=116...  ~  August 07, 2015:  42moROV & post-ER follow up visit> [place]/o HA rated 8/10> frontal HA, assoc n/v, felt weak & unsteady; BP was ok, VS were stable, Labs ok, CT Head was neg- wnl; Tylenol helped... we reviewed the following medical problems during today's office visit >>     HBP> on Diltiazem180, Furosemide 436mam, & KCl Bid;  BP=124/60 today & she denies CP, palpit, dizzy, ch in SOB, etc...    VI, Edema, Hx cellulitis> she knows to elim sodium, elevate, wear support hose; on Lasix40Qam; [facility] on D[facility]- right leg improved; RLE chr larger than LLE, sl red/ tender; Rec continue Lasix40.     CHOL> on Simva20 Qhs + low fat diet; last FLP 5/13  showed TChol 191, TG 81, HDL 82, LDL 93... Needs to ret fasting for f/u blood work.    Hypothy> on Synthroid 12511m 1/2 tab daily; labs 3/16 showed TSH= 1.21    Renal Insuffic> Creat prev up to 1.8 on Lasix 74m32mreat 3/16= 1.52=>1.12; Labs 7/16 stable w/ BUN=35, Cr=1.13    Hx severe DJD, Gout w/ tophi, s/p bilat TKRs> prev followed by DrDeveshwar; on Allopurinol, Tramadol, Tylenol...    Derm> she has Psoriasis on her arms & has been evaluated by DrJordan/Jones; they also treated the LE cellulitis; Rec Atarax prn itching...    Anemia> Hosp 1/15 w/ gallstones and cholecystits (no surg); Hg= 7.5-9.4 at that time & Hg improved to 12.7; labs 7/16 showed Hg= 12.1, Fe=102 (32%sat), Ferritin=116, on FeSO4/d...    Anxiety>  On Xanax 0.25- 1/2 to 1 tab tid prn... EXAM shows Afeb, VSS, O2sat=96% on RA:  Heent- neg, mallampati1;  Chest- clear w/o w/r/r;  Heart- RR gr1/6SEM w/o r/g;  Abd- soft, nontender, neg;  Ext- VI, 1+edema, ecchymoses;  Neuro- no focal neuro deficits...   CT Head 07/20/15>  Intact, WNL, NAD...  Labs 06/2015>  Chems- wnl;  CBC- wnl... IMP/PLAN>>  OK Flu vaccine today;  Continue same meds....  ~  February 28, 2016:  60mo 33mo& Machelle was seen by MW 5/[facility]/17 w/ ?cellulitis right leg, she had been seeing Derm & Rx w/ creams but no better; she was Afeb, WBC=8.4K, Sed=44; he prescribed Keflex but no better she says & pt has followed up w/ Derm=> legs wrapped Qwk now, swelling worse, wt up 7# to 142# today => this time Rx w/ Pred10, Atarax, incr Lasix80 & no salt...    HBP> on Diltiazem180, Furosemide 40mgQ98m& KCl 10Bid;  BP=120/60 today & she denies CP, palpit, dizzy, ch in SOB, etc...    VI, Edema, Hx cellulitis> she knows to elim sodium, elevate, wear support hose; on Lasix40Qam; Hosp 3/16 & disch on Doxy; Hx cellulitis & non-infection inflammation treated w/ diff antibiotics and Pred, Lasix, no salt, wraps & creams from Derm DScherrie Merrittsisits, and us...[place]land KitchenMarland Kitchen  CHOL> on Simva20 Qhs + low fat  diet; last FLP 5/13 showed TChol 191, TG 81, HDL 82, LDL 93... Needs to ret fasting for f/u blood work.    Hypothy> on Synthroid 150mg- 1/2 tab daily; labs 3/16 showed TSH= 1.21    Renal Insuffic> Creat prev up to 1.8 on Lasix '80mg'$ ; Creat 3/16= 1.52=>1.12; Labs 2016 stable w/ BUN=23-37, Cr=1.13-1.28; Labs 5/17 showed BUN=25, Cr=1.33    Hx severe DJD, Gout w/ tophi, s/p bilat TKRs> prev followed by DrDeveshwar; on Allopurinol, Tramadol, Tylenol...    Derm> she has Psoriasis on her arms & has been evaluated by DrJordan/Jones; they also treated the LE cellulitis/ inflamm w/ wraps and creams, Atarax prn itching...    Anemia> Hosp 1/15 w/ gallstones and cholecystits (no surg); Hg= 7.5-9.4 at that time & Hg improved to 12.7; labs 7/16 showed Hg= 12.1, Fe=102 (32%sat), Ferritin=116, on FeSO4/d; labs 5/16 showed Hg=11.8    Anxiety>  On Xanax 0.25- 1/2 to 1 tab tid prn... EXAM shows Afeb, VSS, O2sat=98% on RA:  Heent- neg, mallampati1;  Chest- decr BS w/o w/r/r;  Heart- RR gr1/6SEM w/o r/g;  Abd- soft, nontender, neg;  Ext- VI, +edema to above knees, leg wrapped;  Neuro- intact... IMP/PLAN>>  Not better w/ Keflex, looks more inflammed than infected=> Rx w/ Pred10, Atarax25 prn, incr Lasix40-2Qam & no salt, ROV in 2wks...            Problem List:  GLAUCOMA (ICD-365.9) - hx mult eye problems w/ prev corneal transplant & glaucoma laser eye surg... [facility]  ASTHMATIC BRONCHITIS, ACUTE (ICD-466.0) - Hx AB requiring Rx w/ Depo/ Pred/ Mucinex/ Tussionex... ~  CXR 4/11 showed vague nodular densities on left... ~  [facility]11[facility]  [facility], [facility] dx w/ pneumonia rx w/ ZPak... ~  CXR 3/12 showed borderline cardiomeg, nodular opac left base, NAD... ~  Spring 2013: she notes some allergy symptoms... ~  CXR 1/15 showed mild cardiomeg, atherosclerotic & tortuous Ao, low lung vol & sl incr markings, NAD..[name]  ~  CXR 9/15 showed borderline cardiomeg, underlying  chr lung dis, left base atx vs effusion, ?early RUL opac... ~  CXR 10/15 showed clearing of patchy RUL opac & left effusion, lungs now clear & back to baseline, NAD ~  CXR 3/16 in hosp showed norm heart size, mild left base atx, otherw OK...   HYPERTENSION (ICD-401.9) - controlled on ASA '81mg'$ /d,  CARTIAXT '180mg'$ /d, LASIX '40mg'$ -  1-2Qam... ~  7/10:  labs showed BUN=54, Creat=2.0 & rec to decr diuretics in half= one Demedex & 1/2 Aldactone. ~  11/10:  labs showed BUN=38, Creat=1.8 & rec to keep same meds. ~  8/11:  labs showed BUN=42, Creat=2.1, K=4.3 ~  1/12:  labs in ER showed BUN=56, Creat=2.67, therefore diuretics decr to Demadex20/d only. ~  3/12:  2DEcho showed mod LVH, norm sys function w/ EF=60-65% & no regional wall motion abn; Gr1DD, mibile density on AoV- prob lambl'e escrescence, mildMR. ~  5/12:  f/u labs in office showed BUN=45, Creat=1.8, on Lasix '80mg'$  Qam for her edema... ~  9/12:  Labs by DrDeveshwar 9/12 showed BUN=33, Creat=1.47, edema resolved therefore decr Lasix to '40mg'$ /d. ~  1/13:  BP= 160/76 but hasn't taken meds today; labs improved w/ BUN=26, Creat=1.3, BNP=50 ~  5/13:  BP= 140/78 & denies CP, palpit, SOB, edema; labs improved w/ BUN=28, Creat=1.2 ~  9/13:  BP= 140/60 & she denies CP, palpt,  ch in SOB or edema... ~  12/13: controlled on Diltiazem180 & Furosemide '40mg'$ -2Qam w/ BP=138/62 today & she denies CP, palpit, dizzy, ch inSOB, etc... ~  3/14:  BP= 130/66 on same meds; Labs stable as well w/ Cr=1.5, continue same... ~  7/14: on Diltiazem180 & Furosemide '40mg'$ -2Qam w/ BP=150/64 today & she denies CP, palpit, dizzy, ch in SOB, etc. ~  11/14: BP= 126/64 on same meds, stable... ~  EKG 1/15 showed NSR, rate92, NSSTTWA, NAD... ~  3/15: on Diltiazem180 & Furosemide '40mg'$ -2Qam w/ BP=118/70 today & she denies CP, palpit, dizzy, ch in SOB, etc. ~  9/15: post hosp> off Diltiazem now & still on Furosemide '40mg'$ - 1-2Qam & K10-2/d w/ BP=146/60 w/ leg swelling, edema, cellulitis as  below... ~  1/16: on Diltiazem180, Furosemide '40mg'$ -2Qam, & K10-2/d;  BP=132/64 today & she denies CP, palpit, dizzy, ch in SOB, etc. ~  3/16: on Diltiazem180, Furosemide '40mg'$ Qam, & off KCl since disch;  BP=134/62 today & she denies CP, palpit, dizzy, ch in SOB, etc ~  7/16: on Diltiazem180, Furosemide '40mg'$ Qam, & KCl Bid;  BP=120/70 & she remains asymptomatic...  PALPITATIONS, HX OF (ICD-V12.50) ~  3/16:  EKG showed NSR, rate86, PACs, poor r prog V1-2, NAD...  VENOUS INSUFFICIENCY/ EDEMA >> swelling controlled w/ diuretics, low sodium diet, elevation, support hose, etc... RIGHT LEG SWELLING > LEFT LEG... Pt notes it's been like this since her last child was born... Hx RECURRENT CELLULITIS >>  ~  She saw DrEarly [facility]- there was nothing he could do... ~  Swelling diminished w/ low sodium, elevation, support hose, & Lasix40 1-2tabs daily... ~  Nov-Dec2013: she knows to elim sodium, elevate, wear support hose; she has been seen by [facility], DrEarly & there is nothing they can do; recently checked by Payton Mccallum- DrJones w/ patch dressing & support hose; edema decr w/ Lasix '80mg'$ /d; Lab today shows Creat=1.5.[name]. ~  7/14:  she knows to elim sodium, elevate, wear support hose, & the Lasix80; weight is down 10# today to 141# w/ resolution of edema; [name] (Derm) treated dermatitis w/ new cream & improved. ~  11/14: wt is back up 8# to 149# today & she needs to elim sodium, elev legs, etc... ~  3/15: she knows to elim sodium, elevate, wear support hose, & the Lasix80; weight is back down 9# today to 137#.[name]. ~  9//15: she was [facility] for cellulitis in LLE (NOS) w/ 4+edema etc; VenDopplers neg for DVT; no open wounds/ drainage; treated w/ IV antibiotics and disch on Keflex; has persistent erythema, pain, swelling & we decided to continue the Keflex & refer to ID... ~  CT Abd&Pelvis 9/15> showed no acute abnormality & no venous obstruction or lymphocele to suggest a cause for left leg swelling; Cholelithiasis; unchanged  bilateral adnexal cystic lesions (probably cysts); unchanged renal cysts and left renal atrophy; unchanged left perianal lipoma... ~  10/15: she is improved after the extended course of Keflex w/ decr erythema, swelling & discomfort; she did not see ID; plan is to continue same meds, cleansing, elevation, no salt, support hose... ~  3/16: she was [facility] w/ right leg cellulitis- states she was out shopping & "over-did it", felt weak & tired then developed right leg discomfort, increased swelling/ redness/ etc; WBC was 21K, VenDopplers were neg for DVT, treated w/ Zosyn/ Vanco & changed to oral Doxy at disch;  She notes that her leg is improved- less swollen, less red, less painful but still sl tender; ~  07/2015: back to baseline... ~  02/2016: Recurrent redness/ swelling 01/2016- no better on Keflex from DrWert + topical wraps and creams from [place], Derm; we decided to treat w/ Pred10, incr Lasix80, no salt, Atarax for itching...  HYPERCHOLESTEROLEMIA (ICD-272.0) - prev Rx'd by DrGegick... Now on ZOCOR '20mg'$ /d (prev Crestor5 & Lescol from [facility]) ~  she brought labs from [facility] 5/10 (off med due to $$$) w/ TChol 285, TG 204, HDL 69, LDL 195... "he was mad at me" ~  FLP 5/11 from [facility] showed TChol 202, TG 60, HDL 112, LDL 99 ~  She is reminded (again) to come FASTING for f/u FLP... ~  [place] 5/13 on Simva20 showed TChol 191, TG 81, HDL 82, LDL 93 ~  She is reminded to ret FASTING for f/u blood work...  HYPOTHYROIDISM (ICD-244.9) - prev followed by DrGegick on SYNTHROID 13mg- 1/2 daily... ~  [facility] w/ TSH= 2.90, FreeT4= 1.09 ~  labs 8/11 showed TSH= 1.87 ~  Labs 3/12 showed TSH= 2.81 ~  Labs 1/13 showed TSH= 1.65 ~  Labs 3/14 showed TSH= 4.28 ~  Labs 3/16 showed TSH= 1.21  GERD (ICD-530.81) - uses PRILOSEC '20mg'$ /d... last EGD was 6/06 by DrPatterson showing 3cmHH, reflux...   DIVERTICULOSIS OF COLON (ICD-562.10) & COLONIC POLYPS (ICD-211.3) - last colonoscopy 6/06 was WNL-  no abnormality seen...  GALLSTONE and CHOLECYSTITIS >>  ~  [facility]/15[facility] w/ RUQ pain & dx w/ gallstones, cholecystitis, & had ERCP w/ sphincterotomy (DrStark) & stone extraction; treated w/ Cipro, LFTs improved, course complicated by LE cellulitis requiring addition of Vanco IV; she had f/u DrCornett, [facility] 2/15> brief note, did not rec elective surg given her age. ~  CT Abd&Pelvis 9/15> showed no acute abnormality & no venous obstruction or lymphocele to suggest a cause for left leg swelling; Cholelithiasis; unchanged bilateral adnexal cystic lesions (probably cysts); unchanged renal cysts and left renal atrophy; unchanged left perianal lipoma  RENAL INSUFFICIENCY (ICD-588.9) - tough balance betw renal perfusion & diuresis for edema... ~  labs 10/08 showed BUN= 27, Creat= 1.5 ~  labs 8/09 showed BUN= 44, Creat= 1.9 ~  labs 2/10 showed BUN 46, Creat= 1.7 ~  labs 7/10 showed BUN= 54, Creat= 2.0 ~  labs 11/10 showed BUN= 38, Creat= 1.8 ~  labs 8/11 showed BUN= 42, Creat= 2.1 ~  labs in ER 1/12 showed BUN=56, Creat=2.67, rec decr diuretics to Demadex'20mg'$ /d only. ~  Labs 3/12 showed improved renal function w/ BUN=31, Creat=1.4... ~  Labs 5/12 on Lasix80 showed BUN=45, Creat=1.8 ~  Labs 9/12 by DrDeveshwar showed BUN=33, Creat=1.47 ~  Labs 1/13 showed BUN=26, Creat=1.3, BNP=50... On Lasix '40mg'$  Qam. ~  Labs 5/13 showed BUN=28, Creat=1.2 ~  Labs 12/13 on Lasix80 showed BUN=30, Creat=1.5 ~  Labs 3/14 on Lasix80 showed BUN=31, Creat=1.5 ~  Labs 6/14 by DrDeveshwar on Lasix80 showed BUN=30, Cr=1.3 ~  Labs here 11/14 on Lasix80 showed BUN=31, Cr= 1.3 ~  [facility], s/p ERCP, and Creat- 1.1 - 1.7 ~  Labs 9/15 showed Cr= 0.94 to 1.4 range... ~  Labs 10/15 showed Cr= 1.3-1.4 ~  Labs 3/16 showed Cr= 1.52=>1.12 ~  Labs 7/16 showed BUN= 35, Cr= 1.13  DEGENERATIVE JOINT DISEASE (ICD-715.90) - this is her chief complaint- s/p right TKR in 1999,  left  TKR 3/10... on MOBIC 7.'5mg'$  Prn & VICODIN Prn as well... followed by DrYates; and had right second toe amp by DArnette Norris2010 for hammertoe + spurs..Marland KitchenMarland Kitchen  also had epidural steroid shot from [facility] for LBP (without benefit she says)... ~  5/13:  She saw DrDeveshwar for Rheum w/ shot in hip & sl improved... 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 rec- start Vit D 50,000 u weekly... ~  labs 2/10 showed Vit D level = 30... rec- continue Vit D 50K weekly... ~  labs 7/10 showed Vit D level = 39... rec- change to 1000 u OTC daily. ~  labs 8/11 showed Vit D level = 38... continue same. ~  Labs 3/14 showed Vit D level = 43... Continue 1000u daily.  ANXIETY (ICD-300.00) - on ALPRAZOLAM 0.'25mg'$ Tid Prn... several deaths in the family... daughter who lives w/ her recently ill...  SHINGLES - she had right T9-10 shingles in 2011 & resolved w/ Valtrex, Pred, etc...   Past Surgical History:  Procedure Laterality Date  . ABDOMINAL HYSTERECTOMY    . BREAST BIOPSY     Benign  . CATARACT EXTRACTION    . ERCP N/A 10/06/2013   Procedure: ENDOSCOPIC RETROGRADE CHOLANGIOPANCREATOGRAPHY (ERCP);  Surgeon: Ladene Artist, MD;  Location: [place];  Service: Endoscopy;  Laterality: N/A;  . TOE AMPUTATION  08/2009   Right second toe - Dr Beola Cord  . TOTAL KNEE ARTHROPLASTY  1999   right  . TOTAL KNEE ARTHROPLASTY  11/2008   left - Dr Lorin Mercy    Outpatient Encounter Prescriptions as of 02/28/2016  Medication Sig Dispense Refill  . acetaminophen (TYLENOL) 325 MG tablet Take 325 mg by mouth every 4 (four) hours as needed for mild pain or moderate pain.     [name] allopurinol (ZYLOPRIM) 300 MG tablet Take 1 tablet by mouth daily.    . Ascorbic Acid (VITAMIN C) 500 MG tablet Take 500 mg by mouth daily.      [name] aspirin 81 MG tablet Take 81 mg by mouth daily.      . Cholecalciferol (VITAMIN D) 1000 UNITS capsule Take  1,000 Units by mouth daily.      [name] diltiazem (CARDIZEM CD) 180 MG 24 hr capsule TAKE ONE CAPSULE BY MOUTH EVERY DAY 90 capsule 3  . ferrous sulfate 325 (65 FE) MG tablet Take 1 tablet (325 mg total) by mouth 2 (two) times daily with a meal. 60 tablet 2  . folic acid (FOLVITE) 1 MG tablet Take 1 tablet (1 mg total) by mouth daily. 30 tablet 1  . furosemide (LASIX) 40 MG tablet Take 2 tablets every AM 60 tablet 3  . loperamide (IMODIUM) 2 MG capsule Take 1 capsule (2 mg total) by mouth as needed for diarrhea or loose stools. (Patient not taking: Reported on 04/03/2016) 10 capsule 0  . meclizine (ANTIVERT) 25 MG tablet Take 1/2-1 tablet by mouth every 4 hours as needed for dizziness 50 tablet 0  . Multiple Vitamin (MULTIVITAMIN WITH MINERALS) TABS tablet Take 1 tablet by mouth daily. 30 tablet 0  . mupirocin ointment (BACTROBAN) 2 % 1 APPLICATION APPLY ON THE SKIN AS DIRECTED APPLY TO AFFECTED AREA ON HAND AND COVER AS DIRECTED  0  . Omega-3 Fatty Acids (FISH OIL) 1000 MG CAPS Take 1 capsule by mouth daily.    [name] omeprazole (PRILOSEC) 20 MG capsule Take 20 mg by mouth daily.      . ondansetron (ZOFRAN) 8 MG tablet Take 1 tablet (8 mg total) by mouth every  6 (six) hours as needed for nausea or vomiting. (Patient not taking: Reported on 04/03/2016) 20 tablet 0  . Polyethyl Glycol-Propyl Glycol (SYSTANE) 0.4-0.3 % SOLN Place 1 drop into both eyes 4 (four) times daily.     . potassium chloride (KLOR-CON M10) 10 MEQ tablet TAKE 2 TABLETS (20 MEQ TOTAL) BY MOUTH DAILY. 180 tablet 3  . thiamine 100 MG tablet Take 1 tablet (100 mg total) by mouth daily. (Patient not taking: Reported on 04/18/2016) 30 tablet 1  . [DISCONTINUED] ALPRAZolam (XANAX) 0.25 MG tablet TAKE 1/2-1 TABLET BY MOUTH 3 TIMES A DAY AS NEEDED FOR ANXIETY 90 tablet 3  . [DISCONTINUED] cephALEXin (KEFLEX) 500 MG capsule Take 1 capsule (500 mg total) by mouth 2 (two) times daily. x2 weeks (Patient not taking: Reported on 03/13/2016) 28 capsule 0  .  [DISCONTINUED] furosemide (LASIX) 40 MG tablet Take 2 tablets every AM    . [DISCONTINUED] levothyroxine (SYNTHROID, LEVOTHROID) 125 MCG tablet TAKE 1/2 TABLET BY MOUTH DAILY BEFORE BREAKFAST 90 tablet 3  . [DISCONTINUED] simvastatin (ZOCOR) 20 MG tablet TAKE 1 TABLET (20 MG TOTAL) BY MOUTH DAILY. 90 tablet 1  . [DISCONTINUED] traMADol (ULTRAM) 50 MG tablet Take 1 tablet (50 mg total) by mouth every 8 (eight) hours as needed. 90 tablet 0  . hydrOXYzine (ATARAX/VISTARIL) 25 MG tablet Take 1 tablet (25 mg total) by mouth every 6 (six) hours as needed. 50 tablet 0  . [DISCONTINUED] acetaminophen (TYLENOL) 500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

## 2016-05-08 ENCOUNTER — Ambulatory Visit: Payer: Medicare Other | Admitting: Pulmonary Disease

## 2016-05-22 ENCOUNTER — Ambulatory Visit (INDEPENDENT_AMBULATORY_CARE_PROVIDER_SITE_OTHER): Payer: Medicare Other | Admitting: Pulmonary Disease

## 2016-05-22 ENCOUNTER — Encounter: Payer: Self-pay | Admitting: Pulmonary Disease

## 2016-05-22 VITALS — BP 144/50 | HR 78 | Temp 97.3°F | Ht 63.0 in | Wt 130.4 lb

## 2016-05-22 DIAGNOSIS — Z872 Personal history of diseases of the skin and subcutaneous tissue: Secondary | ICD-10-CM | POA: Diagnosis not present

## 2016-05-22 DIAGNOSIS — Z23 Encounter for immunization: Secondary | ICD-10-CM | POA: Diagnosis not present

## 2016-05-22 DIAGNOSIS — R609 Edema, unspecified: Secondary | ICD-10-CM | POA: Diagnosis not present

## 2016-05-22 DIAGNOSIS — M150 Primary generalized (osteo)arthritis: Secondary | ICD-10-CM

## 2016-05-22 DIAGNOSIS — R42 Dizziness and giddiness: Secondary | ICD-10-CM

## 2016-05-22 DIAGNOSIS — M159 Polyosteoarthritis, unspecified: Secondary | ICD-10-CM

## 2016-05-22 DIAGNOSIS — I872 Venous insufficiency (chronic) (peripheral): Secondary | ICD-10-CM

## 2016-05-22 DIAGNOSIS — E039 Hypothyroidism, unspecified: Secondary | ICD-10-CM

## 2016-05-22 DIAGNOSIS — I359 Nonrheumatic aortic valve disorder, unspecified: Secondary | ICD-10-CM

## 2016-05-22 NOTE — Patient Instructions (Signed)
Today we updated your med list in our EPIC system...    Continue your current medications the same...  Try the Epley maneuver for the dizziness...  You may use the MECLIZINE 25mg - 1/2 tab every 4H as needed for the dizziness...  Be very careful with your ambulation-- NO FALLING ALLOWED!  Call for any questions...  Let's plan a follow up visit in 41mo, sooner if needed for problems.Marland KitchenMarland Kitchen

## 2016-05-22 NOTE — Progress Notes (Signed)
Subjective:    Patient ID: [name], female    DOB: 10-16-1914, 82 y.o.   MRN: [id number]  HPI 82 y/o WF here for a follow up visit... she has mult med problems including:  Hx asthmatic bronchitis;  HBP;  Ven Insuffic & edema;  Hyperchol;  Hypothyroid;  GERD/ Divertics/ Colon polyps;  Renal insuffic;  DJD;  Vit D defic;  Anxiety...  ~  SEE PREV EPIC NOTES FOR OLDER DATA >>    LABS 11/14:  Chems- ok w/ BS=108, Cr=1.3;  Uric=4.3 on Allopurinol100;  CBC- ok w/ Hg=12.6.Marland KitchenMarland Kitchen  December 15, 2013:  63moROV & [name] w/ RUQ pain & dx w/ gallstones, cholecystitis, & had ERCP w/ sphincterotomy ([name]) & stone extraction; treated w/ Cipro, LFTs improved, course complicated by LE cellulitis requiring addition of Vanco IV; she had f/u [name], [facility] 2/15> brief note, did not rec elective surg given her age...  June 14, 2014:  [facility] by [facility] w/ sudden fever to 103, chills, and left leg pain/ redness/ swelling=> c/w severe cellulitis; she was felt to be septic but no lesions on leg, no areas of broken skin, wearing support hose, her Lasix dose was cut from 836mam to 4059m, other meds the same...   CXR 9/15 showed borderline cardiomeg, underlying chr lung dis, left base atx vs effusion, ?early RUL opac...   EKG 9/15 showed NSR w/ PACs, rate92, NSSTTWA, NAD...  MRI of left leg 9/15> diffuse SQ soft tissue swelling & edema, no discrete abscess identified, no osteomyelitis, no signs of septic arthritis, etc (c/w severe cellulitis).  Ven Dopplers 9/15> no evid of DVT or superficial thrombosis in left leg, enlarged LN's noted in left groin  LABS 9/15 in Hosp> Hg=10-11 range, WBC=29K=>11.7 by disch, BUN/Cr= 39/1.3 improved to 27/0.9 by disch, MRSA=neg, UA=clear...  LABS 9/15 in office f/u>  Chems- ok x Na=131, Cr=1.4;  CBC- ok x   CT Abd&Pelvis 9/15> showed no acute abnormality & no venous obstruction or lymphocele to suggest a cause for left leg  swelling; Cholelithiasis; unchanged bilateral adnexal cystic lesions (probably cysts); unchanged renal cysts and left renal atrophy; unchanged left perianal lipoma...  CXR 10/15 showed clearing of patchy RUL opac & left effusion, lungs now clear & back to baseline, NAD...  LABS 101/15:  Chems- ok x Na=132, BUN=33, Cr=1.3;  CBC- ok w/ Hg=12.0, WBC=7.6;  Sed=54 (improved from 90)...  ~  December 06, 2014:  59mo63mo & [facility] hospital check> [name] w/ right leg cellulitis- states she was out shopping & "over-did it", felt weak & tired then developed right leg discomfort, increased swelling/ redness/ etc; WBC was 21K, VenDopplers were neg for DVT, treated w/ Zosyn/ Vanco & changed to oral Doxy at disch;  She notes that her leg is improved- less swollen, less red, less painful but still sl tender; she notes that right leg first started giving her trouble after the birth of her last child; she had thorough vasc eval by [name] in the past and no surgery indicated; she knows to elim salt/sodium, elev legs, wear support hose; she has finished the Doxy100Bid given at disch & she misunderstood the DC directions and has been taking her prev Lasix80mg13m... We reviewed the following medical problems during today's office visit >> NOTE: as an afterthought she noted vag itching & wanted Rx- offered Diflucan orally & if not improved she will need Gyn eval (she  has Atarax as well on her med list)...     HBP> on Diltiazem180, Furosemide 19mQam, & off KCl since disch;  BP=134/62 today & she denies CP, palpit, dizzy, ch in SOB, etc...    VI, Edema, Cellulitis> she knows to elim sodium, elevate, wear support hose; still taking Lasix80Qam; Hosp 3/16 as above & disch on Doxy- now out & right leg improved she says; RLE chr larger than LLE, sl red/ tender; Rec decr Lasix40.     CHOL> on Simva20 Qhs + low fat diet; last FLP 5/13 showed TChol 191, TG 81, HDL 82, LDL 93... Needs to ret fasting for f/u  blood work.    Hypothy> on Synthroid 1224m- 1/2 tab daily; labs 3/16 showed TSH= 1.21    Renal Insuffic> Creat prev up to 1.8 on the Lasix 8014mCreat 3/16= 1.52=>1.12    Derm> she has Psoriasis on her arms & has been evaluated by [name]/[name]; they also treated the LE cellulitis; Rec Atarax prn itching...    Anemia> Hosp 1/15 w/ gallstones and cholecystits (no surg); Hg= 7.5-9.4 at that time & Hg improved to 12.7; labs 3/16 showed Hg= 13.2=>10.6, Fe=13 (7%sat), Ferritin=104, on FeSO4... We reviewed prob list, meds, xrays and labs> see below for updates >>   CXR 3/16 showed norm heart size, mild left base atx, otherw OK...   EKG 3/16 showed NSR, rate86, PACs, poor r prog V1-2, NAD... Marland KitchenMarland KitchenVen Dopplers 3/16 were neg for DVT...   2DEcho 3/16 showed mild LVH w/ mild focal basal septal hypertrophy, norm LVF w/ EF=60-65%, norm wall motion, Gr1DD, norm AoV, mild MR, mild LA dil, norm RV, PAsys=38...  LABS 3.16:  Chems- ok x BUN=37 due to Lasix80 she was on & we decr her to 50m74m CBC- wnl,  Sed=65;  PCT<0.10 indicating +inflamm but no infection now...  PLAN>> we decided to check f/u labs (BMet, CBC, Sed, PCT); Rec to apply cool compresses to right leg several times daily, elim salt/sodium, elev legs, wear support hose; recurrent cellulitis episodes may require suppressive antibiotic therapy... Labs ret w/ BUN=37 7 we decr her Lasix from 80=>40/d; elev Sed w/ neg PCT & we are holding any further antibiotics for now...  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 We reviewed the following medical problems during today's office visit >>     HBP> on Diltiazem180, Furosemide 50mgQ71m& KCl Bid;  BP=120/70  today & she denies CP, palpit, dizzy, ch in SOB, etc...    VI, Edema, Cellulitis> she knows to elim sodium, elevate, wear support hose; still taking Lasix40Qam; Hosp 3/16 as above & disch on Doxy- right leg improved she says; RLE chr larger than LLE, sl red/ tender; Rec continue Lasix40.     CHOL> on Simva20 Qhs + low fat diet; last FLP 5/13 showed TChol 191, TG 81, HDL 82, LDL 93... Needs to ret fasting for f/u blood work.    Hypothy> on Synthroid 125mcg-59m tab daily; labs 3/16 showed TSH= 1.21    Renal Insuffic> Creat prev up to 1.8 on Lasix 80mg; C61m 3/16= 1.52=>1.12; Labs 7/16 stable w/ BUN=35, Cr=1.13    Hx severe DJD, Gout w/ tophi, s/p bilat  TKRs> prev followed by Ladene Artist; on Allopurinol, Tramadol, Tylenol...    Derm> she has Psoriasis on her arms & has been evaluated by [name]/[name]; they also treated the LE cellulitis; Rec Atarax prn itching...    Anemia> Hosp 1/15 w/ gallstones and cholecystits (no surg); Hg= 7.5-9.4 at that time & Hg improved to 12.7; labs 7/16 showed Hg= 12.1, Fe=102 (32%sat), Ferritin=116, on FeSO4/d... We reviewed prob list, meds, xrays and labs> see below for updates >>   LABS 7/16:  Chems- ok w/ BUN=35, Cr=1.13, BS=100, BNP=268;  CBC- ok w/ Hg=12.1, Fe=102 (32%), Ferritin=116...  ~  August 07, 2015:  42moROV & post-ER follow up visit> [place] c/o HA rated 8/10> frontal HA, assoc n/v, felt weak & unsteady; BP was ok, VS were stable, Labs ok, CT Head was neg- wnl; Tylenol helped... we reviewed the following medical problems during today's office visit >>     HBP> on Diltiazem180, Furosemide 436mam, & KCl Bid;  BP=124/60 today & she denies CP, palpit, dizzy, ch in SOB, etc...    VI, Edema, Hx cellulitis> she knows to elim sodium, elevate, wear support hose; on Lasix40Qam; [facility] D[facility]- right leg improved; RLE chr larger than LLE, sl red/ tender; Rec continue Lasix40.     CHOL> on Simva20 Qhs + low fat diet; last FLP 5/13  showed TChol 191, TG 81, HDL 82, LDL 93... Needs to ret fasting for f/u blood work.    Hypothy> on Synthroid 12511m 1/2 tab daily; labs 3/16 showed TSH= 1.21    Renal Insuffic> Creat prev up to 1.8 on Lasix 74m32mreat 3/16= 1.52=>1.12; Labs 7/16 stable w/ BUN=35, Cr=1.13    Hx severe DJD, Gout w/ tophi, s/p bilat TKRs> prev followed by [name]; on Allopurinol, Tramadol, Tylenol...    Derm> she has Psoriasis on her arms & has been evaluated by [name]/[name]; they also treated the LE cellulitis; Rec Atarax prn itching...    Anemia> Hosp 1/15 w/ gallstones and cholecystits (no surg); Hg= 7.5-9.4 at that time & Hg improved to 12.7; labs 7/16 showed Hg= 12.1, Fe=102 (32%sat), Ferritin=116, on FeSO4/d...    Anxiety>  On Xanax 0.25- 1/2 to 1 tab tid prn... EXAM shows Afeb, VSS, O2sat=96% on RA:  Heent- neg, mallampati1;  Chest- clear w/o w/r/r;  Heart- RR gr1/6SEM w/o r/g;  Abd- soft, nontender, neg;  Ext- VI, 1+edema, ecchymoses;  Neuro- no focal neuro deficits...   CT Head 07/20/15>  Intact, WNL, NAD...  Labs 06/2015>  Chems- wnl;  CBC- wnl... IMP/PLAN>>  OK Flu vaccine today;  Continue same meds....  ~  February 28, 2016:  60mo 33mo& [name] was [facility] by [facility] w/ ?cellulitis right leg, she had been seeing Derm & Rx w/ creams but no better; she was Afeb, WBC=8.4K, Sed=44; he prescribed Keflex but no better she says & pt has followed up w/ Derm=> legs wrapped Qwk now, swelling worse, wt up 7# to 142# today => this time Rx w/ Pred10, Atarax, incr Lasix80 & no salt...    HBP> on Diltiazem180, Furosemide 40mgQ98m& KCl 10Bid;  BP=120/60 today & she denies CP, palpit, dizzy, ch in SOB, etc...    VI, Edema, Hx cellulitis> she knows to elim sodium, elevate, wear support hose; on Lasix40Qam; Hosp 3/16 & disch on Doxy; Hx cellulitis & non-infection inflammation treated w/ diff antibiotics and Pred, Lasix, no salt, wraps & creams from Derm [facility], and us...K[place]Marland KitchenMarland Kitchen  CHOL> on Simva20 Qhs + low fat  diet; last FLP 5/13 showed TChol 191, TG 81, HDL 82, LDL 93... Needs to ret fasting for f/u blood work.    Hypothy> on Synthroid 123mg- 1/2 tab daily; labs 3/16 showed TSH= 1.21    Renal Insuffic> Creat prev up to 1.8 on Lasix 813m Creat 3/16= 1.52=>1.12; Labs 2016 stable w/ BUN=23-37, Cr=1.13-1.28; Labs 5/17 showed BUN=25, Cr=1.33    Hx severe DJD, Gout w/ tophi, s/p bilat TKRs> prev followed by [name]; on Allopurinol, Tramadol, Tylenol...    Derm> she has Psoriasis on her arms & has been evaluated by [name]/[name]; they also treated the LE cellulitis/ inflamm w/ wraps and creams, Atarax prn itching...    Anemia> Hosp 1/15 w/ gallstones and cholecystits (no surg); Hg= 7.5-9.4 at that time & Hg improved to 12.7; labs 7/16 showed Hg= 12.1, Fe=102 (32%sat), Ferritin=116, on FeSO4/d; labs 5/16 showed Hg=11.8    Anxiety>  On Xanax 0.25- 1/2 to 1 tab tid prn... EXAM shows Afeb, VSS, O2sat=98% on RA:  Heent- neg, mallampati1;  Chest- decr BS w/o w/r/r;  Heart- RR gr1/6SEM w/o r/g;  Abd- soft, nontender, neg;  Ext- VI, +edema to above knees, leg wrapped;  Neuro- intact... IMP/PLAN>>  Not better w/ Keflex, looks more inflammed than infected=> Rx w/ Pred10, Atarax25 prn, incr Lasix40-2Qam & no salt, ROV in 2wks...   ~  April 03, 2016:  [place] c/o chest congestion, wheezing, cough, sl yellow sput, ?low grade fever; had already been to [facility]; CXR showed diffuse peribronchial cuffing, no airsp dis; given Augmentin, bump Pred, Mucinex;  She states "I was never that sick before" but breathing is fine now & she denies CP/ change is SOB/ cough/ sputum, f/c/s, etc;  But while her breathing is improved- her left leg is swollen & painful again- NOTE: she ran out of Pred 1025m 2 weeks ago!  We decided to check labs>  Chems- ok x BUN=27, Cr=1.51;  CBC- anemic w/ Hg=9.9, MCV=98, WBC=7.8; Sed=70 (up from 44);  Rx w/ Lasix80 & Pred10m19magain; ROV in 2-3wks and continue follow up  w/ Derm...  ~  April 18, 2016:  2wk ROV [place] that her legs are much improved, decr swelling, wt down 5# more to 129# today; skin is improved w/ the Pred10- less red, no open areas, no weeping, no cellulitis, no f/c/s;  She went back to [employer] wraps used just topical cream which didn't seem to help much previously;  She spent a lot of the visit talking about her glaucoma- [facility] he can do, [place] wanted me to know that she is back on eye drops now;  We rechecked labs today>  Chems- ok w/ BUN=33, Cr=1.28;  TSH=1.42;  Sed=15 now on Pred 10mg93m.     EXAM shows Afeb, VSS, O2sat=100% on RA:  Heent- neg, mallampati1;  Chest- decr BS w/o w/r/r;  Heart- RR gr1/6SEM w/o r/g;  Abd- soft, nontender, neg;  Ext- VI, much improved w/ decr edema & erythema;  Neuro- intact... IMP/PLAN>>  Rec to contuinue low sodium diet & the Lasix40- 2tabs Qam;  OK to wean the Pred 10mg 58m to 1/2 tab Qam & stay on this!  ROV in 1 month...  ~  May 22, 2016:  43mo RO24moVirginia's CC is intermittent dizziness, using Meclizine w/ some improvement, fell onto  her bed one time- reminded to change positions slowly, careful w/ ambulation & may need cane or walker if not stable;  She also notes that she is [facility] & set up an appt for an eval that she saw in the newspaper recently...  We reviewed the following medical problems during today's office visit >>     HBP> on Diltiazem180, Furosemide '40mg'$ Qam, & KCl 10Bid;  BP=120/60 today & she denies CP, palpit, dizzy, ch in SOB, etc...    VI, Edema, Hx cellulitis> she knows to elim sodium, elevate, wear support hose; on Lasix40-2Qam & PRED10-1/2Qam; [facility] 3/16 & disch on Doxy; Hx cellulitis & non-infection inflammation treated w/ diff antibiotics and Pred, Lasix, no salt, wraps & creams from Derm [name], ER visits, and us=> now improved on tapering course of Pred- rec to continue '5mg'$ /d for now (Sed=15)...      CHOL> off Simva20 & on low fat diet; last FLP 5/13 showed TChol 191, TG 81, HDL 82, LDL 93... Needs to ret fasting for f/u blood work but she does not want Chol meds!    Hypothy> on Synthroid 175mg-1/2 tab daily; labs 7/17 showed TSH= 1.42 & rec to continue same...    Renal Insuffic> Creat prev up to 1.8 on Lasix '80mg'$ ; Creat 3/16= 1.52=>1.12; Labs 2016 stable w/ BUN=23-37, Cr=1.13-1.28; Labs 5-7/17 showed BUN=25-33, Cr=1.28-1.33 (stable on Lasix40-2Qam).    Hx severe DJD, Gout w/ tophi, s/p bilat TKRs> prev followed by [name]; on Allopurinol300, Tramadol50, Tylenol...    Derm> she has Psoriasis on her arms & has been evaluated by [name]/[name]; they also treated the LE cellulitis/ inflamm w/ wraps and creams, Atarax prn itching...    Anemia> Hosp 1/15 w/ gallstones and cholecystits (no surg); Hg= 7.5-9.4 at that time & Hg improved to 12.7; labs 7/16 showed Hg= 12.1, Fe=102 (32%sat), Ferritin=116, on FeSO4; labs 5-7/17 showed Hg=11.8=>9.9    Anxiety>  On Xanax 0.25- 1/2 to 1 tab tid prn... EXAM shows Afeb, VSS, O2sat=100% on RA: Wt= 131#; Heent- neg, mallampati1; Chest- decr BS w/o w/r/r; Heart- RR gr1/6SEM w/o r/g; Abd- soft, nontender, neg; Ext- VI, much improved w/ decr edema & erythema; Neuro- intact... IMP/PLAN>>  We discussed the poss need for further eval of the dizziness but for now she would like to continue the Meclizine prn 7 try the epley maneuvers (reviewed w/ pt & hand-out given); sheknows to be very careful w/ changing positions and ambulation;  She will continue the Pred '5mg'$  Qam & her Lasix4-=2Qam, avoid sodium, rov 244mo.           Problem List:  GLAUCOMA (ICD-365.9) - hx mult eye problems w/ prev corneal transplant & glaucoma laser eye surg... [facility].  ASTHMATIC BRONCHITIS, ACUTE (ICD-466.0) - Hx AB requiring Rx w/ Depo/ Pred/ Mucinex/ Tussionex... ~  CXR 4/11 showed vague nodular densities on left... ~  [facility]11:  [facility], sp[facility], & dx w/ pneumonia rx w/ ZPak... ~  CXR 3/12 showed borderline cardiomeg, nodular opac left base, NAD... ~  Spring 2013: she notes some allergy symptoms... ~  CXR 1/15 showed mild cardiomeg, atherosclerotic & tortuous Ao, low lung vol & sl incr markings, NAD...[name] ~  CXR 9/15 showed borderline cardiomeg, underlying chr lung dis, left base atx vs effusion, ?early RUL opac... ~  CXR 10/15 showed clearing of patchy RUL opac & left effusion, lungs now clear & back to baseline, NAD ~  CXR 3/16 in  hosp showed norm heart size, mild left base atx, otherw OK...   HYPERTENSION (ICD-401.9) - controlled on ASA '81mg'$ /d,  CARTIAXT '180mg'$ /d, LASIX '40mg'$ -  1-2Qam... ~  7/10:  labs showed BUN=54, Creat=2.0 & rec to decr diuretics in half= one Demedex & 1/2 Aldactone. ~  11/10:  labs showed BUN=38, Creat=1.8 & rec to keep same meds. ~  8/11:  labs showed BUN=42, Creat=2.1, K=4.3 ~  1/12:  labs in ER showed BUN=56, Creat=2.67, therefore diuretics decr to Demadex20/d only. ~  3/12:  2DEcho showed mod LVH, norm sys function w/ EF=60-65% & no regional wall motion abn; Gr1DD, mibile density on AoV- prob lambl'e escrescence, mildMR. ~  5/12:  f/u labs in office showed BUN=45, Creat=1.8, on Lasix '80mg'$  Qam for her edema... ~  9/12:  Labs by [name] 9/12 showed BUN=33, Creat=1.47, edema resolved therefore decr Lasix to '40mg'$ /d. ~  1/13:  BP= 160/76 but hasn't taken meds today; labs improved w/ BUN=26, Creat=1.3, BNP=50 ~  5/13:  BP= 140/78 & denies CP, palpit, SOB, edema; labs improved w/ BUN=28, Creat=1.2 ~  9/13:  BP= 140/60 & she denies CP, palpt, ch in SOB or edema... ~  12/13: controlled on Diltiazem180 & Furosemide '40mg'$ -2Qam w/ BP=138/62 today & she denies CP, palpit, dizzy, ch inSOB, etc... ~  3/14:  BP= 130/66 on same meds; Labs stable as well w/ Cr=1.5, continue same... ~  7/14: on Diltiazem180 & Furosemide '40mg'$ -2Qam w/ BP=150/64 today & she denies CP, palpit, dizzy, ch in SOB, etc. ~  11/14: BP=  126/64 on same meds, stable... ~  EKG 1/15 showed NSR, rate92, NSSTTWA, NAD... ~  3/15: on Diltiazem180 & Furosemide '40mg'$ -2Qam w/ BP=118/70 today & she denies CP, palpit, dizzy, ch in SOB, etc. ~  9/15: post hosp> off Diltiazem now & still on Furosemide '40mg'$ - 1-2Qam & K10-2/d w/ BP=146/60 w/ leg swelling, edema, cellulitis as below... ~  1/16: on Diltiazem180, Furosemide '40mg'$ -2Qam, & K10-2/d;  BP=132/64 today & she denies CP, palpit, dizzy, ch in SOB, etc. ~  3/16: on Diltiazem180, Furosemide '40mg'$ Qam, & off KCl since disch;  BP=134/62 today & she denies CP, palpit, dizzy, ch in SOB, etc ~  7/16: on Diltiazem180, Furosemide '40mg'$ Qam, & KCl Bid;  BP=120/70 & she remains asymptomatic...  PALPITATIONS, HX OF (ICD-V12.50) ~  3/16:  EKG showed NSR, rate86, PACs, poor r prog V1-2, NAD...  VENOUS INSUFFICIENCY/ EDEMA >> swelling controlled w/ diuretics, low sodium diet, elevation, support hose, etc... RIGHT LEG SWELLING > LEFT LEG... Pt notes it's been like this since her last child was born... Hx RECURRENT CELLULITIS >>  ~  She saw [name] [facility]- there was nothing he could do... ~  Swelling diminished w/ low sodium, elevation, support hose, & Lasix40 1-2tabs daily... ~  Nov-Dec2013: she knows to elim sodium, elevate, wear support hose; she has been seen by [facility], [name] & there is nothing they can do; recently checked by Payton Mccallum- DrJones w/ patch dressing & support hose; edema decr w/ Lasix '80mg'$ /d; Lab today shows Creat=1.5.[name]. ~  7/14:  she knows to elim sodium, elevate, wear support hose, & the Lasix80; weight is down 10# today to 141# w/ resolution of edema; [name] (Derm) treated dermatitis w/ new cream & improved. ~  11/14: wt is back up 8# to 149# today & she needs to elim sodium, elev legs, etc... ~  3/15: she knows to elim sodium, elevate, wear support hose, & the Lasix80; weight is back down 9# today to 137#.[name]. ~  9//15: she  was [facility] for cellulitis in LLE (NOS) w/ 4+edema etc; VenDopplers neg for  DVT; no open wounds/ drainage; treated w/ IV antibiotics and disch on Keflex; has persistent erythema, pain, swelling & we decided to continue the Keflex & refer to ID... ~  CT Abd&Pelvis 9/15> showed no acute abnormality & no venous obstruction or lymphocele to suggest a cause for left leg swelling; Cholelithiasis; unchanged bilateral adnexal cystic lesions (probably cysts); unchanged renal cysts and left renal atrophy; unchanged left perianal lipoma... ~  10/15: she is improved after the extended course of Keflex w/ decr erythema, swelling & discomfort; she did not see ID; plan is to continue same meds, cleansing, elevation, no salt, support hose... ~  3/16: she was Adm w/ right leg cellulitis- states she was out shopping & "over-did it", felt weak & tired then developed right leg discomfort, increased swelling/ redness/ etc; WBC was 21K, VenDopplers were neg for DVT, treated w/ Zosyn/ Vanco & changed to oral Doxy at disch;  She notes that her leg is improved- less swollen, less red, less painful but still sl tender; ~  07/2015: back to baseline... ~  02/2016: Recurrent redness/ swelling 01/2016- no better on Keflex from DrWert + topical wraps and creams from [place], Derm; we decided to treat w/ Pred10, incr Lasix80, no salt, Atarax for itching... ~  03/2016:  Much improved on the Pred10 + Lasix80 Qam; renal function is stable, the edema is diminished, and sed rate is now down to 15!  OK to wean Pred to '5mg'$  Qam...  HYPERCHOLESTEROLEMIA (ICD-272.0) - prev Rx'd by DrGegick... Now on ZOCOR '20mg'$ /d (prev Crestor5 & Lescol from [facility]) ~  she brought labs from [facility] 5/10 (off med due to $$$) w/ TChol 285, TG 204, HDL 69, LDL 195... "he was mad at me" ~  FLP 5/11 from [facility] showed TChol 202, TG 60, HDL 112, LDL 99 ~  She is reminded (again) to come FASTING for f/u FLP... ~  [place] 5/13 on Simva20 showed TChol 191, TG 81, HDL 82, LDL 93 ~  She is reminded to ret FASTING for f/u blood work...  HYPOTHYROIDISM  (ICD-244.9) - prev followed by DrGegick on SYNTHROID 179mg- 1/2 daily... ~  [facility]/ [facility]SH= 2.90, FreeT4= 1.09 ~  labs 8/11 showed TSH= 1.87 ~  Labs 3/12 showed TSH= 2.81 ~  Labs 1/13 showed TSH= 1.65 ~  Labs 3/14 showed TSH= 4.28 ~  Labs 3/16 showed TSH= 1.21  GERD (ICD-530.81) - uses PRILOSEC '20mg'$ /d... last EGD was 6/06 by DrPatterson showing 3cmHH, reflux...   DIVERTICULOSIS OF COLON (ICD-562.10) & COLONIC POLYPS (ICD-211.3) - last colonoscopy 6/06 was WNL- no abnormality seen...  GALLSTONE and CHOLECYSTITIS >>  ~  [facility]15[facility] w/ RUQ pain & dx w/ gallstones, cholecystitis, & had ERCP w/ sphincterotomy ([name]) & stone extraction; treated w/ Cipro, LFTs improved, course complicated by LE cellulitis requiring addition of Vanco IV; she had f/u [name], [facility] 2/15> brief note, did not rec elective surg given her age. ~  CT Abd&Pelvis 9/15> showed no acute abnormality & no venous obstruction or lymphocele to suggest a cause for left leg swelling; Cholelithiasis; unchanged bilateral adnexal cystic lesions (probably cysts); unchanged renal cysts and left renal atrophy; unchanged left perianal lipoma  RENAL INSUFFICIENCY (ICD-588.9) - tough balance betw renal perfusion & diuresis for edema... ~  labs 10/08 showed BUN= 27, Creat= 1.5 ~  labs 8/09 showed BUN= 44, Creat= 1.9 ~  labs  2/10 showed BUN 46, Creat= 1.7 ~  labs 7/10 showed BUN= 54, Creat= 2.0 ~  labs 11/10 showed BUN= 38, Creat= 1.8 ~  labs 8/11 showed BUN= 42, Creat= 2.1 ~  labs in ER 1/12 showed BUN=56, Creat=2.67, rec decr diuretics to Demadex'20mg'$ /d only. ~  Labs 3/12 showed improved renal function w/ BUN=31, Creat=1.4... ~  Labs 5/12 on Lasix80 showed BUN=45, Creat=1.8 ~  Labs 9/12 by [name] showed BUN=33, Creat=1.47 ~  Labs 1/13 showed BUN=26, Creat=1.3, BNP=50... On Lasix '40mg'$  Qam. ~  Labs 5/13 showed BUN=28, Creat=1.2 ~  Labs 12/13 on Lasix80 showed BUN=30, Creat=1.5 ~   Labs 3/14 on Lasix80 showed BUN=31, Creat=1.5 ~  Labs 6/14 by [name] on Lasix80 showed BUN=30, Cr=1.3 ~  Labs here 11/14 on Lasix80 showed BUN=31, Cr= 1.3 ~  She was [facility] 1/15 w/ cholecystitis & stones, s/p ERCP, and Creat- 1.1 - 1.7 ~  Labs 9/15 showed Cr= 0.94 to 1.4 range... ~  Labs 10/15 showed Cr= 1.3-1.4 ~  Labs 3/16 showed Cr= 1.52=>1.12 ~  Labs 7/16 showed BUN= 35, Cr= 1.13  DEGENERATIVE JOINT DISEASE (ICD-715.90) - this is her chief complaint- s/p right TKR in 1999,  left TKR 3/10... on MOBIC 7.'5mg'$  Prn & VICODIN Prn as well... followed by DrYates; and had right second toe amp by Arnette Norris 2010 for hammertoe + spurs... also had epidural steroid shot from [facility] for LBP (without benefit she says)... ~  5/13:  She saw [name] for Rheum w/ shot in hip & sl improved... 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 rec- start Vit D 50,000 u weekly... ~  labs 2/10 showed Vit D level = 30... rec- continue Vit D 50K weekly... ~  labs 7/10 showed Vit D level = 39... rec- change to 1000 u OTC daily. ~  labs 8/11 showed Vit D level = 38... continue same. ~  Labs 3/14 showed Vit D level = 43... Continue 1000u daily.  ANXIETY (ICD-300.00) - on ALPRAZOLAM 0.'25mg'$ Tid Prn... several deaths in the family... daughter who lives w/ her recently ill...  SHINGLES - she had right T9-10 shingles in 2011 & resolved w/ Valtrex, Pred, etc...   Past Surgical History:  Procedure Laterality Date  . ABDOMINAL HYSTERECTOMY    . BREAST BIOPSY     Benign  . CATARACT EXTRACTION    . ERCP N/A 10/06/2013   Procedure: ENDOSCOPIC RETROGRADE CHOLANGIOPANCREATOGRAPHY (ERCP);  Surgeon: Ladene Artist, MD;  Location: [place];  Service: Endoscopy;  Laterality: N/A;  . TOE AMPUTATION  08/2009   Right second toe - Dr Beola Cord  . TOTAL KNEE ARTHROPLASTY  1999   right  . TOTAL KNEE ARTHROPLASTY   11/2008   left - Dr Lorin Mercy    Outpatient Encounter Prescriptions as of 05/22/2016  Medication Sig Dispense Refill  . acetaminophen (TYLENOL) 325 MG tablet Take 325 mg by mouth every 4 (four) hours as needed for mild pain or moderate pain.     [name] allopurinol (ZYLOPRIM) 300 MG tablet Take 1 tablet by mouth daily.    [name] ALPRAZolam (XANAX) 0.25 MG tablet Take 1/2 to 1 tablet by mouth 3 times daily as needed for anxiety 90 tablet 5  . Ascorbic Acid (VITAMIN C) 500 MG tablet Take 500 mg by mouth daily.      [name] aspirin 81 MG tablet Take 81 mg  by mouth daily.      . Cholecalciferol (VITAMIN D) 1000 UNITS capsule Take 1,000 Units by mouth daily.      [name] diltiazem (CARDIZEM CD) 180 MG 24 hr capsule TAKE ONE CAPSULE BY MOUTH EVERY DAY 90 capsule 3  . ferrous sulfate 325 (65 FE) MG tablet Take 1 tablet (325 mg total) by mouth 2 (two) times daily with a meal. 60 tablet 2  . folic acid (FOLVITE) 1 MG tablet Take 1 tablet (1 mg total) by mouth daily. 30 tablet 1  . furosemide (LASIX) 40 MG tablet Take 2 tablets every AM 60 tablet 3  . hydrOXYzine (ATARAX/VISTARIL) 25 MG tablet Take 1 tablet (25 mg total) by mouth every 6 (six) hours as needed. 50 tablet 0  . levothyroxine (SYNTHROID, LEVOTHROID) 125 MCG tablet TAKE 1/2 TABLET BY MOUTH DAILY BEFORE BREAKFAST 90 tablet 3  . meclizine (ANTIVERT) 25 MG tablet Take 1/2-1 tablet by mouth every 4 hours as needed for dizziness 50 tablet 0  . Multiple Vitamin (MULTIVITAMIN WITH MINERALS) TABS tablet Take 1 tablet by mouth daily. 30 tablet 0  . mupirocin ointment (BACTROBAN) 2 % 1 APPLICATION APPLY ON THE SKIN AS DIRECTED APPLY TO AFFECTED AREA ON HAND AND COVER AS DIRECTED  0  . Omega-3 Fatty Acids (FISH OIL) 1000 MG CAPS Take 1 capsule by mouth daily.    [name] omeprazole (PRILOSEC) 20 MG capsule Take 20 mg by mouth daily.      . ondansetron (ZOFRAN) 8 MG tablet Take 1 tablet (8 mg total) by mouth every 6 (six) hours as needed for nausea or vomiting. 20 tablet 0  . Polyethyl  Glycol-Propyl Glycol (SYSTANE) 0.4-0.3 % SOLN Place 1 drop into both eyes 4 (four) times daily.     . potassium chloride (KLOR-CON M10) 10 MEQ tablet TAKE 2 TABLETS (20 MEQ TOTAL) BY MOUTH DAILY. 180 tablet 3  . predniSONE (DELTASONE) 10 MG tablet Take 1/2 tablet (5 mg total) by mouth daily with breakfast. 50 tablet 1  . simvastatin (ZOCOR) 20 MG tablet TAKE 1 TABLET (20 MG TOTAL) BY MOUTH DAILY. 90 tablet 3  . thiamine 100 MG tablet Take 1 tablet (100 mg total) by mouth daily. 30 tablet 1  . traMADol (ULTRAM) 50 MG tablet Take 1 tablet (50 mg total) by mouth every 8 (eight) hours as needed. 90 tablet 0  . loperamide (IMODIUM) 2 MG capsule Take 1 capsule (2 mg total) by mouth as needed for diarrhea or loose stools. (Patient not taking: Reported on 04/03/2016) 10 capsule 0    Allergies  Allergen Reactions  . Enablex [Darifenacin Hydrobromide Er] Other (See Comments)    Caused her throat and mouth to have bumps and feel like it was swelling  . Clarithromycin Swelling    REACTION: causes her mouth to swell  . Codeine Nausea Only  . Morphine Nausea And Vomiting  . Sulfonamide Derivatives Other (See Comments)    REACTION: unsure of reaction    Current Medications, Allergies, Past Medical History, Past Surgical History, Family History, and Social History were reviewed in [employer] record.    Review of Systems        See HPI - all other systems neg except as noted...  The patient complains of dyspnea on exertion, peripheral edema, muscle weakness, and difficulty walking.  The patient denies anorexia, fever, weight loss, weight gain, vision loss, decreased hearing, hoarseness, chest pain, syncope, prolonged cough, headaches, hemoptysis, abdominal pain, melena, hematochezia, severe indigestion/heartburn, hematuria,  incontinence, genital sores, suspicious skin lesions, transient blindness, depression, unusual weight change, abnormal bleeding, enlarged lymph nodes, and  angioedema.     Objective:   Physical Exam      WD, WN, 82 y/o WF in NAD... GENERAL:  Alert & oriented; pleasant & cooperative... HEENT:  [place]/AT, EOM- full, EACs-clear, TMs-wnl, NOSE-clear, THROAT-clear & wnl. NECK:  Supple w/ fairROM; no JVD; normal carotid impulses w/o bruits; no thyromegaly or nodules palpated; no lymphadenopathy. CHEST:  Clear, decr BS bilat w/o wheezing, rales, or rhonchi heard... HEART:  Regular Rhythm;  gr 1/6 SEM without rubs or gallops detected... ABDOMEN:  Soft & nontender; normal bowel sounds; no organomegaly or masses detected. EXT:  moderate arthritic changes; s/p bilat TKRs; mild varicose veins/ +venous insuffic/ 1+edema Left leg, min red & tender... s/p right second toe distal amputation... NEURO:  CN's intact;  no focal neuro deficits... DERM:  No lesions noted; no rash etc...  RADIOLOGY DATA:  Reviewed in the EPIC EMR & discussed w/ the patient...  LABORATORY DATA:  Reviewed in the EPIC EMR & discussed w/ the patient...   Assessment & Plan:    Hx Left LEG CELLULITIS, then Right LEG CELLULITIS>> ?Etiology (NOS), no broken skin or draining areas; treated empirically in [facility] 1/15 & disch on Keflex w/ persistent red/ swollen/ tender left leg; we continued the Keflex for 2wks longer & wanted ID consult; she never got the consult but improved on the Keflex; now off the antibiotic w/ decr swelling, erythema, discomfort; REC to continue meds, elevation, no salt, support hose, etc...  12/15> she reports left leg red, incr swelling, & drainage- went to Derm, [name] & given cream which she reports has helped... 3/16> she was hosp w/ right leg cellulitis- swollen, red, inflammed, tender- but again NOS, no broken skin, etc; treated w/ Zosyn/ Vanco & disch on Doxy=> improved... 7/16> stable w/o recurrent soft tissue infection 11//16> at baseline... 6/17> Recurrent redness/ swelling 01/2016- no better on Keflex from DrWert + topical wraps and creams from [name],  Derm; we decided to treat w/ Pred10, incr Lasix80, no salt, Atarax for itching 7/17> Rec to contuinue low sodium diet & the Lasix40- 2tabs Qam;  OK to wean the Pred '10mg'$  tabs to 1/2 tab Qam & stay on this!  ROV in 1 month... 8/17> We discussed the poss need for further eval of the dizziness but for now she would like to continue the Meclizine prn & try the epley maneuvers (reviewed w/ pt & hand-out given); sheknows to be very careful w/ changing positions and ambulation;  She will continue the Pred '5mg'$  Qam & her Lasix4-=2Qam, avoid sodium, rov 62mo   URI/ Bronchitis> she presented 1/16 w/ sore throat, cough, yellow sput & we decided to treat w/ Levaquin x5d, Depo80, Pred dosepak; she may also use Mucinex, fluids, & MMW if needed...  HBP>  Controlled on meds, continue same including the Lasix40Qam...  Cardiac>  Prev DrRoss' note reviewed; see 2DEcho, Event Monitor reports no dangerous arrhythmias, continue meds...  Ven Insuffic/ EDEMA- improved>  evals by Cards & VVS> "there is nothing they can do"; continue elevation, no salt, Lasix80 now, compression...  CHOL>  On Simva20 & FLP 5/13 looked good...  HYPOTHYROID>  Continue Synthroid 1234m/d taking 1/2 tab daily...  GI>  Stable now s/p ERCP for gallstones; followed by [name] now... Episode of cholecystitis, gallstones 1/15- s/p ERCP & improved w/o surg ([name], [name])...  Renal Insuffic>  Careful w/ diuresis, NSAIDs etc; Creat varied 1.1 to 1.7  DJD>  She saw [name] for her Rheum complaints==> shot in knee; DrYates/ Ernestina Patches have signed off; may need pain clinic....  Other problems as noted => Dizziness, no meclizine, try epley maneuvers, consider further eval if needed.   Patient's Medications  New Prescriptions   No medications on file  Previous Medications   ACETAMINOPHEN (TYLENOL) 325 MG TABLET    Take 325 mg by mouth every 4 (four) hours as needed for mild pain or moderate pain.    ALLOPURINOL (ZYLOPRIM) 300 MG TABLET     Take 1 tablet by mouth daily.   ALPRAZOLAM (XANAX) 0.25 MG TABLET    Take 1/2 to 1 tablet by mouth 3 times daily as needed for anxiety   ASCORBIC ACID (VITAMIN C) 500 MG TABLET    Take 500 mg by mouth daily.     ASPIRIN 81 MG TABLET    Take 81 mg by mouth daily.     CHOLECALCIFEROL (VITAMIN D) 1000 UNITS CAPSULE    Take 1,000 Units by mouth daily.     DILTIAZEM (CARDIZEM CD) 180 MG 24 HR CAPSULE    TAKE ONE CAPSULE BY MOUTH EVERY DAY   FERROUS SULFATE 325 (65 FE) MG TABLET    Take 1 tablet (325 mg total) by mouth 2 (two) times daily with a meal.   FOLIC ACID (FOLVITE) 1 MG TABLET    Take 1 tablet (1 mg total) by mouth daily.   FUROSEMIDE (LASIX) 40 MG TABLET    Take 2 tablets every AM   HYDROXYZINE (ATARAX/VISTARIL) 25 MG TABLET    Take 1 tablet (25 mg total) by mouth every 6 (six) hours as needed.   LEVOTHYROXINE (SYNTHROID, LEVOTHROID) 125 MCG TABLET    TAKE 1/2 TABLET BY MOUTH DAILY BEFORE BREAKFAST   LOPERAMIDE (IMODIUM) 2 MG CAPSULE    Take 1 capsule (2 mg total) by mouth as needed for diarrhea or loose stools.   MECLIZINE (ANTIVERT) 25 MG TABLET    Take 1/2-1 tablet by mouth every 4 hours as needed for dizziness   MULTIPLE VITAMIN (MULTIVITAMIN WITH MINERALS) TABS TABLET    Take 1 tablet by mouth daily.   MUPIROCIN OINTMENT (BACTROBAN) 2 %    1 APPLICATION APPLY ON THE SKIN AS DIRECTED APPLY TO AFFECTED AREA ON HAND AND COVER AS DIRECTED   OMEGA-3 FATTY ACIDS (FISH OIL) 1000 MG CAPS    Take 1 capsule by mouth daily.   OMEPRAZOLE (PRILOSEC) 20 MG CAPSULE    Take 20 mg by mouth daily.     ONDANSETRON (ZOFRAN) 8 MG TABLET    Take 1 tablet (8 mg total) by mouth every 6 (six) hours as needed for nausea or vomiting.   POLYETHYL GLYCOL-PROPYL GLYCOL (SYSTANE) 0.4-0.3 % SOLN    Place 1 drop into both eyes 4 (four) times daily.    POTASSIUM CHLORIDE (KLOR-CON M10) 10 MEQ TABLET    TAKE 2 TABLETS (20 MEQ TOTAL) BY MOUTH DAILY.   PREDNISONE (DELTASONE) 10 MG TABLET    Take 1/2 tablet (5 mg total) by  mouth daily with breakfast.   SIMVASTATIN (ZOCOR) 20 MG TABLET    TAKE 1 TABLET (20 MG TOTAL) BY MOUTH DAILY.   THIAMINE 100 MG TABLET    Take 1 tablet (100 mg total) by mouth daily.   TRAMADOL (ULTRAM) 50 MG TABLET    Take 1 tablet (50 mg total) by mouth every 8 (eight) hours as needed.  Modified Medications   No medications on file  Discontinued  Medications   No medications on file

## 2016-05-28 DIAGNOSIS — H401134 Primary open-angle glaucoma, bilateral, indeterminate stage: Secondary | ICD-10-CM | POA: Diagnosis not present

## 2016-06-04 ENCOUNTER — Other Ambulatory Visit: Payer: Self-pay | Admitting: Pulmonary Disease

## 2016-06-12 ENCOUNTER — Telehealth: Payer: Self-pay | Admitting: Pulmonary Disease

## 2016-06-12 NOTE — Telephone Encounter (Signed)
SN--- pt lost her papers that you gave her for the exercises for the epley maneuver and wanted to know if we could reprint these for her.  Please advise. thanks

## 2016-06-13 NOTE — Telephone Encounter (Signed)
Called and spoke with pt and she is aware that SN reprinted the epley maneuver exercises.  She requested that these be mailed to her and this has been done.

## 2016-06-24 DIAGNOSIS — I8311 Varicose veins of right lower extremity with inflammation: Secondary | ICD-10-CM | POA: Diagnosis not present

## 2016-06-24 DIAGNOSIS — Z85828 Personal history of other malignant neoplasm of skin: Secondary | ICD-10-CM | POA: Diagnosis not present

## 2016-06-24 DIAGNOSIS — I872 Venous insufficiency (chronic) (peripheral): Secondary | ICD-10-CM | POA: Diagnosis not present

## 2016-06-24 DIAGNOSIS — I8312 Varicose veins of left lower extremity with inflammation: Secondary | ICD-10-CM | POA: Diagnosis not present

## 2016-06-28 ENCOUNTER — Ambulatory Visit (INDEPENDENT_AMBULATORY_CARE_PROVIDER_SITE_OTHER): Payer: Medicare Other

## 2016-06-28 DIAGNOSIS — Z23 Encounter for immunization: Secondary | ICD-10-CM | POA: Diagnosis not present

## 2016-07-15 ENCOUNTER — Ambulatory Visit (INDEPENDENT_AMBULATORY_CARE_PROVIDER_SITE_OTHER): Payer: Medicare Other | Admitting: Pulmonary Disease

## 2016-07-15 ENCOUNTER — Encounter: Payer: Self-pay | Admitting: Pulmonary Disease

## 2016-07-15 VITALS — BP 110/68 | HR 85 | Temp 97.4°F | Ht 63.0 in | Wt 129.4 lb

## 2016-07-15 DIAGNOSIS — I10 Essential (primary) hypertension: Secondary | ICD-10-CM

## 2016-07-15 DIAGNOSIS — Z872 Personal history of diseases of the skin and subcutaneous tissue: Secondary | ICD-10-CM

## 2016-07-15 DIAGNOSIS — R609 Edema, unspecified: Secondary | ICD-10-CM

## 2016-07-15 DIAGNOSIS — F411 Generalized anxiety disorder: Secondary | ICD-10-CM

## 2016-07-15 DIAGNOSIS — M159 Polyosteoarthritis, unspecified: Secondary | ICD-10-CM

## 2016-07-15 DIAGNOSIS — I872 Venous insufficiency (chronic) (peripheral): Secondary | ICD-10-CM

## 2016-07-15 DIAGNOSIS — D638 Anemia in other chronic diseases classified elsewhere: Secondary | ICD-10-CM

## 2016-07-15 DIAGNOSIS — N183 Chronic kidney disease, stage 3 unspecified: Secondary | ICD-10-CM

## 2016-07-15 DIAGNOSIS — M150 Primary generalized (osteo)arthritis: Secondary | ICD-10-CM

## 2016-07-15 DIAGNOSIS — K219 Gastro-esophageal reflux disease without esophagitis: Secondary | ICD-10-CM

## 2016-07-15 DIAGNOSIS — K5730 Diverticulosis of large intestine without perforation or abscess without bleeding: Secondary | ICD-10-CM

## 2016-07-15 DIAGNOSIS — E039 Hypothyroidism, unspecified: Secondary | ICD-10-CM

## 2016-07-15 DIAGNOSIS — I359 Nonrheumatic aortic valve disorder, unspecified: Secondary | ICD-10-CM

## 2016-07-15 MED ORDER — POTASSIUM CHLORIDE CRYS ER 10 MEQ PO TBCR: EXTENDED_RELEASE_TABLET | ORAL | 3 refills | Status: DC

## 2016-07-15 MED ORDER — MECLIZINE HCL 25 MG PO TABS: ORAL_TABLET | ORAL | 0 refills | Status: DC

## 2016-07-15 MED ORDER — TRAMADOL HCL 50 MG PO TABS: 50.0000 mg | ORAL_TABLET | Freq: Three times a day (TID) | ORAL | 5 refills | Status: DC | PRN

## 2016-07-15 NOTE — Patient Instructions (Signed)
Today we updated your med list in our EPIC system...    Continue your current medications the same...  For your insomnia> try the OTC Melatonin or Benedryl as needed...    If neceessary your may use the ALPRAZLAM 1/2 tab at bedtime...  For the allergy symptoms & sneezing> use the OTC Zyrtek or Claritin as needed...  Call for any questions...  Let's plan a follow up visit in 42mo, sooner if needed for problems.Marland KitchenMarland Kitchen

## 2016-07-15 NOTE — Progress Notes (Signed)
Subjective:    Patient ID: [name], female    DOB: January 07, 1915, 82 y.o.   MRN: [id number]  HPI 82 y/o WF here for a follow up visit... she has mult med problems including:  Hx asthmatic bronchitis;  HBP;  Ven Insuffic & edema;  Hyperchol;  Hypothyroid;  GERD/ Divertics/ Colon polyps;  Renal insuffic;  DJD;  Vit D defic;  Anxiety...  ~  SEE PREV EPIC NOTES FOR OLDER DATA >>    LABS 11/14:  Chems- ok w/ BS=108, Cr=1.3;  Uric=4.3 on Allopurinol100;  CBC- ok w/ Hg=12.6.Judith KitchenMarland Anderson  December 15, 2013:  1053moROV & [facility] w/ RUQ pain & dx w/ gallstones, cholecystitis, & had ERCP w/ sphincterotomy (DrStark) & stone extraction; treated w/ Cipro, LFTs improved, course complicated by LE cellulitis requiring addition of Vanco IV; she had f/u DrCornett, [facility] 2/15> brief note, did not rec elective surg given her age...  June 14, 2014:  [facility] by [facility] w/ sudden fever to 103, chills, and left leg pain/ redness/ swelling=> c/w severe cellulitis; she was felt to be septic but no lesions on leg, no areas of broken skin, wearing support hose, her Lasix dose was cut from 890mam to 4023m, other meds the same...   CXR 9/15 showed borderline cardiomeg, underlying chr lung dis, left base atx vs effusion, ?early RUL opac...   EKG 9/15 showed NSR w/ PACs, rate92, NSSTTWA, NAD...  MRI of left leg 9/15> diffuse SQ soft tissue swelling & edema, no discrete abscess identified, no osteomyelitis, no signs of septic arthritis, etc (c/w severe cellulitis).  Ven Dopplers 9/15> no evid of DVT or superficial thrombosis in left leg, enlarged LN's noted in left groin  LABS 9/15 in Hosp> Hg=10-11 range, WBC=29K=>11.7 by disch, BUN/Cr= 39/1.3 improved to 27/0.9 by disch, MRSA=neg, UA=clear...  LABS 9/15 in office f/u>  Chems- ok x Na=131, Cr=1.4;  CBC- ok x   CT Abd&Pelvis 9/15> showed no acute abnormality & no venous obstruction or lymphocele to suggest a cause for left leg  swelling; Cholelithiasis; unchanged bilateral adnexal cystic lesions (probably cysts); unchanged renal cysts and left renal atrophy; unchanged left perianal lipoma...  CXR 10/15 showed clearing of patchy RUL opac & left effusion, lungs now clear & back to baseline, NAD...  LABS 101/15:  Chems- ok x Na=132, BUN=33, Cr=1.3;  CBC- ok w/ Hg=12.0, WBC=7.6;  Sed=54 (improved from 90)...  ~  December 06, 2014:  53mo74mo & [facility] hospital check> [name] by [facility]ad w/ right leg cellulitis- states she was out shopping & "over-did it", felt weak & tired then developed right leg discomfort, increased swelling/ redness/ etc; WBC was 21K, VenDopplers were neg for DVT, treated w/ Zosyn/ Vanco & changed to oral Doxy at disch;  She notes that her leg is improved- less swollen, less red, less painful but still sl tender; she notes that right leg first started giving her trouble after the birth of her last child; she had thorough vasc eval by DrEarly in the past and no surgery indicated; she knows to elim salt/sodium, elev legs, wear support hose; she has finished the Doxy100Bid given at disch & she misunderstood the DC directions and has been taking her prev Lasix80mg43m... We reviewed the following medical problems during today's office visit >> NOTE: as an afterthought she noted vag itching & wanted Rx- offered Diflucan orally & if not improved she will need Gyn eval (she  has Atarax as well on her med list)...     HBP> on Diltiazem180, Furosemide 90mQam, & off KCl since disch;  BP=134/62 today & she denies CP, palpit, dizzy, ch in SOB, etc...    VI, Edema, Cellulitis> she knows to elim sodium, elevate, wear support hose; still taking Lasix80Qam; Hosp 3/16 as above & disch on Doxy- now out & right leg improved she says; RLE chr larger than LLE, sl red/ tender; Rec decr Lasix40.     CHOL> on Simva20 Qhs + low fat diet; last FLP 5/13 showed TChol 191, TG 81, HDL 82, LDL 93... Needs to ret fasting for f/u  blood work.    Hypothy> on Synthroid 12101m- 1/2 tab daily; labs 3/16 showed TSH= 1.21    Renal Insuffic> Creat prev up to 1.8 on the Lasix 8028mCreat 3/16= 1.52=>1.12    Derm> she has Psoriasis on her arms & has been evaluated by DrJordan/Jones; they also treated the LE cellulitis; Rec Atarax prn itching...    Anemia> Hosp 1/15 w/ gallstones and cholecystits (no surg); Hg= 7.5-9.4 at that time & Hg improved to 12.7; labs 3/16 showed Hg= 13.2=>10.6, Fe=13 (7%sat), Ferritin=104, on FeSO4... We reviewed prob list, meds, xrays and labs> see below for updates >>   CXR 3/16 showed norm heart size, mild left base atx, otherw OK...   EKG 3/16 showed NSR, rate86, PACs, poor r prog V1-2, NAD... Judith KitchenMarland KitchenVen Dopplers 3/16 were neg for DVT...   2DEcho 3/16 showed mild LVH w/ mild focal basal septal hypertrophy, norm LVF w/ EF=60-65%, norm wall motion, Gr1DD, norm AoV, mild MR, mild LA dil, norm RV, PAsys=38...  LABS 3.16:  Chems- ok x BUN=37 due to Lasix80 she was on & we decr her to 17m46m CBC- wnl,  Sed=65;  PCT<0.10 indicating +inflamm but no infection now...  PLAN>> we decided to check f/u labs (BMet, CBC, Sed, PCT); Rec to apply cool compresses to right leg several times daily, elim salt/sodium, elev legs, wear support hose; recurrent cellulitis episodes may require suppressive antibiotic therapy... Labs ret w/ BUN=37 7 we decr her Lasix from 80=>40/d; elev Sed w/ neg PCT & we are holding any further antibiotics for now...  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 We reviewed the following medical problems during today's office visit >>     HBP> on Diltiazem180, Furosemide 17mgQ85m& KCl Bid;  BP=120/70  today & she denies CP, palpit, dizzy, ch in SOB, etc...    VI, Edema, Cellulitis> she knows to elim sodium, elevate, wear support hose; still taking Lasix40Qam; Hosp 3/16 as above & disch on Doxy- right leg improved she says; RLE chr larger than LLE, sl red/ tender; Rec continue Lasix40.     CHOL> on Simva20 Qhs + low fat diet; last FLP 5/13 showed TChol 191, TG 81, HDL 82, LDL 93... Needs to ret fasting for f/u blood work.    Hypothy> on Synthroid 125mcg-64m tab daily; labs 3/16 showed TSH= 1.21    Renal Insuffic> Creat prev up to 1.8 on Lasix 80mg; C45m 3/16= 1.52=>1.12; Labs 7/16 stable w/ BUN=35, Cr=1.13    Hx severe DJD, Gout w/ tophi, s/p bilat  TKRs> prev followed by Ladene Artist; on Allopurinol, Tramadol, Tylenol...    Derm> she has Psoriasis on her arms & has been evaluated by DrJordan/Jones; they also treated the LE cellulitis; Rec Atarax prn itching...    Anemia> Hosp 1/15 w/ gallstones and cholecystits (no surg); Hg= 7.5-9.4 at that time & Hg improved to 12.7; labs 7/16 showed Hg= 12.1, Fe=102 (32%sat), Ferritin=116, on FeSO4/d... We reviewed prob list, meds, xrays and labs> see below for updates >>   LABS 7/16:  Chems- ok w/ BUN=35, Cr=1.13, BS=100, BNP=268;  CBC- ok w/ Hg=12.1, Fe=102 (32%), Ferritin=116...  ~  August 07, 2015:  42moROV & post-ER follow up visit> [place] c/o HA rated 8/10> frontal HA, assoc n/v, felt weak & unsteady; BP was ok, VS were stable, Labs ok, CT Head was neg- wnl; Tylenol helped... we reviewed the following medical problems during today's office visit >>     HBP> on Diltiazem180, Furosemide 436mam, & KCl Bid;  BP=124/60 today & she denies CP, palpit, dizzy, ch in SOB, etc...    VI, Edema, Hx cellulitis> she knows to elim sodium, elevate, wear support hose; on Lasix40Qam; [facility] on D[facility]- right leg improved; RLE chr larger than LLE, sl red/ tender; Rec continue Lasix40.     CHOL> on Simva20 Qhs + low fat diet; last FLP 5/13  showed TChol 191, TG 81, HDL 82, LDL 93... Needs to ret fasting for f/u blood work.    Hypothy> on Synthroid 12511m 1/2 tab daily; labs 3/16 showed TSH= 1.21    Renal Insuffic> Creat prev up to 1.8 on Lasix 74m32mreat 3/16= 1.52=>1.12; Labs 7/16 stable w/ BUN=35, Cr=1.13    Hx severe DJD, Gout w/ tophi, s/p bilat TKRs> prev followed by DrDeveshwar; on Allopurinol, Tramadol, Tylenol...    Derm> she has Psoriasis on her arms & has been evaluated by DrJordan/Jones; they also treated the LE cellulitis; Rec Atarax prn itching...    Anemia> Hosp 1/15 w/ gallstones and cholecystits (no surg); Hg= 7.5-9.4 at that time & Hg improved to 12.7; labs 7/16 showed Hg= 12.1, Fe=102 (32%sat), Ferritin=116, on FeSO4/d...    Anxiety>  On Xanax 0.25- 1/2 to 1 tab tid prn... EXAM shows Afeb, VSS, O2sat=96% on RA:  Heent- neg, mallampati1;  Chest- clear w/o w/r/r;  Heart- RR gr1/6SEM w/o r/g;  Abd- soft, nontender, neg;  Ext- VI, 1+edema, ecchymoses;  Neuro- no focal neuro deficits...   CT Head 07/20/15>  Intact, WNL, NAD...  Labs 06/2015>  Chems- wnl;  CBC- wnl... IMP/PLAN>>  OK Flu vaccine today;  Continue same meds....  ~  February 28, 2016:  60mo 33mo& Kallen was [facility] by [facility] w/ ?cellulitis right leg, she had been seeing Derm & Rx w/ creams but no better; she was Afeb, WBC=8.4K, Sed=44; he prescribed Keflex but no better she says & pt has followed up w/ Derm=> legs wrapped Qwk now, swelling worse, wt up 7# to 142# today => this time Rx w/ Pred10, Atarax, incr Lasix80 & no salt...    HBP> on Diltiazem180, Furosemide 40mgQ98m& KCl 10Bid;  BP=120/60 today & she denies CP, palpit, dizzy, ch in SOB, etc...    VI, Edema, Hx cellulitis> she knows to elim sodium, elevate, wear support hose; on Lasix40Qam; Hosp 3/16 & disch on Doxy; Hx cellulitis & non-infection inflammation treated w/ diff antibiotics and Pred, Lasix, no salt, wraps & creams from Derm DScherrie Merrittsisits, and us...KoreaMarland KitchenMarland Anderson  CHOL> on Simva20 Qhs + low fat  diet; last FLP 5/13 showed TChol 191, TG 81, HDL 82, LDL 93... Needs to ret fasting for f/u blood work.    Hypothy> on Synthroid 123mg- 1/2 tab daily; labs 3/16 showed TSH= 1.21    Renal Insuffic> Creat prev up to 1.8 on Lasix 813m Creat 3/16= 1.52=>1.12; Labs 2016 stable w/ BUN=23-37, Cr=1.13-1.28; Labs 5/17 showed BUN=25, Cr=1.33    Hx severe DJD, Gout w/ tophi, s/p bilat TKRs> prev followed by DrDeveshwar; on Allopurinol, Tramadol, Tylenol...    Derm> she has Psoriasis on her arms & has been evaluated by DrJordan/Jones; they also treated the LE cellulitis/ inflamm w/ wraps and creams, Atarax prn itching...    Anemia> Hosp 1/15 w/ gallstones and cholecystits (no surg); Hg= 7.5-9.4 at that time & Hg improved to 12.7; labs 7/16 showed Hg= 12.1, Fe=102 (32%sat), Ferritin=116, on FeSO4/d; labs 5/16 showed Hg=11.8    Anxiety>  On Xanax 0.25- 1/2 to 1 tab tid prn... EXAM shows Afeb, VSS, O2sat=98% on RA:  Heent- neg, mallampati1;  Chest- decr BS w/o w/r/r;  Heart- RR gr1/6SEM w/o r/g;  Abd- soft, nontender, neg;  Ext- VI, +edema to above knees, leg wrapped;  Neuro- intact... IMP/PLAN>>  Not better w/ Keflex, looks more inflammed than infected=> Rx w/ Pred10, Atarax25 prn, incr Lasix40-2Qam & no salt, ROV in 2wks...   ~  April 03, 2016:  [place] c/o chest congestion, wheezing, cough, sl yellow sput, ?low grade fever; had already been to [facility]; CXR showed diffuse peribronchial cuffing, no airsp dis; given Augmentin, bump Pred, Mucinex;  She states "I was never that sick before" but breathing is fine now & she denies CP/ change is SOB/ cough/ sputum, f/c/s, etc;  But while her breathing is improved- her left leg is swollen & painful again- NOTE: she ran out of Pred 1025m 2 weeks ago!  We decided to check labs>  Chems- ok x BUN=27, Cr=1.51;  CBC- anemic w/ Hg=9.9, MCV=98, WBC=7.8; Sed=70 (up from 44);  Rx w/ Lasix80 & Pred10m19magain; ROV in 2-3wks and continue follow up  w/ Derm...  ~  April 18, 2016:  2wk ROV [place] that her legs are much improved, decr swelling, wt down 5# more to 129# today; skin is improved w/ the Pred10- less red, no open areas, no weeping, no cellulitis, no f/c/s;  She went back to [employer] wraps used just topical cream which didn't seem to help much previously;  She spent a lot of the visit talking about her glaucoma- [facility]ishman at [facility] he can do, [place] wanted me to know that she is back on eye drops now;  We rechecked labs today>  Chems- ok w/ BUN=33, Cr=1.28;  TSH=1.42;  Sed=15 now on Pred 10mg93m.     EXAM shows Afeb, VSS, O2sat=100% on RA:  Heent- neg, mallampati1;  Chest- decr BS w/o w/r/r;  Heart- RR gr1/6SEM w/o r/g;  Abd- soft, nontender, neg;  Ext- VI, much improved w/ decr edema & erythema;  Neuro- intact... IMP/PLAN>>  Rec to contuinue low sodium diet & the Lasix40- 2tabs Qam;  OK to wean the Pred 10mg 58m to 1/2 tab Qam & stay on this!  ROV in 1 month...  ~  May 22, 2016:  43mo RO24moVirginia's CC is intermittent dizziness, using Meclizine w/ some improvement, fell onto  her bed one time- reminded to change positions slowly, careful w/ ambulation & may need cane or walker if not stable;  She also notes that she is [facility] & set up an appt for an eval that she saw in the newspaper recently...  We reviewed the following medical problems during today's office visit >>     HBP> on Diltiazem180, Furosemide '40mg'$ Qam, & KCl 10Bid;  BP=120/60 today & she denies CP, palpit, dizzy, ch in SOB, etc...    VI, Edema, Hx cellulitis> she knows to elim sodium, elevate, wear support hose; on Lasix40-2Qam & PRED10-1/2Qam; [facility] 3/16 & disch on Doxy; Hx cellulitis & non-infection inflammation treated w/ diff antibiotics and Pred, Lasix, no salt, wraps & creams from Derm DrJordan, ER visits, and us=> now improved on tapering course of Pred- rec to continue '5mg'$ /d for now (Sed=15)...      CHOL> off Simva20 & on low fat diet; last FLP 5/13 showed TChol 191, TG 81, HDL 82, LDL 93... Needs to ret fasting for f/u blood work but she does not want Chol meds!    Hypothy> on Synthroid 137mg-1/2 tab daily; labs 7/17 showed TSH= 1.42 & rec to continue same...    Renal Insuffic> Creat prev up to 1.8 on Lasix '80mg'$ ; Creat 3/16= 1.52=>1.12; Labs 2016 stable w/ BUN=23-37, Cr=1.13-1.28; Labs 5-7/17 showed BUN=25-33, Cr=1.28-1.33 (stable on Lasix40-2Qam).    Hx severe DJD, Gout w/ tophi, s/p bilat TKRs> prev followed by DrDeveshwar; on Allopurinol300, Tramadol50, Tylenol...    Derm> she has Psoriasis on her arms & has been evaluated by DrJordan/Jones; they also treated the LE cellulitis/ inflamm w/ wraps and creams, Atarax prn itching...    Anemia> Hosp 1/15 w/ gallstones and cholecystits (no surg); Hg= 7.5-9.4 at that time & Hg improved to 12.7; labs 7/16 showed Hg= 12.1, Fe=102 (32%sat), Ferritin=116, on FeSO4; labs 5-7/17 showed Hg=11.8=>9.9    Anxiety>  On Xanax 0.25- 1/2 to 1 tab tid prn... EXAM shows Afeb, VSS, O2sat=100% on RA: Wt= 131#; Heent- neg, mallampati1; Chest- decr BS w/o w/r/r; Heart- RR gr1/6SEM w/o r/g; Abd- soft, nontender, neg; Ext- VI, much improved w/ decr edema & erythema; Neuro- intact... IMP/PLAN>>  We discussed the poss need for further eval of the dizziness but for now she would like to continue the Meclizine prn & try the epley maneuvers (reviewed w/ pt & hand-out given); she knows to be very careful w/ changing positions and ambulation;  She will continue the Pred '5mg'$  Qam & her Lasix4-=2Qam, avoid sodium, rov 21mo.    ~  July 15, 2016:  50m63moV & Aubreyana reports that the AntHeron Lakeneuvers have helped her dizziness some; she has had a lot of problems w/ her eyes- [facility] me she's had a reaction to the eye drops & they are adjusting (she feels this is responsible for some of her dizziness);  She has mult somatic  complaints- insomnia (rec melatonin/ benedryl), sneezing (rec zyrtek/ claritin), forgetfulness (she is 89 23o & still pretty sharp)...     BP is controlled on her Diltiazem180, Furosemide40, K10Bid; BP=110/68 & she remains asymptomatic x the dizziness...    Weight is stable & legs look good on her Lasix, low sodium diet, & the Pred5/d...    Renal is stable w/ last Cr=1.28 on 03/2016...    Hg is stable w/ last CBC 03/2016 showing Hg~10... EXAM shows Afeb, VSS, O2sat=98% on RA: Wt= 130#, stable; Heent- neg, mallampati1; Chest- decr  BS w/o w/r/r; Heart- RR gr1/6SEM w/o r/g; Abd- soft, nontender, neg; Ext- VI, much improved w/ decr edema & erythema... IMP/PLAN>>  We reviewed her medical hx & discussed each concerning symptom individually to her satisfaction; we decided to continue the current meds the same & recheck pt in ~53mo..           Problem List:  GLAUCOMA (ICD-365.9) - hx mult eye problems w/ prev corneal transplant & glaucoma laser eye surg... [facility]  ASTHMATIC BRONCHITIS, ACUTE (ICD-466.0) - Hx AB requiring Rx w/ Depo/ Pred/ Mucinex/ Tussionex... ~  CXR 4/11 showed vague nodular densities on left... ~  8[facility]11:  [facility], [facility], & dx w/ pneumonia rx w/ ZPak... ~  CXR 3/12 showed borderline cardiomeg, nodular opac left base, NAD... ~  Spring 2013: she notes some allergy symptoms... ~  CXR 1/15 showed mild cardiomeg, atherosclerotic & tortuous Ao, low lung vol & sl incr markings, NAD..[name]  ~  CXR 9/15 showed borderline cardiomeg, underlying chr lung dis, left base atx vs effusion, ?early RUL opac... ~  CXR 10/15 showed clearing of patchy RUL opac & left effusion, lungs now clear & back to baseline, NAD ~  CXR 3/16 in hosp showed norm heart size, mild left base atx, otherw OK...   HYPERTENSION (ICD-401.9) - controlled on ASA 844md,  CARTIAXT 18043m, LASIX 58m73m1-2Qam... ~  7/10:  labs showed BUN=54, Creat=2.0 & rec to decr  diuretics in half= one Demedex & 1/2 Aldactone. ~  11/10:  labs showed BUN=38, Creat=1.8 & rec to keep same meds. ~  8/11:  labs showed BUN=42, Creat=2.1, K=4.3 ~  1/12:  labs in ER showed BUN=56, Creat=2.67, therefore diuretics decr to Demadex20/d only. ~  3/12:  2DEcho showed mod LVH, norm sys function w/ EF=60-65% & no regional wall motion abn; Gr1DD, mibile density on AoV- prob lambl'e escrescence, mildMR. ~  5/12:  f/u labs in office showed BUN=45, Creat=1.8, on Lasix 80mg79m for her edema... ~  [facility]12[facility]  [facility], Creat=1.47, edema resolved therefore decr Lasix to 58mg/60m  1/13:  BP= 160/76 but hasn't taken meds today; labs improved w/ BUN=26, Creat=1.3, BNP=50 ~  5/13:  BP= 140/78 & denies CP, palpit, SOB, edema; labs improved w/ BUN=28, Creat=1.2 ~  9/13:  BP= 140/60 & she denies CP, palpt, ch in SOB or edema... ~  12/13: controlled on Diltiazem180 & Furosemide 58mg-283mw/ BP=138/62 today & she denies CP, palpit, dizzy, ch inSOB, etc... ~  3/14:  BP= 130/66 on same meds; Labs stable as well w/ Cr=1.5, continue same... ~  7/14: on Diltiazem180 & Furosemide 58mg-2Q77m/ BP=150/64 today & she denies CP, palpit, dizzy, ch in SOB, etc. ~  11/14: BP= 126/64 on same meds, stable... ~  EKG 1/15 showed NSR, rate92, NSSTTWA, NAD... ~  3/15: on Diltiazem180 & Furosemide 58mg-2Qa38m BP=118/70 today & she denies CP, palpit, dizzy, ch in SOB, etc. ~  9/15: post hosp> off Diltiazem now & still on Furosemide 58mg- 1-258m& K10-2/d w/ BP=146/60 w/ leg swelling, edema, cellulitis as below... ~  1/16: on Diltiazem180, Furosemide 58mg-2Qam,29m10-2/d;  BP=132/64 today & she denies CP, palpit, dizzy, ch in SOB, etc. ~  3/16: on Diltiazem180, Furosemide 58mgQam, & 66mKCl since disch;  BP=134/62 today & she denies CP, palpit, dizzy, ch in SOB, etc ~  7/16: on Diltiazem180, Furosemide 58mgQam, & K62mid;  BP=120/70 & she remains asymptomatic...  PALPITATIONS, HX OF  (ICD-V12.50) ~  3/16:  EKG showed NSR, rate86, PACs, poor r prog V1-2, NAD...  VENOUS INSUFFICIENCY/ EDEMA >> swelling controlled w/ diuretics, low sodium diet, elevation, support hose, etc... RIGHT LEG SWELLING > LEFT LEG... Pt notes it's been like this since her last child was born... Hx RECURRENT CELLULITIS >>  ~  She saw DrEarly [facility]- there was nothing he could do... ~  Swelling diminished w/ low sodium, elevation, support hose, & Lasix40 1-2tabs daily... ~  Nov-Dec2013: she knows to elim sodium, elevate, wear support hose; she has been seen by [facility], DrEarly & there is nothing they can do; recently checked by Payton Mccallum- DrJones w/ patch dressing & support hose; edema decr w/ Lasix 46m/d; Lab today shows Creat=1.5..[name] ~  7/14:  she knows to elim sodium, elevate, wear support hose, & the Lasix80; weight is down 10# today to 141# w/ resolution of edema; [name] (Derm) treated dermatitis w/ new cream & improved. ~  11/14: wt is back up 8# to 149# today & she needs to elim sodium, elev legs, etc... ~  3/15: she knows to elim sodium, elevate, wear support hose, & the Lasix80; weight is back down 9# today to 137#..[name] ~  9//15: she [facility] cell[facility] in LLE (NOS) w/ 4+edema etc; VenDopplers neg for DVT; no open wounds/ drainage; treated w/ IV antibiotics and disch on Keflex; has persistent erythema, pain, swelling & we decided to continue the Keflex & refer to ID... ~  CT Abd&Pelvis 9/15> showed no acute abnormality & no venous obstruction or lymphocele to suggest a cause for left leg swelling; Cholelithiasis; unchanged bilateral adnexal cystic lesions (probably cysts); unchanged renal cysts and left renal atrophy; unchanged left perianal lipoma... ~  10/15: she is improved after the extended course of Keflex w/ decr erythema, swelling & discomfort; she did not see ID; plan is to continue same meds, cleansing, elevation, no salt, support hose... ~  3/16: she was Adm w/ right leg cellulitis- states she was  out shopping & "over-did it", felt weak & tired then developed right leg discomfort, increased swelling/ redness/ etc; WBC was 21K, VenDopplers were neg for DVT, treated w/ Zosyn/ Vanco & changed to oral Doxy at disch;  She notes that her leg is improved- less swollen, less red, less painful but still sl tender; ~  07/2015: back to baseline... ~  02/2016: Recurrent redness/ swelling 01/2016- no better on Keflex from DrWert + topical wraps and creams from DDunning Derm; we decided to treat w/ Pred10, incr Lasix80, no salt, Atarax for itching... ~  03/2016:  Much improved on the Pred10 + Lasix80 Qam; renal function is stable, the edema is diminished, and sed rate is now down to 15!  OK to wean Pred to 589mQam...  HYPERCHOLESTEROLEMIA (ICD-272.0) - prev Rx'd by DrGegick... Now on ZOCOR 2026m ([facility]restor5 & [facility]  [facility] due to $$$) w/ TChol 285, TG 204, HDL 69, LDL 195... "he was mad at me" ~  [facility] T[facility]ol 202, TG 60, HDL 112, LDL 99 ~  She is reminded (again) to come FASTING for f/u FLP... ~  [place]PD[place]13 on Simva20 showed TChol 191, TG 81, HDL 82, LDL 93 ~  She is reminded to ret FASTING for f/u blood work...  HYPOTHYROIDISM (ICD-244.9) - prev followed by DrGegick on SYNTHROID 125m57m1/2 daily... ~  pt brought labs  from [facility] 5/10 w/ TSH= 2.90, FreeT4= 1.09 ~  labs 8/11 showed TSH= 1.87 ~  Labs 3/12 showed TSH= 2.81 ~  Labs 1/13 showed TSH= 1.65 ~  Labs 3/14 showed TSH= 4.28 ~  Labs 3/16 showed TSH= 1.21  GERD (ICD-530.81) - uses PRILOSEC 40m/d... last EGD was 6/06 by DrPatterson showing 3cmHH, reflux...   DIVERTICULOSIS OF COLON (ICD-562.10) & COLONIC POLYPS (ICD-211.3) - last colonoscopy 6/06 was WNL- no abnormality seen...  GALLSTONE and CHOLECYSTITIS >>  ~  1/15: [facility]/11[facility]13 w/ RUQ pain & dx w/ gallstones, cholecystitis, & had ERCP w/ sphincterotomy (DrStark) & stone extraction; treated w/ Cipro,  LFTs improved, course complicated by LE cellulitis requiring addition of Vanco IV; she had f/u DrCornett, [facility] 2/15> brief note, did not rec elective surg given her age. ~  CT Abd&Pelvis 9/15> showed no acute abnormality & no venous obstruction or lymphocele to suggest a cause for left leg swelling; Cholelithiasis; unchanged bilateral adnexal cystic lesions (probably cysts); unchanged renal cysts and left renal atrophy; unchanged left perianal lipoma  RENAL INSUFFICIENCY (ICD-588.9) - tough balance betw renal perfusion & diuresis for edema... ~  labs 10/08 showed BUN= 27, Creat= 1.5 ~  labs 8/09 showed BUN= 44, Creat= 1.9 ~  labs 2/10 showed BUN 46, Creat= 1.7 ~  labs 7/10 showed BUN= 54, Creat= 2.0 ~  labs 11/10 showed BUN= 38, Creat= 1.8 ~  labs 8/11 showed BUN= 42, Creat= 2.1 ~  labs in ER 1/12 showed BUN=56, Creat=2.67, rec decr diuretics to Demadex237md only. ~  Labs 3/12 showed improved renal function w/ BUN=31, Creat=1.4... ~  Labs 5/12 on Lasix80 showed BUN=45, Creat=1.8 ~  Labs 9/12 by DrDeveshwar showed BUN=33, Creat=1.47 ~  Labs 1/13 showed BUN=26, Creat=1.3, BNP=50... On Lasix 4033mam. ~  Labs 5/13 showed BUN=28, Creat=1.2 ~  Labs 12/13 on Lasix80 showed BUN=30, Creat=1.5 ~  Labs 3/14 on Lasix80 showed BUN=31, Creat=1.5 ~  Labs 6/14 by DrDeveshwar on Lasix80 showed BUN=30, Cr=1.3 ~  Labs here 11/14 on Lasix80 showed BUN=31, Cr= 1.3 ~  [facility], s/p ERCP, and Creat- 1.1 - 1.7 ~  Labs 9/15 showed Cr= 0.94 to 1.4 range... ~  Labs 10/15 showed Cr= 1.3-1.4 ~  Labs 3/16 showed Cr= 1.52=>1.12 ~  Labs 7/16 showed BUN= 35, Cr= 1.13  DEGENERATIVE JOINT DISEASE (ICD-715.90) - this is her chief complaint- s/p right TKR in 1999,  left TKR 3/10... on MOBIC 7.5mg80mn & VICODIN Prn as well... followed by DrYates; and had right second toe amp by DrBeArnette Norris0 for hammertoe + spurs... [facility] she  says)... ~  5/13:  She saw DrDeveshwar for Rheum w/ shot in hip & sl improved... 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 rec- start Vit D 50,000 u weekly... ~  labs 2/10 showed Vit D level = 30... rec- continue Vit D 50K weekly... ~  labs 7/10 showed Vit D level = 39... rec- change to 1000 u OTC daily. ~  labs 8/11 showed Vit D level = 38... continue same. ~  Labs 3/14 showed Vit D level = 43... Continue 1000u daily.  ANXIETY (ICD-300.00) - on ALPRAZOLAM 0.25mg61mPrn... several deaths in the family... daughter who lives w/ her recently ill...Judith KitchenMarland Anderson  SHINGLES - she had right T9-10 shingles in 2011 & resolved w/ Valtrex, Pred, etc...   Past Surgical History:  Procedure Laterality Date  . ABDOMINAL HYSTERECTOMY    . BREAST BIOPSY     Benign  . CATARACT EXTRACTION    . ERCP N/A 10/06/2013   Procedure: ENDOSCOPIC RETROGRADE CHOLANGIOPANCREATOGRAPHY (ERCP);  Surgeon: Ladene Artist, MD;  Location: [place];  Service: Endoscopy;  Laterality: N/A;  . TOE AMPUTATION  08/2009   Right second toe - Dr Beola Cord  . TOTAL KNEE ARTHROPLASTY  1999   right  . TOTAL KNEE ARTHROPLASTY  11/2008   left - Dr Lorin Mercy    Outpatient Encounter Prescriptions as of 07/15/2016  Medication Sig  . acetaminophen (TYLENOL) 325 MG tablet Take 325 mg by mouth every 4 (four) hours as needed for mild pain or moderate pain.   [name] allopurinol (ZYLOPRIM) 300 MG tablet Take 1 tablet by mouth daily.  [name] ALPRAZolam (XANAX) 0.25 MG tablet Take 1/2 to 1 tablet by mouth 3 times daily as needed for anxiety  . Ascorbic Acid (VITAMIN C) 500 MG tablet Take 500 mg by mouth daily.    [name] aspirin 81 MG tablet Take 81 mg by mouth daily.    . bimatoprost (LUMIGAN) 0.01 % SOLN Place 1 drop into both eyes at bedtime.  . Cholecalciferol (VITAMIN D) 1000 UNITS capsule Take 1,000 Units by mouth daily.    [name] diltiazem (CARDIZEM CD) 180  MG 24 hr capsule TAKE ONE CAPSULE BY MOUTH EVERY DAY  . ferrous sulfate 325 (65 FE) MG tablet Take 1 tablet (325 mg total) by mouth 2 (two) times daily with a meal.  . folic acid (FOLVITE) 1 MG tablet Take 1 tablet (1 mg total) by mouth daily.  . furosemide (LASIX) 40 MG tablet Take 2 tablets every AM  . hydrOXYzine (ATARAX/VISTARIL) 25 MG tablet Take 1 tablet (25 mg total) by mouth every 6 (six) hours as needed.  [name] levothyroxine (SYNTHROID, LEVOTHROID) 125 MCG tablet TAKE 1/2 TABLET BY MOUTH DAILY BEFORE BREAKFAST  . loperamide (IMODIUM) 2 MG capsule Take 1 capsule (2 mg total) by mouth as needed for diarrhea or loose stools.  . meclizine (ANTIVERT) 25 MG tablet Take 1/2-1 tablet by mouth every 4 hours as needed for dizziness  . Multiple Vitamin (MULTIVITAMIN WITH MINERALS) TABS tablet Take 1 tablet by mouth daily.  . mupirocin ointment (BACTROBAN) 2 % 1 APPLICATION APPLY ON THE SKIN AS DIRECTED APPLY TO AFFECTED AREA ON HAND AND COVER AS DIRECTED  . Omega-3 Fatty Acids (FISH OIL) 1000 MG CAPS Take 1 capsule by mouth daily.  [name] omeprazole (PRILOSEC) 20 MG capsule Take 20 mg by mouth daily.    . ondansetron (ZOFRAN) 8 MG tablet Take 1 tablet (8 mg total) by mouth every 6 (six) hours as needed for nausea or vomiting.  Vladimir Faster Glycol-Propyl Glycol (SYSTANE) 0.4-0.3 % SOLN Place 1 drop into both eyes 4 (four) times daily.   . potassium chloride (KLOR-CON M10) 10 MEQ tablet TAKE 2 TABLETS (20 MEQ TOTAL) BY MOUTH DAILY.  [name] predniSONE (DELTASONE) 10 MG tablet Take 1 tablet (10 mg total) by mouth daily with breakfast.  . simvastatin (ZOCOR) 20 MG tablet TAKE 1 TABLET (20 MG TOTAL) BY MOUTH DAILY.  [name] thiamine 100 MG tablet Take 1 tablet (100 mg total) by mouth daily.  . traMADol (ULTRAM) 50 MG tablet Take 1 tablet (50 mg total) by mouth every 8 (eight) hours as needed.  . [DISCONTINUED] meclizine (  ANTIVERT) 25 MG tablet Take 1/2-1 tablet by mouth every 4 hours as needed for dizziness  . [DISCONTINUED]  potassium chloride (KLOR-CON M10) 10 MEQ tablet TAKE 2 TABLETS (20 MEQ TOTAL) BY MOUTH DAILY.  . [DISCONTINUED] traMADol (ULTRAM) 50 MG tablet Take 1 tablet (50 mg total) by mouth every 8 (eight) hours as needed.  . [DISCONTINUED] levothyroxine (SYNTHROID, LEVOTHROID) 125 MCG tablet TAKE 1/2 TABLET BY MOUTH DAILY BEFORE BREAKFAST (Patient not taking: Reported on 07/15/2016)   No facility-administered encounter medications on file as of 07/15/2016.     Allergies  Allergen Reactions  . Enablex [Darifenacin Hydrobromide Er] Other (See Comments)    Caused her throat and mouth to have bumps and feel like it was swelling  . Clarithromycin Swelling    REACTION: causes her mouth to swell  . Latanoprost Itching    Eye reddness and swelling and itching  . Codeine Nausea Only  . Morphine Nausea And Vomiting  . Sulfonamide Derivatives Other (See Comments)    REACTION: unsure of reaction    Current Medications, Allergies, Past Medical History, Past Surgical History, Family History, and Social History were reviewed in [employer] record.    Review of Systems        See HPI - all other systems neg except as noted...  The patient complains of dyspnea on exertion, peripheral edema, muscle weakness, and difficulty walking.  The patient denies anorexia, fever, weight loss, weight gain, vision loss, decreased hearing, hoarseness, chest pain, syncope, prolonged cough, headaches, hemoptysis, abdominal pain, melena, hematochezia, severe indigestion/heartburn, hematuria, incontinence, genital sores, suspicious skin lesions, transient blindness, depression, unusual weight change, abnormal bleeding, enlarged lymph nodes, and angioedema.     Objective:   Physical Exam      WD, WN, 82 y/o WF in NAD... GENERAL:  Alert & oriented; pleasant & cooperative... HEENT:  [place]/AT, EOM- full, EACs-clear, TMs-wnl, NOSE-clear, THROAT-clear & wnl. NECK:  Supple w/ fairROM; no JVD; normal carotid  impulses w/o bruits; no thyromegaly or nodules palpated; no lymphadenopathy. CHEST:  Clear, decr BS bilat w/o wheezing, rales, or rhonchi heard... HEART:  Regular Rhythm;  gr 1/6 SEM without rubs or gallops detected... ABDOMEN:  Soft & nontender; normal bowel sounds; no organomegaly or masses detected. EXT:  moderate arthritic changes; s/p bilat TKRs; mild varicose veins/ +venous insuffic/ tr edema Left leg, min red & tender... s/p right second toe distal amputation... NEURO:  CN's intact;  no focal neuro deficits... DERM:  No lesions noted; no rash etc...  RADIOLOGY DATA:  Reviewed in the EPIC EMR & discussed w/ the patient...  LABORATORY DATA:  Reviewed in the EPIC EMR & discussed w/ the patient...   Assessment & Plan:    Hx Left LEG CELLULITIS, then Right LEG CELLULITIS>> ?Etiology (NOS), no broken skin or draining areas; treated empirically in [facility] 1/15 & disch on Keflex w/ persistent red/ swollen/ tender left leg; we continued the Keflex for 2wks longer & wanted ID consult; she never got the consult but improved on the Keflex; now off the antibiotic w/ decr swelling, erythema, discomfort; REC to continue meds, elevation, no salt, support hose, etc...  12/15> she reports left leg red, incr swelling, & drainage- went to Derm, DrJordan & given cream which she reports has helped... 3/16> she was hosp w/ right leg cellulitis- swollen, red, inflammed, tender- but again NOS, no broken skin, etc; treated w/ Zosyn/ Vanco & disch on Doxy=> improved... 7/16> stable w/o recurrent soft tissue infection 11//16> at baseline... 6/17>  Recurrent redness/ swelling 01/2016- no better on Keflex from DrWert + topical wraps and creams from DrJordan, Derm; we decided to treat w/ Pred10, incr Lasix80, no salt, Atarax for itching 7/17> Rec to contuinue low sodium diet & the Lasix40- 2tabs Qam;  OK to wean the Pred 33m tabs to 1/2 tab Qam & stay on this!  ROV in 1 month... 8/17> We discussed the poss need for  further eval of the dizziness but for now she would like to continue the Meclizine prn & try the epley maneuvers (reviewed w/ pt & hand-out given); sheknows to be very careful w/ changing positions and ambulation;  She will continue the Pred 58mQam & her Lasix4-=2Qam, avoid sodium, rov 71m65mo0/9/17>   We reviewed her medical hx & discussed each concerning symptom individually to her satisfaction; we decided to continue the current meds the same & recheck pt in ~27mo871mo  URI/ Bronchitis> she presented 1/16 w/ sore throat, cough, yellow sput & we decided to treat w/ Levaquin x5d, Depo80, Pred dosepak; she may also use Mucinex, fluids, & MMW if needed...  HBP>  Controlled on meds, continue same including the Lasix40Qam...  Cardiac>  Prev DrRoss' note reviewed; see 2DEcho, Event Monitor reports no dangerous arrhythmias, continue meds...  Ven Insuffic/ EDEMA- improved>  evals by Cards & VVS> "there is nothing they can do"; continue elevation, no salt, Lasix80 now, compression...  CHOL>  On Simva20 & FLP 5/13 looked good...  HYPOTHYROID>  Continue Synthroid 125mc83mtaking 1/2 tab daily...  GI>  Stable now s/p ERCP for gallstones; followed by DrStark now... Episode of cholecystitis, gallstones 1/15- s/p ERCP & improved w/o surg (DrStark, DrCornett)...  Renal Insuffic>  Careful w/ diuresis, NSAIDs etc; Creat varied 1.1 to 1.7  DJD>  She saw DrDeveshwar for her Rheum complaints==> shot in knee; DrYates/ NewtoErnestina Patches signed off; may need pain clinic....  Other problems as noted => Dizziness, no meclizine, try epley maneuvers, consider further eval if needed.   Patient's Medications  New Prescriptions   No medications on file  Previous Medications   ACETAMINOPHEN (TYLENOL) 325 MG TABLET    Take 325 mg by mouth every 4 (four) hours as needed for mild pain or moderate pain.    ALLOPURINOL (ZYLOPRIM) 300 MG TABLET    Take 1 tablet by mouth daily.   ALPRAZOLAM (XANAX) 0.25 MG TABLET    Take 1/2 to  1 tablet by mouth 3 times daily as needed for anxiety   ASCORBIC ACID (VITAMIN C) 500 MG TABLET    Take 500 mg by mouth daily.     ASPIRIN 81 MG TABLET    Take 81 mg by mouth daily.     BIMATOPROST (LUMIGAN) 0.01 % SOLN    Place 1 drop into both eyes at bedtime.   CHOLECALCIFEROL (VITAMIN D) 1000 UNITS CAPSULE    Take 1,000 Units by mouth daily.     DILTIAZEM (CARDIZEM CD) 180 MG 24 HR CAPSULE    TAKE ONE CAPSULE BY MOUTH EVERY DAY   FERROUS SULFATE 325 (65 FE) MG TABLET    Take 1 tablet (325 mg total) by mouth 2 (two) times daily with a meal.   FOLIC ACID (FOLVITE) 1 MG TABLET    Take 1 tablet (1 mg total) by mouth daily.   FUROSEMIDE (LASIX) 40 MG TABLET    Take 2 tablets every AM   HYDROXYZINE (ATARAX/VISTARIL) 25 MG TABLET    Take 1 tablet (25 mg total) by mouth every  6 (six) hours as needed.   LEVOTHYROXINE (SYNTHROID, LEVOTHROID) 125 MCG TABLET    TAKE 1/2 TABLET BY MOUTH DAILY BEFORE BREAKFAST   LOPERAMIDE (IMODIUM) 2 MG CAPSULE    Take 1 capsule (2 mg total) by mouth as needed for diarrhea or loose stools.   MULTIPLE VITAMIN (MULTIVITAMIN WITH MINERALS) TABS TABLET    Take 1 tablet by mouth daily.   MUPIROCIN OINTMENT (BACTROBAN) 2 %    1 APPLICATION APPLY ON THE SKIN AS DIRECTED APPLY TO AFFECTED AREA ON HAND AND COVER AS DIRECTED   OMEGA-3 FATTY ACIDS (FISH OIL) 1000 MG CAPS    Take 1 capsule by mouth daily.   OMEPRAZOLE (PRILOSEC) 20 MG CAPSULE    Take 20 mg by mouth daily.     ONDANSETRON (ZOFRAN) 8 MG TABLET    Take 1 tablet (8 mg total) by mouth every 6 (six) hours as needed for nausea or vomiting.   POLYETHYL GLYCOL-PROPYL GLYCOL (SYSTANE) 0.4-0.3 % SOLN    Place 1 drop into both eyes 4 (four) times daily.    PREDNISONE (DELTASONE) 10 MG TABLET    Take 1 tablet (10 mg total) by mouth daily with breakfast.   SIMVASTATIN (ZOCOR) 20 MG TABLET    TAKE 1 TABLET (20 MG TOTAL) BY MOUTH DAILY.   THIAMINE 100 MG TABLET    Take 1 tablet (100 mg total) by mouth daily.  Modified Medications    Modified Medication Previous Medication   MECLIZINE (ANTIVERT) 25 MG TABLET meclizine (ANTIVERT) 25 MG tablet      Take 1/2-1 tablet by mouth every 4 hours as needed for dizziness    Take 1/2-1 tablet by mouth every 4 hours as needed for dizziness   POTASSIUM CHLORIDE (KLOR-CON M10) 10 MEQ TABLET potassium chloride (KLOR-CON M10) 10 MEQ tablet      TAKE 2 TABLETS (20 MEQ TOTAL) BY MOUTH DAILY.    TAKE 2 TABLETS (20 MEQ TOTAL) BY MOUTH DAILY.   TRAMADOL (ULTRAM) 50 MG TABLET traMADol (ULTRAM) 50 MG tablet      Take 1 tablet (50 mg total) by mouth every 8 (eight) hours as needed.    Take 1 tablet (50 mg total) by mouth every 8 (eight) hours as needed.  Discontinued Medications   LEVOTHYROXINE (SYNTHROID, LEVOTHROID) 125 MCG TABLET    TAKE 1/2 TABLET BY MOUTH DAILY BEFORE BREAKFAST

## 2016-07-31 DIAGNOSIS — H5231 Anisometropia: Secondary | ICD-10-CM | POA: Diagnosis not present

## 2016-08-12 DIAGNOSIS — H16001 Unspecified corneal ulcer, right eye: Secondary | ICD-10-CM | POA: Diagnosis not present

## 2016-08-19 DIAGNOSIS — H18421 Band keratopathy, right eye: Secondary | ICD-10-CM | POA: Diagnosis not present

## 2016-08-22 ENCOUNTER — Telehealth: Payer: Self-pay | Admitting: Pulmonary Disease

## 2016-08-22 DIAGNOSIS — W19XXXA Unspecified fall, initial encounter: Secondary | ICD-10-CM

## 2016-08-22 DIAGNOSIS — R42 Dizziness and giddiness: Secondary | ICD-10-CM

## 2016-08-22 NOTE — Telephone Encounter (Signed)
Per SN---  Ok to refer to neurology  For fall assessment and dizziness.  Order has been placed. Nothing further is needed.

## 2016-08-22 NOTE — Telephone Encounter (Signed)
Called and spoke with pts daughter and she stated that the pt is still having dizziness.  She stated that SN spoke with the pt before about getting her set up to see Neurology.  Daughter stated that she wants to set this appt up now please.  SN please advise. thanks

## 2016-08-27 DIAGNOSIS — H401134 Primary open-angle glaucoma, bilateral, indeterminate stage: Secondary | ICD-10-CM | POA: Diagnosis not present

## 2016-08-29 ENCOUNTER — Other Ambulatory Visit: Payer: Self-pay | Admitting: Pulmonary Disease

## 2016-09-02 DIAGNOSIS — H18421 Band keratopathy, right eye: Secondary | ICD-10-CM | POA: Diagnosis not present

## 2016-09-03 ENCOUNTER — Telehealth: Payer: Self-pay | Admitting: Pulmonary Disease

## 2016-09-03 MED ORDER — AZITHROMYCIN 250 MG PO TABS: ORAL_TABLET | ORAL | 0 refills | Status: DC

## 2016-09-03 MED ORDER — MECLIZINE HCL 25 MG PO TABS: ORAL_TABLET | ORAL | 2 refills | Status: DC

## 2016-09-03 NOTE — Telephone Encounter (Signed)
Called and spoke with pt and she is aware of meds that have been sent to the pharmacy.  Nothing further is needed.  

## 2016-09-03 NOTE — Telephone Encounter (Signed)
Called and spoke to pt's daughter. Pt c/o sinus congestion, sore throat, prod cough with creamy white in color, fatigue x 2 days. Denies SOB, f/c/s and CP/tightness. Pt taking tylenol prn and prednisone 10mg . Pt also states she is still c/o dizziness and is unable to see Neuro until Feb 2018, pt is requesting recs to help with dizziness in the meantime.   Dr. Lenna Gilford please advise. Thanks.   Allergies  Allergen Reactions  . Enablex [Darifenacin Hydrobromide Er] Other (See Comments)    Caused her throat and mouth to have bumps and feel like it was swelling  . Clarithromycin Swelling    REACTION: causes her mouth to swell  . Latanoprost Itching    Eye reddness and swelling and itching  . Codeine Nausea Only  . Morphine Nausea And Vomiting  . Sulfonamide Derivatives Other (See Comments)    REACTION: unsure of reaction    Current Outpatient Prescriptions on File Prior to Visit  Medication Sig Dispense Refill  . acetaminophen (TYLENOL) 325 MG tablet Take 325 mg by mouth every 4 (four) hours as needed for mild pain or moderate pain.     [name] allopurinol (ZYLOPRIM) 300 MG tablet Take 1 tablet by mouth daily.    [name] ALPRAZolam (XANAX) 0.25 MG tablet Take 1/2 to 1 tablet by mouth 3 times daily as needed for anxiety 90 tablet 5  . Ascorbic Acid (VITAMIN C) 500 MG tablet Take 500 mg by mouth daily.      [name] aspirin 81 MG tablet Take 81 mg by mouth daily.      . bimatoprost (LUMIGAN) 0.01 % SOLN Place 1 drop into both eyes at bedtime.    . Cholecalciferol (VITAMIN D) 1000 UNITS capsule Take 1,000 Units by mouth daily.      [name] diltiazem (CARDIZEM CD) 180 MG 24 hr capsule TAKE ONE CAPSULE BY MOUTH EVERY DAY 90 capsule 3  . ferrous sulfate 325 (65 FE) MG tablet Take 1 tablet (325 mg total) by mouth 2 (two) times daily with a meal. 60 tablet 2  . folic acid (FOLVITE) 1 MG tablet Take 1 tablet (1 mg total) by mouth daily. 30 tablet 1  . furosemide (LASIX) 40 MG tablet Take 2 tablets every AM 60 tablet 3  .  hydrOXYzine (ATARAX/VISTARIL) 25 MG tablet Take 1 tablet (25 mg total) by mouth every 6 (six) hours as needed. 50 tablet 0  . levothyroxine (SYNTHROID, LEVOTHROID) 125 MCG tablet TAKE 1/2 TABLET BY MOUTH DAILY BEFORE BREAKFAST 45 tablet 3  . loperamide (IMODIUM) 2 MG capsule Take 1 capsule (2 mg total) by mouth as needed for diarrhea or loose stools. 10 capsule 0  . meclizine (ANTIVERT) 25 MG tablet Take 1/2-1 tablet by mouth every 4 hours as needed for dizziness 50 tablet 0  . Multiple Vitamin (MULTIVITAMIN WITH MINERALS) TABS tablet Take 1 tablet by mouth daily. 30 tablet 0  . mupirocin ointment (BACTROBAN) 2 % 1 APPLICATION APPLY ON THE SKIN AS DIRECTED APPLY TO AFFECTED AREA ON HAND AND COVER AS DIRECTED  0  . Omega-3 Fatty Acids (FISH OIL) 1000 MG CAPS Take 1 capsule by mouth daily.    [name] omeprazole (PRILOSEC) 20 MG capsule Take 20 mg by mouth daily.      . ondansetron (ZOFRAN) 8 MG tablet Take 1 tablet (8 mg total) by mouth every 6 (six) hours as needed for nausea or vomiting. 20 tablet 0  . Polyethyl Glycol-Propyl Glycol (SYSTANE) 0.4-0.3 % SOLN Place 1 drop into both  eyes 4 (four) times daily.     . potassium chloride (KLOR-CON M10) 10 MEQ tablet TAKE 2 TABLETS (20 MEQ TOTAL) BY MOUTH DAILY. 180 tablet 3  . predniSONE (DELTASONE) 10 MG tablet TAKE 1 TABLET (10 MG TOTAL) BY MOUTH DAILY WITH BREAKFAST. 50 tablet 2  . simvastatin (ZOCOR) 20 MG tablet TAKE 1 TABLET (20 MG TOTAL) BY MOUTH DAILY. 90 tablet 3  . thiamine 100 MG tablet Take 1 tablet (100 mg total) by mouth daily. 30 tablet 1  . traMADol (ULTRAM) 50 MG tablet Take 1 tablet (50 mg total) by mouth every 8 (eight) hours as needed. 90 tablet 5   No current facility-administered medications on file prior to visit.

## 2016-09-05 DIAGNOSIS — H18421 Band keratopathy, right eye: Secondary | ICD-10-CM | POA: Diagnosis not present

## 2016-09-09 DIAGNOSIS — H18421 Band keratopathy, right eye: Secondary | ICD-10-CM | POA: Diagnosis not present

## 2016-09-13 DIAGNOSIS — B0052 Herpesviral keratitis: Secondary | ICD-10-CM | POA: Diagnosis not present

## 2016-09-29 ENCOUNTER — Other Ambulatory Visit: Payer: Self-pay | Admitting: Pulmonary Disease

## 2016-10-04 DIAGNOSIS — H18421 Band keratopathy, right eye: Secondary | ICD-10-CM | POA: Diagnosis not present

## 2016-10-08 DIAGNOSIS — R42 Dizziness and giddiness: Secondary | ICD-10-CM | POA: Diagnosis not present

## 2016-10-08 DIAGNOSIS — H6123 Impacted cerumen, bilateral: Secondary | ICD-10-CM | POA: Diagnosis not present

## 2016-10-15 ENCOUNTER — Ambulatory Visit (INDEPENDENT_AMBULATORY_CARE_PROVIDER_SITE_OTHER): Payer: Medicare Other | Admitting: Pulmonary Disease

## 2016-10-15 VITALS — BP 136/64 | HR 96 | Temp 97.7°F | Ht 63.0 in | Wt 135.2 lb

## 2016-10-15 DIAGNOSIS — D638 Anemia in other chronic diseases classified elsewhere: Secondary | ICD-10-CM

## 2016-10-15 DIAGNOSIS — R609 Edema, unspecified: Secondary | ICD-10-CM | POA: Diagnosis not present

## 2016-10-15 DIAGNOSIS — Z872 Personal history of diseases of the skin and subcutaneous tissue: Secondary | ICD-10-CM

## 2016-10-15 DIAGNOSIS — I359 Nonrheumatic aortic valve disorder, unspecified: Secondary | ICD-10-CM | POA: Diagnosis not present

## 2016-10-15 DIAGNOSIS — M159 Polyosteoarthritis, unspecified: Secondary | ICD-10-CM

## 2016-10-15 DIAGNOSIS — E039 Hypothyroidism, unspecified: Secondary | ICD-10-CM

## 2016-10-15 DIAGNOSIS — F411 Generalized anxiety disorder: Secondary | ICD-10-CM

## 2016-10-15 DIAGNOSIS — I10 Essential (primary) hypertension: Secondary | ICD-10-CM | POA: Diagnosis not present

## 2016-10-15 DIAGNOSIS — N183 Chronic kidney disease, stage 3 unspecified: Secondary | ICD-10-CM

## 2016-10-15 DIAGNOSIS — M150 Primary generalized (osteo)arthritis: Secondary | ICD-10-CM

## 2016-10-15 DIAGNOSIS — I872 Venous insufficiency (chronic) (peripheral): Secondary | ICD-10-CM

## 2016-10-15 NOTE — Patient Instructions (Signed)
Today we updated your med list in our EPIC system...    Continue your current medications the same...  Please be save getting about- NO FALLING ALLOWED!  We will wait for the recommendations from our Neurology consult coming up soon...  Call for any questions...  Let's plan a follow up visit in 66mo, sooner if needed for problems.Marland KitchenMarland Kitchen

## 2016-10-19 NOTE — Progress Notes (Signed)
Subjective:    Patient ID: Barbaraann Share, female    DOB: 10-16-1914, 83 y.o.   MRN: [id number]  HPI 83 y/o WF here for a follow up visit... she has mult med problems including:  Hx asthmatic bronchitis;  HBP;  Ven Insuffic & edema;  Hyperchol;  Hypothyroid;  GERD/ Divertics/ Colon polyps;  Renal insuffic;  DJD;  Vit D defic;  Anxiety...  ~  SEE PREV EPIC NOTES FOR OLDER DATA >>    LABS 11/14:  Chems- ok w/ BS=108, Cr=1.3;  Uric=4.3 on Allopurinol100;  CBC- ok w/ Hg=12.6.Marland KitchenMarland Kitchen  December 15, 2013:  63moROV & [facility] w/ RUQ pain & dx w/ gallstones, cholecystitis, & had ERCP w/ sphincterotomy (DrStark) & stone extraction; treated w/ Cipro, LFTs improved, course complicated by LE cellulitis requiring addition of Vanco IV; she had f/u DrCornett, [facility] 2/15> brief note, did not rec elective surg given her age...  June 14, 2014:  [facility] by [facility] w/ sudden fever to 103, chills, and left leg pain/ redness/ swelling=> c/w severe cellulitis; she was felt to be septic but no lesions on leg, no areas of broken skin, wearing support hose, her Lasix dose was cut from 836mam to 4059m, other meds the same...   CXR 9/15 showed borderline cardiomeg, underlying chr lung dis, left base atx vs effusion, ?early RUL opac...   EKG 9/15 showed NSR w/ PACs, rate92, NSSTTWA, NAD...  MRI of left leg 9/15> diffuse SQ soft tissue swelling & edema, no discrete abscess identified, no osteomyelitis, no signs of septic arthritis, etc (c/w severe cellulitis).  Ven Dopplers 9/15> no evid of DVT or superficial thrombosis in left leg, enlarged LN's noted in left groin  LABS 9/15 in Hosp> Hg=10-11 range, WBC=29K=>11.7 by disch, BUN/Cr= 39/1.3 improved to 27/0.9 by disch, MRSA=neg, UA=clear...  LABS 9/15 in office f/u>  Chems- ok x Na=131, Cr=1.4;  CBC- ok x   CT Abd&Pelvis 9/15> showed no acute abnormality & no venous obstruction or lymphocele to suggest a cause for left leg  swelling; Cholelithiasis; unchanged bilateral adnexal cystic lesions (probably cysts); unchanged renal cysts and left renal atrophy; unchanged left perianal lipoma...  CXR 10/15 showed clearing of patchy RUL opac & left effusion, lungs now clear & back to baseline, NAD...  LABS 101/15:  Chems- ok x Na=132, BUN=33, Cr=1.3;  CBC- ok w/ Hg=12.0, WBC=7.6;  Sed=54 (improved from 90)...  ~  December 06, 2014:  59mo63mo & [facility] hospital check[name] w/ right leg cellulitis- states she was out shopping & "over-did it", felt weak & tired then developed right leg discomfort, increased swelling/ redness/ etc; WBC was 21K, VenDopplers were neg for DVT, treated w/ Zosyn/ Vanco & changed to oral Doxy at disch;  She notes that her leg is improved- less swollen, less red, less painful but still sl tender; she notes that right leg first started giving her trouble after the birth of her last child; she had thorough vasc eval by DrEarly in the past and no surgery indicated; she knows to elim salt/sodium, elev legs, wear support hose; she has finished the Doxy100Bid given at disch & she misunderstood the DC directions and has been taking her prev Lasix80mg13m... We reviewed the following medical problems during today's office visit >> NOTE: as an afterthought she noted vag itching & wanted Rx- offered Diflucan orally & if not improved she will need Gyn eval (she  has Atarax as well on her med list)...     HBP> on Diltiazem180, Furosemide 19mQam, & off KCl since disch;  BP=134/62 today & she denies CP, palpit, dizzy, ch in SOB, etc...    VI, Edema, Cellulitis> she knows to elim sodium, elevate, wear support hose; still taking Lasix80Qam; Hosp 3/16 as above & disch on Doxy- now out & right leg improved she says; RLE chr larger than LLE, sl red/ tender; Rec decr Lasix40.     CHOL> on Simva20 Qhs + low fat diet; last FLP 5/13 showed TChol 191, TG 81, HDL 82, LDL 93... Needs to ret fasting for f/u  blood work.    Hypothy> on Synthroid 1224m- 1/2 tab daily; labs 3/16 showed TSH= 1.21    Renal Insuffic> Creat prev up to 1.8 on the Lasix 8014mCreat 3/16= 1.52=>1.12    Derm> she has Psoriasis on her arms & has been evaluated by DrJordan/Jones; they also treated the LE cellulitis; Rec Atarax prn itching...    Anemia> Hosp 1/15 w/ gallstones and cholecystits (no surg); Hg= 7.5-9.4 at that time & Hg improved to 12.7; labs 3/16 showed Hg= 13.2=>10.6, Fe=13 (7%sat), Ferritin=104, on FeSO4... We reviewed prob list, meds, xrays and labs> see below for updates >>   CXR 3/16 showed norm heart size, mild left base atx, otherw OK...   EKG 3/16 showed NSR, rate86, PACs, poor r prog V1-2, NAD... Marland KitchenMarland KitchenVen Dopplers 3/16 were neg for DVT...   2DEcho 3/16 showed mild LVH w/ mild focal basal septal hypertrophy, norm LVF w/ EF=60-65%, norm wall motion, Gr1DD, norm AoV, mild MR, mild LA dil, norm RV, PAsys=38...  LABS 3.16:  Chems- ok x BUN=37 due to Lasix80 she was on & we decr her to 50m74m CBC- wnl,  Sed=65;  PCT<0.10 indicating +inflamm but no infection now...  PLAN>> we decided to check f/u labs (BMet, CBC, Sed, PCT); Rec to apply cool compresses to right leg several times daily, elim salt/sodium, elev legs, wear support hose; recurrent cellulitis episodes may require suppressive antibiotic therapy... Labs ret w/ BUN=37 7 we decr her Lasix from 80=>40/d; elev Sed w/ neg PCT & we are holding any further antibiotics for now...  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 We reviewed the following medical problems during today's office visit >>     HBP> on Diltiazem180, Furosemide 50mgQ71m& KCl Bid;  BP=120/70  today & she denies CP, palpit, dizzy, ch in SOB, etc...    VI, Edema, Cellulitis> she knows to elim sodium, elevate, wear support hose; still taking Lasix40Qam; Hosp 3/16 as above & disch on Doxy- right leg improved she says; RLE chr larger than LLE, sl red/ tender; Rec continue Lasix40.     CHOL> on Simva20 Qhs + low fat diet; last FLP 5/13 showed TChol 191, TG 81, HDL 82, LDL 93... Needs to ret fasting for f/u blood work.    Hypothy> on Synthroid 125mcg-59m tab daily; labs 3/16 showed TSH= 1.21    Renal Insuffic> Creat prev up to 1.8 on Lasix 80mg; C61m 3/16= 1.52=>1.12; Labs 7/16 stable w/ BUN=35, Cr=1.13    Hx severe DJD, Gout w/ tophi, s/p bilat  TKRs> prev followed by Ladene Artist; on Allopurinol, Tramadol, Tylenol...    Derm> she has Psoriasis on her arms & has been evaluated by DrJordan/Jones; they also treated the LE cellulitis; Rec Atarax prn itching...    Anemia> Hosp 1/15 w/ gallstones and cholecystits (no surg); Hg= 7.5-9.4 at that time & Hg improved to 12.7; labs 7/16 showed Hg= 12.1, Fe=102 (32%sat), Ferritin=116, on FeSO4/d... We reviewed prob list, meds, xrays and labs> see below for updates >>   LABS 7/16:  Chems- ok w/ BUN=35, Cr=1.13, BS=100, BNP=268;  CBC- ok w/ Hg=12.1, Fe=102 (32%), Ferritin=116...  ~  August 07, 2015:  42moROV & post-ER follow up visit> [place] c/o HA rated 8/10> frontal HA, assoc n/v, felt weak & unsteady; BP was ok, VS were stable, Labs ok, CT Head was neg- wnl; Tylenol helped... we reviewed the following medical problems during today's office visit >>     HBP> on Diltiazem180, Furosemide 436mam, & KCl Bid;  BP=124/60 today & she denies CP, palpit, dizzy, ch in SOB, etc...    VI, Edema, Hx cellulitis> she knows to elim sodium, elevate, wear support hose; on Lasix40Qam; [facility] D[facility]- right leg improved; RLE chr larger than LLE, sl red/ tender; Rec continue Lasix40.     CHOL> on Simva20 Qhs + low fat diet; last FLP 5/13  showed TChol 191, TG 81, HDL 82, LDL 93... Needs to ret fasting for f/u blood work.    Hypothy> on Synthroid 12511m 1/2 tab daily; labs 3/16 showed TSH= 1.21    Renal Insuffic> Creat prev up to 1.8 on Lasix 74m32mreat 3/16= 1.52=>1.12; Labs 7/16 stable w/ BUN=35, Cr=1.13    Hx severe DJD, Gout w/ tophi, s/p bilat TKRs> prev followed by DrDeveshwar; on Allopurinol, Tramadol, Tylenol...    Derm> she has Psoriasis on her arms & has been evaluated by DrJordan/Jones; they also treated the LE cellulitis; Rec Atarax prn itching...    Anemia> Hosp 1/15 w/ gallstones and cholecystits (no surg); Hg= 7.5-9.4 at that time & Hg improved to 12.7; labs 7/16 showed Hg= 12.1, Fe=102 (32%sat), Ferritin=116, on FeSO4/d...    Anxiety>  On Xanax 0.25- 1/2 to 1 tab tid prn... EXAM shows Afeb, VSS, O2sat=96% on RA:  Heent- neg, mallampati1;  Chest- clear w/o w/r/r;  Heart- RR gr1/6SEM w/o r/g;  Abd- soft, nontender, neg;  Ext- VI, 1+edema, ecchymoses;  Neuro- no focal neuro deficits...   CT Head 07/20/15>  Intact, WNL, NAD...  Labs 06/2015>  Chems- wnl;  CBC- wnl... IMP/PLAN>>  OK Flu vaccine today;  Continue same meds....  ~  February 28, 2016:  60mo 33mo& Donise was [facility] by [facility] w/ ?cellulitis right leg, she had been seeing Derm & Rx w/ creams but no better; she was Afeb, WBC=8.4K, Sed=44; he prescribed Keflex but no better she says & pt has followed up w/ Derm=> legs wrapped Qwk now, swelling worse, wt up 7# to 142# today => this time Rx w/ Pred10, Atarax, incr Lasix80 & no salt...    HBP> on Diltiazem180, Furosemide 40mgQ98m& KCl 10Bid;  BP=120/60 today & she denies CP, palpit, dizzy, ch in SOB, etc...    VI, Edema, Hx cellulitis> she knows to elim sodium, elevate, wear support hose; on Lasix40Qam; Hosp 3/16 & disch on Doxy; Hx cellulitis & non-infection inflammation treated w/ diff antibiotics and Pred, Lasix, no salt, wraps & creams from Derm DScherrie Merrittsisits, and us...KoreaMarland KitchenMarland Kitchen  CHOL> on Simva20 Qhs + low fat  diet; last FLP 5/13 showed TChol 191, TG 81, HDL 82, LDL 93... Needs to ret fasting for f/u blood work.    Hypothy> on Synthroid 123mg- 1/2 tab daily; labs 3/16 showed TSH= 1.21    Renal Insuffic> Creat prev up to 1.8 on Lasix 813m Creat 3/16= 1.52=>1.12; Labs 2016 stable w/ BUN=23-37, Cr=1.13-1.28; Labs 5/17 showed BUN=25, Cr=1.33    Hx severe DJD, Gout w/ tophi, s/p bilat TKRs> prev followed by DrDeveshwar; on Allopurinol, Tramadol, Tylenol...    Derm> she has Psoriasis on her arms & has been evaluated by DrJordan/Jones; they also treated the LE cellulitis/ inflamm w/ wraps and creams, Atarax prn itching...    Anemia> Hosp 1/15 w/ gallstones and cholecystits (no surg); Hg= 7.5-9.4 at that time & Hg improved to 12.7; labs 7/16 showed Hg= 12.1, Fe=102 (32%sat), Ferritin=116, on FeSO4/d; labs 5/16 showed Hg=11.8    Anxiety>  On Xanax 0.25- 1/2 to 1 tab tid prn... EXAM shows Afeb, VSS, O2sat=98% on RA:  Heent- neg, mallampati1;  Chest- decr BS w/o w/r/r;  Heart- RR gr1/6SEM w/o r/g;  Abd- soft, nontender, neg;  Ext- VI, +edema to above knees, leg wrapped;  Neuro- intact... IMP/PLAN>>  Not better w/ Keflex, looks more inflammed than infected=> Rx w/ Pred10, Atarax25 prn, incr Lasix40-2Qam & no salt, ROV in 2wks...   ~  April 03, 2016:  [place] c/o chest congestion, wheezing, cough, sl yellow sput, ?low grade fever; had already been to [facility]; CXR showed diffuse peribronchial cuffing, no airsp dis; given Augmentin, bump Pred, Mucinex;  She states "I was never that sick before" but breathing is fine now & she denies CP/ change is SOB/ cough/ sputum, f/c/s, etc;  But while her breathing is improved- her left leg is swollen & painful again- NOTE: she ran out of Pred 1025m 2 weeks ago!  We decided to check labs>  Chems- ok x BUN=27, Cr=1.51;  CBC- anemic w/ Hg=9.9, MCV=98, WBC=7.8; Sed=70 (up from 44);  Rx w/ Lasix80 & Pred10m19magain; ROV in 2-3wks and continue follow up  w/ Derm...  ~  April 18, 2016:  2wk ROV [place] that her legs are much improved, decr swelling, wt down 5# more to 129# today; skin is improved w/ the Pred10- less red, no open areas, no weeping, no cellulitis, no f/c/s;  She went back to [employer] wraps used just topical cream which didn't seem to help much previously;  She spent a lot of the visit talking about her glaucoma- [facility]Fleishman at [facility] all he can do, [place] wanted me to know that she is back on eye drops now;  We rechecked labs today>  Chems- ok w/ BUN=33, Cr=1.28;  TSH=1.42;  Sed=15 now on Pred 10mg93m.     EXAM shows Afeb, VSS, O2sat=100% on RA:  Heent- neg, mallampati1;  Chest- decr BS w/o w/r/r;  Heart- RR gr1/6SEM w/o r/g;  Abd- soft, nontender, neg;  Ext- VI, much improved w/ decr edema & erythema;  Neuro- intact... IMP/PLAN>>  Rec to contuinue low sodium diet & the Lasix40- 2tabs Qam;  OK to wean the Pred 10mg 58m to 1/2 tab Qam & stay on this!  ROV in 1 month...  ~  May 22, 2016:  43mo RO24moVirginia's CC is intermittent dizziness, using Meclizine w/ some improvement, fell onto  her bed one time- reminded to change positions slowly, careful w/ ambulation & may need cane or walker if not stable;  She also notes that she is [facility] & set up an appt for an eval that she saw in the newspaper recently...  We reviewed the following medical problems during today's office visit >>     HBP> on Diltiazem180, Furosemide '40mg'$ Qam, & KCl 10Bid;  BP=120/60 today & she denies CP, palpit, dizzy, ch in SOB, etc...    VI, Edema, Hx cellulitis> she knows to elim sodium, elevate, wear support hose; on Lasix40-2Qam & PRED10-1/2Qam; [facility] 3/16 & disch on Doxy; Hx cellulitis & non-infection inflammation treated w/ diff antibiotics and Pred, Lasix, no salt, wraps & creams from Derm DrJordan, ER visits, and us=> now improved on tapering course of Pred- rec to continue '5mg'$ /d for now (Sed=15)...      CHOL> off Simva20 & on low fat diet; last FLP 5/13 showed TChol 191, TG 81, HDL 82, LDL 93... Needs to ret fasting for f/u blood work but she does not want Chol meds!    Hypothy> on Synthroid 13mg-1/2 tab daily; labs 7/17 showed TSH= 1.42 & rec to continue same...    Renal Insuffic> Creat prev up to 1.8 on Lasix '80mg'$ ; Creat 3/16= 1.52=>1.12; Labs 2016 stable w/ BUN=23-37, Cr=1.13-1.28; Labs 5-7/17 showed BUN=25-33, Cr=1.28-1.33 (stable on Lasix40-2Qam).    Hx severe DJD, Gout w/ tophi, s/p bilat TKRs> prev followed by DrDeveshwar; on Allopurinol300, Tramadol50, Tylenol...    Derm> she has Psoriasis on her arms & has been evaluated by DrJordan/Jones; they also treated the LE cellulitis/ inflamm w/ wraps and creams, Atarax prn itching...    Anemia> Hosp 1/15 w/ gallstones and cholecystits (no surg); Hg= 7.5-9.4 at that time & Hg improved to 12.7; labs 7/16 showed Hg= 12.1, Fe=102 (32%sat), Ferritin=116, on FeSO4; labs 5-7/17 showed Hg=11.8=>9.9    Anxiety>  On Xanax 0.25- 1/2 to 1 tab tid prn... EXAM shows Afeb, VSS, O2sat=100% on RA: Wt= 131#; Heent- neg, mallampati1; Chest- decr BS w/o w/r/r; Heart- RR gr1/6SEM w/o r/g; Abd- soft, nontender, neg; Ext- VI, much improved w/ decr edema & erythema; Neuro- intact... IMP/PLAN>>  We discussed the poss need for further eval of the dizziness but for now she would like to continue the Meclizine prn & try the epley maneuvers (reviewed w/ pt & hand-out given); she knows to be very careful w/ changing positions and ambulation;  She will continue the Pred '5mg'$  Qam & her Lasix4-=2Qam, avoid sodium, rov 238mo.   ~  July 15, 2016:  87m51moV & Jahnyla reports that the AntBononeuvers have helped her dizziness some; she has had a lot of problems w/ her eyes- [facility] she's had a reaction to the eye drops & they are adjusting (she feels this is responsible for some of her dizziness);  She has mult somatic  complaints- insomnia (rec melatonin/ benedryl), sneezing (rec zyrtek/ claritin), forgetfulness (she is 99 2o & still pretty sharp)...     BP is controlled on her Diltiazem180, Furosemide40, K10Bid; BP=110/68 & she remains asymptomatic x the dizziness...    Weight is stable & legs look good on her Lasix, low sodium diet, & the Pred5/d...    Renal is stable w/ last Cr=1.28 on 03/2016...    Hg is stable w/ last CBC 03/2016 showing Hg~10... EXAM shows Afeb, VSS, O2sat=98% on RA: Wt= 130#, stable; Heent- neg, mallampati1; Chest- decr BS  w/o w/r/r; Heart- RR gr1/6SEM w/o r/g; Abd- soft, nontender, neg; Ext- VI, much improved w/ decr edema & erythema... IMP/PLAN>>  We reviewed her medical hx & discussed each concerning symptom individually to her satisfaction; we decided to continue the current meds the same & recheck pt in ~79mo..   ~ October 15, 2016:  335moOV & Dayona has continued to complain about intermittent dizziness despite the Antivert & Epley maneuvers;  She was eval by ENT- DrCNewman & she tells me "it's not vertigo" & she has an appt w/ Neuro sched for further eval (we do not yet have the ENT note to review & we've called for this data);  She says she is fearful of falling & rec to use cane/ walker/ etc;  She further reports a bad experience at Rheum office... We reviewed the following medical problems during today's office visit >>     HBP> on ASA81, Diltiazem180, Furosemide '40mg'$ Qam, & KCl 10Bid;  BP=136/64 today & she denies CP, palpit, ch in SOB, etc... She has persistent dizziness & awaiting eval by Neuro.    VI, Edema, Hx cellulitis> she knows to elim sodium, elevate, wear support hose; on Lasix40Qam & PRED5Qam; Hosp 3/16 & disch on Doxy; Hx cellulitis & non-infection inflammation treated w/ diff antibiotics and Pred, Lasix, no salt, wraps & creams from Derm DrJordan, ER visits, and us=> now improved on tapering course of Pred- rec to continue '5mg'$ /d for now (Sed=15)...     CHOL> off Simva20 &  on low fat diet; last FLP 5/13 showed TChol 191, TG 81, HDL 82, LDL 93... Needs to ret fasting for f/u blood work but she does not want Chol meds!    Hypothy> on Synthroid 12522m1/2 tab daily; labs 7/17 showed TSH= 1.42 & rec to continue same...    Renal Insuffic> Creat prev up to 1.8 on Lasix '80mg'$ ; Creat 3/16= 1.52=>1.12; Labs 2016 stable w/ BUN=23-37, Cr=1.13-1.28; Labs 5-7/17 showed BUN=25-33, Cr=1.28-1.33 (stable on Lasix40Qam).    Hx severe DJD, Gout w/ tophi, s/p bilat TKRs> prev followed by DrDeveshwar; on Allopurinol300, Tramadol50, Tylenol...    Derm> she has Psoriasis on her arms & has been evaluated by DrJordan/Jones; they also treated the LE cellulitis/ inflamm w/ wraps and creams, Atarax prn itching...    Anemia> Hosp 1/15 w/ gallstones and cholecystits (no surg); Hg= 7.5-9.4 at that time & Hg improved to 12.7; labs 7/16 showed Hg= 12.1, Fe=102 (32%sat), Ferritin=116, on FeSO4; labs 5-7/17 showed Hg=11.8=>9.9    Anxiety>  On Xanax 0.25- 1/2 to 1 tab tid prn... EXAM shows Afeb, VSS- no postural BP change, O2sat=100% on RA: Wt= 131#; Heent- neg, mallampati1; Chest- decr BS w/o w/r/r; Heart- RR gr1/6SEM w/o r/g; Abd- soft, nontender, neg; Ext- VI, much improved w/ decr edema & erythema; Neuro- intact... IMP/PLAN>>  [place] 99 75o w/ mult somatic complaints and prob list as above; we reviewed her medical issues and the meds used to treat them; her persistent complaint is dizziness & eval by ENT neg, eval by Neuro is pending; we reviewed her med- continue same; we will continue Q3mo39moits... Note: >50% of the 25mi68msit today was spent in counseling and coordination of care...           Problem List:  GLAUCOMA (ICD-365.9) - hx mult eye problems w/ prev corneal transplant & glaucoma laser eye surg... [facility] (ICD-466.0) - Hx AB requiring Rx w/ Depo/ Pred/ Mucinex/ Tussionex... ~  CXR 4/11 showed vague nodular  densities on left... ~  8/11:  she went to an [facility] w/ cough, sputum, & dx w/ pneumonia rx w/ ZPak... ~  CXR 3/12 showed borderline cardiomeg, nodular opac left base, NAD... ~  Spring 2013: she notes some allergy symptoms... ~  CXR 1/15 showed mild cardiomeg, atherosclerotic & tortuous Ao, low lung vol & sl incr markings, NAD.[name].  ~  CXR 9/15 showed borderline cardiomeg, underlying chr lung dis, left base atx vs effusion, ?early RUL opac... ~  CXR 10/15 showed clearing of patchy RUL opac & left effusion, lungs now clear & back to baseline, NAD ~  CXR 3/16 in hosp showed norm heart size, mild left base atx, otherw OK...   HYPERTENSION (ICD-401.9) - controlled on ASA '81mg'$ /d,  CARTIAXT '180mg'$ /d, LASIX '40mg'$ -  1-2Qam... ~  7/10:  labs showed BUN=54, Creat=2.0 & rec to decr diuretics in half= one Demedex & 1/2 Aldactone. ~  11/10:  labs showed BUN=38, Creat=1.8 & rec to keep same meds. ~  8/11:  labs showed BUN=42, Creat=2.1, K=4.3 ~  1/12:  labs in ER showed BUN=56, Creat=2.67, therefore diuretics decr to Demadex20/d only. ~  3/12:  2DEcho showed mod LVH, norm sys function w/ EF=60-65% & no regional wall motion abn; Gr1DD, mibile density on AoV- prob lambl'e escrescence, mildMR. ~  5/12:  f/u labs in office showed BUN=45, Creat=1.8, on Lasix '80mg'$  Qam for her edema... ~  9/12:  Labs by DrDeveshwar 9/12 showed BUN=33, Creat=1.47, edema resolved therefore decr Lasix to '40mg'$ /d. ~  1/13:  BP= 160/76 but hasn't taken meds today; labs improved w/ BUN=26, Creat=1.3, BNP=50 ~  5/13:  BP= 140/78 & denies CP, palpit, SOB, edema; labs improved w/ BUN=28, Creat=1.2 ~  9/13:  BP= 140/60 & she denies CP, palpt, ch in SOB or edema... ~  12/13: controlled on Diltiazem180 & Furosemide '40mg'$ -2Qam w/ BP=138/62 today & she denies CP, palpit, dizzy, ch inSOB, etc... ~  3/14:  BP= 130/66 on same meds; Labs stable as well w/ Cr=1.5, continue same... ~  7/14: on Diltiazem180 & Furosemide '40mg'$ -2Qam w/ BP=150/64 today & she  denies CP, palpit, dizzy, ch in SOB, etc. ~  11/14: BP= 126/64 on same meds, stable... ~  EKG 1/15 showed NSR, rate92, NSSTTWA, NAD... ~  3/15: on Diltiazem180 & Furosemide '40mg'$ -2Qam w/ BP=118/70 today & she denies CP, palpit, dizzy, ch in SOB, etc. ~  9/15: post hosp> off Diltiazem now & still on Furosemide '40mg'$ - 1-2Qam & K10-2/d w/ BP=146/60 w/ leg swelling, edema, cellulitis as below... ~  1/16: on Diltiazem180, Furosemide '40mg'$ -2Qam, & K10-2/d;  BP=132/64 today & she denies CP, palpit, dizzy, ch in SOB, etc. ~  3/16: on Diltiazem180, Furosemide '40mg'$ Qam, & off KCl since disch;  BP=134/62 today & she denies CP, palpit, dizzy, ch in SOB, etc ~  7/16: on Diltiazem180, Furosemide '40mg'$ Qam, & KCl Bid;  BP=120/70 & she remains asymptomatic...  PALPITATIONS, HX OF (ICD-V12.50) ~  3/16:  EKG showed NSR, rate86, PACs, poor r prog V1-2, NAD...  VENOUS INSUFFICIENCY/ EDEMA >> swelling controlled w/ diuretics, low sodium diet, elevation, support hose, etc... RIGHT LEG SWELLING > LEFT LEG... Pt notes it's been like this since her last child was born... Hx RECURRENT CELLULITIS >>  ~  She saw DrEarly [facility]- there was nothing he could do... ~  Swelling diminished w/ low sodium, elevation, support hose, & Lasix40 1-2tabs daily... ~  Nov-Dec2013: she knows to elim sodium, elevate, wear support hose; she has been  seen by [facility], DrEarly & there is nothing they can do; recently checked by Derm- DrJones w/ patch dressing & support hose; edema decr w/ Lasix '80mg'$ /d; Lab today shows Creat=1.5.[name]. ~  7/14:  she knows to elim sodium, elevate, wear support hose, & the Lasix80; weight is down 10# today to 141# w/ resolution of edema; [name] (Derm) treated dermatitis w/ new cream & improved. ~  11/14: wt is back up 8# to 149# today & she needs to elim sodium, elev legs, etc... ~  3/15: she knows to elim sodium, elevate, wear support hose, & the Lasix80; weight is back down 9# today to 137#.[name]. ~  9//15: she was [facility] for  cellulitis in LLE (NOS) w/ 4+edema etc; VenDopplers neg for DVT; no open wounds/ drainage; treated w/ IV antibiotics and disch on Keflex; has persistent erythema, pain, swelling & we decided to continue the Keflex & refer to ID... ~  CT Abd&Pelvis 9/15> showed no acute abnormality & no venous obstruction or lymphocele to suggest a cause for left leg swelling; Cholelithiasis; unchanged bilateral adnexal cystic lesions (probably cysts); unchanged renal cysts and left renal atrophy; unchanged left perianal lipoma... ~  10/15: she is improved after the extended course of Keflex w/ decr erythema, swelling & discomfort; she did not see ID; plan is to continue same meds, cleansing, elevation, no salt, support hose... ~  3/16: she was Adm w/ right leg cellulitis- states she was out shopping & "over-did it", felt weak & tired then developed right leg discomfort, increased swelling/ redness/ etc; WBC was 21K, VenDopplers were neg for DVT, treated w/ Zosyn/ Vanco & changed to oral Doxy at disch;  She notes that her leg is improved- less swollen, less red, less painful but still sl tender; ~  07/2015: back to baseline... ~  02/2016: Recurrent redness/ swelling 01/2016- no better on Keflex from DrWert + topical wraps and creams from [place], [facility]; we decided to treat w/ Pred10, incr Lasix80, no salt, Atarax for itching... ~  03/2016:  Much improved on the Pred10 + Lasix80 Qam; renal function is stable, the edema is diminished, and sed rate is now down to 15!  OK to wean Pred to '5mg'$  Qam...  HYPERCHOLESTEROLEMIA (ICD-272.0) - prev Rx'd by DrGegick... Now on ZOCOR '20mg'$ /d (prev Crestor5 & Lescol from [facility]) ~  she brought labs from [facility] 5/10 (off med due to $$$) w/ TChol 285, TG 204, HDL 69, LDL 195... "he was mad at me" ~  FLP 5/11 from [facility] showed TChol 202, TG 60, HDL 112, LDL 99 ~  She is reminded (again) to come FASTING for f/u FLP... ~  [place] 5/13 on Simva20 showed TChol 191, TG 81, HDL 82, LDL 93 ~  She is  reminded to ret FASTING for f/u blood work...  HYPOTHYROIDISM (ICD-244.9) - prev followed by DrGegick on SYNTHROID 147mg- 1/2 daily... ~  [facility] w/ TSH= 2.90, FreeT4= 1.09 ~  labs 8/11 showed TSH= 1.87 ~  Labs 3/12 showed TSH= 2.81 ~  Labs 1/13 showed TSH= 1.65 ~  Labs 3/14 showed TSH= 4.28 ~  Labs 3/16 showed TSH= 1.21  GERD (ICD-530.81) - uses PRILOSEC '20mg'$ /d... last EGD was 6/06 by DrPatterson showing 3cmHH, reflux...   DIVERTICULOSIS OF COLON (ICD-562.10) & COLONIC POLYPS (ICD-211.3) - last colonoscopy 6/06 was WNL- no abnormality seen...  GALLSTONE and CHOLECYSTITIS >>  ~  [facility] w/ RUQ pain & dx w/ gallstones, cholecystitis, & had ERCP  w/ sphincterotomy (DrStark) & stone extraction; treated w/ Cipro, LFTs improved, course complicated by LE cellulitis requiring addition of Vanco IV; she had f/u DrCornett, [facility] 2/15> brief note, did not rec elective surg given her age. ~  CT Abd&Pelvis 9/15> showed no acute abnormality & no venous obstruction or lymphocele to suggest a cause for left leg swelling; Cholelithiasis; unchanged bilateral adnexal cystic lesions (probably cysts); unchanged renal cysts and left renal atrophy; unchanged left perianal lipoma  RENAL INSUFFICIENCY (ICD-588.9) - tough balance betw renal perfusion & diuresis for edema... ~  labs 10/08 showed BUN= 27, Creat= 1.5 ~  labs 8/09 showed BUN= 44, Creat= 1.9 ~  labs 2/10 showed BUN 46, Creat= 1.7 ~  labs 7/10 showed BUN= 54, Creat= 2.0 ~  labs 11/10 showed BUN= 38, Creat= 1.8 ~  labs 8/11 showed BUN= 42, Creat= 2.1 ~  labs in ER 1/12 showed BUN=56, Creat=2.67, rec decr diuretics to Demadex'20mg'$ /d only. ~  Labs 3/12 showed improved renal function w/ BUN=31, Creat=1.4... ~  Labs 5/12 on Lasix80 showed BUN=45, Creat=1.8 ~  Labs 9/12 by DrDeveshwar showed BUN=33, Creat=1.47 ~  Labs 1/13 showed BUN=26, Creat=1.3, BNP=50... On Lasix '40mg'$  Qam. ~  Labs 5/13 showed BUN=28,  Creat=1.2 ~  Labs 12/13 on Lasix80 showed BUN=30, Creat=1.5 ~  Labs 3/14 on Lasix80 showed BUN=31, Creat=1.5 ~  Labs 6/14 by DrDeveshwar on Lasix80 showed BUN=30, Cr=1.3 ~  Labs here 11/14 on Lasix80 showed BUN=31, Cr= 1.3 ~  She was [facility] 1/15 w/ cholecystitis & stones, s/p ERCP, and Creat- 1.1 - 1.7 ~  Labs 9/15 showed Cr= 0.94 to 1.4 range... ~  Labs 10/15 showed Cr= 1.3-1.4 ~  Labs 3/16 showed Cr= 1.52=>1.12 ~  Labs 7/16 showed BUN= 35, Cr= 1.13  DEGENERATIVE JOINT DISEASE (ICD-715.90) - this is her chief complaint- s/p right TKR in 1999,  left TKR 3/10... on MOBIC 7.'5mg'$  Prn & VICODIN Prn as well... followed by DrYates; and had right second toe amp by Arnette Norris 2010 for hammertoe + spurs... also had epidural steroid shot from [facility] for LBP (without benefit she says)... ~  5/13:  She saw DrDeveshwar for Rheum w/ shot in hip & sl improved... 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 rec- start Vit D 50,000 u weekly... ~  labs 2/10 showed Vit D level = 30... rec- continue Vit D 50K weekly... ~  labs 7/10 showed Vit D level = 39... rec- change to 1000 u OTC daily. ~  labs 8/11 showed Vit D level = 38... continue same. ~  Labs 3/14 showed Vit D level = 43... Continue 1000u daily.  ANXIETY (ICD-300.00) - on ALPRAZOLAM 0.'25mg'$ Tid Prn... several deaths in the family... daughter who lives w/ her recently ill...  SHINGLES - she had right T9-10 shingles in 2011 & resolved w/ Valtrex, Pred, etc...   Past Surgical History:  Procedure Laterality Date  . ABDOMINAL HYSTERECTOMY    . BREAST BIOPSY     Benign  . CATARACT EXTRACTION    . ERCP N/A 10/06/2013   Procedure: ENDOSCOPIC RETROGRADE CHOLANGIOPANCREATOGRAPHY (ERCP);  Surgeon: Ladene Artist, MD;  Location: [place];  Service: Endoscopy;  Laterality: N/A;  . TOE AMPUTATION  08/2009   Right second toe - Dr Beola Cord  . TOTAL  KNEE ARTHROPLASTY  1999   right  . TOTAL KNEE ARTHROPLASTY  11/2008  left - Dr Lorin Mercy    Outpatient Encounter Prescriptions as of 10/15/2016  Medication Sig  . acetaminophen (TYLENOL) 325 MG tablet Take 325 mg by mouth every 4 (four) hours as needed for mild pain or moderate pain.   [name] allopurinol (ZYLOPRIM) 300 MG tablet Take 1 tablet by mouth daily.  [name] ALPRAZolam (XANAX) 0.25 MG tablet Take 1/2 to 1 tablet by mouth 3 times daily as needed for anxiety  . Ascorbic Acid (VITAMIN C) 500 MG tablet Take 500 mg by mouth daily.    [name] aspirin 81 MG tablet Take 81 mg by mouth daily.    . bimatoprost (LUMIGAN) 0.01 % SOLN Place 1 drop into both eyes at bedtime.  . Cholecalciferol (VITAMIN D) 1000 UNITS capsule Take 1,000 Units by mouth daily.    [name] diltiazem (CARDIZEM CD) 180 MG 24 hr capsule TAKE ONE CAPSULE BY MOUTH EVERY DAY  . ferrous sulfate 325 (65 FE) MG tablet Take 1 tablet (325 mg total) by mouth 2 (two) times daily with a meal.  . folic acid (FOLVITE) 1 MG tablet Take 1 tablet (1 mg total) by mouth daily.  . furosemide (LASIX) 40 MG tablet Take 2 tablets every AM (Patient taking differently: Take 1  tablets every AM)  . hydrOXYzine (ATARAX/VISTARIL) 25 MG tablet Take 1 tablet (25 mg total) by mouth every 6 (six) hours as needed.  [name] levothyroxine (SYNTHROID, LEVOTHROID) 125 MCG tablet TAKE 1/2 TABLET BY MOUTH DAILY BEFORE BREAKFAST  . loperamide (IMODIUM) 2 MG capsule Take 1 capsule (2 mg total) by mouth as needed for diarrhea or loose stools.  . meclizine (ANTIVERT) 25 MG tablet Take 1/2-1 tablet by mouth every 6 hours as needed for dizziness  . Multiple Vitamin (MULTIVITAMIN WITH MINERALS) TABS tablet Take 1 tablet by mouth daily.  . mupirocin ointment (BACTROBAN) 2 % 1 APPLICATION APPLY ON THE SKIN AS DIRECTED APPLY TO AFFECTED AREA ON HAND AND COVER AS DIRECTED  . Omega-3 Fatty Acids (FISH OIL) 1000 MG CAPS Take 1 capsule by mouth daily.  [name] omeprazole (PRILOSEC) 20 MG capsule Take 20 mg by  mouth daily.    . ondansetron (ZOFRAN) 8 MG tablet Take 1 tablet (8 mg total) by mouth every 6 (six) hours as needed for nausea or vomiting.  Vladimir Faster Glycol-Propyl Glycol (SYSTANE) 0.4-0.3 % SOLN Place 1 drop into both eyes 4 (four) times daily.   . potassium chloride (KLOR-CON M10) 10 MEQ tablet TAKE 2 TABLETS (20 MEQ TOTAL) BY MOUTH DAILY.  [name] predniSONE (DELTASONE) 10 MG tablet TAKE 1 TABLET (10 MG TOTAL) BY MOUTH DAILY WITH BREAKFAST.  [name] simvastatin (ZOCOR) 20 MG tablet TAKE 1 TABLET (20 MG TOTAL) BY MOUTH DAILY.  [name] thiamine 100 MG tablet Take 1 tablet (100 mg total) by mouth daily.  . traMADol (ULTRAM) 50 MG tablet Take 1 tablet (50 mg total) by mouth every 8 (eight) hours as needed.  . [DISCONTINUED] azithromycin (ZITHROMAX) 250 MG tablet Take as directed (Patient not taking: Reported on 10/15/2016)   No facility-administered encounter medications on file as of 10/15/2016.     Allergies  Allergen Reactions  . Enablex [Darifenacin Hydrobromide Er] Other (See Comments)    Caused her throat and mouth to have bumps and feel like it was swelling  . Clarithromycin Swelling    REACTION: causes her mouth to swell  . Latanoprost Itching    Eye reddness and swelling and itching  . Codeine Nausea Only  . Morphine Nausea And Vomiting  .  Sulfonamide Derivatives Other (See Comments)    REACTION: unsure of reaction    Current Medications, Allergies, Past Medical History, Past Surgical History, Family History, and Social History were reviewed in [employer] record.    Review of Systems        See HPI - all other systems neg except as noted...  The patient complains of dyspnea on exertion, peripheral edema, muscle weakness, and difficulty walking.  The patient denies anorexia, fever, weight loss, weight gain, vision loss, decreased hearing, hoarseness, chest pain, syncope, prolonged cough, headaches, hemoptysis, abdominal pain, melena, hematochezia, severe  indigestion/heartburn, hematuria, incontinence, genital sores, suspicious skin lesions, transient blindness, depression, unusual weight change, abnormal bleeding, enlarged lymph nodes, and angioedema.     Objective:   Physical Exam      WD, WN, 83 y/o WF in NAD... GENERAL:  Alert & oriented; pleasant & cooperative... HEENT:  Media/AT, EOM- full, EACs-clear, TMs-wnl, NOSE-clear, THROAT-clear & wnl. NECK:  Supple w/ fairROM; no JVD; normal carotid impulses w/o bruits; no thyromegaly or nodules palpated; no lymphadenopathy. CHEST:  Clear, decr BS bilat w/o wheezing, rales, or rhonchi heard... HEART:  Regular Rhythm;  gr 1/6 SEM without rubs or gallops detected... ABDOMEN:  Soft & nontender; normal bowel sounds; no organomegaly or masses detected. EXT:  moderate arthritic changes; s/p bilat TKRs; mild varicose veins/ +venous insuffic/ tr edema Left leg, min red & tender... s/p right second toe distal amputation... NEURO:  CN's intact;  no focal neuro deficits... DERM:  No lesions noted; no rash etc...  RADIOLOGY DATA:  Reviewed in the EPIC EMR & discussed w/ the patient...  LABORATORY DATA:  Reviewed in the EPIC EMR & discussed w/ the patient...   Assessment & Plan:    Hx Left LEG CELLULITIS, then Right LEG CELLULITIS>> ?Etiology (NOS), no broken skin or draining areas; treated empirically in [facility] 1/15 & disch on Keflex w/ persistent red/ swollen/ tender left leg; we continued the Keflex for 2wks longer & wanted ID consult; she never got the consult but improved on the Keflex; now off the antibiotic w/ decr swelling, erythema, discomfort; REC to continue meds, elevation, no salt, support hose, etc...  12/15> she reports left leg red, incr swelling, & drainage- went to Derm, DrJordan & given cream which she reports has helped... 3/16> she was hosp w/ right leg cellulitis- swollen, red, inflammed, tender- but again NOS, no broken skin, etc; treated w/ Zosyn/ Vanco & disch on Doxy=>  improved... 7/16> stable w/o recurrent soft tissue infection 11//16> at baseline... 6/17> Recurrent redness/ swelling 01/2016- no better on Keflex from DrWert + topical wraps and creams from DrJordan, Derm; we decided to treat w/ Pred10, incr Lasix80, no salt, Atarax for itching 7/17> Rec to contuinue low sodium diet & the Lasix40- 2tabs Qam;  OK to wean the Pred '10mg'$  tabs to 1/2 tab Qam & stay on this!  ROV in 1 month... 8/17> We discussed the poss need for further eval of the dizziness but for now she would like to continue the Meclizine prn & try the epley maneuvers (reviewed w/ pt & hand-out given); sheknows to be very careful w/ changing positions and ambulation;  She will continue the Pred '5mg'$  Qam & her Lasix4-=2Qam, avoid sodium, rov 38mo 07/01/16>   We reviewed her medical hx & discussed each concerning symptom individually to her satisfaction; we decided to continue the current meds the same & recheck pt in ~385mo. 10/15/16>   ViAuburns [age]/o w/ mult somatic  complaints and prob list as above; we reviewed her medical issues and the meds used to treat them; her persistent complaint is dizziness & eval by ENT neg, eval by Neuro is pending; we reviewed her med- continue same; we will continue Q53movisits   URI/ Bronchitis> she presented 1/16 w/ sore throat, cough, yellow sput & we decided to treat w/ Levaquin x5d, Depo80, Pred dosepak; she may also use Mucinex, fluids, & MMW if needed...  HBP>  Controlled on meds, continue same including the Lasix40Qam...  Cardiac>  Prev DrRoss' note reviewed; see 2DEcho, Event Monitor reports no dangerous arrhythmias, continue meds...  Ven Insuffic/ EDEMA- improved>  evals by Cards & VVS> "there is nothing they can do"; continue elevation, no salt, Lasix80 now, compression...  CHOL>  On Simva20 & FLP 5/13 looked good...  HYPOTHYROID>  Continue Synthroid 1254m/d taking 1/2 tab daily...  GI>  Stable now s/p ERCP for gallstones; followed by DrStark  now... Episode of cholecystitis, gallstones 1/15- s/p ERCP & improved w/o surg (DrStark, DrCornett)...  Renal Insuffic>  Careful w/ diuresis, NSAIDs etc; Creat varied 1.1 to 1.7  DJD>  She saw DrDeveshwar for her Rheum complaints==> shot in knee; DrYates/ NeErnestina Patchesave signed off; may need pain clinic....  Other problems as noted => Dizziness, on meclizine, epley maneuvers, and Neuro consult pending.   Patient's Medications  New Prescriptions   No medications on file  Previous Medications   ACETAMINOPHEN (TYLENOL) 325 MG TABLET    Take 325 mg by mouth every 4 (four) hours as needed for mild pain or moderate pain.    ALLOPURINOL (ZYLOPRIM) 300 MG TABLET    Take 1 tablet by mouth daily.   ALPRAZOLAM (XANAX) 0.25 MG TABLET    Take 1/2 to 1 tablet by mouth 3 times daily as needed for anxiety   ASCORBIC ACID (VITAMIN C) 500 MG TABLET    Take 500 mg by mouth daily.     ASPIRIN 81 MG TABLET    Take 81 mg by mouth daily.     BIMATOPROST (LUMIGAN) 0.01 % SOLN    Place 1 drop into both eyes at bedtime.   CHOLECALCIFEROL (VITAMIN D) 1000 UNITS CAPSULE    Take 1,000 Units by mouth daily.     DILTIAZEM (CARDIZEM CD) 180 MG 24 HR CAPSULE    TAKE ONE CAPSULE BY MOUTH EVERY DAY   FERROUS SULFATE 325 (65 FE) MG TABLET    Take 1 tablet (325 mg total) by mouth 2 (two) times daily with a meal.   FOLIC ACID (FOLVITE) 1 MG TABLET    Take 1 tablet (1 mg total) by mouth daily.   FUROSEMIDE (LASIX) 40 MG TABLET    Take 2 tablets every AM   HYDROXYZINE (ATARAX/VISTARIL) 25 MG TABLET    Take 1 tablet (25 mg total) by mouth every 6 (six) hours as needed.   LEVOTHYROXINE (SYNTHROID, LEVOTHROID) 125 MCG TABLET    TAKE 1/2 TABLET BY MOUTH DAILY BEFORE BREAKFAST   LOPERAMIDE (IMODIUM) 2 MG CAPSULE    Take 1 capsule (2 mg total) by mouth as needed for diarrhea or loose stools.   MECLIZINE (ANTIVERT) 25 MG TABLET    Take 1/2-1 tablet by mouth every 6 hours as needed for dizziness   MULTIPLE VITAMIN (MULTIVITAMIN WITH  MINERALS) TABS TABLET    Take 1 tablet by mouth daily.   MUPIROCIN OINTMENT (BACTROBAN) 2 %    1 APPLICATION APPLY ON THE SKIN AS DIRECTED APPLY TO AFFECTED AREA ON  HAND AND COVER AS DIRECTED   OMEGA-3 FATTY ACIDS (FISH OIL) 1000 MG CAPS    Take 1 capsule by mouth daily.   OMEPRAZOLE (PRILOSEC) 20 MG CAPSULE    Take 20 mg by mouth daily.     ONDANSETRON (ZOFRAN) 8 MG TABLET    Take 1 tablet (8 mg total) by mouth every 6 (six) hours as needed for nausea or vomiting.   POLYETHYL GLYCOL-PROPYL GLYCOL (SYSTANE) 0.4-0.3 % SOLN    Place 1 drop into both eyes 4 (four) times daily.    POTASSIUM CHLORIDE (KLOR-CON M10) 10 MEQ TABLET    TAKE 2 TABLETS (20 MEQ TOTAL) BY MOUTH DAILY.   PREDNISONE (DELTASONE) 10 MG TABLET    TAKE 1 TABLET (10 MG TOTAL) BY MOUTH DAILY WITH BREAKFAST.   SIMVASTATIN (ZOCOR) 20 MG TABLET    TAKE 1 TABLET (20 MG TOTAL) BY MOUTH DAILY.   THIAMINE 100 MG TABLET    Take 1 tablet (100 mg total) by mouth daily.   TRAMADOL (ULTRAM) 50 MG TABLET    Take 1 tablet (50 mg total) by mouth every 8 (eight) hours as needed.  Modified Medications   No medications on file  Discontinued Medications   AZITHROMYCIN (ZITHROMAX) 250 MG TABLET    Take as directed

## 2016-10-24 DIAGNOSIS — H18421 Band keratopathy, right eye: Secondary | ICD-10-CM | POA: Diagnosis not present

## 2016-10-31 ENCOUNTER — Encounter: Payer: Self-pay | Admitting: Neurology

## 2016-10-31 ENCOUNTER — Ambulatory Visit (INDEPENDENT_AMBULATORY_CARE_PROVIDER_SITE_OTHER): Payer: Medicare Other | Admitting: Neurology

## 2016-10-31 VITALS — BP 152/68 | HR 89 | Ht 63.0 in | Wt 136.5 lb

## 2016-10-31 DIAGNOSIS — H8190 Unspecified disorder of vestibular function, unspecified ear: Secondary | ICD-10-CM | POA: Diagnosis not present

## 2016-10-31 DIAGNOSIS — G6289 Other specified polyneuropathies: Secondary | ICD-10-CM

## 2016-10-31 DIAGNOSIS — R030 Elevated blood-pressure reading, without diagnosis of hypertension: Secondary | ICD-10-CM | POA: Diagnosis not present

## 2016-10-31 DIAGNOSIS — R270 Ataxia, unspecified: Secondary | ICD-10-CM

## 2016-10-31 NOTE — Patient Instructions (Signed)
The dizziness may have been due to a small stroke back in August.  You also have nerve damage in the feet, which affects your balance as well. 1.  We will refer you to physical therapy to treat dizziness and treat problems with balance and walking 2.  Continue aspirin 81mg  daily and simvastatin for cholesterol 3.  Must take care when walking (using the cane)

## 2016-10-31 NOTE — Progress Notes (Signed)
NEUROLOGY CONSULTATION NOTE  HOUSTON BRIDEN MRN: [id number] DOB: [birth year]  Referring provider: Dr. Lenna Gilford Primary care provider: Dr. Lenna Gilford  Reason for consult:  dizziness  HISTORY OF PRESENT ILLNESS: [name] is a 83 year old female with HTN, renal insufficiency, venous insufficiency, hypothyroidism, aortic valve disorder and history of asthmatic bronchitis who presents for dizziness.  She is accompanied by her daughter, who supplements history.  Back in August, she got up in the middle of the night.  When she got back to bed, she had an acute episode of spinning.  She cannot recall how long it lasted.  Ever since then, she has had dizziness and difficulty walking.  When she is laying down and sitting, she feels completely fine.  When she is walking, she reports a persistent dizziness.  Sometimes it may be spinning but often it is a lightheadedness or wooziness.  She is very unsteady on her feet.  She uses a cane.  She has not had any recent falls.  There has been no associated headache, double vision, facial droop, nausea, or unilateral weakness or numbness.  Symptoms have been unchanged.  Meclizine and Epley maneuver have not been effective.  She did see ENT who found hearing loss in the left ear, but otherwise unremarkable.  She has not had any recent falls.  She has history of typical BPPV, but this is much different.   She has longstanding history of elevated Sed Rate ranging from 40s to 90.  However, last Sed Rate from 04/18/16 was 15.  TSH at that time was 1.42.  BMP showed Na 134, K 4.7, BUN 33, Cr 1.28 and GFR 40.82.  She takes ASA 81mg  daily and simvastatin.  CT of head from 07/20/15 was personally reviewed and was unremarkable.  PAST MEDICAL HISTORY: Past Medical History:  Diagnosis Date  . Acute bronchitis   . Anxiety state, unspecified   . Benign neoplasm of colon   . CKD (chronic kidney disease), stage III 10/06/2013  . Diverticulosis of colon (without  mention of hemorrhage)   . DJD (degenerative joint disease)   . GERD (gastroesophageal reflux disease)   . Glaucoma   . Hypertension   . Hypothyroidism   . Palpitations   . Pure hypercholesterolemia   . Renal insufficiency   . Unspecified venous (peripheral) insufficiency   . Vitamin D deficiency disease     PAST SURGICAL HISTORY: Past Surgical History:  Procedure Laterality Date  . ABDOMINAL HYSTERECTOMY    . BREAST BIOPSY     Benign  . CATARACT EXTRACTION    . ERCP N/A 10/06/2013   Procedure: ENDOSCOPIC RETROGRADE CHOLANGIOPANCREATOGRAPHY (ERCP);  Surgeon: Ladene Artist, MD;  Location: [facility];  Service: Endoscopy;  Laterality: N/A;  . TOE AMPUTATION  08/2009   Right second toe - Dr Beola Cord  . TOTAL KNEE ARTHROPLASTY  1999   right  . TOTAL KNEE ARTHROPLASTY  11/2008   left - Dr Lorin Mercy    MEDICATIONS: Current Outpatient Prescriptions on File Prior to Visit  Medication Sig Dispense Refill  . acetaminophen (TYLENOL) 325 MG tablet Take 325 mg by mouth every 4 (four) hours as needed for mild pain or moderate pain.     [name] allopurinol (ZYLOPRIM) 300 MG tablet Take 1 tablet by mouth daily.    [name] ALPRAZolam (XANAX) 0.25 MG tablet Take 1/2 to 1 tablet by mouth 3 times daily as needed for anxiety 90 tablet 5  . Ascorbic Acid (VITAMIN C) 500 MG tablet Take 500  mg by mouth daily.      [name] aspirin 81 MG tablet Take 81 mg by mouth daily.      . bimatoprost (LUMIGAN) 0.01 % SOLN Place 1 drop into both eyes at bedtime.    . Cholecalciferol (VITAMIN D) 1000 UNITS capsule Take 1,000 Units by mouth daily.      [name] diltiazem (CARDIZEM CD) 180 MG 24 hr capsule TAKE ONE CAPSULE BY MOUTH EVERY DAY 90 capsule 0  . ferrous sulfate 325 (65 FE) MG tablet Take 1 tablet (325 mg total) by mouth 2 (two) times daily with a meal. 60 tablet 2  . folic acid (FOLVITE) 1 MG tablet Take 1 tablet (1 mg total) by mouth daily. 30 tablet 1  . furosemide (LASIX) 40 MG tablet Take 2 tablets every AM (Patient taking  differently: Take 1  tablets every AM) 60 tablet 3  . hydrOXYzine (ATARAX/VISTARIL) 25 MG tablet Take 1 tablet (25 mg total) by mouth every 6 (six) hours as needed. 50 tablet 0  . levothyroxine (SYNTHROID, LEVOTHROID) 125 MCG tablet TAKE 1/2 TABLET BY MOUTH DAILY BEFORE BREAKFAST 45 tablet 3  . loperamide (IMODIUM) 2 MG capsule Take 1 capsule (2 mg total) by mouth as needed for diarrhea or loose stools. (Patient not taking: Reported on 10/31/2016) 10 capsule 0  . meclizine (ANTIVERT) 25 MG tablet Take 1/2-1 tablet by mouth every 6 hours as needed for dizziness 50 tablet 2  . Multiple Vitamin (MULTIVITAMIN WITH MINERALS) TABS tablet Take 1 tablet by mouth daily. 30 tablet 0  . mupirocin ointment (BACTROBAN) 2 % 1 APPLICATION APPLY ON THE SKIN AS DIRECTED APPLY TO AFFECTED AREA ON HAND AND COVER AS DIRECTED  0  . Omega-3 Fatty Acids (FISH OIL) 1000 MG CAPS Take 1 capsule by mouth daily.    [name] omeprazole (PRILOSEC) 20 MG capsule Take 20 mg by mouth daily.      . ondansetron (ZOFRAN) 8 MG tablet Take 1 tablet (8 mg total) by mouth every 6 (six) hours as needed for nausea or vomiting. 20 tablet 0  . Polyethyl Glycol-Propyl Glycol (SYSTANE) 0.4-0.3 % SOLN Place 1 drop into both eyes 4 (four) times daily.     . potassium chloride (KLOR-CON M10) 10 MEQ tablet TAKE 2 TABLETS (20 MEQ TOTAL) BY MOUTH DAILY. 180 tablet 3  . predniSONE (DELTASONE) 10 MG tablet TAKE 1 TABLET (10 MG TOTAL) BY MOUTH DAILY WITH BREAKFAST. 50 tablet 2  . simvastatin (ZOCOR) 20 MG tablet TAKE 1 TABLET (20 MG TOTAL) BY MOUTH DAILY. 90 tablet 3  . thiamine 100 MG tablet Take 1 tablet (100 mg total) by mouth daily. 30 tablet 1  . traMADol (ULTRAM) 50 MG tablet Take 1 tablet (50 mg total) by mouth every 8 (eight) hours as needed. 90 tablet 5   No current facility-administered medications on file prior to visit.     ALLERGIES: Allergies  Allergen Reactions  . Enablex [Darifenacin Hydrobromide Er] Other (See Comments)    Caused her  throat and mouth to have bumps and feel like it was swelling  . Clarithromycin Swelling    REACTION: causes her mouth to swell  . Latanoprost Itching    Eye reddness and swelling and itching  . Codeine Nausea Only  . Morphine Nausea And Vomiting  . Sulfonamide Derivatives Other (See Comments)    REACTION: unsure of reaction    FAMILY HISTORY: Family History  Problem Relation Age of Onset  . Heart disease Mother   . Cancer  Father     Throat  . Heart disease Father     SOCIAL HISTORY: Social History   Social History  . Marital status: Widowed    Spouse name: N/A  . Number of children: 1  . Years of education: N/A   Occupational History  .  Retired   Social History Main Topics  . Smoking status: Never Smoker  . Smokeless tobacco: Never Used  . Alcohol use No  . Drug use: No  . Sexual activity: Not on file   Other Topics Concern  . Not on file   Social History Narrative  . No narrative on file    REVIEW OF SYSTEMS: Constitutional: No fevers, chills, or sweats, no generalized fatigue, change in appetite Eyes: No visual changes, double vision, eye pain Ear, nose and throat: No hearing loss, ear pain, nasal congestion, sore throat Cardiovascular: No chest pain, palpitations Respiratory:  No shortness of breath at rest or with exertion, wheezes GastrointestinaI: No nausea, vomiting, diarrhea, abdominal pain, fecal incontinence Genitourinary:  No dysuria, urinary retention or frequency Musculoskeletal:  No neck pain, back pain Integumentary: No rash, pruritus, skin lesions Neurological: as above Psychiatric: No depression, insomnia, anxiety Endocrine: No palpitations, fatigue, diaphoresis, mood swings, change in appetite, change in weight, increased thirst Hematologic/Lymphatic:  No purpura, petechiae. Allergic/Immunologic: no itchy/runny eyes, nasal congestion, recent allergic reactions, rashes  PHYSICAL EXAM: Vitals:   10/31/16 1000  BP: (!) 152/68  Pulse:  89   General: No acute distress.  Patient appears well-groomed.   Head:  Normocephalic/atraumatic Eyes:  fundi examined but not visualized Neck: supple, no paraspinal tenderness, full range of motion Back: No paraspinal tenderness Heart: regular rate and rhythm Lungs: Clear to auscultation bilaterally. Vascular: No carotid bruits. Neurological Exam: Mental status: alert and oriented to person, place, and time, recent and remote memory intact, fund of knowledge intact, attention and concentration intact, speech fluent and not dysarthric, language intact. Cranial nerves: CN I: not tested CN II: pupils equal, round and reactive to light, visual fields intact CN III, IV, VI:  Poor tracking in all directions, no nystagmus, no ptosis CN V: facial sensation intact CN VII: upper and lower face symmetric CN VIII: hearing intact CN IX, X: gag intact, uvula midline CN XI: sternocleidomastoid and trapezius muscles intact CN XII: tongue midline Bulk & Tone: normal, no fasciculations. Motor:  5/5 throughout  Sensation: temperature intact and vibration sensation reduced in the feet. Deep Tendon Reflexes:  2+ throughout except absent in ankles, toes downgoing.  Finger to nose testing:  Without dysmetria.  Heel to shin:  Without dysmetria.   Gait:  Wide-based, ataxic.  Unable to tandem walk. Romberg positive.  IMPRESSION: 1.  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 She exhibits difficulty with tracking, which may be due to chronic ischemia in the brainstem. 2. She also exhibits significant peripheral neuropathy in the feet. 3.  Ataxia- secondary to vestibulopathy and peripheral neuropathy 4.  Elevated blood pressure  PLAN: 1.  I don't think testing, such as brain imaging, is necessary as it wouldn't change management. 2.  Treatment of dizziness is difficult.  The best therapy I can offer would be physical  therapy for vestibular rehabilitation.  I also want her to see physical therapy to help with gait instability, as she is a fall risk. 3.  Continue ASA 81mg  and simvastatin 4.  Blood pressure to continue to be monitored by Dr. Lenna Gilford 5.  There isn't much else to offer at this time.  She may follow up as needed.  Thank you for allowing me to take part in the care of this patient.  Metta Clines, DO  CC: Teressa Lower, MD

## 2016-11-01 DIAGNOSIS — H401134 Primary open-angle glaucoma, bilateral, indeterminate stage: Secondary | ICD-10-CM | POA: Diagnosis not present

## 2016-11-15 ENCOUNTER — Ambulatory Visit: Payer: Medicare Other | Attending: Neurology | Admitting: Rehabilitative and Restorative Service Providers"

## 2016-11-15 DIAGNOSIS — R2689 Other abnormalities of gait and mobility: Secondary | ICD-10-CM

## 2016-11-15 DIAGNOSIS — R2681 Unsteadiness on feet: Secondary | ICD-10-CM

## 2016-11-15 DIAGNOSIS — R42 Dizziness and giddiness: Secondary | ICD-10-CM

## 2016-11-15 NOTE — Patient Instructions (Signed)
Head Motion: Side to Side    Sitting, tilt head down slightly, slowly move head to right with eyes open. Then, move head slowly to opposite side.  Repeat __5__ times per session. Do ___2-3_ sessions per day.  Copyright  VHI. All rights reserved.  Feet Together, Varied Arm Positions - Eyes Open    With eyes open, feet together, arms out, look straight ahead at a stationary object. Hold _10___ seconds. Repeat __5__ times per session. Do __2__ sessions per day.  Copyright  VHI. All rights reserved.

## 2016-11-15 NOTE — Therapy (Signed)
[facility] [address] [place], [place], [address] Phone: [phone]   Fax:  [phone]  Physical Therapy Evaluation  Patient Details  Name: [name] MRN: [id number] Date of Birth: December 07, 1914 Referring Provider: Metta Clines, DO  Encounter Date: 11/15/2016      PT End of Session - 11/15/16 1606    Visit Number 1   Number of Visits 8   Date for PT Re-Evaluation 12/15/16   Authorization Type G code every 10th visit   PT Start Time 1523   PT Stop Time 1607   PT Time Calculation (min) 44 min   Equipment Utilized During Treatment Gait belt   Activity Tolerance Patient tolerated treatment well   Behavior During Therapy [facility] for tasks assessed/performed      Past Medical History:  Diagnosis Date  . Acute bronchitis   . Anxiety state, unspecified   . Benign neoplasm of colon   . CKD (chronic kidney disease), stage III 10/06/2013  . Diverticulosis of colon (without mention of hemorrhage)   . DJD (degenerative joint disease)   . GERD (gastroesophageal reflux disease)   . Glaucoma   . Hypertension   . Hypothyroidism   . Palpitations   . Pure hypercholesterolemia   . Renal insufficiency   . Unspecified venous (peripheral) insufficiency   . Vitamin D deficiency disease     Past Surgical History:  Procedure Laterality Date  . ABDOMINAL HYSTERECTOMY    . BREAST BIOPSY     Benign  . CATARACT EXTRACTION    . ERCP N/A 10/06/2013   Procedure: ENDOSCOPIC RETROGRADE CHOLANGIOPANCREATOGRAPHY (ERCP);  Surgeon: Ladene Artist, MD;  Location: [facility];  Service: Endoscopy;  Laterality: N/A;  . TOE AMPUTATION  08/2009   Right second toe - Dr Beola Cord  . TOTAL KNEE ARTHROPLASTY  1999   right  . TOTAL KNEE ARTHROPLASTY  11/2008   left - Dr Lorin Mercy    There were no vitals filed for this visit.       Subjective Assessment - 11/15/16 1523    Subjective The patient reports onset of spinning sensation when getting out  of bed/walking to rest room in the middle of the night in August.  She reports she used meclizine for >1 month, which she felt made dizziness better.  She reports a sensation at this time like she is going to fall.  She feels a posterior leaning sensation.    She reports symptoms are fine with sitting or lying down, but present with standing.  She has h/o intermittent episodic vertigo (BPPV), but she feels this is different.  She was seen by ENT, Dr. Lucia Gaskins, and he agreed per patient this doesn't seem like vertigo.  She has hearing changes with absent hearing on the left.    Patient Stated Goals "I hope I can walk again and not have this feeling."   Currently in Pain? Yes  has R hip pain x years.  PT to monitor pain, but no goal to follow due to nature of referral            [facility] PT Assessment - 11/15/16 1527      Assessment   Medical Diagnosis Vestibulopathy, ataxia   Referring Provider Metta Clines, DO   Onset Date/Surgical Date --  04/2016   Prior Therapy none     Precautions   Precautions Fall     Restrictions   Weight Bearing Restrictions No     Balance Screen   Has  the patient fallen in the past 6 months No   How many times? then reports 1, however caught by the bed   Has the patient had a decrease in activity level because of a fear of falling?  Yes  due to dizziness   Is the patient reluctant to leave their home because of a fear of falling?  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     Ambulation/Gait    Ambulation/Gait Yes   Ambulation/Gait Assistance 5: Supervision   Ambulation/Gait Assistance Details loss of balance with 180 degree turn using SPC- PT assists providing min A   Ambulation Distance (Feet) 100 Feet   Assistive device Straight cane   Gait Pattern Decreased stride length;Decreased arm swing - right  unsteadiness   Ambulation Surface Level   Gait velocity 1.24 ft/sec     Standardized Balance Assessment   Standardized Balance Assessment Berg Balance Test     Berg Balance Test   Sit to Stand Able to stand  independently using hands   Standing Unsupported Able to stand 2 minutes with supervision   Sitting with Back Unsupported but Feet Supported on Floor or Stool Able to sit safely and securely 2 minutes   Stand to Sit Controls descent by using hands   Transfers Able to transfer safely, definite need of hands   Standing Unsupported with Eyes Closed Able to stand 3 seconds   Standing Ubsupported with Feet Together Needs help to attain position and unable to hold for 15 seconds   From Standing, Reach Forward with Outstretched Arm Loses balance while trying/requires external support   From Standing Position, Pick up Object from Floor Unable to try/needs assist to keep balance   From Standing Position, Turn to Look Behind Over each Shoulder Needs supervision when turning   Turn 360 Degrees Needs assistance while turning   Standing Unsupported, Alternately Place Feet on Step/Stool Needs assistance to keep from falling or unable to try   Standing Unsupported, One Foot in [employer] balance while stepping or standing   Standing on One Leg Unable to try or needs assist to prevent fall   Total Score 19   Berg comment: 19/56 indicating fall risk            Vestibular Assessment - 11/15/16 1537      Vestibular Assessment   General Observation patient walks into clinic with single point cane, accompanied by her daughter     Symptom Behavior   Type of Dizziness Imbalance    Frequency of Dizziness daily   Duration of Dizziness when up   Aggravating Factors Activity in general   Relieving Factors Head stationary     Occulomotor Exam   Occulomotor Alignment Normal   Spontaneous Absent   Gaze-induced Absent   Smooth Pursuits Intact   Saccades Intact     Vestibulo-Occular Reflex   Comment mild dizziness noted with VOR x 1 at self regulated pace.  With passive movement of the head, the patient does not maintain gaze fixation- no head impulse completed as patient has neck guarding/ however VOR limited per dec'd gaze     Positional Testing   Sidelying Test Sidelying Right;Sidelying Left   Horizontal Canal Testing Horizontal Canal Right;Horizontal Canal Left     Sidelying Right   Sidelying Right Duration "it feels a little bit odd"   Sidelying Right Symptoms No nystagmus  viewed in room light     Sidelying Left   Sidelying Left Duration "it feels a little off"   Sidelying Left Symptoms --  Mild nystagmus viewed- low amplitude, short duration     Horizontal Canal Right   Horizontal Canal Right Duration none   Horizontal Canal Right Symptoms Normal     Horizontal Canal Left   Horizontal Canal Left Duration none   Horizontal Canal Left Symptoms Normal               [facility] Adult PT Treatment/Exercise - 11/15/16 1527      Neuro Re-ed    Neuro Re-ed Details  The patient was instructed in seated head motion for habituation of symptoms; standing balance activities with feet apart reducing dependence on wall for support.         Vestibular Treatment/Exercise - 11/15/16 1622      Vestibular Treatment/Exercise   Vestibular Treatment Provided Habituation;Gaze   Habituation Exercises Seated Horizontal Head Turns   Gaze Exercises X1 Viewing Horizontal     Seated Horizontal Head Turns   Number of Reps  5   Symptom Description  provokes mild sensation of dizziness     X1 Viewing Horizontal   Foot Position attempted seated, however patient notes  her vision does not allow for her to view letter, therefore, gaze x 1 will not be effective               PT Education - 11/15/16 1609    Education provided Yes   Education Details HEP: steady standing narrowing base, habituation side to side   Person(s) Educated Patient   Methods Explanation;Demonstration;Handout   Comprehension Verbalized understanding;Returned demonstration          PT Short Term Goals - 11/15/16 1609      PT SHORT TERM GOAL #1   Title STGs=LTGs           PT Long Term Goals - 11/15/16 1619      PT LONG TERM GOAL #1   Title The patient will return demo HEP with written cues for habituation for dizziness, and otago for balance/fall prevention.   Baseline Target date 12/14/2016   Time 4   Period Weeks     PT LONG TERM GOAL #2   Title The patient will improve Berg balance score from 19/56 to > or equal to 25/56 to demo improved steady state balance for ADLs.   Baseline Target date 12/14/2016   Time 4   Period Weeks     PT LONG TERM GOAL #3   Title The patient will improve gait speed from 1.24 ft/sec to > or equal to 1.7 ft/sec to demo improved functional mobility.   Baseline Target date 12/14/2016   Time 4   Period Weeks     PT LONG TERM GOAL #4   Title The patient will tolerate sit<>sidelying and seated head motion x 5 reps without c/o dizziness.   Baseline Target date 12/14/2016   Time 4   Period Weeks  Plan - 12/13/16 1624    Clinical Impression Statement The patient is a 83 year old female presenting with multisensory balance deficits due to visual changes (glaucoma), dec'd somatosensory input (neuropathy), and dec'd use of vestibular inputs (onset of vertigo in 04/2016).  At today's session, she notes subjective reports of dizziness with sidelying, dizziness with head motion and imbalance with static and dynamic balance activities.     Rehab Potential Good   Clinical Impairments Affecting Rehab Potential multisensory  balance impairments   PT Frequency 2x / week   PT Duration 4 weeks   PT Treatment/Interventions ADLs/Self Care Home Management;Therapeutic activities;Therapeutic exercise;Balance training;Gait training;Neuromuscular re-education;Functional mobility training;Patient/family education;Canalith Repostioning;Vestibular   PT Next Visit Plan Check 2 exercises from eval, add Otago components near countertop, steady state balance, gait with assist   Consulted and Agree with Plan of Care Patient;Family member/caregiver   Family Member Consulted daughter      Patient will benefit from skilled therapeutic intervention in order to improve the following deficits and impairments:  Abnormal gait, Decreased balance, Decreased strength, Impaired sensation, Postural dysfunction, Dizziness, Difficulty walking, Impaired vision/preception  Visit Diagnosis: Dizziness and giddiness  Other abnormalities of gait and mobility  Unsteadiness on feet      G-Codes - 13-Dec-2016 1628    Functional Assessment Tool Used (Outpatient Only) Berg=19/56   Functional Limitation Mobility: Walking and moving around   Mobility: Walking and Moving Around Current Status [phone]) At least 60 percent but less than 80 percent impaired, limited or restricted   Mobility: Walking and Moving Around Goal Status [phone]) At least 40 percent but less than 60 percent impaired, limited or restricted       Problem List Patient Active Problem List   Diagnosis Date Noted  . Need for prophylactic vaccination against Streptococcus pneumoniae (pneumococcus) 06/28/2016  . Dizziness 05/22/2016  . Venous stasis dermatitis of both lower extremities 02/28/2016  . History of cellulitis 04/05/2015  . Cellulitis of right lower extremity 11/24/2014  . Upper respiratory tract infection 10/05/2014  . Cholelithiasis 07/05/2014  . Anemia of chronic disease 06/15/2014  . Cholecystitis, acute 10/06/2013  . CKD (chronic kidney disease), stage III 10/06/2013   . Gout 04/21/2013  . Cellulitis 08/06/2012  . Vertigo 04/18/2012  . Back pain 01/30/2011  . Aortic valve disorder 01/07/2011  . Edema 10/15/2010  . SHINGLES 04/10/2010  . VITAMIN D DEFICIENCY 11/13/2008  . RENAL INSUFFICIENCY 11/13/2008  . GLAUCOMA 05/09/2008  . COLONIC POLYPS 11/09/2007  . Diverticulosis of large intestine 11/09/2007  . Hypothyroidism 07/08/2007  . HYPERCHOLESTEROLEMIA 07/08/2007  . Anxiety state 07/08/2007  . Essential hypertension 07/08/2007  . Venous (peripheral) insufficiency 07/08/2007  . ASTHMATIC BRONCHITIS, ACUTE 07/08/2007  . GERD 07/08/2007  . Osteoarthritis 07/08/2007  . PALPITATIONS, HX OF 07/08/2007    Nixie Laube, PT 12/13/16, 4:29 PM  [facility] [address] [place], [place], [address] Phone: [phone]   Fax:  [phone]  Name: [name] MRN: [id number] Date of Birth: 11/02/1914

## 2016-11-21 ENCOUNTER — Ambulatory Visit: Payer: Medicare Other | Attending: Neurology | Admitting: Physical Therapy

## 2016-11-21 ENCOUNTER — Encounter: Payer: Self-pay | Admitting: Physical Therapy

## 2016-11-21 DIAGNOSIS — R2681 Unsteadiness on feet: Secondary | ICD-10-CM | POA: Insufficient documentation

## 2016-11-21 DIAGNOSIS — R2689 Other abnormalities of gait and mobility: Secondary | ICD-10-CM | POA: Insufficient documentation

## 2016-11-21 DIAGNOSIS — R42 Dizziness and giddiness: Secondary | ICD-10-CM | POA: Insufficient documentation

## 2016-11-21 NOTE — Therapy (Signed)
[facility] [address] [place], [place], [address] Phone: [phone]   Fax:  [phone]  Physical Therapy Treatment  Patient Details  Name: [name] MRN: [id number] Date of Birth: 08/15/1915 Referring Provider: Metta Clines, DO  Encounter Date: 11/21/2016      PT End of Session - 11/21/16 1112    Visit Number 2   Number of Visits 8   Date for PT Re-Evaluation 12/15/16   Authorization Type G code every 10th visit   PT Start Time 1105   PT Stop Time 1145   PT Time Calculation (min) 40 min   Equipment Utilized During Treatment Gait belt   Activity Tolerance Patient tolerated treatment well   Behavior During Therapy [facility] for tasks assessed/performed      Past Medical History:  Diagnosis Date  . Acute bronchitis   . Anxiety state, unspecified   . Benign neoplasm of colon   . CKD (chronic kidney disease), stage III 10/06/2013  . Diverticulosis of colon (without mention of hemorrhage)   . DJD (degenerative joint disease)   . GERD (gastroesophageal reflux disease)   . Glaucoma   . Hypertension   . Hypothyroidism   . Palpitations   . Pure hypercholesterolemia   . Renal insufficiency   . Unspecified venous (peripheral) insufficiency   . Vitamin D deficiency disease     Past Surgical History:  Procedure Laterality Date  . ABDOMINAL HYSTERECTOMY    . BREAST BIOPSY     Benign  . CATARACT EXTRACTION    . ERCP N/A 10/06/2013   Procedure: ENDOSCOPIC RETROGRADE CHOLANGIOPANCREATOGRAPHY (ERCP);  Surgeon: Ladene Artist, MD;  Location: [place];  Service: Endoscopy;  Laterality: N/A;  . TOE AMPUTATION  08/2009   Right second toe - Dr Beola Cord  . TOTAL KNEE ARTHROPLASTY  1999   right  . TOTAL KNEE ARTHROPLASTY  11/2008   left - Dr Lorin Mercy    There were no vitals filed for this visit.      Subjective Assessment - 11/21/16 1110    Subjective No new complaints. No falls. Having right hip pain.    Patient  Stated Goals "I hope I can walk again and not have this feeling."   Currently in Pain? Yes   Pain Score 8    Pain Location Hip   Pain Orientation Right   Pain Descriptors / Indicators Aching;Dull;Discomfort   Pain Type Chronic pain   Pain Onset More than a month ago   Aggravating Factors  unknown   Pain Relieving Factors "nothing much"           Balance Exercises - 11/21/16 1114      OTAGO PROGRAM   Ankle Movements Sitting;10 reps   Knee Extensor 10 reps   Knee Flexor 10 reps  with counter top support   Hip ABductor 10 reps  with counter top support   Ankle Plantorflexors 20 reps, support   Ankle Dorsiflexors 20 reps, support   Knee Bends 10 reps, support   Backwards Walking Support  counter top support   Sideways Walking Assistive device  counter top support     reviewed 2 ex's issued at eval. Pt performing them without issues. Does still report having dizziness with no significant change in intensity/frequency.         PT Short Term Goals - 11/15/16 1609      PT SHORT TERM GOAL #1   Title STGs=LTGs  PT Long Term Goals - 11/15/16 1619      PT LONG TERM GOAL #1   Title The patient will return demo HEP with written cues for habituation for dizziness, and otago for balance/fall prevention.   Baseline Target date 12/14/2016   Time 4   Period Weeks     PT LONG TERM GOAL #2   Title The patient will improve Berg balance score from 19/56 to > or equal to 25/56 to demo improved steady state balance for ADLs.   Baseline Target date 12/14/2016   Time 4   Period Weeks     PT LONG TERM GOAL #3   Title The patient will improve gait speed from 1.24 ft/sec to > or equal to 1.7 ft/sec to demo improved functional mobility.   Baseline Target date 12/14/2016   Time 4   Period Weeks     PT LONG TERM GOAL #4   Title The patient will tolerate sit<>sidelying and seated head motion x 5 reps without c/o dizziness.   Baseline Target date 12/14/2016   Time 4    Period Weeks            Plan - 11/21/16 1112    Clinical Impression Statement Today's skilled session focused on advancing HEP with components of OTAGO. No issues reported with exercises issued today. Pt is making steady progress toward goals and should benefit from continued PT to progress toward unmet goals.    Rehab Potential Good   Clinical Impairments Affecting Rehab Potential multisensory balance impairments   PT Frequency 2x / week   PT Duration 4 weeks   PT Treatment/Interventions ADLs/Self Care Home Management;Therapeutic activities;Therapeutic exercise;Balance training;Gait training;Neuromuscular re-education;Functional mobility training;Patient/family education;Canalith Repostioning;Vestibular   PT Next Visit Plan complete Otago components near countertop, steady state balance, gait with assist   Consulted and Agree with Plan of Care Patient;Family member/caregiver   Family Member Consulted daughter      Patient will benefit from skilled therapeutic intervention in order to improve the following deficits and impairments:  Abnormal gait, Decreased balance, Decreased strength, Impaired sensation, Postural dysfunction, Dizziness, Difficulty walking, Impaired vision/preception  Visit Diagnosis: Dizziness and giddiness  Other abnormalities of gait and mobility  Unsteadiness on feet     Problem List Patient Active Problem List   Diagnosis Date Noted  . Need for prophylactic vaccination against Streptococcus pneumoniae (pneumococcus) 06/28/2016  . Dizziness 05/22/2016  . Venous stasis dermatitis of both lower extremities 02/28/2016  . History of cellulitis 04/05/2015  . Cellulitis of right lower extremity 11/24/2014  . Upper respiratory tract infection 10/05/2014  . Cholelithiasis 07/05/2014  . Anemia of chronic disease 06/15/2014  . Cholecystitis, acute 10/06/2013  . CKD (chronic kidney disease), stage III 10/06/2013  . Gout 04/21/2013  . Cellulitis 08/06/2012  .  Vertigo 04/18/2012  . Back pain 01/30/2011  . Aortic valve disorder 01/07/2011  . Edema 10/15/2010  . SHINGLES 04/10/2010  . VITAMIN D DEFICIENCY 11/13/2008  . RENAL INSUFFICIENCY 11/13/2008  . GLAUCOMA 05/09/2008  . COLONIC POLYPS 11/09/2007  . Diverticulosis of large intestine 11/09/2007  . Hypothyroidism 07/08/2007  . HYPERCHOLESTEROLEMIA 07/08/2007  . Anxiety state 07/08/2007  . Essential hypertension 07/08/2007  . Venous (peripheral) insufficiency 07/08/2007  . ASTHMATIC BRONCHITIS, ACUTE 07/08/2007  . GERD 07/08/2007  . Osteoarthritis 07/08/2007  . PALPITATIONS, HX OF 07/08/2007    [name], PTA, [facility] [address] [place], [place] [address] [phone] 11/21/16, 1:18 PM   Name: [name] MRN:  [id number] Date of Birth: 1915-04-07

## 2016-11-22 ENCOUNTER — Ambulatory Visit: Payer: Medicare Other | Admitting: Physical Therapy

## 2016-11-22 ENCOUNTER — Encounter: Payer: Self-pay | Admitting: Physical Therapy

## 2016-11-22 DIAGNOSIS — R2681 Unsteadiness on feet: Secondary | ICD-10-CM

## 2016-11-22 DIAGNOSIS — R42 Dizziness and giddiness: Secondary | ICD-10-CM

## 2016-11-22 DIAGNOSIS — R2689 Other abnormalities of gait and mobility: Secondary | ICD-10-CM | POA: Diagnosis not present

## 2016-11-22 NOTE — Patient Instructions (Addendum)
Gaze Stabilization: Sitting    Keeping eyes on target on_~10 feet away, tilt head down 15-30  1. move head side to side for _15__ seconds.  Repeat while moving head up and down for __15__ seconds. Perform each one 2-3 times. Do __1-2__ sessions per day.   Copyright  VHI. All rights reserved.   Gaze Stabilization: Tip Card  1.Target must remain in focus, not blurry, and appear stationary while head is in motion. 2.Perform exercises with small head movements (45 to either side of midline). 3.Increase speed of head motion so long as target is in focus. 4.If you wear eyeglasses, be sure you can see target through lens (therapist will give specific instructions for bifocal / progressive lenses). 5.These exercises may provoke dizziness or nausea. Work through these symptoms. If too dizzy, slow head movement slightly. Rest between each exercise. 6.Exercises demand concentration; avoid distractions. 7.For safety, perform standing exercises close to a counter, wall, corner, or next to someone.  Copyright  VHI. All rights reserved.

## 2016-11-24 NOTE — Therapy (Signed)
[facility] [address] [place], [place], [address] Phone: [phone]   Fax:  [phone]  Physical Therapy Treatment  Patient Details  Name: [name] MRN: [id number] Date of Birth: July 30, 1915 Referring Provider: Metta Clines, DO  Encounter Date: 11/22/2016     11/22/16 1113  PT Visits / Re-Eval  Visit Number 3  Number of Visits 8  Date for PT Re-Evaluation 12/15/16  Authorization  Authorization Type G code every 10th visit  PT Time Calculation  PT Start Time 1106  PT Stop Time 1147  PT Time Calculation (min) 41 min  PT - End of Session  Equipment Utilized During Treatment Gait belt  Activity Tolerance Patient tolerated treatment well  Behavior During Therapy [facility] for tasks assessed/performed    Past Medical History:  Diagnosis Date  . Acute bronchitis   . Anxiety state, unspecified   . Benign neoplasm of colon   . CKD (chronic kidney disease), stage III 10/06/2013  . Diverticulosis of colon (without mention of hemorrhage)   . DJD (degenerative joint disease)   . GERD (gastroesophageal reflux disease)   . Glaucoma   . Hypertension   . Hypothyroidism   . Palpitations   . Pure hypercholesterolemia   . Renal insufficiency   . Unspecified venous (peripheral) insufficiency   . Vitamin D deficiency disease     Past Surgical History:  Procedure Laterality Date  . ABDOMINAL HYSTERECTOMY    . BREAST BIOPSY     Benign  . CATARACT EXTRACTION    . ERCP N/A 10/06/2013   Procedure: ENDOSCOPIC RETROGRADE CHOLANGIOPANCREATOGRAPHY (ERCP);  Surgeon: Ladene Artist, MD;  Location: [place];  Service: Endoscopy;  Laterality: N/A;  . TOE AMPUTATION  08/2009   Right second toe - Dr Beola Cord  . TOTAL KNEE ARTHROPLASTY  1999   right  . TOTAL KNEE ARTHROPLASTY  11/2008   left - Dr Lorin Mercy    There were no vitals filed for this visit.     11/22/16 1112  Symptoms/Limitations  Subjective No new complaints. No  changes in dizziness. No falls to report.   Patient is accompained by: Family member (daughter)  Patient Stated Goals "I hope I can walk again and not have this feeling."  Pain Assessment  Currently in Pain? Yes  Pain Score 5  Pain Location Hip  Pain Orientation Right  Pain Descriptors / Indicators Aching;Sore;Discomfort  Pain Type Chronic pain  Pain Onset More than a month ago  Pain Frequency Intermittent  Aggravating Factors  unknown  Pain Relieving Factors "nothing much"      11/22/16 1122  Orthostatics  BP supine (x 5 minutes) 163/68  HR supine (x 5 minutes) 75  BP standing (after 1 minute) 160/62  HR standing (after 1 minute) 78  BP standing (after 3 minutes) 162/67  HR standing (after 3 minutes) 79  Orthostatics Comment no dizziness or imbalance noted   issued X1 seated exercises for HEP after practice in session. Performed head movements left<>right and up<>down x 15 sec's each for 3 reps each. Increased symptoms with left<>right only that decreased in intensity over the course of the 3 reps.      11/22/16 1140  PT Education  Education provided Yes  Education Details results of orthostatic hypotension testing; added seated x1 viewing ex's with large target (solid black squard on white background)  Person(s) Educated Patient;Child(ren)  Methods Explanation;Demonstration;Handout  Comprehension Verbalized understanding;Returned demonstration;Verbal cues required         PT  Short Term Goals - 11/15/16 1609      PT SHORT TERM GOAL #1   Title STGs=LTGs           PT Long Term Goals - 11/15/16 1619      PT LONG TERM GOAL #1   Title The patient will return demo HEP with written cues for habituation for dizziness, and otago for balance/fall prevention.   Baseline Target date 12/14/2016   Time 4   Period Weeks     PT LONG TERM GOAL #2   Title The patient will improve Berg balance score from 19/56 to > or equal to 25/56 to demo improved steady state balance for  ADLs.   Baseline Target date 12/14/2016   Time 4   Period Weeks     PT LONG TERM GOAL #3   Title The patient will improve gait speed from 1.24 ft/sec to > or equal to 1.7 ft/sec to demo improved functional mobility.   Baseline Target date 12/14/2016   Time 4   Period Weeks     PT LONG TERM GOAL #4   Title The patient will tolerate sit<>sidelying and seated head motion x 5 reps without c/o dizziness.   Baseline Target date 12/14/2016   Time 4   Period Weeks        11/22/16 1114  Plan  Clinical Impression Statement During today's skilled session pt was tested for orthostatic hypotesion with negative results due to reports of dizziness mostly occuring with standing up. Remainder of session addressed issuing x1 seated exercises with target that pt is able to visualize. Pt is making steady progress toward goals and should benefit from continued PT to progress toward unmet goals.   Pt will benefit from skilled therapeutic intervention in order to improve on the following deficits Abnormal gait;Decreased balance;Decreased strength;Impaired sensation;Postural dysfunction;Dizziness;Difficulty walking;Impaired vision/preception  Rehab Potential Good  Clinical Impairments Affecting Rehab Potential multisensory balance impairments  PT Frequency 2x / week  PT Duration 4 weeks  PT Treatment/Interventions ADLs/Self Care Home Management;Therapeutic activities;Therapeutic exercise;Balance training;Gait training;Neuromuscular re-education;Functional mobility training;Patient/family education;Canalith Repostioning;Vestibular  PT Next Visit Plan complete Otago components near countertop, steady state balance, gait with assist  Consulted and Agree with Plan of Care Patient;Family member/caregiver  Family Member Consulted daughter     Patient will benefit from skilled therapeutic intervention in order to improve the following deficits and impairments:  Abnormal gait, Decreased balance, Decreased strength,  Impaired sensation, Postural dysfunction, Dizziness, Difficulty walking, Impaired vision/preception  Visit Diagnosis: Dizziness and giddiness  Unsteadiness on feet     Problem List Patient Active Problem List   Diagnosis Date Noted  . Need for prophylactic vaccination against Streptococcus pneumoniae (pneumococcus) 06/28/2016  . Dizziness 05/22/2016  . Venous stasis dermatitis of both lower extremities 02/28/2016  . History of cellulitis 04/05/2015  . Cellulitis of right lower extremity 11/24/2014  . Upper respiratory tract infection 10/05/2014  . Cholelithiasis 07/05/2014  . Anemia of chronic disease 06/15/2014  . Cholecystitis, acute 10/06/2013  . CKD (chronic kidney disease), stage III 10/06/2013  . Gout 04/21/2013  . Cellulitis 08/06/2012  . Vertigo 04/18/2012  . Back pain 01/30/2011  . Aortic valve disorder 01/07/2011  . Edema 10/15/2010  . SHINGLES 04/10/2010  . VITAMIN D DEFICIENCY 11/13/2008  . RENAL INSUFFICIENCY 11/13/2008  . GLAUCOMA 05/09/2008  . COLONIC POLYPS 11/09/2007  . Diverticulosis of large intestine 11/09/2007  . Hypothyroidism 07/08/2007  . HYPERCHOLESTEROLEMIA 07/08/2007  . Anxiety state 07/08/2007  . Essential hypertension 07/08/2007  . Venous (  peripheral) insufficiency 07/08/2007  . ASTHMATIC BRONCHITIS, ACUTE 07/08/2007  . GERD 07/08/2007  . Osteoarthritis 07/08/2007  . PALPITATIONS, HX OF 07/08/2007    Willow Ora, PTA, [facility] [address] [place], [place] [address] [phone] 11/24/16, 2:48 PM   Name: BREN FAZIO MRN: [id number] Date of Birth: 03-May-1915

## 2016-11-25 DIAGNOSIS — H18421 Band keratopathy, right eye: Secondary | ICD-10-CM | POA: Diagnosis not present

## 2016-11-26 ENCOUNTER — Ambulatory Visit: Payer: Medicare Other | Admitting: Physical Therapy

## 2016-11-26 ENCOUNTER — Encounter: Payer: Self-pay | Admitting: Physical Therapy

## 2016-11-26 DIAGNOSIS — R42 Dizziness and giddiness: Secondary | ICD-10-CM

## 2016-11-26 DIAGNOSIS — R2689 Other abnormalities of gait and mobility: Secondary | ICD-10-CM | POA: Diagnosis not present

## 2016-11-26 DIAGNOSIS — R2681 Unsteadiness on feet: Secondary | ICD-10-CM | POA: Diagnosis not present

## 2016-11-27 NOTE — Therapy (Signed)
[facility] [address] [place], [place], [address] Phone: [phone]   Fax:  [phone]  Physical Therapy Treatment  Patient Details  Name: [name] MRN: [id number] Date of Birth: Apr 24, 1915 Referring Provider: Metta Clines, DO  Encounter Date: 11/26/2016      PT End of Session - 11/26/16 1241    Visit Number 4   Number of Visits 8   Date for PT Re-Evaluation 12/15/16   Authorization Type G code every 10th visit   PT Start Time 1233   PT Stop Time 1314   PT Time Calculation (min) 41 min   Equipment Utilized During Treatment Gait belt   Activity Tolerance Patient tolerated treatment well   Behavior During Therapy [facility] for tasks assessed/performed      Past Medical History:  Diagnosis Date  . Acute bronchitis   . Anxiety state, unspecified   . Benign neoplasm of colon   . CKD (chronic kidney disease), stage III 10/06/2013  . Diverticulosis of colon (without mention of hemorrhage)   . DJD (degenerative joint disease)   . GERD (gastroesophageal reflux disease)   . Glaucoma   . Hypertension   . Hypothyroidism   . Palpitations   . Pure hypercholesterolemia   . Renal insufficiency   . Unspecified venous (peripheral) insufficiency   . Vitamin D deficiency disease     Past Surgical History:  Procedure Laterality Date  . ABDOMINAL HYSTERECTOMY    . BREAST BIOPSY     Benign  . CATARACT EXTRACTION    . ERCP N/A 10/06/2013   Procedure: ENDOSCOPIC RETROGRADE CHOLANGIOPANCREATOGRAPHY (ERCP);  Surgeon: Ladene Artist, MD;  Location: [facility];  Service: Endoscopy;  Laterality: N/A;  . TOE AMPUTATION  08/2009   Right second toe - Dr Beola Cord  . TOTAL KNEE ARTHROPLASTY  1999   right  . TOTAL KNEE ARTHROPLASTY  11/2008   left - Dr Lorin Mercy    There were no vitals filed for this visit.      Subjective Assessment - 11/26/16 1237    Subjective No new complaints. No falls Had contact removed from right eye  yesterday by the eye doctor (has had it in for about a month)   Patient is accompained by: Family member  daughter   Patient Stated Goals "I hope I can walk again and not have this feeling."   Currently in Pain? Yes   Pain Score 8    Pain Location Eye   Pain Orientation Right   Pain Descriptors / Indicators Sore;Discomfort   Pain Type Acute pain   Pain Onset Yesterday   Pain Frequency Occasional   Aggravating Factors  removal of contact yesterday   Pain Relieving Factors warm compress            Balance Exercises - 11/26/16 1242      OTAGO PROGRAM   Ankle Movements Sitting;10 reps   Knee Extensor 10 reps   Knee Flexor 10 reps  counter top support   Hip ABductor 10 reps  counter top support   Ankle Plantorflexors 20 reps, support   Ankle Dorsiflexors 20 reps, support   Knee Bends 10 reps, support   Backwards Walking Support  counter top and cane   Sideways Walking Assistive device  counter top   Tandem Stance 10 seconds, support  counter top support   Tandem Walk --  pt too unsteady- did not send home   One Leg Stand 10 seconds, support  counter top support  Toe Walk --  too unsteady- did not send home   Sit to Stand 10 reps, bilateral support   Overall OTAGO Comments verbal/visual/tactile cues needed for ex form and technique.              PT Short Term Goals - 11/15/16 1609      PT SHORT TERM GOAL #1   Title STGs=LTGs           PT Long Term Goals - 11/15/16 1619      PT LONG TERM GOAL #1   Title The patient will return demo HEP with written cues for habituation for dizziness, and otago for balance/fall prevention.   Baseline Target date 12/14/2016   Time 4   Period Weeks     PT LONG TERM GOAL #2   Title The patient will improve Berg balance score from 19/56 to > or equal to 25/56 to demo improved steady state balance for ADLs.   Baseline Target date 12/14/2016   Time 4   Period Weeks     PT LONG TERM GOAL #3   Title The patient will  improve gait speed from 1.24 ft/sec to > or equal to 1.7 ft/sec to demo improved functional mobility.   Baseline Target date 12/14/2016   Time 4   Period Weeks     PT LONG TERM GOAL #4   Title The patient will tolerate sit<>sidelying and seated head motion x 5 reps without c/o dizziness.   Baseline Target date 12/14/2016   Time 4   Period Weeks               Plan - 11/26/16 1241    Clinical Impression Statement Today's skilled session    Rehab Potential Good   Clinical Impairments Affecting Rehab Potential multisensory balance impairments   PT Frequency 2x / week   PT Duration 4 weeks   PT Treatment/Interventions ADLs/Self Care Home Management;Therapeutic activities;Therapeutic exercise;Balance training;Gait training;Neuromuscular re-education;Functional mobility training;Patient/family education;Canalith Repostioning;Vestibular   PT Next Visit Plan ensure newly added OTAGO exercises are going well; steady state balance, gait with assist   Consulted and Agree with Plan of Care Patient;Family member/caregiver   Family Member Consulted daughter      Patient will benefit from skilled therapeutic intervention in order to improve the following deficits and impairments:  Abnormal gait, Decreased balance, Decreased strength, Impaired sensation, Postural dysfunction, Dizziness, Difficulty walking, Impaired vision/preception  Visit Diagnosis: Dizziness and giddiness  Unsteadiness on feet  Other abnormalities of gait and mobility     Problem List Patient Active Problem List   Diagnosis Date Noted  . Need for prophylactic vaccination against Streptococcus pneumoniae (pneumococcus) 06/28/2016  . Dizziness 05/22/2016  . Venous stasis dermatitis of both lower extremities 02/28/2016  . History of cellulitis 04/05/2015  . Cellulitis of right lower extremity 11/24/2014  . Upper respiratory tract infection 10/05/2014  . Cholelithiasis 07/05/2014  . Anemia of chronic disease  06/15/2014  . Cholecystitis, acute 10/06/2013  . CKD (chronic kidney disease), stage III 10/06/2013  . Gout 04/21/2013  . Cellulitis 08/06/2012  . Vertigo 04/18/2012  . Back pain 01/30/2011  . Aortic valve disorder 01/07/2011  . Edema 10/15/2010  . SHINGLES 04/10/2010  . VITAMIN D DEFICIENCY 11/13/2008  . RENAL INSUFFICIENCY 11/13/2008  . GLAUCOMA 05/09/2008  . COLONIC POLYPS 11/09/2007  . Diverticulosis of large intestine 11/09/2007  . Hypothyroidism 07/08/2007  . HYPERCHOLESTEROLEMIA 07/08/2007  . Anxiety state 07/08/2007  . Essential hypertension 07/08/2007  . Venous (peripheral) insufficiency 07/08/2007  .  ASTHMATIC BRONCHITIS, ACUTE 07/08/2007  . GERD 07/08/2007  . Osteoarthritis 07/08/2007  . PALPITATIONS, HX OF 07/08/2007    Willow Ora, PTA, [facility] [address] [place], [place] [address] [phone] 11/27/16, 10:25 AM   Name: [name] MRN: [id number] Date of Birth: [birth year]

## 2016-11-29 ENCOUNTER — Ambulatory Visit: Payer: Medicare Other | Admitting: Rehabilitative and Restorative Service Providers"

## 2016-11-29 DIAGNOSIS — R2681 Unsteadiness on feet: Secondary | ICD-10-CM | POA: Diagnosis not present

## 2016-11-29 DIAGNOSIS — R2689 Other abnormalities of gait and mobility: Secondary | ICD-10-CM | POA: Diagnosis not present

## 2016-11-29 DIAGNOSIS — R42 Dizziness and giddiness: Secondary | ICD-10-CM | POA: Diagnosis not present

## 2016-11-29 NOTE — Therapy (Signed)
[facility] [address] [place], [place], [address] Phone: [phone]   Fax:  [phone]  Physical Therapy Treatment  Patient Details  Name: [name] MRN: [id number] Date of Birth: 23-Oct-1914 Referring Provider: Metta Clines, DO  Encounter Date: 11/29/2016      PT End of Session - 11/29/16 1314    Visit Number 5   Number of Visits 8   Date for PT Re-Evaluation 12/15/16   Authorization Type G code every 10th visit   PT Start Time 1237   PT Stop Time 1317   PT Time Calculation (min) 40 min   Equipment Utilized During Treatment Gait belt   Activity Tolerance Patient tolerated treatment well   Behavior During Therapy [facility] for tasks assessed/performed      Past Medical History:  Diagnosis Date  . Acute bronchitis   . Anxiety state, unspecified   . Benign neoplasm of colon   . CKD (chronic kidney disease), stage III 10/06/2013  . Diverticulosis of colon (without mention of hemorrhage)   . DJD (degenerative joint disease)   . GERD (gastroesophageal reflux disease)   . Glaucoma   . Hypertension   . Hypothyroidism   . Palpitations   . Pure hypercholesterolemia   . Renal insufficiency   . Unspecified venous (peripheral) insufficiency   . Vitamin D deficiency disease     Past Surgical History:  Procedure Laterality Date  . ABDOMINAL HYSTERECTOMY    . BREAST BIOPSY     Benign  . CATARACT EXTRACTION    . ERCP N/A 10/06/2013   Procedure: ENDOSCOPIC RETROGRADE CHOLANGIOPANCREATOGRAPHY (ERCP);  Surgeon: Ladene Artist, MD;  Location: [facility];  Service: Endoscopy;  Laterality: N/A;  . TOE AMPUTATION  08/2009   Right second toe - Dr Beola Cord  . TOTAL KNEE ARTHROPLASTY  1999   right  . TOTAL KNEE ARTHROPLASTY  11/2008   left - Dr Lorin Mercy    There were no vitals filed for this visit.      Subjective Assessment - 11/29/16 1238    Subjective The patient notes that the exercises create some "pulling" of  muscles.  She still feels dizzy when on her feet.   Patient is accompained by: Family member  daughter   Patient Stated Goals "I hope I can walk again and not have this feeling."   Currently in Pain? Yes   Pain Score 8    Pain Location Eye   Pain Orientation Right   Pain Descriptors / Indicators Discomfort   Pain Type Acute pain   Pain Onset More than a month ago   Pain Frequency Intermittent   Aggravating Factors  unsure                         [facility] Adult PT Treatment/Exercise - 11/29/16 1240      Ambulation/Gait   Ambulation/Gait Yes   Ambulation/Gait Assistance 5: Supervision;4: Min guard   Ambulation/Gait Assistance Details with cues for longer stride length and to use visual spotting for cues.   Ambulation Distance (Feet) 230 Feet  x 3 reps   Assistive device Straight cane   Ambulation Surface Level   Gait Comments Patient's stride length increases with comfort in the clinic.  Emphasized walking with device on level surfaces to increase activity.     Neuro Re-ed    Neuro Re-ed Details  Wall bumps x 10 reps with SBA near chair for support; with eyes closed x  5 reps.  Steady state balance with alternating UE reaching.   Foam standing with alternating UE reaching, head motion with CGA.  Sidestepping x 10 ft to each side R<>L with dec'ing UE support.  Standing postural cues for upright.  Turning in place and figure 8 turns with min A for safety and cues to use visual spotting for stabilization.  Stride position with anterior/posterior rocking for weight shifting and standing lateral weight shifting.                   PT Short Term Goals - 11/15/16 1609      PT SHORT TERM GOAL #1   Title STGs=LTGs           PT Long Term Goals - 11/15/16 1619      PT LONG TERM GOAL #1   Title The patient will return demo HEP with written cues for habituation for dizziness, and otago for balance/fall prevention.   Baseline Target date 12/14/2016   Time 4    Period Weeks     PT LONG TERM GOAL #2   Title The patient will improve Berg balance score from 19/56 to > or equal to 25/56 to demo improved steady state balance for ADLs.   Baseline Target date 12/14/2016   Time 4   Period Weeks     PT LONG TERM GOAL #3   Title The patient will improve gait speed from 1.24 ft/sec to > or equal to 1.7 ft/sec to demo improved functional mobility.   Baseline Target date 12/14/2016   Time 4   Period Weeks     PT LONG TERM GOAL #4   Title The patient will tolerate sit<>sidelying and seated head motion x 5 reps without c/o dizziness.   Baseline Target date 12/14/2016   Time 4   Period Weeks               Plan - 11/29/16 1540    Clinical Impression Statement PT emphasizing motor control of balance working on limits of stability, steady state and proactive balance activities.    PT Treatment/Interventions ADLs/Self Care Home Management;Therapeutic activities;Therapeutic exercise;Balance training;Gait training;Neuromuscular re-education;Functional mobility training;Patient/family education;Canalith Repostioning;Vestibular   PT Next Visit Plan Check HEP as needed, wall bumps/ motor control anterior/posterior weight shifting, lateral weight shift, turns in standing, compliant surfaces working on postural control.   Consulted and Agree with Plan of Care Patient      Patient will benefit from skilled therapeutic intervention in order to improve the following deficits and impairments:  Abnormal gait, Decreased balance, Decreased strength, Impaired sensation, Postural dysfunction, Dizziness, Difficulty walking, Impaired vision/preception  Visit Diagnosis: Dizziness and giddiness  Unsteadiness on feet  Other abnormalities of gait and mobility     Problem List Patient Active Problem List   Diagnosis Date Noted  . Need for prophylactic vaccination against Streptococcus pneumoniae (pneumococcus) 06/28/2016  . Dizziness 05/22/2016  . Venous stasis  dermatitis of both lower extremities 02/28/2016  . History of cellulitis 04/05/2015  . Cellulitis of right lower extremity 11/24/2014  . Upper respiratory tract infection 10/05/2014  . Cholelithiasis 07/05/2014  . Anemia of chronic disease 06/15/2014  . Cholecystitis, acute 10/06/2013  . CKD (chronic kidney disease), stage III 10/06/2013  . Gout 04/21/2013  . Cellulitis 08/06/2012  . Vertigo 04/18/2012  . Back pain 01/30/2011  . Aortic valve disorder 01/07/2011  . Edema 10/15/2010  . SHINGLES 04/10/2010  . VITAMIN D DEFICIENCY 11/13/2008  . RENAL INSUFFICIENCY 11/13/2008  . GLAUCOMA 05/09/2008  .  COLONIC POLYPS 11/09/2007  . Diverticulosis of large intestine 11/09/2007  . Hypothyroidism 07/08/2007  . HYPERCHOLESTEROLEMIA 07/08/2007  . Anxiety state 07/08/2007  . Essential hypertension 07/08/2007  . Venous (peripheral) insufficiency 07/08/2007  . ASTHMATIC BRONCHITIS, ACUTE 07/08/2007  . GERD 07/08/2007  . Osteoarthritis 07/08/2007  . PALPITATIONS, HX OF 07/08/2007    Jersey Espinoza, PT 11/29/2016, 4:27 PM  [facility] [address] [place], [place], [address] Phone: [phone]   Fax:  [phone]  Name: [name] MRN: [id number] Date of Birth: 1915/04/21

## 2016-12-01 ENCOUNTER — Other Ambulatory Visit: Payer: Self-pay | Admitting: Pulmonary Disease

## 2016-12-02 ENCOUNTER — Ambulatory Visit: Payer: Medicare Other | Admitting: Physical Therapy

## 2016-12-06 ENCOUNTER — Ambulatory Visit: Payer: Medicare Other | Admitting: Rehabilitative and Restorative Service Providers"

## 2016-12-06 DIAGNOSIS — R42 Dizziness and giddiness: Secondary | ICD-10-CM | POA: Diagnosis not present

## 2016-12-06 DIAGNOSIS — R2681 Unsteadiness on feet: Secondary | ICD-10-CM | POA: Diagnosis not present

## 2016-12-06 DIAGNOSIS — R2689 Other abnormalities of gait and mobility: Secondary | ICD-10-CM | POA: Diagnosis not present

## 2016-12-06 NOTE — Therapy (Signed)
[facility] [address] [place], [place], [address] Phone: [phone]   Fax:  [phone]  Physical Therapy Treatment  Patient Details  Name: [name] MRN: [id number] Date of Birth: 06-30-1915 Referring Provider: Metta Clines, DO  Encounter Date: 12/06/2016      PT End of Session - 12/06/16 1331    Visit Number 6   Number of Visits 8   Date for PT Re-Evaluation 12/15/16   Authorization Type G code every 10th visit   PT Start Time 1325   PT Stop Time 1405   PT Time Calculation (min) 40 min   Equipment Utilized During Treatment Gait belt   Activity Tolerance Patient tolerated treatment well   Behavior During Therapy [facility] for tasks assessed/performed      Past Medical History:  Diagnosis Date  . Acute bronchitis   . Anxiety state, unspecified   . Benign neoplasm of colon   . CKD (chronic kidney disease), stage III 10/06/2013  . Diverticulosis of colon (without mention of hemorrhage)   . DJD (degenerative joint disease)   . GERD (gastroesophageal reflux disease)   . Glaucoma   . Hypertension   . Hypothyroidism   . Palpitations   . Pure hypercholesterolemia   . Renal insufficiency   . Unspecified venous (peripheral) insufficiency   . Vitamin D deficiency disease     Past Surgical History:  Procedure Laterality Date  . ABDOMINAL HYSTERECTOMY    . BREAST BIOPSY     Benign  . CATARACT EXTRACTION    . ERCP N/A 10/06/2013   Procedure: ENDOSCOPIC RETROGRADE CHOLANGIOPANCREATOGRAPHY (ERCP);  Surgeon: Ladene Artist, MD;  Location: [facility];  Service: Endoscopy;  Laterality: N/A;  . TOE AMPUTATION  08/2009   Right second toe - Dr Beola Cord  . TOTAL KNEE ARTHROPLASTY  1999   right  . TOTAL KNEE ARTHROPLASTY  11/2008   left - Dr Lorin Mercy    There were no vitals filed for this visit.      Subjective Assessment - 12/06/16 1330    Subjective The patient reports that her symptoms are "more in my eyes" than  what she has experienced in the past with dizziness.  This isn't like regular vertigo.  The patient notes that she feels her sympotms began when she changed glasses/lenses.  She does not notice an improvement with therapy exercises.    Patient is accompained by: Family member  daughter   Patient Stated Goals "I hope I can walk again and not have this feeling."   Currently in Pain? Yes  eyes--see prior pain assessment as PT not addressing.            [facility] PT Assessment - 12/06/16 1339      Ambulation/Gait   Ambulation/Gait Yes   Gait velocity 1.50 ft/sec     Standardized Balance Assessment   Standardized Balance Assessment Berg Balance Test     Berg Balance Test   Sit to Stand Able to stand  independently using hands   Standing Unsupported Able to stand 2 minutes with supervision   Sitting with Back Unsupported but Feet Supported on Floor or Stool Able to sit safely and securely 2 minutes   Stand to Sit Controls descent by using hands   Transfers Able to transfer safely, definite need of hands   Standing Unsupported with Eyes Closed Able to stand 10 seconds with supervision   Standing Ubsupported with Feet Together Needs help to attain position but able to stand  for 30 seconds with feet together   From Standing, Reach Forward with Outstretched Arm Loses balance while trying/requires external support   From Standing Position, Pick up Object from [place] to pick up shoe, needs supervision   From Standing Position, Turn to Look Behind Over each Shoulder Needs supervision when turning   Turn 360 Degrees Needs assistance while turning   Standing Unsupported, Alternately Place Feet on Step/Stool Needs assistance to keep from falling or unable to try   Standing Unsupported, One Foot in [place] to take small step independently and hold 30 seconds   Standing on One Leg Unable to try or needs assist to prevent fall   Total Score 26   Berg comment: 26/56 demonstrating improvement  since evaluation.            Vestibular Assessment - 12/06/16 1349      Sidelying Left   Sidelying Left Duration none   Sidelying Left Symptoms No nystagmus                 [facility] Adult PT Treatment/Exercise - 12/06/16 1339      Ambulation/Gait   Ambulation/Gait Assistance 5: Supervision   Ambulation/Gait Assistance Details with cues for L UE to neutral (versus flexed to guard for loss of balance), cues on visual spotting.   Ambulation Distance (Feet) 230 Feet  x 2 reps   Assistive device Straight cane   Ambulation Surface Level   Gait Comments Gait working on increased stride length.     Self-Care   Self-Care Other Self-Care Comments   Other Self-Care Comments  Discussed modifying PT plan due to patient noting no change with exercises.  She feels her symptoms are "more visual" than prior episodes of dizziness/vertigo.  PT encouraged further check-up--she is supposed to be seeing new eye doctor soon.     Neuro Re-ed    Neuro Re-ed Details  Wall bumps x 10 reps with SBA, standing moving R and L LEs into/out of stride without UE support near support surface with SBA for safety.                 PT Education - 12/06/16 1435    Education provided Yes   Education Details recommended continued work on HEP for 2 weeks and then f/u with PT to allow time to schedule with eye MD as patient feels continued imbalance; educated patient on multi-sensory nature of impairments.   Person(s) Educated Patient   Methods Explanation   Comprehension Verbalized understanding          PT Short Term Goals - 11/15/16 1609      PT SHORT TERM GOAL #1   Title STGs=LTGs           PT Long Term Goals - 12/06/16 1334      PT LONG TERM GOAL #1   Title The patient will return demo HEP with written cues for habituation for dizziness, and otago for balance/fall prevention.   Baseline Target date 12/14/2016   Time 4   Period Weeks   Status On-going     PT LONG TERM GOAL #2    Title The patient will improve Berg balance score from 19/56 to > or equal to 25/56 to demo improved steady state balance for ADLs.   Baseline Scores 26/56 on 12/06/16   Time 4   Period Weeks   Status Achieved     PT LONG TERM GOAL #3   Title The patient will improve gait speed from  1.24 ft/sec to > or equal to 1.7 ft/sec to demo improved functional mobility.   Baseline Improved to 1.5 ft/sec.    Time 4   Period Weeks   Status Partially Met     PT LONG TERM GOAL #4   Title The patient will tolerate sit<>sidelying and seated head motion x 5 reps without c/o dizziness.   Baseline Met on 12/06/16.   Time 4   Period Weeks   Status Achieved               Plan - 12/06/16 1440    Clinical Impression Statement The patient met 2 LTGs, made minimal progress on gait speed and is continuing to work on [employer].  Although patient demo'ing some improvement with objective measures, she notes no subjective improvements.  PT to have her continue working on HEP (stressed importance of maintaining strength/mobility) and f/u with eye doctor for next visit to discuss visual issues that she feels are limiting mobility.   PT Treatment/Interventions ADLs/Self Care Home Management;Therapeutic activities;Therapeutic exercise;Balance training;Gait training;Neuromuscular re-education;Functional mobility training;Patient/family education;Canalith Repostioning;Vestibular   PT Next Visit Plan Have patient demo [place] as she is performing in home.  Wall bumps, turns in standing, using remaining sensory input for balance/compensatory strategies.   Consulted and Agree with Plan of Care Patient      Patient will benefit from skilled therapeutic intervention in order to improve the following deficits and impairments:  Abnormal gait, Decreased balance, Decreased strength, Impaired sensation, Postural dysfunction, Dizziness, Difficulty walking, Impaired vision/preception  Visit Diagnosis: Dizziness and  giddiness  Unsteadiness on feet  Other abnormalities of gait and mobility     Problem List Patient Active Problem List   Diagnosis Date Noted  . Need for prophylactic vaccination against Streptococcus pneumoniae (pneumococcus) 06/28/2016  . Dizziness 05/22/2016  . Venous stasis dermatitis of both lower extremities 02/28/2016  . History of cellulitis 04/05/2015  . Cellulitis of right lower extremity 11/24/2014  . Upper respiratory tract infection 10/05/2014  . Cholelithiasis 07/05/2014  . Anemia of chronic disease 06/15/2014  . Cholecystitis, acute 10/06/2013  . CKD (chronic kidney disease), stage III 10/06/2013  . Gout 04/21/2013  . Cellulitis 08/06/2012  . Vertigo 04/18/2012  . Back pain 01/30/2011  . Aortic valve disorder 01/07/2011  . Edema 10/15/2010  . SHINGLES 04/10/2010  . VITAMIN D DEFICIENCY 11/13/2008  . RENAL INSUFFICIENCY 11/13/2008  . GLAUCOMA 05/09/2008  . COLONIC POLYPS 11/09/2007  . Diverticulosis of large intestine 11/09/2007  . Hypothyroidism 07/08/2007  . HYPERCHOLESTEROLEMIA 07/08/2007  . Anxiety state 07/08/2007  . Essential hypertension 07/08/2007  . Venous (peripheral) insufficiency 07/08/2007  . ASTHMATIC BRONCHITIS, ACUTE 07/08/2007  . GERD 07/08/2007  . Osteoarthritis 07/08/2007  . PALPITATIONS, HX OF 07/08/2007    Mery Guadalupe, PT 12/06/2016, 2:44 PM  [facility] [address] [place], [place], [address] Phone: [phone]   Fax:  [phone]  Name: [name] MRN: [id number] Date of Birth: 1915-03-07

## 2016-12-09 ENCOUNTER — Ambulatory Visit: Payer: Medicare Other | Admitting: Rehabilitative and Restorative Service Providers"

## 2016-12-13 ENCOUNTER — Ambulatory Visit: Payer: Medicare Other | Admitting: Rehabilitative and Restorative Service Providers"

## 2016-12-25 ENCOUNTER — Ambulatory Visit (INDEPENDENT_AMBULATORY_CARE_PROVIDER_SITE_OTHER): Payer: Medicare Other | Admitting: Family

## 2016-12-25 ENCOUNTER — Ambulatory Visit (INDEPENDENT_AMBULATORY_CARE_PROVIDER_SITE_OTHER): Payer: Medicare Other

## 2016-12-25 DIAGNOSIS — M25511 Pain in right shoulder: Secondary | ICD-10-CM | POA: Diagnosis not present

## 2016-12-25 DIAGNOSIS — Z961 Presence of intraocular lens: Secondary | ICD-10-CM | POA: Diagnosis not present

## 2016-12-25 DIAGNOSIS — H17821 Peripheral opacity of cornea, right eye: Secondary | ICD-10-CM | POA: Diagnosis not present

## 2016-12-25 DIAGNOSIS — Z947 Corneal transplant status: Secondary | ICD-10-CM | POA: Diagnosis not present

## 2016-12-25 DIAGNOSIS — H401131 Primary open-angle glaucoma, bilateral, mild stage: Secondary | ICD-10-CM | POA: Diagnosis not present

## 2016-12-25 DIAGNOSIS — B0052 Herpesviral keratitis: Secondary | ICD-10-CM | POA: Diagnosis not present

## 2016-12-25 DIAGNOSIS — H35351 Cystoid macular degeneration, right eye: Secondary | ICD-10-CM | POA: Diagnosis not present

## 2016-12-25 NOTE — Progress Notes (Signed)
Office Visit Note   Patient: [name]           Date of Birth: 04-28-1915           MRN: [id number] Visit Date: 12/25/2016              Requested by: Noralee Space, MD [place], [place] [address] PCP: Noralee Space, MD  No chief complaint on file.     HPI: The patient is a 83 year old woman seen today for evaluation of right shoulder pain. This has been ongoing just over a week. Complaining of pain in anterior and posterior holder. This radiates down her upper arm. Difficulty with abduction. Pain when attempting above head reaching. Does state that over the past week this has begun to slowly get a little bit better. States that about a week ago she was boxing a ball of her winter close and attempting to put them up in her closet. Had immediate onset of shoulder pain and loss of range of motion including abduction and forward flexion.  No pop. No swelling. No ecchymosis. Assessment & Plan: Visit Diagnoses:  1. Acute pain of right shoulder     Plan: Injection today. Follow up in office in 4 weeks if continued pain.   Follow-Up Instructions: Return in about 4 weeks (around 01/22/2017), or if symptoms worsen or fail to improve.   Right Shoulder Exam   Tenderness  The patient is experiencing tenderness in the biceps tendon.  Range of Motion  Active Abduction: 90  Passive Abduction: 90  Forward Flexion: 110   Tests  Drop Arm: negative Impingement: positive      Patient is alert, oriented, no adenopathy, well-dressed, normal affect, normal respiratory effort. Ambulatory today with assistance of cane and her daughter.   Imaging: Xr Shoulder Right  Result Date: 12/25/2016 Radiographs of right shoulder show osteoarthritis of AC joint. No acute finding.    Labs: Lab Results  Component Value Date   HGBA1C 5.7 (H) 11/24/2014   ESRSEDRATE 15 04/18/2016   ESRSEDRATE 70 Repeated and verified X2. (H) 04/03/2016   ESRSEDRATE 44 (H) 02/16/2016   LABURIC 4.3  08/16/2013   REPTSTATUS 12/01/2014 FINAL 11/24/2014   REPTSTATUS 12/01/2014 FINAL 11/24/2014   CULT  11/24/2014    NO GROWTH 5 DAYS Performed at [facility]  11/24/2014    NO GROWTH 5 DAYS Performed at [facility] 10/07/2013    Orders:  Orders Placed This Encounter  Procedures  . XR Shoulder Right   No orders of the defined types were placed in this encounter.    Procedures: No procedures performed  Clinical Data: No additional findings.  ROS: Review of Systems  Constitutional: Negative for chills and fever.  Musculoskeletal: Positive for arthralgias and myalgias.  Neurological: Negative for weakness and numbness.    Objective: Vital Signs: There were no vitals taken for this visit.  Specialty Comments:  No specialty comments available.  PMFS History: Patient Active Problem List   Diagnosis Date Noted  . Need for prophylactic vaccination against Streptococcus pneumoniae (pneumococcus) 06/28/2016  . Dizziness 05/22/2016  . Venous stasis dermatitis of both lower extremities 02/28/2016  . History of cellulitis 04/05/2015  . Cellulitis of right lower extremity 11/24/2014  . Upper respiratory tract infection 10/05/2014  . Cholelithiasis 07/05/2014  . Anemia of chronic disease 06/15/2014  . Cholecystitis, acute 10/06/2013  . CKD (chronic kidney disease), stage III  10/06/2013  . Gout 04/21/2013  . Cellulitis 08/06/2012  . Vertigo 04/18/2012  . Back pain 01/30/2011  . Aortic valve disorder 01/07/2011  . Edema 10/15/2010  . SHINGLES 04/10/2010  . VITAMIN D DEFICIENCY 11/13/2008  . RENAL INSUFFICIENCY 11/13/2008  . GLAUCOMA 05/09/2008  . COLONIC POLYPS 11/09/2007  . Diverticulosis of large intestine 11/09/2007  . Hypothyroidism 07/08/2007  . HYPERCHOLESTEROLEMIA 07/08/2007  . Anxiety state 07/08/2007  . Essential hypertension 07/08/2007  . Venous (peripheral) insufficiency 07/08/2007  . ASTHMATIC  BRONCHITIS, ACUTE 07/08/2007  . GERD 07/08/2007  . Osteoarthritis 07/08/2007  . PALPITATIONS, HX OF 07/08/2007   Past Medical History:  Diagnosis Date  . Acute bronchitis   . Anxiety state, unspecified   . Benign neoplasm of colon   . CKD (chronic kidney disease), stage III 10/06/2013  . Diverticulosis of colon (without mention of hemorrhage)   . DJD (degenerative joint disease)   . GERD (gastroesophageal reflux disease)   . Glaucoma   . Hypertension   . Hypothyroidism   . Palpitations   . Pure hypercholesterolemia   . Renal insufficiency   . Unspecified venous (peripheral) insufficiency   . Vitamin D deficiency disease     Family History  Problem Relation Age of Onset  . Heart disease Mother   . Cancer Father     Throat  . Heart disease Father     Past Surgical History:  Procedure Laterality Date  . ABDOMINAL HYSTERECTOMY    . BREAST BIOPSY     Benign  . CATARACT EXTRACTION    . ERCP N/A 10/06/2013   Procedure: ENDOSCOPIC RETROGRADE CHOLANGIOPANCREATOGRAPHY (ERCP);  Surgeon: Ladene Artist, MD;  Location: [facility];  Service: Endoscopy;  Laterality: N/A;  . TOE AMPUTATION  08/2009   Right second toe - Dr Beola Cord  . TOTAL KNEE ARTHROPLASTY  1999   right  . TOTAL KNEE ARTHROPLASTY  11/2008   left - Dr Lorin Mercy   Social History   Occupational History  .  Retired   Social History Main Topics  . Smoking status: Never Smoker  . Smokeless tobacco: Never Used  . Alcohol use No  . Drug use: No  . Sexual activity: Not on file

## 2016-12-26 ENCOUNTER — Ambulatory Visit: Payer: Medicare Other | Admitting: Rehabilitative and Restorative Service Providers"

## 2016-12-26 ENCOUNTER — Other Ambulatory Visit: Payer: Self-pay | Admitting: Pulmonary Disease

## 2016-12-28 ENCOUNTER — Encounter (HOSPITAL_COMMUNITY): Payer: Self-pay | Admitting: Family Medicine

## 2016-12-28 ENCOUNTER — Ambulatory Visit (HOSPITAL_COMMUNITY)
Admission: EM | Admit: 2016-12-28 | Discharge: 2016-12-28 | Disposition: A | Discharge status: Home / Self Care | Payer: Medicare Other | Attending: Internal Medicine | Admitting: Internal Medicine

## 2016-12-28 ENCOUNTER — Ambulatory Visit (INDEPENDENT_AMBULATORY_CARE_PROVIDER_SITE_OTHER): Payer: Medicare Other

## 2016-12-28 DIAGNOSIS — S51011A Laceration without foreign body of right elbow, initial encounter: Secondary | ICD-10-CM

## 2016-12-28 DIAGNOSIS — S52514A Nondisplaced fracture of right radial styloid process, initial encounter for closed fracture: Secondary | ICD-10-CM

## 2016-12-28 DIAGNOSIS — M25531 Pain in right wrist: Secondary | ICD-10-CM | POA: Diagnosis not present

## 2016-12-28 DIAGNOSIS — M25521 Pain in right elbow: Secondary | ICD-10-CM | POA: Diagnosis not present

## 2016-12-28 DIAGNOSIS — S59901A Unspecified injury of right elbow, initial encounter: Secondary | ICD-10-CM | POA: Diagnosis not present

## 2016-12-28 NOTE — Progress Notes (Signed)
Orthopedic Tech Progress Note Patient Details:  [name] 02/05/1915 [id number]  Ortho Devices Type of Ortho Device: Arm sling, Volar splint Ortho Device/Splint Location: rue Ortho Device/Splint Interventions: Ordered, Application   Karolee Stamps 12/28/2016, 10:24 PM

## 2016-12-28 NOTE — ED Triage Notes (Signed)
Pt had fall today striking left arm. Unsure of how she feel. Bleeding to left elbow and skin tear. No deformity.

## 2016-12-28 NOTE — ED Notes (Signed)
Notified ortho tech ?

## 2016-12-28 NOTE — Discharge Instructions (Signed)
Please see you regular doctor next week to have him or her re-evaluate your wrist and your wound. I printed your xray results today, and please take this xray result to your appointment next week.   Keep the splint on until you see your doctor next week.

## 2016-12-28 NOTE — ED Provider Notes (Signed)
CSN: [id number]     Arrival date & time 12/28/16  1943 History   First MD Initiated Contact with Patient 12/28/16 2133     Chief Complaint  Patient presents with  . Arm Injury   (Consider location/radiation/quality/duration/timing/severity/associated sxs/prior Treatment) Patient is 83 y.o. Female, appears very well for her age,  is here for a fall occurred prior to arrival with right elbow pain and wrist pain and skin tear over the right elbow. Her fall was due to loss of balance secondary to dizziness that have been present for a while and is currently under neurology care. During the fall, she tried to catch herself with her hand and is now having right elbow pain and wrist pain. She reports to have full ROM. Elbow pain is more noticeable compared to the wrist. She denies LOC, headache or visual disturbances. She is not on blood thinner but is taking Aspirin.       Past Medical History:  Diagnosis Date  . Acute bronchitis   . Anxiety state, unspecified   . Benign neoplasm of colon   . CKD (chronic kidney disease), stage III 10/06/2013  . Diverticulosis of colon (without mention of hemorrhage)   . DJD (degenerative joint disease)   . GERD (gastroesophageal reflux disease)   . Glaucoma   . Hypertension   . Hypothyroidism   . Palpitations   . Pure hypercholesterolemia   . Renal insufficiency   . Unspecified venous (peripheral) insufficiency   . Vitamin D deficiency disease    Past Surgical History:  Procedure Laterality Date  . ABDOMINAL HYSTERECTOMY    . BREAST BIOPSY     Benign  . CATARACT EXTRACTION    . ERCP N/A 10/06/2013   Procedure: ENDOSCOPIC RETROGRADE CHOLANGIOPANCREATOGRAPHY (ERCP);  Surgeon: Ladene Artist, MD;  Location: [facility];  Service: Endoscopy;  Laterality: N/A;  . TOE AMPUTATION  08/2009   Right second toe - Dr Beola Cord  . TOTAL KNEE ARTHROPLASTY  1999   right  . TOTAL KNEE ARTHROPLASTY  11/2008   left - Dr Lorin Mercy   Family History  Problem Relation Age of  Onset  . Heart disease Mother   . Cancer Father     Throat  . Heart disease Father    Social History  Substance Use Topics  . Smoking status: Never Smoker  . Smokeless tobacco: Never Used  . Alcohol use No   OB History    No data available     Review of Systems  Constitutional:       As stated in the HPI    Allergies  Enablex [darifenacin hydrobromide er]; Clarithromycin; Latanoprost; Codeine; Morphine; and Sulfonamide derivatives  Home Medications   Prior to Admission medications   Medication Sig Start Date End Date Taking? Authorizing Provider  acetaminophen (TYLENOL) 325 MG tablet Take 325 mg by mouth every 4 (four) hours as needed for mild pain or moderate pain.     Historical Provider, MD  allopurinol (ZYLOPRIM) 300 MG tablet Take 1 tablet by mouth daily. 01/09/15   Historical Provider, MD  ALPRAZolam Duanne Moron) 0.25 MG tablet TAKE 1/2-1 TABLET BY MOUTH 3 TIMES DAILY AS NEEDED FOR ANXIETY 12/03/16   Noralee Space, MD  Ascorbic Acid (VITAMIN C) 500 MG tablet Take 500 mg by mouth daily.      Historical Provider, MD  aspirin 81 MG tablet Take 81 mg by mouth daily.      Historical Provider, MD  bimatoprost (LUMIGAN) 0.01 % SOLN Place 1 drop  into both eyes at bedtime.    Historical Provider, MD  Cholecalciferol (VITAMIN D) 1000 UNITS capsule Take 1,000 Units by mouth daily.      Historical Provider, MD  diltiazem (CARDIZEM CD) 180 MG 24 hr capsule TAKE ONE CAPSULE BY MOUTH EVERY DAY 12/26/16   Noralee Space, MD  ferrous sulfate 325 (65 FE) MG tablet Take 1 tablet (325 mg total) by mouth 2 (two) times daily with a meal. 11/28/14   Hosie Poisson, MD  folic acid (FOLVITE) 1 MG tablet Take 1 tablet (1 mg total) by mouth daily. 11/28/14   Hosie Poisson, MD  furosemide (LASIX) 40 MG tablet TAKE 2 TABLETS BY MOUTH EVERY MORNING 12/03/16   Noralee Space, MD  hydrOXYzine (ATARAX/VISTARIL) 25 MG tablet Take 1 tablet (25 mg total) by mouth every 6 (six) hours as needed. 02/28/16   Noralee Space, MD   levothyroxine (SYNTHROID, LEVOTHROID) 125 MCG tablet TAKE 1/2 TABLET BY MOUTH DAILY BEFORE BREAKFAST 06/04/16   Noralee Space, MD  loperamide (IMODIUM) 2 MG capsule Take 1 capsule (2 mg total) by mouth as needed for diarrhea or loose stools. 11/28/14   Hosie Poisson, MD  meclizine (ANTIVERT) 25 MG tablet Take 1/2-1 tablet by mouth every 6 hours as needed for dizziness 09/03/16   Noralee Space, MD  Multiple Vitamin (MULTIVITAMIN WITH MINERALS) TABS tablet Take 1 tablet by mouth daily. 11/28/14   Hosie Poisson, MD  mupirocin ointment (BACTROBAN) 2 % 1 APPLICATION APPLY ON THE SKIN AS DIRECTED APPLY TO AFFECTED AREA ON HAND AND COVER AS DIRECTED 02/21/15   Historical Provider, MD  Omega-3 Fatty Acids (FISH OIL) 1000 MG CAPS Take 1 capsule by mouth daily.    Historical Provider, MD  omeprazole (PRILOSEC) 20 MG capsule Take 20 mg by mouth daily.      Historical Provider, MD  ondansetron (ZOFRAN) 8 MG tablet Take 1 tablet (8 mg total) by mouth every 6 (six) hours as needed for nausea or vomiting. 12/07/15   Noralee Space, MD  Polyethyl Glycol-Propyl Glycol (SYSTANE) 0.4-0.3 % SOLN Place 1 drop into both eyes 4 (four) times daily.     Historical Provider, MD  potassium chloride (KLOR-CON M10) 10 MEQ tablet TAKE 2 TABLETS (20 MEQ TOTAL) BY MOUTH DAILY. 07/15/16   Noralee Space, MD  predniSONE (DELTASONE) 10 MG tablet TAKE 1 TABLET (10 MG TOTAL) BY MOUTH DAILY WITH BREAKFAST. 08/30/16   Noralee Space, MD  simvastatin (ZOCOR) 20 MG tablet TAKE 1 TABLET (20 MG TOTAL) BY MOUTH DAILY. 04/03/16   Noralee Space, MD  thiamine 100 MG tablet Take 1 tablet (100 mg total) by mouth daily. 11/28/14   Hosie Poisson, MD  traMADol (ULTRAM) 50 MG tablet Take 1 tablet (50 mg total) by mouth every 8 (eight) hours as needed. 07/15/16   Noralee Space, MD   Meds Ordered and Administered this Visit  Medications - No data to display  There were no vitals taken for this visit. No data found.   Physical Exam  Constitutional: She is  oriented to person, place, and time. She appears well-developed and well-nourished.  Cardiovascular: Normal rate, regular rhythm and normal heart sounds.   Pulmonary/Chest: Effort normal and breath sounds normal. No respiratory distress.  Musculoskeletal: Normal range of motion.  She has full ROM of her right elbow and wrist. Elbow non-tender to palpate but the wrist is slightly sore to palpate over the ulnar styloid.   Neurological: She is alert  and oriented to person, place, and time.  Skin:  Has skin tear measures 5 cm near the right elbow, bleeding well-controlled.   Nursing note and vitals reviewed.   Urgent Care Course     .Marland KitchenLaceration Repair Date/Time: 12/28/2016 10:33 PM Performed by: Barry Dienes Authorized by: Sherlene Shams   Consent:    Consent obtained:  Verbal   Consent given by:  Patient   Risks discussed:  Poor wound healing, need for additional repair, infection and pain   Alternatives discussed:  No treatment Anesthesia (see MAR for exact dosages):    Anesthesia method:  None Laceration details:    Location: Elbow.   Length (cm):  5 Repair type:    Repair type:  Simple Treatment:    Wound cleansed with: Dermal wound cleanser.   Amount of cleaning:  Standard Skin repair:    Repair method:  Tissue adhesive Approximation:    Approximation:  Close   Vermilion border: well-aligned   Post-procedure details:    Dressing:  Sterile dressing   Patient tolerance of procedure:  Tolerated well, no immediate complications    (including critical care time)  Labs Review Labs Reviewed - No data to display  Imaging Review Dg Elbow Complete Right  Result Date: 12/28/2016 CLINICAL DATA:  Fall earlier today with right elbow pain. EXAM: RIGHT ELBOW - COMPLETE 3+ VIEW COMPARISON:  None. FINDINGS: There is no evidence of fracture, dislocation, or joint effusion. There is no evidence of arthropathy or other focal bone abnormality. Soft tissues are unremarkable. IMPRESSION:  Negative. Electronically Signed   By: Marin Olp M.D.   On: 12/28/2016 21:07   Dg Wrist Complete Right  Result Date: 12/28/2016 CLINICAL DATA:  Fall earlier tonight.  Right wrist pain. EXAM: RIGHT WRIST - COMPLETE 3+ VIEW COMPARISON:  None. FINDINGS: Diffuse osteopenia. Degenerative change over the radiocarpal joint. Moderate degenerative changes over the radial side of the carpal bones and first and second carpometacarpal joints. Moderate degenerative change over the first MCP joint. Lucent line over the radial styloid likely Mach effect from soft tissue fat plane, although cannot completely exclude a subtle nondisplaced fracture. IMPRESSION: Lucency over the radial styloid likely artifact from adjacent soft tissue, although cannot exclude subtle nondisplaced fracture. Moderate degenerative changes as described. Electronically Signed   By: Marin Olp M.D.   On: 12/28/2016 21:11   MDM   1. Skin tear of right elbow without complication, initial encounter   2. Closed nondisplaced fracture of styloid process of right radius, initial encounter    1) Skin tear repaired using derma bond. Patient tolerated well. See procedure note above.   2) Xray of elbow was negative. Xray of the right wrist shows Lucency over the radial styloid that could be due to artifact but subtle nondisplaced fracture cannot be exclude.   Will apply volar splint today to be on the precaution side. She does have wrist tenderness but over the ulnar styloid instead.  Referred to PCP for re-evaluation next week for her wrist and her wound. Patient denies any questions.      Barry Dienes, NP 12/28/16 2235

## 2016-12-31 ENCOUNTER — Encounter: Payer: Self-pay | Admitting: Pulmonary Disease

## 2016-12-31 ENCOUNTER — Ambulatory Visit (INDEPENDENT_AMBULATORY_CARE_PROVIDER_SITE_OTHER): Payer: Medicare Other | Admitting: Pulmonary Disease

## 2016-12-31 VITALS — BP 126/72 | HR 76 | Temp 98.5°F | Ht 63.0 in | Wt 140.2 lb

## 2016-12-31 DIAGNOSIS — M150 Primary generalized (osteo)arthritis: Secondary | ICD-10-CM | POA: Diagnosis not present

## 2016-12-31 DIAGNOSIS — R42 Dizziness and giddiness: Secondary | ICD-10-CM

## 2016-12-31 DIAGNOSIS — I359 Nonrheumatic aortic valve disorder, unspecified: Secondary | ICD-10-CM | POA: Diagnosis not present

## 2016-12-31 DIAGNOSIS — S6991XD Unspecified injury of right wrist, hand and finger(s), subsequent encounter: Secondary | ICD-10-CM

## 2016-12-31 DIAGNOSIS — M159 Polyosteoarthritis, unspecified: Secondary | ICD-10-CM

## 2016-12-31 DIAGNOSIS — I10 Essential (primary) hypertension: Secondary | ICD-10-CM

## 2016-12-31 DIAGNOSIS — S51011D Laceration without foreign body of right elbow, subsequent encounter: Secondary | ICD-10-CM

## 2016-12-31 NOTE — Patient Instructions (Signed)
Today we updated your med list in our EPIC system...    Continue your current medications the same...  We checked the skin tear at the right elbow & it looks clean...    Continue gentle dressing changes once daily w/ some peroxide, and Neosporin/ bactroban    Then cover w/ gauze & paper tape as discussed...  Contact your Ortho to check your right wrist & get you a different wrist immobilizer...  Please be careful-- no faling allowed!  Call for any questions...  Let's plan a follow up visit in 3-70mo, sooner if needed for any problems.Marland KitchenMarland Kitchen

## 2016-12-31 NOTE — Progress Notes (Signed)
Subjective:    Patient ID: [name], female    DOB: 10-16-1914, 83 y.o.   MRN: [id number]  HPI 83 y/o WF here for a follow up visit... she has mult med problems including:  Hx asthmatic bronchitis;  HBP;  Ven Insuffic & edema;  Hyperchol;  Hypothyroid;  GERD/ Divertics/ Colon polyps;  Renal insuffic;  DJD;  Vit D defic;  Anxiety...  ~  SEE PREV EPIC NOTES FOR OLDER DATA >>    LABS 11/14:  Chems- ok w/ BS=108, Cr=1.3;  Uric=4.3 on Allopurinol100;  CBC- ok w/ Hg=12.6.Marland KitchenMarland Kitchen  December 15, 2013:  63moROV & [name] w/ RUQ pain & dx w/ gallstones, cholecystitis, & had ERCP w/ sphincterotomy (DrStark) & stone extraction; treated w/ Cipro, LFTs improved, course complicated by LE cellulitis requiring addition of Vanco IV; she had f/u DrCornett, [facility] 2/15> brief note, did not rec elective surg given her age...  June 14, 2014:  [facility] by [facility] w/ sudden fever to 103, chills, and left leg pain/ redness/ swelling=> c/w severe cellulitis; she was felt to be septic but no lesions on leg, no areas of broken skin, wearing support hose, her Lasix dose was cut from 836mam to 4059m, other meds the same...   CXR 9/15 showed borderline cardiomeg, underlying chr lung dis, left base atx vs effusion, ?early RUL opac...   EKG 9/15 showed NSR w/ PACs, rate92, NSSTTWA, NAD...  MRI of left leg 9/15> diffuse SQ soft tissue swelling & edema, no discrete abscess identified, no osteomyelitis, no signs of septic arthritis, etc (c/w severe cellulitis).  Ven Dopplers 9/15> no evid of DVT or superficial thrombosis in left leg, enlarged LN's noted in left groin  LABS 9/15 in Hosp> Hg=10-11 range, WBC=29K=>11.7 by disch, BUN/Cr= 39/1.3 improved to 27/0.9 by disch, MRSA=neg, UA=clear...  LABS 9/15 in office f/u>  Chems- ok x Na=131, Cr=1.4;  CBC- ok x   CT Abd&Pelvis 9/15> showed no acute abnormality & no venous obstruction or lymphocele to suggest a cause for left leg  swelling; Cholelithiasis; unchanged bilateral adnexal cystic lesions (probably cysts); unchanged renal cysts and left renal atrophy; unchanged left perianal lipoma...  CXR 10/15 showed clearing of patchy RUL opac & left effusion, lungs now clear & back to baseline, NAD...  LABS 101/15:  Chems- ok x Na=132, BUN=33, Cr=1.3;  CBC- ok w/ Hg=12.0, WBC=7.6;  Sed=54 (improved from 90)...  ~  December 06, 2014:  59mo63mo & [facility] [name] w/ right leg cellulitis- states she was out shopping & "over-did it", felt weak & tired then developed right leg discomfort, increased swelling/ redness/ etc; WBC was 21K, VenDopplers were neg for DVT, treated w/ Zosyn/ Vanco & changed to oral Doxy at disch;  She notes that her leg is improved- less swollen, less red, less painful but still sl tender; she notes that right leg first started giving her trouble after the birth of her last child; she had thorough vasc eval by DrEarly in the past and no surgery indicated; she knows to elim salt/sodium, elev legs, wear support hose; she has finished the Doxy100Bid given at disch & she misunderstood the DC directions and has been taking her prev Lasix80mg13m... We reviewed the following medical problems during today's office visit >> NOTE: as an afterthought she noted vag itching & wanted Rx- offered Diflucan orally & if not improved she will need Gyn eval (she  has Atarax as well on her med list)...     HBP> on Diltiazem180, Furosemide 19mQam, & off KCl since disch;  BP=134/62 today & she denies CP, palpit, dizzy, ch in SOB, etc...    VI, Edema, Cellulitis> she knows to elim sodium, elevate, wear support hose; still taking Lasix80Qam; Hosp 3/16 as above & disch on Doxy- now out & right leg improved she says; RLE chr larger than LLE, sl red/ tender; Rec decr Lasix40.     CHOL> on Simva20 Qhs + low fat diet; last FLP 5/13 showed TChol 191, TG 81, HDL 82, LDL 93... Needs to ret fasting for f/u  blood work.    Hypothy> on Synthroid 1224m- 1/2 tab daily; labs 3/16 showed TSH= 1.21    Renal Insuffic> Creat prev up to 1.8 on the Lasix 8014mCreat 3/16= 1.52=>1.12    Derm> she has Psoriasis on her arms & has been evaluated by DrJordan/Jones; they also treated the LE cellulitis; Rec Atarax prn itching...    Anemia> Hosp 1/15 w/ gallstones and cholecystits (no surg); Hg= 7.5-9.4 at that time & Hg improved to 12.7; labs 3/16 showed Hg= 13.2=>10.6, Fe=13 (7%sat), Ferritin=104, on FeSO4... We reviewed prob list, meds, xrays and labs> see below for updates >>   CXR 3/16 showed norm heart size, mild left base atx, otherw OK...   EKG 3/16 showed NSR, rate86, PACs, poor r prog V1-2, NAD... Marland KitchenMarland KitchenVen Dopplers 3/16 were neg for DVT...   2DEcho 3/16 showed mild LVH w/ mild focal basal septal hypertrophy, norm LVF w/ EF=60-65%, norm wall motion, Gr1DD, norm AoV, mild MR, mild LA dil, norm RV, PAsys=38...  LABS 3.16:  Chems- ok x BUN=37 due to Lasix80 she was on & we decr her to 50m74m CBC- wnl,  Sed=65;  PCT<0.10 indicating +inflamm but no infection now...  PLAN>> we decided to check f/u labs (BMet, CBC, Sed, PCT); Rec to apply cool compresses to right leg several times daily, elim salt/sodium, elev legs, wear support hose; recurrent cellulitis episodes may require suppressive antibiotic therapy... Labs ret w/ BUN=37 7 we decr her Lasix from 80=>40/d; elev Sed w/ neg PCT & we are holding any further antibiotics for now...  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 We reviewed the following medical problems during today's office visit >>     HBP> on Diltiazem180, Furosemide 50mgQ71m& KCl Bid;  BP=120/70  today & she denies CP, palpit, dizzy, ch in SOB, etc...    VI, Edema, Cellulitis> she knows to elim sodium, elevate, wear support hose; still taking Lasix40Qam; Hosp 3/16 as above & disch on Doxy- right leg improved she says; RLE chr larger than LLE, sl red/ tender; Rec continue Lasix40.     CHOL> on Simva20 Qhs + low fat diet; last FLP 5/13 showed TChol 191, TG 81, HDL 82, LDL 93... Needs to ret fasting for f/u blood work.    Hypothy> on Synthroid 125mcg-59m tab daily; labs 3/16 showed TSH= 1.21    Renal Insuffic> Creat prev up to 1.8 on Lasix 80mg; C61m 3/16= 1.52=>1.12; Labs 7/16 stable w/ BUN=35, Cr=1.13    Hx severe DJD, Gout w/ tophi, s/p bilat  TKRs> prev followed by Ladene Artist; on Allopurinol, Tramadol, Tylenol...    Derm> she has Psoriasis on her arms & has been evaluated by DrJordan/Jones; they also treated the LE cellulitis; Rec Atarax prn itching...    Anemia> Hosp 1/15 w/ gallstones and cholecystits (no surg); Hg= 7.5-9.4 at that time & Hg improved to 12.7; labs 7/16 showed Hg= 12.1, Fe=102 (32%sat), Ferritin=116, on FeSO4/d... We reviewed prob list, meds, xrays and labs> see below for updates >>   LABS 7/16:  Chems- ok w/ BUN=35, Cr=1.13, BS=100, BNP=268;  CBC- ok w/ Hg=12.1, Fe=102 (32%), Ferritin=116...  ~  August 07, 2015:  32moROV & post-ER follow up visit> [place] HA rated 8/10> frontal HA, assoc n/v, felt weak & unsteady; BP was ok, VS were stable, Labs ok, CT Head was neg- wnl; Tylenol helped... we reviewed the following medical problems during today's office visit >>     HBP> on Diltiazem180, Furosemide 422mam, & KCl Bid;  BP=124/60 today & she denies CP, palpit, dizzy, ch in SOB, etc...    VI, Edema, Hx cellulitis> she knows to elim sodium, elevate, wear support hose; on Lasix40Qam; [facility] D[facility]- right leg improved; RLE chr larger than LLE, sl red/ tender; Rec continue Lasix40.     CHOL> on Simva20 Qhs + low fat diet; last FLP 5/13  showed TChol 191, TG 81, HDL 82, LDL 93... Needs to ret fasting for f/u blood work.    Hypothy> on Synthroid 12579m 1/2 tab daily; labs 3/16 showed TSH= 1.21    Renal Insuffic> Creat prev up to 1.8 on Lasix 38m75mreat 3/16= 1.52=>1.12; Labs 7/16 stable w/ BUN=35, Cr=1.13    Hx severe DJD, Gout w/ tophi, s/p bilat TKRs> prev followed by DrDeveshwar; on Allopurinol, Tramadol, Tylenol...    Derm> she has Psoriasis on her arms & has been evaluated by DrJordan/Jones; they also treated the LE cellulitis; Rec Atarax prn itching...    Anemia> Hosp 1/15 w/ gallstones and cholecystits (no surg); Hg= 7.5-9.4 at that time & Hg improved to 12.7; labs 7/16 showed Hg= 12.1, Fe=102 (32%sat), Ferritin=116, on FeSO4/d...    Anxiety>  On Xanax 0.25- 1/2 to 1 tab tid prn... EXAM shows Afeb, VSS, O2sat=96% on RA:  Heent- neg, mallampati1;  Chest- clear w/o w/r/r;  Heart- RR gr1/6SEM w/o r/g;  Abd- soft, nontender, neg;  Ext- VI, 1+edema, ecchymoses;  Neuro- no focal neuro deficits...   CT Head 07/20/15>  Intact, WNL, NAD...  Marland KitchenMarland KitchenG 07/20/15 showed NSR, rate81, poor R prog V1-2, NAD...  Labs 06/2015>  Chems- wnl;  CBC- wnl... IMP/PLAN>>  OK Flu vaccine today;  Continue same meds....  ~  February 28, 2016:  70mo 870mo& [name] was seen by MW 5/[facility]/17 w/ ?cellulitis right leg, she had been seeing Derm & Rx w/ creams but no better; she was Afeb, WBC=8.4K, Sed=44; he prescribed Keflex but no better she says & pt has followed up w/ Derm=> legs wrapped Qwk now, swelling worse, wt up 7# to 142# today => this time Rx w/ Pred10, Atarax, incr Lasix80 & no salt...    HBP> on Diltiazem180, Furosemide 40mgQ72m& KCl 10Bid;  BP=120/60 today & she denies CP, palpit, dizzy, ch in SOB, etc...    VI, Edema, Hx cellulitis> she knows to elim sodium, elevate, wear support hose; on Lasix40Qam; Hosp 3/16 & disch on Doxy; Hx cellulitis & non-infection inflammation treated w/ diff antibiotics and Pred, Lasix, no salt, wraps &  creams from [facility],  ER visits, and [place]...    CHOL> on Simva20 Qhs + low fat diet; last FLP 5/13 showed TChol 191, TG 81, HDL 82, LDL 93... Needs to ret fasting for f/u blood work.    Hypothy> on Synthroid 167mg- 1/2 tab daily; labs 3/16 showed TSH= 1.21    Renal Insuffic> Creat prev up to 1.8 on Lasix 879m Creat 3/16= 1.52=>1.12; Labs 2016 stable w/ BUN=23-37, Cr=1.13-1.28; Labs 5/17 showed BUN=25, Cr=1.33    Hx severe DJD, Gout w/ tophi, s/p bilat TKRs> prev followed by DrDeveshwar; on Allopurinol, Tramadol, Tylenol...    Derm> she has Psoriasis on her arms & has been evaluated by DrJordan/Jones; they also treated the LE cellulitis/ inflamm w/ wraps and creams, Atarax prn itching...    Anemia> Hosp 1/15 w/ gallstones and cholecystits (no surg); Hg= 7.5-9.4 at that time & Hg improved to 12.7; labs 7/16 showed Hg= 12.1, Fe=102 (32%sat), Ferritin=116, on FeSO4/d; labs 5/16 showed Hg=11.8    Anxiety>  On Xanax 0.25- 1/2 to 1 tab tid prn... EXAM shows Afeb, VSS, O2sat=98% on RA:  Heent- neg, mallampati1;  Chest- decr BS w/o w/r/r;  Heart- RR gr1/6SEM w/o r/g;  Abd- soft, nontender, neg;  Ext- VI, +edema to above knees, leg wrapped;  Neuro- intact... IMP/PLAN>>  Not better w/ Keflex, looks more inflammed than infected=> Rx w/ Pred10, Atarax25 prn, incr Lasix40-2Qam & no salt, ROV in 2wks...   ~  April 03, 2016:  [place] c/o chest congestion, wheezing, cough, sl yellow sput, ?low grade fever; had already been to [facility]CE[facility]ellinex; CXR showed diffuse peribronchial cuffing, no airsp dis; given Augmentin, bump Pred, Mucinex;  She states "I was never that sick before" but breathing is fine now & she denies CP/ change is SOB/ cough/ sputum, f/c/s, etc;  But while her breathing is improved- her left leg is swollen & painful again- NOTE: she ran out of Pred 1042m 2 weeks ago!  We decided to check labs>  Chems- ok x BUN=27, Cr=1.51;  CBC- anemic w/ Hg=9.9, MCV=98, WBC=7.8; Sed=70 (up from 44);  Rx w/ Lasix80  & Pred10m63magain; ROV in 2-3wks and continue follow up w/ Derm...  CXR 03/13/16 showed norm heart size, atherosclerotic Ao, mild peribronch cuffing, no edema, clear- NAD, kyphosis...  ~  April 18, 2016:  2wk [place] legs are much [place], decr swelling, wt down 5# more to 129# today; skin is improved w/ the Pred10- less red, no open areas, no weeping, no cellulitis, no f/c/s;  She went back to [employer] wraps used just topical cream which didn't seem to help much previously;  She spent a lot of the visit talking about her glaucoma- [facility]Fleishman at [facility] all he can do, [place] wanted me to know that she is back on eye drops now;  We rechecked labs today>  Chems- ok w/ BUN=33, Cr=1.28;  TSH=1.42;  Sed=15 now on Pred 10mg84m.     EXAM shows Afeb, VSS, O2sat=100% on RA:  Heent- neg, mallampati1;  Chest- decr BS w/o w/r/r;  Heart- RR gr1/6SEM w/o r/g;  Abd- soft, nontender, neg;  Ext- VI, much improved w/ decr edema & erythema;  Neuro- intact... IMP/PLAN>>  Rec to contuinue low sodium diet & the Lasix40- 2tabs Qam;  OK to wean the Pred 10mg 34m to 1/2 tab Qam & stay on  this!  ROV in 1 month...  ~  May 22, 2016:  510moROV & [name]'s CC is intermittent dizziness, using Meclizine w/ some improvement, fell onto her bed one time- reminded to change positions slowly, careful w/ ambulation & may need cane or walker if not stable;  [facility] for an eval that she saw in the newspaper recently...  We reviewed the following medical problems during today's office visit >>     HBP> on Diltiazem180, Furosemide '40mg'$ Qam, & KCl 10Bid;  BP=120/60 today & she denies CP, palpit, dizzy, ch in SOB, etc...    VI, Edema, Hx cellulitis> she knows to elim sodium, elevate, wear support hose; on Lasix40-2Qam & PRED10-1/2Qam; [facility]; Hx cellulitis & non-infection inflammation treated w/ diff  antibiotics and Pred, Lasix, no salt, wraps & creams from Derm DrJordan, ER visits, and us=> now improved on tapering course of Pred- rec to continue '5mg'$ /d for now (Sed=15)...     CHOL> off Simva20 & on low fat diet; last FLP 5/13 showed TChol 191, TG 81, HDL 82, LDL 93... Needs to ret fasting for f/u blood work but she does not want Chol meds!    Hypothy> on Synthroid 1278m-1/2 tab daily; labs 7/17 showed TSH= 1.42 & rec to continue same...    Renal Insuffic> Creat prev up to 1.8 on Lasix '80mg'$ ; Creat 3/16= 1.52=>1.12; Labs 2016 stable w/ BUN=23-37, Cr=1.13-1.28; Labs 5-7/17 showed BUN=25-33, Cr=1.28-1.33 (stable on Lasix40-2Qam).    Hx severe DJD, Gout w/ tophi, s/p bilat TKRs> prev followed by DrDeveshwar; on Allopurinol300, Tramadol50, Tylenol...    Derm> she has Psoriasis on her arms & has been evaluated by DrJordan/Jones; they also treated the LE cellulitis/ inflamm w/ wraps and creams, Atarax prn itching...    Anemia> Hosp 1/15 w/ gallstones and cholecystits (no surg); Hg= 7.5-9.4 at that time & Hg improved to 12.7; labs 7/16 showed Hg= 12.1, Fe=102 (32%sat), Ferritin=116, on FeSO4; labs 5-7/17 showed Hg=11.8=>9.9    Anxiety>  On Xanax 0.25- 1/2 to 1 tab tid prn... EXAM shows Afeb, VSS, O2sat=100% on RA: Wt= 131#; Heent- neg, mallampati1; Chest- decr BS w/o w/r/r; Heart- RR gr1/6SEM w/o r/g; Abd- soft, nontender, neg; Ext- VI, much improved w/ decr edema & erythema; Neuro- intact... IMP/PLAN>>  We discussed the poss need for further eval of the dizziness but for now she would like to continue the Meclizine prn & try the epley maneuvers (reviewed w/ pt & hand-out given); she knows to be very careful w/ changing positions and ambulation;  She will continue the Pred '5mg'$  Qam & her Lasix4-=2Qam, avoid sodium, rov 10m79mo   ~  July 15, 2016:  10mo34mo & [name] reports that the AntiWoxalleuvers have helped her dizziness some; she has had a lot of problems w/ her eyes[facility]'s had a reaction to the eye drops & they are adjusting (she feels this is responsible for some of her dizziness);  She has mult somatic complaints- insomnia (rec melatonin/ benedryl), sneezing (rec zyrtek/ claritin), forgetfulness (she is 89 y87 & still pretty sharp)...     BP is controlled on her Diltiazem180, Furosemide40, K10Bid; BP=110/68 & she remains asymptomatic x the dizziness...    Weight is stable & legs look good on her Lasix, low sodium diet, & the Pred5/d...    Renal is stable w/ last Cr=1.28 on 03/2016...Marland KitchenMarland Kitchen  Hg is stable w/ last CBC 03/2016 showing Hg~10... EXAM shows Afeb, VSS, O2sat=98% on RA: Wt= 130#, stable; Heent- neg, mallampati1; Chest- decr BS w/o w/r/r; Heart- RR gr1/6SEM w/o r/g; Abd- soft, nontender, neg; Ext- VI, much improved w/ decr edema & erythema... IMP/PLAN>>  We reviewed her medical hx & discussed each concerning symptom individually to her satisfaction; we decided to continue the current meds the same & recheck pt in ~50mo..   ~ October 15, 2016:  369moOV & Lynelle has continued to complain about intermittent dizziness despite the Antivert & Epley maneuvers;  She was eval by ENT- DrCNewman & she tells me "it's not vertigo" & she has an appt w/ Neuro sched for further eval (we do not yet have the ENT note to review & we've called for this data);  She says she is fearful of falling & rec to use cane/ walker/ etc;  She further reports a bad experience at Rheum office... We reviewed the following medical problems during today's office visit >>     HBP> on ASA81, Diltiazem180, Furosemide 4033mm, & KCl 10Bid;  BP=136/64 today & she denies CP, palpit, ch in SOB, etc... She has persistent dizziness & awaiting eval by Neuro.    VI, Edema, Hx cellulitis> she knows to elim sodium, elevate, wear support hose; on Lasix40Qam & PRED5Qam; Hosp 3/16 & disch on Doxy; Hx cellulitis & non-infection inflammation treated w/ diff antibiotics and Pred,  Lasix, no salt, wraps & creams from Derm DrJordan, ER visits, and us=> now improved on tapering course of Pred- rec to continue 5mg73mfor now (Sed=15)...     CHOL> off Simva20 & on low fat diet; last FLP 5/13 showed TChol 191, TG 81, HDL 82, LDL 93... Needs to ret fasting for f/u blood work but she does not want Chol meds!    Hypothy> on Synthroid 125mc18m2 tab daily; labs 7/17 showed TSH= 1.42 & rec to continue same...    Renal Insuffic> Creat prev up to 1.8 on Lasix 80mg;79mat 3/16= 1.52=>1.12; Labs 2016 stable w/ BUN=23-37, Cr=1.13-1.28; Labs 5-7/17 showed BUN=25-33, Cr=1.28-1.33 (stable on Lasix40Qam).    Hx severe DJD, Gout w/ tophi, s/p bilat TKRs> prev followed by DrDeveshwar; on Allopurinol300, Tramadol50, Tylenol...    Derm> she has Psoriasis on her arms & has been evaluated by DrJordan/Jones; they also treated the LE cellulitis/ inflamm w/ wraps and creams, Atarax prn itching...    Anemia> Hosp 1/15 w/ gallstones and cholecystits (no surg); Hg= 7.5-9.4 at that time & Hg improved to 12.7; labs 7/16 showed Hg= 12.1, Fe=102 (32%sat), Ferritin=116, on FeSO4; labs 5-7/17 showed Hg=11.8=>9.9    Anxiety>  On Xanax 0.25- 1/2 to 1 tab tid prn... EXAM shows Afeb, VSS- no postural BP change, O2sat=100% on RA: Wt= 131#; Heent- neg, mallampati1; Chest- decr BS w/o w/r/r; Heart- RR gr1/6SEM w/o r/g; Abd- soft, nontender, neg; Ext- VI, much improved w/ decr edema & erythema; Neuro- intact... IMP/PLAN>>  [name] y/o41/ mult somatic complaints and prob list as above; we reviewed her medical issues and the meds used to treat them; her persistent complaint is dizziness & eval by ENT neg, eval by Neuro is pending; we reviewed her med- continue same; we will continue Q79mo vi279mo... Note: >50% of the 25min v12m today was spent in counseling and coordination of care...   ~  December 31, 2016:  79mo ROV 50morginia will turn 89 next month!  She presents for an add-on visit after a fall w/  injury to right wrist,  she called the local fire dept due to a right elbow skin tear- went to the ER where they "glued it", took an XRay (neg for fx) & rec that she f/u w/ Ortho- DrYates...    When she was here last Jan2018> she was c/o persistent dizziness, no real benefit from antivert or epley maneuvers, and we referred her to Neuro;  She saw [facility] 10/31/16> he did not rec any further testing/ imaging & she says he indicated it was prob a stroke + neuropathy sent her to Neuro Rehab (finished 12/06/16) w/ some objective improvement but no real subjective improvement;  She is also having some issues w/ her eyes & is seeing DrGroat for this currently (prev followed [facility] then [facility]=> now in [place]);  "I'm falling apart" she says...    EXAM shows Afeb, VSS- no postural BP change, O2sat=100% on RA: Wt= 140#; Heent- neg, mallampati1; Chest- decr BS w/o w/r/r; Heart- RR gr1/6SEM w/o r/g; Abd- soft, nontender, neg; Ext- VI, much improved w/ decr edema & erythema; Neuro- intact...  XRay right elbow 12/29/15>  No fx etc, NEG XRAy...  XRay right wrist 12/28/16>  Diffuse osteopenia, degen changes, lucency over radial styloid is likely artifact... IMP/PLAN>>  Elbow skin laceration checked & dressed; rec to f/u w/ Ortho regarding a better wrist brace; continue same meds...           Problem List:  GLAUCOMA (ICD-365.9) - hx mult eye problems w/ prev corneal transplant & glaucoma laser eye surg... on eye drops per Ophthalmology at [facility]...  ASTHMATIC BRONCHITIS, ACUTE (ICD-466.0) - Hx AB requiring Rx w/ Depo/ Pred/ Mucinex/ Tussionex... ~  CXR 4/11 showed vague nodular densities on left... ~  8/11:  she went to an [facility] w/ cough, sputum, & dx w/ pneumonia rx w/ ZPak... ~  CXR 3/12 showed borderline cardiomeg, nodular opac left base, NAD... ~  Spring 2013: she notes some allergy symptoms... ~  CXR 1/15 showed mild cardiomeg, atherosclerotic & tortuous Ao, low lung vol & sl incr markings, NAD.[name].  ~  CXR 9/15 showed  borderline cardiomeg, underlying chr lung dis, left base atx vs effusion, ?early RUL opac... ~  CXR 10/15 showed clearing of patchy RUL opac & left effusion, lungs now clear & back to baseline, NAD ~  CXR 3/16 in hosp showed norm heart size, mild left base atx, otherw OK...   HYPERTENSION (ICD-401.9) - controlled on ASA 26m/d,  CARTIAXT 18106md, LASIX 4061m 1-2Qam... ~  7/10:  labs showed BUN=54, Creat=2.0 & rec to decr diuretics in half= one Demedex & 1/2 Aldactone. ~  11/10:  labs showed BUN=38, Creat=1.8 & rec to keep same meds. ~  8/11:  labs showed BUN=42, Creat=2.1, K=4.3 ~  1/12:  labs in ER showed BUN=56, Creat=2.67, therefore diuretics decr to Demadex20/d only. ~  3/12:  2DEcho showed mod LVH, norm sys function w/ EF=60-65% & no regional wall motion abn; Gr1DD, mibile density on AoV- prob lambl'e escrescence, mildMR. ~  5/12:  f/u labs in office showed BUN=45, Creat=1.8, on Lasix 78m24mm for her edema... ~  9/12:  [facility]33, Creat=1.47, edema resolved therefore decr Lasix to 40mg82m~  1/13:  BP= 160/76 but hasn't taken meds today; labs improved w/ BUN=26, Creat=1.3, BNP=50 ~  5/13:  BP= 140/78 & denies CP, palpit, SOB, edema; labs improved w/ BUN=28, Creat=1.2 ~  9/13:  BP= 140/60 & she denies CP, palpt, ch in  SOB or edema... ~  12/13: controlled on Diltiazem180 & Furosemide 47m-2Qam w/ BP=138/62 today & she denies CP, palpit, dizzy, ch inSOB, etc... ~  3/14:  BP= 130/66 on same meds; Labs stable as well w/ Cr=1.5, continue same... ~  7/14: on Diltiazem180 & Furosemide 432m2Qam w/ BP=150/64 today & she denies CP, palpit, dizzy, ch in SOB, etc. ~  11/14: BP= 126/64 on same meds, stable... ~  EKG 1/15 showed NSR, rate92, NSSTTWA, NAD... ~  3/15: on Diltiazem180 & Furosemide 4029mQam w/ BP=118/70 today & she denies CP, palpit, dizzy, ch in SOB, etc. ~  9/15: post hosp> off Diltiazem now & still on Furosemide 17m60m-2Qam & K10-2/d w/ BP=146/60 w/ leg  swelling, edema, cellulitis as below... ~  1/16: on Diltiazem180, Furosemide 17mg58mm, & K10-2/d;  BP=132/64 today & she denies CP, palpit, dizzy, ch in SOB, etc. ~  3/16: on Diltiazem180, Furosemide 17mgQ17m& off KCl since disch;  BP=134/62 today & she denies CP, palpit, dizzy, ch in SOB, etc ~  7/16: on Diltiazem180, Furosemide 17mgQa25m KCl Bid;  BP=120/70 & she remains asymptomatic...  PALPITATIONS, HX OF (ICD-V12.50) ~  3/16:  EKG showed NSR, rate86, PACs, poor r prog V1-2, NAD...  VENOUS INSUFFICIENCY/ EDEMA >> swelling controlled w/ diuretics, low sodium diet, elevation, support hose, etc... RIGHT LEG SWELLING > LEFT LEG... Pt notes it's been like this since her last child was born... Hx RECURRENT CELLULITIS >>  ~  She saw DrEarly [facility]- there was nothing he could do... ~  Swelling diminished w/ low sodium, elevation, support hose, & Lasix40 1-2tabs daily... ~  Nov-Dec2013: she knows to elim sodium, elevate, wear support hose; she has been seen by [facility], DrEarly & there is nothing they can do; recently checked by Derm- DPayton Mccallumes w/ patch dressing & support hose; edema decr w/ Lasix 80mg/d;54m today shows Creat=1.5... ~  7/[name]4:  she knows to elim sodium, elevate, wear support hose, & the Lasix80; weight is down 10# today to 141# w/ resolution of edema; [name] (Derm) treated dermatitis w/ new cream & improved. ~  11/14: wt is back up 8# to 149# today & she needs to elim sodium, elev legs, etc... ~  3/15: she knows to elim sodium, elevate, wear support hose, & the Lasix80; weight is back down 9# today to 137#... ~  9/[name]15: [facility] w/ 4+edema etc; VenDopplers neg for DVT; no open wounds/ drainage; treated w/ IV antibiotics and disch on Keflex; has persistent erythema, pain, swelling & we decided to continue the Keflex & refer to ID... ~  CT Abd&Pelvis 9/15> showed no acute abnormality & no venous obstruction or lymphocele to suggest a cause for left leg swelling;  Cholelithiasis; unchanged bilateral adnexal cystic lesions (probably cysts); unchanged renal cysts and left renal atrophy; unchanged left perianal lipoma... ~  10/15: she is improved after the extended course of Keflex w/ decr erythema, swelling & discomfort; she did not see ID; plan is to continue same meds, cleansing, elevation, no salt, support hose... ~  3/16: she was Adm w/ right leg cellulitis- states she was out shopping & "over-did it", felt weak & tired then developed right leg discomfort, increased swelling/ redness/ etc; WBC was 21K, VenDopplers were neg for DVT, treated w/ Zosyn/ Vanco & changed to oral Doxy at disch;  She notes that her leg is improved- less swollen, less red, less painful but still sl tender; ~  07/2015: back to baseline... ~  02/2016:  Recurrent redness/ swelling 01/2016- no better on Keflex from DrWert + topical wraps and creams from [place], Derm; we decided to treat w/ Pred10, incr Lasix80, no salt, Atarax for itching... ~  03/2016:  Much improved on the Pred10 + Lasix80 Qam; renal function is stable, the edema is diminished, and sed rate is now down to 15!  OK to wean Pred to 4m Qam...  HYPERCHOLESTEROLEMIA (ICD-272.0) - prev Rx'd by DrGegick... Now on ZOCOR 264md ([facility]5 & [facility]  [facility] due [facility]$$) w/ TChol 285, TG 204, HDL 69, LDL 195... "he was mad at me" ~  [facility], TG 60, HDL 112, LDL 99 ~  She is reminded (again) to come FASTING for f/u FLP... ~  [place] on Simva20 showed TChol 191, TG 81, HDL 82, LDL 93 ~  She is reminded to ret FASTING for f/u blood work...  HYPOTHYROIDISM (ICD-244.9) - prev followed by DrGegick on SYNTHROID 12518m 1/2 daily... ~  [facility] w/ TSH= 2.90, FreeT4= 1.09 ~  labs 8/11 showed TSH= 1.87 ~  Labs 3/12 showed TSH= 2.81 ~  Labs 1/13 showed TSH= 1.65 ~  Labs 3/14 showed TSH= 4.28 ~  Labs 3/16 showed TSH= 1.21  GERD  (ICD-530.81) - uses PRILOSEC 27m7m.. last EGD was 6/06 by DrPatterson showing 3cmHH, reflux...   DIVERTICULOSIS OF COLON (ICD-562.10) & COLONIC POLYPS (ICD-211.3) - last colonoscopy 6/06 was WNL- no abnormality seen...  GALLSTONE and CHOLECYSTITIS >>  ~  [name] w/ RUQ pain & dx w/ gallstones, cholecystitis, & had ERCP w/ sphincterotomy (DrStark) & stone extraction; treated w/ Cipro, LFTs improved, course complicated by LE cellulitis requiring addition of Vanco IV; she had f/u DrCornett, CCS 2/15> brief note, did not rec elective surg given her age. ~  CT Abd&Pelvis 9/15> showed no acute abnormality & no venous obstruction or lymphocele to suggest a cause for left leg swelling; Cholelithiasis; unchanged bilateral adnexal cystic lesions (probably cysts); unchanged renal cysts and left renal atrophy; unchanged left perianal lipoma  RENAL INSUFFICIENCY (ICD-588.9) - tough balance betw renal perfusion & diuresis for edema... ~  labs 10/08 showed BUN= 27, Creat= 1.5 ~  labs 8/09 showed BUN= 44, Creat= 1.9 ~  labs 2/10 showed BUN 46, Creat= 1.7 ~  labs 7/10 showed BUN= 54, Creat= 2.0 ~  labs 11/10 showed BUN= 38, Creat= 1.8 ~  labs 8/11 showed BUN= 42, Creat= 2.1 ~  labs in ER 1/12 showed BUN=56, Creat=2.67, rec decr diuretics to Demadex27mg72mnly. ~  Labs 3/12 showed improved renal function w/ BUN=31, Creat=1.4... ~  Labs 5/12 on Lasix80 showed BUN=45, Creat=1.8 ~  Labs 9/12 by DrDeveshwar showed BUN=33, Creat=1.47 ~  Labs 1/13 showed BUN=26, Creat=1.3, BNP=50... On Lasix 40mg 65m ~  Labs 5/13 showed BUN=28, Creat=1.2 ~  Labs 12/13 on Lasix80 showed BUN=30, Creat=1.5 ~  Labs 3/14 on Lasix80 showed BUN=31, Creat=1.5 ~  Labs 6/14 by DrDeveshwar on Lasix80 showed BUN=30, Cr=1.3 ~  Labs here 11/14 on Lasix80 showed BUN=31, Cr= 1.3 ~  [facility]/p ER[facility], and Creat- 1.1 - 1.7 ~  Labs 9/15 showed Cr= 0.94 to 1.4 range... ~  Labs  10/15 showed Cr= 1.3-1.4 ~  Labs 3/16 showed Cr= 1.52=>1.12 ~  Labs 7/16 showed BUN= 35, Cr= 1.13  DEGENERATIVE JOINT DISEASE (ICD-715.90) - this is her chief  complaint- s/p right TKR in 1999,  left TKR 3/10... on MOBIC 7.39m Prn & VICODIN Prn as well... followed by DrYates; and had right second toe amp by DArnette Norris2010 for hammertoe + spurs... [facility] says)... ~  5/13:  She saw DrDeveshwar for Rheum w/ shot in hip & sl improved... 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 rec- start Vit D 50,000 u weekly... ~  labs 2/10 showed Vit D level = 30... rec- continue Vit D 50K weekly... ~  labs 7/10 showed Vit D level = 39... rec- change to 1000 u OTC daily. ~  labs 8/11 showed Vit D level = 38... continue same. ~  Labs 3/14 showed Vit D level = 43... Continue 1000u daily.  ANXIETY (ICD-300.00) - on ALPRAZOLAM 0.257mid Prn... several deaths in the family... daughter who lives w/ her recently ill...  SHINGLES - she had right T9-10 shingles in 2011 & resolved w/ Valtrex, Pred, etc...   Past Surgical History:  Procedure Laterality Date  . ABDOMINAL HYSTERECTOMY    . BREAST BIOPSY     Benign  . CATARACT EXTRACTION    . ERCP N/A 10/06/2013   Procedure: ENDOSCOPIC RETROGRADE CHOLANGIOPANCREATOGRAPHY (ERCP);  Surgeon: MaLadene ArtistMD;  [facility];  Laterality: N/A;  . TOE AMPUTATION  08/2009   Right second toe - Dr BeBeola Cord. TOTAL KNEE ARTHROPLASTY  1999   right  . TOTAL KNEE ARTHROPLASTY  11/2008   left - Dr [facility]  Outpatient Encounter Prescriptions as of 12/31/2016  Medication Sig  . acetaminophen (TYLENOL) 325 MG tablet Take 325 mg by mouth every 4 (four) hours as needed for mild pain or moderate pain.   . Marland Kitchenllopurinol (ZYLOPRIM) 300 MG tablet Take 1 tablet by mouth daily.  . Marland KitchenALPRAZolam (XANAX) 0.25 MG tablet TAKE 1/2-1 TABLET BY MOUTH 3 TIMES DAILY AS NEEDED FOR ANXIETY  . Ascorbic Acid (VITAMIN C) 500 MG tablet Take 500 mg by mouth daily.    . Marland Kitchenspirin 81 MG tablet Take 81 mg by mouth daily.    . bimatoprost (LUMIGAN) 0.01 % SOLN Place 1 drop into both eyes at bedtime.  . Cholecalciferol (VITAMIN D) 1000 UNITS capsule Take 1,000 Units by mouth daily.    . Marland Kitcheniltiazem (CARDIZEM CD) 180 MG 24 hr capsule TAKE ONE CAPSULE BY MOUTH EVERY DAY  . ferrous sulfate 325 (65 FE) MG tablet Take 1 tablet (325 mg total) by mouth 2 (two) times daily with a meal.  . folic acid (FOLVITE) 1 MG tablet Take 1 tablet (1 mg total) by mouth daily.  . furosemide (LASIX) 40 MG tablet TAKE 2 TABLETS BY MOUTH EVERY MORNING  . hydrOXYzine (ATARAX/VISTARIL) 25 MG tablet Take 1 tablet (25 mg total) by mouth every 6 (six) hours as needed.  . Marland Kitchenevothyroxine (SYNTHROID, LEVOTHROID) 125 MCG tablet TAKE 1/2 TABLET BY MOUTH DAILY BEFORE BREAKFAST  . loperamide (IMODIUM) 2 MG capsule Take 1 capsule (2 mg total) by mouth as needed for diarrhea or loose stools.  . meclizine (ANTIVERT) 25 MG tablet Take 1/2-1 tablet by mouth every 6 hours as needed for dizziness  . Multiple Vitamin (MULTIVITAMIN WITH MINERALS) TABS tablet Take 1 tablet by mouth daily.  . mupirocin ointment (BACTROBAN) 2 % 1 APPLICATION  APPLY ON THE SKIN AS DIRECTED APPLY TO AFFECTED AREA ON HAND AND COVER AS DIRECTED  . Omega-3 Fatty Acids (FISH OIL) 1000 MG CAPS Take 1 capsule by mouth daily.  [name] omeprazole (PRILOSEC) 20 MG capsule Take 20 mg by mouth daily.    . ondansetron (ZOFRAN) 8 MG tablet Take 1 tablet (8 mg total) by mouth every 6 (six) hours as needed for nausea or vomiting.  Vladimir Faster Glycol-Propyl Glycol (SYSTANE) 0.4-0.3 % SOLN Place 1 drop into both eyes 4 (four) times daily.   . potassium chloride (KLOR-CON M10) 10 MEQ tablet TAKE 2 TABLETS (20 MEQ TOTAL) BY MOUTH DAILY.  [name] predniSONE (DELTASONE) 10 MG tablet TAKE 1 TABLET (10  MG TOTAL) BY MOUTH DAILY WITH BREAKFAST.  [name] simvastatin (ZOCOR) 20 MG tablet TAKE 1 TABLET (20 MG TOTAL) BY MOUTH DAILY.  [name] thiamine 100 MG tablet Take 1 tablet (100 mg total) by mouth daily.  . traMADol (ULTRAM) 50 MG tablet Take 1 tablet (50 mg total) by mouth every 8 (eight) hours as needed.   No facility-administered encounter medications on file as of 12/31/2016.     Allergies  Allergen Reactions  . Enablex [Darifenacin Hydrobromide Er] Other (See Comments)    Caused her throat and mouth to have bumps and feel like it was swelling  . Clarithromycin Swelling    REACTION: causes her mouth to swell  . Latanoprost Itching    Eye reddness and swelling and itching  . Codeine Nausea Only  . Morphine Nausea And Vomiting  . Sulfonamide Derivatives Other (See Comments)    REACTION: unsure of reaction    Current Medications, Allergies, Past Medical History, Past Surgical History, Family History, and Social History were reviewed in [employer] record.    Review of Systems        See HPI - all other systems neg except as noted...  The patient complains of dyspnea on exertion, peripheral edema, muscle weakness, and difficulty walking.  The patient denies anorexia, fever, weight loss, weight gain, vision loss, decreased hearing, hoarseness, chest pain, syncope, prolonged cough, headaches, hemoptysis, abdominal pain, melena, hematochezia, severe indigestion/heartburn, hematuria, incontinence, genital sores, suspicious skin lesions, transient blindness, depression, unusual weight change, abnormal bleeding, enlarged lymph nodes, and angioedema.     Objective:   Physical Exam      WD, WN, 83 y/o WF in NAD... GENERAL:  Alert & oriented; pleasant & cooperative... HEENT:  [place]/AT, EOM- full, EACs-clear, TMs-wnl, NOSE-clear, THROAT-clear & wnl. NECK:  Supple w/ fairROM; no JVD; normal carotid impulses w/o bruits; no thyromegaly or nodules palpated; no lymphadenopathy. CHEST:   Clear, decr BS bilat w/o wheezing, rales, or rhonchi heard... HEART:  Regular Rhythm;  gr 1/6 SEM without rubs or gallops detected... ABDOMEN:  Soft & nontender; normal bowel sounds; no organomegaly or masses detected. EXT:  moderate arthritic changes; s/p bilat TKRs; mild varicose veins/ +venous insuffic/ tr edema Left leg, min red & tender... s/p right second toe distal amputation... NEURO:  CN's intact;  no focal neuro deficits... DERM:  No lesions noted; no rash etc...  RADIOLOGY DATA:  Reviewed in the EPIC EMR & discussed w/ the patient...  LABORATORY DATA:  Reviewed in the EPIC EMR & discussed w/ the patient...   Assessment & Plan:    Hx Left LEG CELLULITIS, then Right LEG CELLULITIS>> ?Etiology (NOS), no broken skin or draining areas; treated empirically in [facility] 1/15 & disch on Keflex w/ persistent red/ swollen/ tender left leg; we continued  the Keflex for 2wks longer & wanted ID consult; she never got the consult but improved on the Keflex; now off the antibiotic w/ decr swelling, erythema, discomfort; REC to continue meds, elevation, no salt, support hose, etc...  12/15> she reports left leg red, incr swelling, & drainage- went to Derm, DrJordan & given cream which she reports has helped... 3/16> she was hosp w/ right leg cellulitis- swollen, red, inflammed, tender- but again NOS, no broken skin, etc; treated w/ Zosyn/ Vanco & disch on Doxy=> improved... 7/16> stable w/o recurrent soft tissue infection 11//16> at baseline... 6/17> Recurrent redness/ swelling 01/2016- no better on Keflex from DrWert + topical wraps and creams from DrJordan, Derm; we decided to treat w/ Pred10, incr Lasix80, no salt, Atarax for itching 7/17> Rec to contuinue low sodium diet & the Lasix40- 2tabs Qam;  OK to wean the Pred 1m tabs to 1/2 tab Qam & stay on this!  ROV in 1 month... 8/17> We discussed the poss need for further eval of the dizziness but for now she would like to continue the Meclizine prn &  try the epley maneuvers (reviewed w/ pt & hand-out given); sheknows to be very careful w/ changing positions and ambulation;  She will continue the Pred 5817mQam & her Lasix4-=2Qam, avoid sodium, rov 17m34mo0/9/17>   We reviewed her medical hx & discussed each concerning symptom individually to her satisfaction; we decided to continue the current meds the same & recheck pt in ~52mo69mo1/23/18>   [place]99 [place]44 w/ mult somatic complaints and prob list as above; we reviewed her medical issues and the meds used to treat them; her persistent complaint is dizziness & eval by ENT neg, eval by Neuro is pending; we reviewed her med- continue same; we will continue Q52mo 80mots   URI/ Bronchitis> she presented 1/16 w/ sore throat, cough, yellow sput & we decided to treat w/ Levaquin x5d, Depo80, Pred dosepak; she may also use Mucinex, fluids, & MMW if needed...  HBP>  Controlled on meds, continue same including the Lasix40Qam...  Cardiac>  Prev DrRoss' note reviewed; see 2DEcho, Event Monitor reports no dangerous arrhythmias, continue meds...  Ven Insuffic/ EDEMA- improved>  evals by Cards & VVS> "there is nothing they can do"; continue elevation, no salt, Lasix80 now, compression...  CHOL>  On Simva20 & FLP 5/13 looked good...  HYPOTHYROID>  Continue Synthroid 125mcg53making 1/2 tab daily...  GI>  Stable now s/p ERCP for gallstones; followed by DrStark now... Episode of cholecystitis, gallstones 1/15- s/p ERCP & improved w/o surg (DrStark, DrCornett)...  Renal Insuffic>  Careful w/ diuresis, NSAIDs etc; Creat varied 1.1 to 1.7  DJD>  She saw DrDeveshwar for her Rheum complaints==> shot in knee; DrYates/ [place]nestina Patchessigned off; may need pain clinic....  Other problems as noted => Dizziness, on meclizine, epley maneuvers, and Neuro consult pending.   Patient's Medications  New Prescriptions   No medications on file  Previous Medications   ACETAMINOPHEN (TYLENOL) 325 MG TABLET    Take 325 mg by  mouth every 4 (four) hours as needed for mild pain or moderate pain.    ALLOPURINOL (ZYLOPRIM) 300 MG TABLET    Take 1 tablet by mouth daily.   ALPRAZOLAM (XANAX) 0.25 MG TABLET    TAKE 1/2-1 TABLET BY MOUTH 3 TIMES DAILY AS NEEDED FOR ANXIETY   ASCORBIC ACID (VITAMIN C) 500 MG TABLET    Take 500 mg by mouth daily.     ASPIRIN 81 MG  TABLET    Take 81 mg by mouth daily.     BIMATOPROST (LUMIGAN) 0.01 % SOLN    Place 1 drop into both eyes at bedtime.   CHOLECALCIFEROL (VITAMIN D) 1000 UNITS CAPSULE    Take 1,000 Units by mouth daily.     DILTIAZEM (CARDIZEM CD) 180 MG 24 HR CAPSULE    TAKE ONE CAPSULE BY MOUTH EVERY DAY   FERROUS SULFATE 325 (65 FE) MG TABLET    Take 1 tablet (325 mg total) by mouth 2 (two) times daily with a meal.   FOLIC ACID (FOLVITE) 1 MG TABLET    Take 1 tablet (1 mg total) by mouth daily.   FUROSEMIDE (LASIX) 40 MG TABLET    TAKE 2 TABLETS BY MOUTH EVERY MORNING   HYDROXYZINE (ATARAX/VISTARIL) 25 MG TABLET    Take 1 tablet (25 mg total) by mouth every 6 (six) hours as needed.   LEVOTHYROXINE (SYNTHROID, LEVOTHROID) 125 MCG TABLET    TAKE 1/2 TABLET BY MOUTH DAILY BEFORE BREAKFAST   LOPERAMIDE (IMODIUM) 2 MG CAPSULE    Take 1 capsule (2 mg total) by mouth as needed for diarrhea or loose stools.   MECLIZINE (ANTIVERT) 25 MG TABLET    Take 1/2-1 tablet by mouth every 6 hours as needed for dizziness   MULTIPLE VITAMIN (MULTIVITAMIN WITH MINERALS) TABS TABLET    Take 1 tablet by mouth daily.   MUPIROCIN OINTMENT (BACTROBAN) 2 %    1 APPLICATION APPLY ON THE SKIN AS DIRECTED APPLY TO AFFECTED AREA ON HAND AND COVER AS DIRECTED   OMEGA-3 FATTY ACIDS (FISH OIL) 1000 MG CAPS    Take 1 capsule by mouth daily.   OMEPRAZOLE (PRILOSEC) 20 MG CAPSULE    Take 20 mg by mouth daily.     ONDANSETRON (ZOFRAN) 8 MG TABLET    Take 1 tablet (8 mg total) by mouth every 6 (six) hours as needed for nausea or vomiting.   POLYETHYL GLYCOL-PROPYL GLYCOL (SYSTANE) 0.4-0.3 % SOLN    Place 1 drop into both  eyes 4 (four) times daily.    POTASSIUM CHLORIDE (KLOR-CON M10) 10 MEQ TABLET    TAKE 2 TABLETS (20 MEQ TOTAL) BY MOUTH DAILY.   PREDNISONE (DELTASONE) 10 MG TABLET    TAKE 1 TABLET (10 MG TOTAL) BY MOUTH DAILY WITH BREAKFAST.   SIMVASTATIN (ZOCOR) 20 MG TABLET    TAKE 1 TABLET (20 MG TOTAL) BY MOUTH DAILY.   THIAMINE 100 MG TABLET    Take 1 tablet (100 mg total) by mouth daily.   TRAMADOL (ULTRAM) 50 MG TABLET    Take 1 tablet (50 mg total) by mouth every 8 (eight) hours as needed.  Modified Medications   No medications on file  Discontinued Medications   No medications on file

## 2017-01-01 ENCOUNTER — Encounter (INDEPENDENT_AMBULATORY_CARE_PROVIDER_SITE_OTHER): Payer: Self-pay | Admitting: Family

## 2017-01-01 ENCOUNTER — Ambulatory Visit (INDEPENDENT_AMBULATORY_CARE_PROVIDER_SITE_OTHER): Payer: Medicare Other | Admitting: Family

## 2017-01-01 VITALS — Ht 63.0 in | Wt 140.0 lb

## 2017-01-01 DIAGNOSIS — S52514A Nondisplaced fracture of right radial styloid process, initial encounter for closed fracture: Secondary | ICD-10-CM

## 2017-01-01 NOTE — Progress Notes (Signed)
Office Visit Note   Patient: [name]           Date of Birth: 1914/10/23           MRN: [id number] Visit Date: 01/01/2017              Requested by: Noralee Space, MD [address] [place], [place] [address] PCP: Noralee Space, MD  Chief Complaint  Patient presents with  . Right Wrist - Follow-up      HPI: Patient is a 83 year old woman seen today in evaluation of right wrist. Sustained a fall on 12/28/16 landed on right hand. Complaining of pain in radial area of right wrist. Was seen in Urgent care and placed in volar splint. Complains of continued pain and tenderness.   Outside notes and radiographs were independently reviewed. The radiographs of the right wrist show lucency over the radial styloid consistent with a nondisplaced fracture this correlates with her exam.  Assessment & Plan: Visit Diagnoses: No diagnosis found.  Plan: We'll place her in a short arm splint. She will wear this for the next 3 weeks. May remove for bathing. Advised that she continue with Tylenol and ice. Does have [place] skin tear to her elbow for which she will continue with nonadherent dressings following antibacterial soap cleansing daily.  Follow-Up Instructions: Return in about 3 weeks (around 01/22/2017).   Right Hand Exam   Tenderness  The patient is experiencing tenderness in the radial area.  Range of Motion  The patient has normal right wrist ROM.   Muscle Strength  Grip: 5/5   Other  Erythema: absent Sensation: normal Pulse: present   Left Hand Exam   Muscle Strength  Grip:  5/5     Skin tear to the elbow this is intact with Dermabond which was applied an urgent care. There is no drainage no erythema no odor no sign of infection  Patient is alert, oriented, no adenopathy, well-dressed, normal affect, normal respiratory effort.   Imaging: No results found.  Labs: Lab Results  Component Value Date   HGBA1C 5.7 (H) 11/24/2014   ESRSEDRATE 15 04/18/2016   ESRSEDRATE 70 Repeated and verified X2. (H) 04/03/2016   ESRSEDRATE 44 (H) 02/16/2016   LABURIC 4.3 08/16/2013   REPTSTATUS 12/01/2014 FINAL 11/24/2014   REPTSTATUS 12/01/2014 FINAL 11/24/2014   CULT  11/24/2014    NO GROWTH 5 DAYS Performed at [place]  11/24/2014    NO GROWTH 5 DAYS Performed at [place] 10/07/2013    Orders:  No orders of the defined types were placed in this encounter.  No orders of the defined types were placed in this encounter.    Procedures: No procedures performed  Clinical Data: No additional findings.  ROS:  All other systems negative, except as noted in the HPI. Review of Systems  Objective: Vital Signs: Ht 5\' 3"  (1.6 m)   Wt 140 lb (63.5 kg)   BMI 24.80 kg/m   Specialty Comments:  No specialty comments available.  PMFS History: Patient Active Problem List   Diagnosis Date Noted  . Need for prophylactic vaccination against Streptococcus pneumoniae (pneumococcus) 06/28/2016  . Dizziness 05/22/2016  . Venous stasis dermatitis of both lower extremities 02/28/2016  . History of cellulitis 04/05/2015  . Cellulitis of right lower extremity 11/24/2014  . Upper respiratory tract infection 10/05/2014  . Cholelithiasis 07/05/2014  . Anemia of chronic disease 06/15/2014  .  Cholecystitis, acute 10/06/2013  . CKD (chronic kidney disease), stage III 10/06/2013  . Gout 04/21/2013  . Cellulitis 08/06/2012  . Vertigo 04/18/2012  . Back pain 01/30/2011  . Aortic valve disorder 01/07/2011  . Edema 10/15/2010  . SHINGLES 04/10/2010  . VITAMIN D DEFICIENCY 11/13/2008  . RENAL INSUFFICIENCY 11/13/2008  . GLAUCOMA 05/09/2008  . COLONIC POLYPS 11/09/2007  . Diverticulosis of large intestine 11/09/2007  . Hypothyroidism 07/08/2007  . HYPERCHOLESTEROLEMIA 07/08/2007  . Anxiety state 07/08/2007  . Essential hypertension 07/08/2007  . Venous (peripheral) insufficiency 07/08/2007  .  ASTHMATIC BRONCHITIS, ACUTE 07/08/2007  . GERD 07/08/2007  . Osteoarthritis 07/08/2007  . PALPITATIONS, HX OF 07/08/2007   Past Medical History:  Diagnosis Date  . Acute bronchitis   . Anxiety state, unspecified   . Benign neoplasm of colon   . CKD (chronic kidney disease), stage III 10/06/2013  . Diverticulosis of colon (without mention of hemorrhage)   . DJD (degenerative joint disease)   . GERD (gastroesophageal reflux disease)   . Glaucoma   . Hypertension   . Hypothyroidism   . Palpitations   . Pure hypercholesterolemia   . Renal insufficiency   . Unspecified venous (peripheral) insufficiency   . Vitamin D deficiency disease     Family History  Problem Relation Age of Onset  . Heart disease Mother   . Cancer Father     Throat  . Heart disease Father     Past Surgical History:  Procedure Laterality Date  . ABDOMINAL HYSTERECTOMY    . BREAST BIOPSY     Benign  . CATARACT EXTRACTION    . ERCP N/A 10/06/2013   Procedure: ENDOSCOPIC RETROGRADE CHOLANGIOPANCREATOGRAPHY (ERCP);  Surgeon: Ladene Artist, MD;  Location: [facility];  Service: Endoscopy;  Laterality: N/A;  . TOE AMPUTATION  08/2009   Right second toe - Dr Beola Cord  . TOTAL KNEE ARTHROPLASTY  1999   right  . TOTAL KNEE ARTHROPLASTY  11/2008   left - Dr Lorin Mercy   Social History   Occupational History  .  Retired   Social History Main Topics  . Smoking status: Never Smoker  . Smokeless tobacco: Never Used  . Alcohol use No  . Drug use: No  . Sexual activity: Not on file

## 2017-01-10 ENCOUNTER — Ambulatory Visit: Payer: Medicare Other | Attending: Neurology | Admitting: Rehabilitative and Restorative Service Providers"

## 2017-01-10 DIAGNOSIS — R2689 Other abnormalities of gait and mobility: Secondary | ICD-10-CM | POA: Insufficient documentation

## 2017-01-10 DIAGNOSIS — R42 Dizziness and giddiness: Secondary | ICD-10-CM | POA: Diagnosis not present

## 2017-01-10 DIAGNOSIS — R2681 Unsteadiness on feet: Secondary | ICD-10-CM

## 2017-01-10 NOTE — Therapy (Signed)
[facility] [address] [place], [place], [address] Phone: [phone]   Fax:  [phone]  Physical Therapy Treatment and Discharge Summary  Patient Details  Name: [name] MRN: [id number] Date of Birth: 05-11-1915 Referring Provider: Metta Clines, DO  Encounter Date: 01/10/2017      PT End of Session - 01/10/17 1436    Visit Number 7   Number of Visits 8   Date for PT Re-Evaluation 12/15/16   Authorization Type G code every 10th visit   PT Start Time 1410   PT Stop Time 1450   PT Time Calculation (min) 40 min   Equipment Utilized During Treatment Gait belt   Activity Tolerance Patient tolerated treatment well   Behavior During Therapy [facility] for tasks assessed/performed      Past Medical History:  Diagnosis Date  . Acute bronchitis   . Anxiety state, unspecified   . Benign neoplasm of colon   . CKD (chronic kidney disease), stage III 10/06/2013  . Diverticulosis of colon (without mention of hemorrhage)   . DJD (degenerative joint disease)   . GERD (gastroesophageal reflux disease)   . Glaucoma   . Hypertension   . Hypothyroidism   . Palpitations   . Pure hypercholesterolemia   . Renal insufficiency   . Unspecified venous (peripheral) insufficiency   . Vitamin D deficiency disease     Past Surgical History:  Procedure Laterality Date  . ABDOMINAL HYSTERECTOMY    . BREAST BIOPSY     Benign  . CATARACT EXTRACTION    . ERCP N/A 10/06/2013   Procedure: ENDOSCOPIC RETROGRADE CHOLANGIOPANCREATOGRAPHY (ERCP);  Surgeon: Ladene Artist, MD;  Location: [place];  Service: Endoscopy;  Laterality: N/A;  . TOE AMPUTATION  08/2009   Right second toe - Dr Beola Cord  . TOTAL KNEE ARTHROPLASTY  1999   right  . TOTAL KNEE ARTHROPLASTY  11/2008   left - Dr Lorin Mercy    There were no vitals filed for this visit.      Subjective Assessment - 01/10/17 1411    Subjective The patient fell within past few weeks.  She  reported she stood up after sitting for awhile and fell.  She is unsure why she fell.   She reports she is exercising by walking and working her feet as much as she can.  She notes she has participated in the exercises ([place] home program) as she can.    Patient is accompained by: Family member  daughter   Patient Stated Goals "I hope I can walk again and not have this feeling."   Currently in Pain? No/denies  saw eye doctor and reports she didn't find out much from the first appointment                         [facility] Adult PT Treatment/Exercise - 01/10/17 1718      Self-Care   Self-Care Other Self-Care Comments   Other Self-Care Comments  Discussed home program and need for ther ex in addition to walking as she reports she walks regularly.  Also discussed f/u with eye doctor as current symptoms appear worsened due to visual deficits.        Gait: Ambulation with RW x 230 ft with modified indep at slowed pace.  Discussed RW height and adjusted RW to correct height.       Balance Exercises - 01/10/17 [place]  Ankle Movements Sitting;10 reps   Knee Extensor 10 reps  no weight   Knee Flexor 10 reps   Hip ABductor 10 reps   Ankle Plantorflexors 20 reps, support   Ankle Dorsiflexors 20 reps, support  patient feels weak after performing, rest 2 minutes   Knee Bends 10 reps, support   Backwards Walking Support   Sideways Walking Assistive device   Tandem Stance 10 seconds, support   One Leg Stand 10 seconds, support   Sit to Stand 5 reps, one support   Overall OTAGO Comments Patient continues to need verbal cues for side stepping to perform correctly.  She fatigues in legs mid way through exercises and needed a rest break.  Therefore, do not think adding further weight or repetition would be beneficial.             PT Short Term Goals - 11/15/16 1609      PT SHORT TERM GOAL #1   Title STGs=LTGs           PT Long Term Goals - 01/10/17  1708      PT LONG TERM GOAL #1   Title The patient will return demo HEP with written cues for habituation for dizziness, and [place] for balance/fall prevention.   Baseline Met on 01/10/17   Time 4   Period Weeks   Status Achieved     PT LONG TERM GOAL #2   Title The patient will improve Berg balance score from 19/56 to > or equal to 25/56 to demo improved steady state balance for ADLs.   Baseline Scores 26/56 on 12/06/16   Time 4   Period Weeks   Status Achieved     PT LONG TERM GOAL #3   Title The patient will improve gait speed from 1.24 ft/sec to > or equal to 1.7 ft/sec to demo improved functional mobility.   Baseline Improved to 1.5 ft/sec.    Time 4   Period Weeks   Status Partially Met     PT LONG TERM GOAL #4   Title The patient will tolerate sit<>sidelying and seated head motion x 5 reps without c/o dizziness.   Baseline Met on 12/06/16.   Time 4   Period Weeks   Status Achieved               Plan - 01/10/17 1709    Clinical Impression Statement The patient was seen today after working on [place] home program for fall prevention x 3-4 weeks.  She was not able to perform exercises regularly due to wrist injury from a fall (notes she stood up and fell).  PT reviewed all exercises and she is safe to perform.  She does not feel further therapy would be beneficial.  Still notes "dizziness" that she describes as off balance and difficulty seeing fine print.    Recommended continue HEP and emphasized need for continued therapy.    PT Treatment/Interventions ADLs/Self Care Home Management;Therapeutic activities;Therapeutic exercise;Balance training;Gait training;Neuromuscular re-education;Functional mobility training;Patient/family education;Canalith Repostioning;Vestibular   PT Next Visit Plan Discharge   Consulted and Agree with Plan of Care Patient      Patient will benefit from skilled therapeutic intervention in order to improve the following deficits and impairments:   Abnormal gait, Decreased balance, Decreased strength, Impaired sensation, Postural dysfunction, Dizziness, Difficulty walking, Impaired vision/preception  Visit Diagnosis: Dizziness and giddiness  Unsteadiness on feet  Other abnormalities of gait and mobility     Problem List Patient Active Problem List  Diagnosis Date Noted  . Need for prophylactic vaccination against Streptococcus pneumoniae (pneumococcus) 06/28/2016  . Dizziness 05/22/2016  . Venous stasis dermatitis of both lower extremities 02/28/2016  . History of cellulitis 04/05/2015  . Cellulitis of right lower extremity 11/24/2014  . Upper respiratory tract infection 10/05/2014  . Cholelithiasis 07/05/2014  . Anemia of chronic disease 06/15/2014  . Cholecystitis, acute 10/06/2013  . CKD (chronic kidney disease), stage III 10/06/2013  . Gout 04/21/2013  . Cellulitis 08/06/2012  . Vertigo 04/18/2012  . Back pain 01/30/2011  . Aortic valve disorder 01/07/2011  . Edema 10/15/2010  . SHINGLES 04/10/2010  . VITAMIN D DEFICIENCY 11/13/2008  . RENAL INSUFFICIENCY 11/13/2008  . GLAUCOMA 05/09/2008  . COLONIC POLYPS 11/09/2007  . Diverticulosis of large intestine 11/09/2007  . Hypothyroidism Aug 03, 2007  . HYPERCHOLESTEROLEMIA 03-Aug-2007  . Anxiety state 08-03-2007  . Essential hypertension 08/03/2007  . Venous (peripheral) insufficiency 03-Aug-2007  . ASTHMATIC BRONCHITIS, ACUTE 08/03/07  . GERD 2007/08/03  . Osteoarthritis 08/03/07  . PALPITATIONS, HX OF 2007-08-03       G-Codes - Feb 05, 2017 1719    Functional Assessment Tool Used (Outpatient Only) Berg=26/56   Functional Limitation Mobility: Walking and moving around   Mobility: Walking and Moving Around Goal Status [phone]) At least 40 percent but less than 60 percent impaired, limited or restricted   Mobility: Walking and Moving Around Discharge Status [phone]) At least 40 percent but less than 60 percent impaired, limited or restricted     PHYSICAL THERAPY  DISCHARGE SUMMARY  Visits from Start of Care: 7  Current functional level related to goals / functional outcomes: See above   Remaining deficits: Fall within past month Visual deficits/ multi-sensory balance impairment Decreased balance Gait deficits   Education / Equipment: Home program, fall risk, need for there ex  Plan: Patient agrees to discharge.  Patient goals were partially met. Patient is being discharged due to meeting the stated rehab goals.  ?????        Thank you for the referral of this patient. Rudell Cobb, MPT   [place], PT 05-Feb-2017, 5:19 PM  [facility] [address] [place], [place], [address] Phone: [phone]   Fax:  [phone]  Name: CEANA FIALA MRN: [id number] Date of Birth: March 07, 1915

## 2017-01-10 NOTE — Addendum Note (Signed)
Addended by: Rudell Cobb M on: 01/10/2017 05:28 PM   Modules accepted: Orders

## 2017-01-13 ENCOUNTER — Ambulatory Visit: Payer: Medicare Other | Admitting: Pulmonary Disease

## 2017-01-22 ENCOUNTER — Ambulatory Visit (INDEPENDENT_AMBULATORY_CARE_PROVIDER_SITE_OTHER): Payer: Medicare Other | Admitting: Orthopedic Surgery

## 2017-01-24 ENCOUNTER — Ambulatory Visit (INDEPENDENT_AMBULATORY_CARE_PROVIDER_SITE_OTHER): Payer: Medicare Other | Admitting: Family

## 2017-01-24 ENCOUNTER — Ambulatory Visit (INDEPENDENT_AMBULATORY_CARE_PROVIDER_SITE_OTHER): Payer: Medicare Other | Admitting: Orthopedic Surgery

## 2017-01-24 ENCOUNTER — Encounter (INDEPENDENT_AMBULATORY_CARE_PROVIDER_SITE_OTHER): Payer: Self-pay | Admitting: Orthopedic Surgery

## 2017-01-24 VITALS — Ht 63.0 in | Wt 140.0 lb

## 2017-01-24 DIAGNOSIS — S52514A Nondisplaced fracture of right radial styloid process, initial encounter for closed fracture: Secondary | ICD-10-CM | POA: Diagnosis not present

## 2017-01-24 NOTE — Progress Notes (Signed)
Office Visit Note   Patient: [name]           Date of Birth: 01/30/1915           MRN: [id number] Visit Date: 01/24/2017              Requested by: Noralee Space, MD [address] [place], [place] [address] PCP: Noralee Space, MD  Chief Complaint  Patient presents with  . Right Wrist - Follow-up    DOI 12/28/16  Right nondisplaced fracture radial styloid process.       HPI: Patient is a 83 year old woman seen in follow-up for right nondisplaced distal radius fracture. Patient also has crepitation arthritis of the right shoulder and right elbow. She states that her right shoulder did not hurt until she had an injection.  Assessment & Plan: Visit Diagnoses:  1. Nondisplaced fracture of right radial styloid process, initial encounter for closed fracture     Plan: Recommend discontinuing the splint use the right upper extremity as tolerated do not feel there is any intervention necessary for the shoulder and elbow at this time. Recommended over-the-counter anti-inflammatories or Tylenol for the shoulder.  Follow-Up Instructions: Return if symptoms worsen or fail to improve.   Ortho Exam  Patient is alert, oriented, no adenopathy, well-dressed, normal affect, normal respiratory effort. Examination patient's of radial styloid is nontender to palpation she has good range of motion of her wrist which is asymptomatic. She has venous stasis changes in both upper extremities but no open wounds. Patient has crepitation with range of motion of the shoulder and elbow.  Imaging: No results found.  Labs: Lab Results  Component Value Date   HGBA1C 5.7 (H) 11/24/2014   ESRSEDRATE 15 04/18/2016   ESRSEDRATE 70 Repeated and verified X2. (H) 04/03/2016   ESRSEDRATE 44 (H) 02/16/2016   LABURIC 4.3 08/16/2013   REPTSTATUS 12/01/2014 FINAL 11/24/2014   REPTSTATUS 12/01/2014 FINAL 11/24/2014   CULT  11/24/2014    NO GROWTH 5 DAYS Performed at [facility]   11/24/2014    NO GROWTH 5 DAYS Performed at [facility] 10/07/2013    Orders:  No orders of the defined types were placed in this encounter.  No orders of the defined types were placed in this encounter.    Procedures: No procedures performed  Clinical Data: No additional findings.  ROS:  All other systems negative, except as noted in the HPI. Review of Systems  Objective: Vital Signs: Ht 5\' 3"  (1.6 m)   Wt 140 lb (63.5 kg)   BMI 24.80 kg/m   Specialty Comments:  No specialty comments available.  PMFS History: Patient Active Problem List   Diagnosis Date Noted  . Need for prophylactic vaccination against Streptococcus pneumoniae (pneumococcus) 06/28/2016  . Dizziness 05/22/2016  . Venous stasis dermatitis of both lower extremities 02/28/2016  . History of cellulitis 04/05/2015  . Cellulitis of right lower extremity 11/24/2014  . Upper respiratory tract infection 10/05/2014  . Cholelithiasis 07/05/2014  . Anemia of chronic disease 06/15/2014  . Cholecystitis, acute 10/06/2013  . CKD (chronic kidney disease), stage III 10/06/2013  . Gout 04/21/2013  . Cellulitis 08/06/2012  . Vertigo 04/18/2012  . Back pain 01/30/2011  . Aortic valve disorder 01/07/2011  . Edema 10/15/2010  . SHINGLES 04/10/2010  . VITAMIN D DEFICIENCY 11/13/2008  . RENAL INSUFFICIENCY 11/13/2008  . GLAUCOMA 05/09/2008  . COLONIC POLYPS 11/09/2007  . Diverticulosis of  large intestine 11/09/2007  . Hypothyroidism 07/08/2007  . HYPERCHOLESTEROLEMIA 07/08/2007  . Anxiety state 07/08/2007  . Essential hypertension 07/08/2007  . Venous (peripheral) insufficiency 07/08/2007  . ASTHMATIC BRONCHITIS, ACUTE 07/08/2007  . GERD 07/08/2007  . Osteoarthritis 07/08/2007  . PALPITATIONS, HX OF 07/08/2007   Past Medical History:  Diagnosis Date  . Acute bronchitis   . Anxiety state, unspecified   . Benign neoplasm of colon   . CKD (chronic kidney disease),  stage III 10/06/2013  . Diverticulosis of colon (without mention of hemorrhage)   . DJD (degenerative joint disease)   . GERD (gastroesophageal reflux disease)   . Glaucoma   . Hypertension   . Hypothyroidism   . Palpitations   . Pure hypercholesterolemia   . Renal insufficiency   . Unspecified venous (peripheral) insufficiency   . Vitamin D deficiency disease     Family History  Problem Relation Age of Onset  . Heart disease Mother   . Cancer Father     Throat  . Heart disease Father     Past Surgical History:  Procedure Laterality Date  . ABDOMINAL HYSTERECTOMY    . BREAST BIOPSY     Benign  . CATARACT EXTRACTION    . ERCP N/A 10/06/2013   Procedure: ENDOSCOPIC RETROGRADE CHOLANGIOPANCREATOGRAPHY (ERCP);  Surgeon: Ladene Artist, MD;  Location: [facility];  Service: Endoscopy;  Laterality: N/A;  . TOE AMPUTATION  08/2009   Right second toe - Dr Beola Cord  . TOTAL KNEE ARTHROPLASTY  1999   right  . TOTAL KNEE ARTHROPLASTY  11/2008   left - Dr Lorin Mercy   Social History   Occupational History  .  Retired   Social History Main Topics  . Smoking status: Never Smoker  . Smokeless tobacco: Never Used  . Alcohol use No  . Drug use: No  . Sexual activity: Not on file

## 2017-01-31 ENCOUNTER — Ambulatory Visit (INDEPENDENT_AMBULATORY_CARE_PROVIDER_SITE_OTHER): Payer: Medicare Other | Admitting: Neurology

## 2017-01-31 ENCOUNTER — Encounter: Payer: Self-pay | Admitting: Neurology

## 2017-01-31 VITALS — Wt 138.0 lb

## 2017-01-31 DIAGNOSIS — R42 Dizziness and giddiness: Secondary | ICD-10-CM

## 2017-01-31 DIAGNOSIS — I951 Orthostatic hypotension: Secondary | ICD-10-CM | POA: Diagnosis not present

## 2017-01-31 NOTE — Patient Instructions (Signed)
I don't think you had a stroke.  I think your blood pressure drops when you stand up.  You need to take care and be careful when standing up.

## 2017-01-31 NOTE — Progress Notes (Signed)
NEUROLOGY FOLLOW UP OFFICE NOTE  [name] [id number]  HISTORY OF PRESENT ILLNESS: [name] is a 83 year old female with HTN, renal insufficiency, venous insufficiency, hypothyroidism, aortic valve disorder and history of asthmatic bronchitis who follows up for dizziness.  She is accompanied by her daughter, who supplements history.  UPDATE: She underwent vestibular rehabilitation, which was ineffective.  She fell in early April, sustaining a right wrist fracture.   HISTORY: Back in August 2017, she got up in the middle of the night.  When she got back to bed, she had an acute episode of spinning.  She cannot recall how long it lasted.  Ever since then, she has had dizziness and difficulty walking.  When she is laying down and sitting, she feels completely fine.  When she stands up or sometimes while walking, she reports dizziness, described as lightheadedness or wooziness, but not spinning.  She is very unsteady on her feet.  She uses a cane.  There has been no associated headache, double vision, facial droop, nausea, or unilateral weakness or numbness.  Symptoms have been unchanged.  Meclizine and Epley maneuver have not been effective.  She did see ENT who found hearing loss in the left ear, but otherwise unremarkable.    For many years, she has worn thigh-high compression stockings for "leg swelling".     She has history of typical BPPV, but this is much different.  PAST MEDICAL HISTORY: Past Medical History:  Diagnosis Date  . Acute bronchitis   . Anxiety state, unspecified   . Benign neoplasm of colon   . CKD (chronic kidney disease), stage III 10/06/2013  . Diverticulosis of colon (without mention of hemorrhage)   . DJD (degenerative joint disease)   . GERD (gastroesophageal reflux disease)   . Glaucoma   . Hypertension   . Hypothyroidism   . Palpitations   . Pure hypercholesterolemia   . Renal insufficiency   . Unspecified venous (peripheral)  insufficiency   . Vitamin D deficiency disease     MEDICATIONS: Current Outpatient Prescriptions on File Prior to Visit  Medication Sig Dispense Refill  . acetaminophen (TYLENOL) 325 MG tablet Take 325 mg by mouth every 4 (four) hours as needed for mild pain or moderate pain.     [name] allopurinol (ZYLOPRIM) 300 MG tablet Take 1 tablet by mouth daily.    [name] ALPRAZolam (XANAX) 0.25 MG tablet TAKE 1/2-1 TABLET BY MOUTH 3 TIMES DAILY AS NEEDED FOR ANXIETY 90 tablet 5  . Ascorbic Acid (VITAMIN C) 500 MG tablet Take 500 mg by mouth daily.      [name] aspirin 81 MG tablet Take 81 mg by mouth daily.      . bimatoprost (LUMIGAN) 0.01 % SOLN Place 1 drop into both eyes at bedtime.    . Cholecalciferol (VITAMIN D) 1000 UNITS capsule Take 1,000 Units by mouth daily.      [name] diltiazem (CARDIZEM CD) 180 MG 24 hr capsule TAKE ONE CAPSULE BY MOUTH EVERY DAY 90 capsule 0  . ferrous sulfate 325 (65 FE) MG tablet Take 1 tablet (325 mg total) by mouth 2 (two) times daily with a meal. 60 tablet 2  . folic acid (FOLVITE) 1 MG tablet Take 1 tablet (1 mg total) by mouth daily. 30 tablet 1  . furosemide (LASIX) 40 MG tablet TAKE 2 TABLETS BY MOUTH EVERY MORNING 60 tablet 5  . hydrOXYzine (ATARAX/VISTARIL) 25 MG tablet Take 1 tablet (25 mg total) by mouth every 6 (six)  hours as needed. 50 tablet 0  . levothyroxine (SYNTHROID, LEVOTHROID) 125 MCG tablet TAKE 1/2 TABLET BY MOUTH DAILY BEFORE BREAKFAST 45 tablet 3  . loperamide (IMODIUM) 2 MG capsule Take 1 capsule (2 mg total) by mouth as needed for diarrhea or loose stools. 10 capsule 0  . Multiple Vitamin (MULTIVITAMIN WITH MINERALS) TABS tablet Take 1 tablet by mouth daily. 30 tablet 0  . mupirocin ointment (BACTROBAN) 2 % 1 APPLICATION APPLY ON THE SKIN AS DIRECTED APPLY TO AFFECTED AREA ON HAND AND COVER AS DIRECTED  0  . Omega-3 Fatty Acids (FISH OIL) 1000 MG CAPS Take 1 capsule by mouth daily.    [name] omeprazole (PRILOSEC) 20 MG capsule Take 20 mg by mouth daily.      .  ondansetron (ZOFRAN) 8 MG tablet Take 1 tablet (8 mg total) by mouth every 6 (six) hours as needed for nausea or vomiting. 20 tablet 0  . Polyethyl Glycol-Propyl Glycol (SYSTANE) 0.4-0.3 % SOLN Place 1 drop into both eyes 4 (four) times daily.     . potassium chloride (KLOR-CON M10) 10 MEQ tablet TAKE 2 TABLETS (20 MEQ TOTAL) BY MOUTH DAILY. 180 tablet 3  . predniSONE (DELTASONE) 10 MG tablet TAKE 1 TABLET (10 MG TOTAL) BY MOUTH DAILY WITH BREAKFAST. 50 tablet 2  . simvastatin (ZOCOR) 20 MG tablet TAKE 1 TABLET (20 MG TOTAL) BY MOUTH DAILY. 90 tablet 3  . thiamine 100 MG tablet Take 1 tablet (100 mg total) by mouth daily. 30 tablet 1  . traMADol (ULTRAM) 50 MG tablet Take 1 tablet (50 mg total) by mouth every 8 (eight) hours as needed. 90 tablet 5   No current facility-administered medications on file prior to visit.     ALLERGIES: Allergies  Allergen Reactions  . Enablex [Darifenacin Hydrobromide Er] Other (See Comments)    Caused her throat and mouth to have bumps and feel like it was swelling  . Clarithromycin Swelling    REACTION: causes her mouth to swell  . Latanoprost Itching    Eye reddness and swelling and itching  . Codeine Nausea Only  . Morphine Nausea And Vomiting  . Sulfonamide Derivatives Other (See Comments)    REACTION: unsure of reaction    FAMILY HISTORY: Family History  Problem Relation Age of Onset  . Heart disease Mother   . Cancer Father        Throat  . Heart disease Father     SOCIAL HISTORY: Social History   Social History  . Marital status: Widowed    Spouse name: N/A  . Number of children: 1  . Years of education: N/A   Occupational History  .  Retired   Social History Main Topics  . Smoking status: Never Smoker  . Smokeless tobacco: Never Used  . Alcohol use No  . Drug use: No  . Sexual activity: Not on file   Other Topics Concern  . Not on file   Social History Narrative  . No narrative on file    REVIEW OF  SYSTEMS: Constitutional: No fevers, chills, or sweats, no generalized fatigue, change in appetite Eyes: No visual changes, double vision, eye pain Ear, nose and throat: No hearing loss, ear pain, nasal congestion, sore throat Cardiovascular: No chest pain, palpitations Respiratory:  No shortness of breath at rest or with exertion, wheezes GastrointestinaI: No nausea, vomiting, diarrhea, abdominal pain, fecal incontinence Genitourinary:  No dysuria, urinary retention or frequency Musculoskeletal:  No neck pain, back pain Integumentary: No rash,  pruritus, skin lesions Neurological: as above Psychiatric: No depression, insomnia, anxiety Endocrine: No palpitations, fatigue, diaphoresis, mood swings, change in appetite, change in weight, increased thirst Hematologic/Lymphatic:  No purpura, petechiae. Allergic/Immunologic: no itchy/runny eyes, nasal congestion, recent allergic reactions, rashes  PHYSICAL EXAM: Orthostatic Vitals (with compression stockings):   Supine:  BP 168/72, Pulse 78 Sitting:  BP 152/68, Pulse 78 Stand:  BP 142/70, Pulse 86. General: No acute distress.  Patient appears well-groomed. Head:  Normocephalic/atraumatic Eyes:  Fundi examined but not visualized Neck: supple, no paraspinal tenderness, full range of motion Heart:  Regular rate and rhythm Lungs:  Clear to auscultation bilaterally Back: No paraspinal tenderness Neurological Exam: alert and oriented to person, place, and time. Attention span and concentration intact, recent and remote memory intact, fund of knowledge intact.  Speech fluent and not dysarthric, language intact.  CN II-XII intact. Bulk and tone normal, muscle strength 5/5 throughout.  Sensation to light touch intact.  Deep tendon reflexes absent throughout, toes downgoing.  Finger to nose testing intact.  Unsteady wide based gait.  Romberg with sway.  IMPRESSION: Dizziness.  Likely related to mild orthostatic hypotension.  I don't think it is a  cerebrovascular etiology.  She is already on thigh-high compression stockings.  I would stress caution when standing and always be using her walker for support.  Unfortunately, I do not have any other suggestions as far as treatment.  25 minutes spent face to face with patient, over 50% spent discussing diagnosis and management.  Metta Clines, DO  CC: Teressa Lower, MD

## 2017-02-18 ENCOUNTER — Other Ambulatory Visit: Payer: Self-pay | Admitting: Pulmonary Disease

## 2017-02-24 ENCOUNTER — Ambulatory Visit: Payer: Medicare Other | Admitting: Neurology

## 2017-02-25 ENCOUNTER — Ambulatory Visit (INDEPENDENT_AMBULATORY_CARE_PROVIDER_SITE_OTHER): Payer: Medicare Other | Admitting: Neurology

## 2017-02-25 ENCOUNTER — Encounter: Payer: Self-pay | Admitting: Neurology

## 2017-02-25 VITALS — BP 122/70 | HR 60 | Ht 63.0 in | Wt 142.0 lb

## 2017-02-25 DIAGNOSIS — R42 Dizziness and giddiness: Secondary | ICD-10-CM | POA: Diagnosis not present

## 2017-02-25 NOTE — Progress Notes (Signed)
NEUROLOGY FOLLOW UP OFFICE NOTE  [name] [id number]  HISTORY OF PRESENT ILLNESS: [name] is a 83 year old female with HTN, renal insufficiency, venous insufficiency, hypothyroidism, aortic valve disorder and history of asthmatic bronchitis who follows up for dizziness.  She is accompanied by her daughter, who supplements history.   UPDATE: She returns to discuss her dizziness again.  There has been no change since last time, but it still is an ongoing issue.   HISTORY: Back in August 2017, she got up in the middle of the night.  When she got back to bed, she had an acute episode of spinning.  She cannot recall how long it lasted.  Ever since then, she has had dizziness and difficulty walking.  When she is laying down and sitting, she feels completely fine.  When she stands up or sometimes while walking, she reports dizziness, described as lightheadedness or wooziness, but not spinning.  She is very unsteady on her feet.  She reports having neuropathy in her feet.  She uses a cane.  There has been no associated headache, double vision, facial droop, nausea, or unilateral weakness or numbness.  Symptoms have been unchanged.  Meclizine and Epley maneuver have not been effective.  She did see ENT who found hearing loss in the left ear, but otherwise unremarkable.  She underwent vestibular rehabilitation, which was ineffective.     She has history of typical BPPV, but this is much different. For many years, she has worn thigh-high compression stockings for "leg swelling".    PAST MEDICAL HISTORY: Past Medical History:  Diagnosis Date  . Acute bronchitis   . Anxiety state, unspecified   . Benign neoplasm of colon   . CKD (chronic kidney disease), stage III 10/06/2013  . Diverticulosis of colon (without mention of hemorrhage)   . DJD (degenerative joint disease)   . GERD (gastroesophageal reflux disease)   . Glaucoma   . Hypertension   . Hypothyroidism   . Palpitations     . Pure hypercholesterolemia   . Renal insufficiency   . Unspecified venous (peripheral) insufficiency   . Vitamin D deficiency disease     MEDICATIONS: Current Outpatient Prescriptions on File Prior to Visit  Medication Sig Dispense Refill  . acetaminophen (TYLENOL) 325 MG tablet Take 325 mg by mouth every 4 (four) hours as needed for mild pain or moderate pain.     [name] allopurinol (ZYLOPRIM) 300 MG tablet Take 1 tablet by mouth daily.    [name] ALPRAZolam (XANAX) 0.25 MG tablet TAKE 1/2-1 TABLET BY MOUTH 3 TIMES DAILY AS NEEDED FOR ANXIETY 90 tablet 5  . Ascorbic Acid (VITAMIN C) 500 MG tablet Take 500 mg by mouth daily.      [name] aspirin 81 MG tablet Take 81 mg by mouth daily.      . bimatoprost (LUMIGAN) 0.01 % SOLN Place 1 drop into both eyes at bedtime.    . Cholecalciferol (VITAMIN D) 1000 UNITS capsule Take 1,000 Units by mouth daily.      [name] diltiazem (CARDIZEM CD) 180 MG 24 hr capsule TAKE ONE CAPSULE BY MOUTH EVERY DAY 90 capsule 0  . ferrous sulfate 325 (65 FE) MG tablet Take 1 tablet (325 mg total) by mouth 2 (two) times daily with a meal. 60 tablet 2  . folic acid (FOLVITE) 1 MG tablet Take 1 tablet (1 mg total) by mouth daily. 30 tablet 1  . furosemide (LASIX) 40 MG tablet TAKE 2 TABLETS BY MOUTH EVERY  MORNING 60 tablet 5  . hydrOXYzine (ATARAX/VISTARIL) 25 MG tablet Take 1 tablet (25 mg total) by mouth every 6 (six) hours as needed. 50 tablet 0  . levothyroxine (SYNTHROID, LEVOTHROID) 125 MCG tablet TAKE 1/2 TABLET BY MOUTH DAILY BEFORE BREAKFAST 45 tablet 3  . loperamide (IMODIUM) 2 MG capsule Take 1 capsule (2 mg total) by mouth as needed for diarrhea or loose stools. 10 capsule 0  . Multiple Vitamin (MULTIVITAMIN WITH MINERALS) TABS tablet Take 1 tablet by mouth daily. 30 tablet 0  . mupirocin ointment (BACTROBAN) 2 % 1 APPLICATION APPLY ON THE SKIN AS DIRECTED APPLY TO AFFECTED AREA ON HAND AND COVER AS DIRECTED  0  . Omega-3 Fatty Acids (FISH OIL) 1000 MG CAPS Take 1 capsule by  mouth daily.    [name] omeprazole (PRILOSEC) 20 MG capsule Take 20 mg by mouth daily.      . ondansetron (ZOFRAN) 8 MG tablet Take 1 tablet (8 mg total) by mouth every 6 (six) hours as needed for nausea or vomiting. 20 tablet 0  . Polyethyl Glycol-Propyl Glycol (SYSTANE) 0.4-0.3 % SOLN Place 1 drop into both eyes 4 (four) times daily.     . potassium chloride (KLOR-CON M10) 10 MEQ tablet TAKE 2 TABLETS (20 MEQ TOTAL) BY MOUTH DAILY. 180 tablet 3  . predniSONE (DELTASONE) 10 MG tablet TAKE 1 TABLET (10 MG TOTAL) BY MOUTH DAILY WITH BREAKFAST. 50 tablet 2  . simvastatin (ZOCOR) 20 MG tablet TAKE 1 TABLET (20 MG TOTAL) BY MOUTH DAILY. 90 tablet 3  . thiamine 100 MG tablet Take 1 tablet (100 mg total) by mouth daily. 30 tablet 1  . traMADol (ULTRAM) 50 MG tablet TAKE 1 TABLET BY MOUTH EVERY 8 HOURS AS NEEDED FOR PAIN 90 tablet 5   No current facility-administered medications on file prior to visit.     ALLERGIES: Allergies  Allergen Reactions  . Enablex [Darifenacin Hydrobromide Er] Other (See Comments)    Caused her throat and mouth to have bumps and feel like it was swelling  . Clarithromycin Swelling    REACTION: causes her mouth to swell  . Latanoprost Itching    Eye reddness and swelling and itching  . Codeine Nausea Only  . Morphine Nausea And Vomiting  . Sulfonamide Derivatives Other (See Comments)    REACTION: unsure of reaction    FAMILY HISTORY: Family History  Problem Relation Age of Onset  . Heart disease Mother   . Cancer Father        Throat  . Heart disease Father     SOCIAL HISTORY: Social History   Social History  . Marital status: Widowed    Spouse name: N/A  . Number of children: 1  . Years of education: N/A   Occupational History  .  Retired   Social History Main Topics  . Smoking status: Never Smoker  . Smokeless tobacco: Never Used  . Alcohol use No  . Drug use: No  . Sexual activity: Not on file   Other Topics Concern  . Not on file   Social  History Narrative  . No narrative on file    REVIEW OF SYSTEMS: Constitutional: No fevers, chills, or sweats, no generalized fatigue, change in appetite Eyes: No visual changes, double vision, eye pain Ear, nose and throat: No hearing loss, ear pain, nasal congestion, sore throat Cardiovascular: No chest pain, palpitations Respiratory:  No shortness of breath at rest or with exertion, wheezes GastrointestinaI: No nausea, vomiting, diarrhea, abdominal pain,  fecal incontinence Genitourinary:  No dysuria, urinary retention or frequency Musculoskeletal:  No neck pain, back pain Integumentary: No rash, pruritus, skin lesions Neurological: as above Psychiatric: No depression, insomnia, anxiety Endocrine: No palpitations, fatigue, diaphoresis, mood swings, change in appetite, change in weight, increased thirst Hematologic/Lymphatic:  No purpura, petechiae. Allergic/Immunologic: no itchy/runny eyes, nasal congestion, recent allergic reactions, rashes  PHYSICAL EXAM: Vitals:   02/25/17 1420  BP: 122/70  Pulse: 60   General: No acute distress.  Patient appears well-groomed.  normal body habitus. Head:  Normocephalic/atraumatic Eyes:  Fundi examined but not visualized Neck: supple, no paraspinal tenderness, full range of motion Heart:  Regular rate and rhythm Lungs:  Clear to auscultation bilaterally Back: No paraspinal tenderness Neurological Exam: alert and oriented to person, place, and time. Attention span and concentration intact, recent and remote memory intact, fund of knowledge intact.  Speech fluent and not dysarthric, language intact.  Decreased hearing in left ear.  Otherwise, CN II-XII intact. Bulk and tone normal, muscle strength 5/5 throughout.  Sensation to light touch, temperature and vibration intact.  Deep tendon reflexes 2+ throughout, toes downgoing.  Finger to nose and heel to shin testing intact.  Gait wide-based, steppage, unsteady.  Too unsteady to check  Romberg.  IMPRESSION: Dizziness. Etiology unclear.  This is not new and symptoms have been ongoing since last summer.  Last time, she was orthostatic but not today.  I suggested that we can check a CT of the head, but I explained it will not likely change management.  Even if it showed evidence of an old cerebellar stroke, it would not help treating the dizziness now.  One thing that Dr. Lenna Gilford may want to try is an SSRI.  Otherwise, I do not have any further suggestions.  She has Xanax.  Benzodiazepine is used to treat dizziness acutely, but given her age and fall risk, I do not recommend it.    16 minutes spent face to face with patient, over 50% spent discussing differential diagnosis, possible testing and treatment options.  Metta Clines, DO  CC:  Teressa Lower, MD

## 2017-02-25 NOTE — Patient Instructions (Signed)
I will discuss you with my colleagues.  If they have other suggestions, I will certainly contact you.

## 2017-02-26 DIAGNOSIS — H35351 Cystoid macular degeneration, right eye: Secondary | ICD-10-CM | POA: Diagnosis not present

## 2017-02-26 DIAGNOSIS — H401131 Primary open-angle glaucoma, bilateral, mild stage: Secondary | ICD-10-CM | POA: Diagnosis not present

## 2017-02-26 DIAGNOSIS — H04123 Dry eye syndrome of bilateral lacrimal glands: Secondary | ICD-10-CM | POA: Diagnosis not present

## 2017-02-26 DIAGNOSIS — Z961 Presence of intraocular lens: Secondary | ICD-10-CM | POA: Diagnosis not present

## 2017-03-12 DIAGNOSIS — H18421 Band keratopathy, right eye: Secondary | ICD-10-CM | POA: Diagnosis not present

## 2017-03-12 DIAGNOSIS — H179 Unspecified corneal scar and opacity: Secondary | ICD-10-CM | POA: Diagnosis not present

## 2017-03-12 DIAGNOSIS — H353121 Nonexudative age-related macular degeneration, left eye, early dry stage: Secondary | ICD-10-CM | POA: Diagnosis not present

## 2017-03-12 DIAGNOSIS — H353111 Nonexudative age-related macular degeneration, right eye, early dry stage: Secondary | ICD-10-CM | POA: Diagnosis not present

## 2017-03-31 ENCOUNTER — Other Ambulatory Visit: Payer: Self-pay | Admitting: Pulmonary Disease

## 2017-04-18 DIAGNOSIS — H401131 Primary open-angle glaucoma, bilateral, mild stage: Secondary | ICD-10-CM | POA: Diagnosis not present

## 2017-04-18 DIAGNOSIS — H35351 Cystoid macular degeneration, right eye: Secondary | ICD-10-CM | POA: Diagnosis not present

## 2017-04-18 DIAGNOSIS — Z961 Presence of intraocular lens: Secondary | ICD-10-CM | POA: Diagnosis not present

## 2017-05-05 ENCOUNTER — Ambulatory Visit (INDEPENDENT_AMBULATORY_CARE_PROVIDER_SITE_OTHER): Payer: Medicare Other | Admitting: Pulmonary Disease

## 2017-05-05 ENCOUNTER — Other Ambulatory Visit (INDEPENDENT_AMBULATORY_CARE_PROVIDER_SITE_OTHER): Payer: Medicare Other

## 2017-05-05 ENCOUNTER — Encounter: Payer: Self-pay | Admitting: Pulmonary Disease

## 2017-05-05 VITALS — BP 130/60 | HR 70 | Temp 98.3°F | Ht 63.0 in | Wt 135.1 lb

## 2017-05-05 DIAGNOSIS — E039 Hypothyroidism, unspecified: Secondary | ICD-10-CM

## 2017-05-05 DIAGNOSIS — D638 Anemia in other chronic diseases classified elsewhere: Secondary | ICD-10-CM | POA: Diagnosis not present

## 2017-05-05 DIAGNOSIS — K219 Gastro-esophageal reflux disease without esophagitis: Secondary | ICD-10-CM | POA: Diagnosis not present

## 2017-05-05 DIAGNOSIS — F411 Generalized anxiety disorder: Secondary | ICD-10-CM

## 2017-05-05 DIAGNOSIS — R42 Dizziness and giddiness: Secondary | ICD-10-CM | POA: Diagnosis not present

## 2017-05-05 DIAGNOSIS — E559 Vitamin D deficiency, unspecified: Secondary | ICD-10-CM

## 2017-05-05 DIAGNOSIS — I872 Venous insufficiency (chronic) (peripheral): Secondary | ICD-10-CM

## 2017-05-05 DIAGNOSIS — I10 Essential (primary) hypertension: Secondary | ICD-10-CM

## 2017-05-05 DIAGNOSIS — K5730 Diverticulosis of large intestine without perforation or abscess without bleeding: Secondary | ICD-10-CM | POA: Diagnosis not present

## 2017-05-05 DIAGNOSIS — M150 Primary generalized (osteo)arthritis: Secondary | ICD-10-CM

## 2017-05-05 DIAGNOSIS — R609 Edema, unspecified: Secondary | ICD-10-CM

## 2017-05-05 DIAGNOSIS — N183 Chronic kidney disease, stage 3 unspecified: Secondary | ICD-10-CM

## 2017-05-05 DIAGNOSIS — I359 Nonrheumatic aortic valve disorder, unspecified: Secondary | ICD-10-CM

## 2017-05-05 DIAGNOSIS — M159 Polyosteoarthritis, unspecified: Secondary | ICD-10-CM

## 2017-05-05 LAB — CBC WITH DIFFERENTIAL/PLATELET
BASOS ABS: 0 10*3/uL (ref 0.0–0.1)
Basophils Relative: 0.2 % (ref 0.0–3.0)
EOS ABS: 0 10*3/uL (ref 0.0–0.7)
Eosinophils Relative: 0.2 % (ref 0.0–5.0)
HCT: 41.3 % (ref 36.0–46.0)
Hemoglobin: 13.4 g/dL (ref 12.0–15.0)
Lymphocytes Relative: 15.3 % (ref 12.0–46.0)
Lymphs Abs: 1.4 10*3/uL (ref 0.7–4.0)
MCHC: 32.4 g/dL (ref 30.0–36.0)
MCV: 94.2 fl (ref 78.0–100.0)
MONO ABS: 0.3 10*3/uL (ref 0.1–1.0)
Monocytes Relative: 3.5 % (ref 3.0–12.0)
NEUTROS ABS: 7.6 10*3/uL (ref 1.4–7.7)
NEUTROS PCT: 80.8 % — AB (ref 43.0–77.0)
PLATELETS: 265 10*3/uL (ref 150.0–400.0)
RBC: 4.39 Mil/uL (ref 3.87–5.11)
RDW: 15 % (ref 11.5–15.5)
WBC: 9.5 10*3/uL (ref 4.0–10.5)

## 2017-05-05 LAB — COMPREHENSIVE METABOLIC PANEL
ALK PHOS: 50 U/L (ref 39–117)
ALT: 11 U/L (ref 0–35)
AST: 11 U/L (ref 0–37)
Albumin: 4.1 g/dL (ref 3.5–5.2)
BUN: 39 mg/dL — AB (ref 6–23)
CALCIUM: 9.5 mg/dL (ref 8.4–10.5)
CO2: 31 mEq/L (ref 19–32)
CREATININE: 1.43 mg/dL — AB (ref 0.40–1.20)
Chloride: 99 mEq/L (ref 96–112)
GFR: 35.84 mL/min — AB (ref >60.00)
Glucose, Bld: 105 mg/dL — ABNORMAL HIGH (ref 70–99)
POTASSIUM: 4.4 meq/L (ref 3.5–5.1)
Sodium: 138 mEq/L (ref 135–145)
TOTAL PROTEIN: 6.5 g/dL (ref 6.0–8.3)
Total Bilirubin: 0.4 mg/dL (ref 0.2–1.2)

## 2017-05-05 LAB — TSH: TSH: 3.04 u[IU]/mL (ref 0.35–4.50)

## 2017-05-05 LAB — VITAMIN D 25 HYDROXY (VIT D DEFICIENCY, FRACTURES): VITD: 60.57 ng/mL (ref 30.00–100.00)

## 2017-05-05 NOTE — Patient Instructions (Signed)
Today we updated your med list in our EPIC system...    Continue your current medications the same...  We wrote for a rolling walker w/ seat...    Please be careful-- no falling allowed !!!  Today we did your follow up blood work...    We will contact you w/ the results when available...   Call for any questions...  Let's plan a follow up visit in 4-46mo, sooner if needed for problems.Marland KitchenMarland Kitchen

## 2017-05-05 NOTE — Progress Notes (Signed)
Subjective:    Patient ID: [name], female    DOB: Aug 15, 1915, 83 y.o.   MRN: [id number]  HPI 83 y/o WF here for a follow up visit... she has mult med problems including:  Hx asthmatic bronchitis;  HBP;  Ven Insuffic & edema;  Hyperchol;  Hypothyroid;  GERD/ Divertics/ Colon polyps;  Renal insuffic;  DJD;  Vit D defic;  Anxiety...  ~  SEE PREV EPIC NOTES FOR OLDER DATA >>    LABS 11/14:  Chems- ok w/ BS=108, Cr=1.3;  Uric=4.3 on Allopurinol100;  CBC- ok w/ Hg=12.6.Marland KitchenMarland Kitchen  December 15, 2013:  62moROV & [name] - 10[facility]/13 w/ RUQ pain & dx w/ gallstones, cholecystitis, & had ERCP w/ sphincterotomy (DrStark) & stone extraction; treated w/ Cipro, LFTs improved, course complicated by LE cellulitis requiring addition of Vanco IV; she had f/u DrCornett, C[facility] 2/15> brief note, did not rec elective surg given her age...  June 14, 2014:  [facility] by [facility] w/ sudden fever to 103, chills, and left leg pain/ redness/ swelling=> c/w severe cellulitis; she was felt to be septic but no lesions on leg, no areas of broken skin, wearing support hose, her Lasix dose was cut from '80mg'$ Qam to '40mg'$ /d, other meds the same...   CXR 9/15 showed borderline cardiomeg, underlying chr lung dis, left base atx vs effusion, ?early RUL opac...   EKG 9/15 showed NSR w/ PACs, rate92, NSSTTWA, NAD...  MRI of left leg 9/15> diffuse SQ soft tissue swelling & edema, no discrete abscess identified, no osteomyelitis, no signs of septic arthritis, etc (c/w severe cellulitis).  Ven Dopplers 9/15> no evid of DVT or superficial thrombosis in left leg, enlarged LN's noted in left groin  LABS 9/15 in Hosp> Hg=10-11 range, WBC=29K=>11.7 by disch, BUN/Cr= 39/1.3 improved to 27/0.9 by disch, MRSA=neg, UA=clear...  LABS 9/15 in office f/u>  Chems- ok x Na=131, Cr=1.4;  CBC- ok x   CT Abd&Pelvis 9/15> showed no acute abnormality & no venous obstruction or lymphocele to suggest a cause for left leg  swelling; Cholelithiasis; unchanged bilateral adnexal cystic lesions (probably cysts); unchanged renal cysts and left renal atrophy; unchanged left perianal lipoma...  CXR 10/15 showed clearing of patchy RUL opac & left effusion, lungs now clear & back to baseline, NAD...  LABS 101/15:  Chems- ok x Na=132, BUN=33, Cr=1.3;  CBC- ok w/ Hg=12.0, WBC=7.6;  Sed=54 (improved from 90)...  ~  December 06, 2014:  285moOV & post hospital check> [place] [facility]/3 - 11/28/14 by [facility] w/ right leg cellulitis- states she was out shopping & "over-did it", felt weak & tired then developed right leg discomfort, increased swelling/ redness/ etc; WBC was 21K, VenDopplers were neg for DVT, treated w/ Zosyn/ Vanco & changed to oral Doxy at disch;  She notes that her leg is improved- less swollen, less red, less painful but still sl tender; she notes that right leg first started giving her trouble after the birth of her last child; she had thorough vasc eval by DrEarly in the past and no surgery indicated; she knows to elim salt/sodium, elev legs, wear support hose; she has finished the Doxy100Bid given at disch & she misunderstood the DC directions and has been taking her prev Lasix'80mg'$  Qam... We reviewed the following medical problems during today's office visit >> NOTE: as an afterthought she noted vag itching & wanted Rx- offered Diflucan orally & if not improved she will need Gyn eval (she  has Atarax as [name] on her med list)...     HBP> on Diltiazem180, Furosemide 19mQam, & off KCl since disch;  BP=134/62 today & she denies CP, palpit, dizzy, ch in SOB, etc...    VI, Edema, Cellulitis> she knows to elim sodium, elevate, wear support hose; still taking Lasix80Qam; Hosp 3/16 as above & disch on Doxy- now out & right leg improved she says; RLE chr larger than LLE, sl red/ tender; Rec decr Lasix40.     CHOL> on Simva20 Qhs + low fat diet; last FLP 5/13 showed TChol 191, TG 81, HDL 82, LDL 93... Needs to ret fasting for f/u  blood work.    Hypothy> on Synthroid 1224m- 1/2 tab daily; labs 3/16 showed TSH= 1.21    Renal Insuffic> Creat prev up to 1.8 on the Lasix 8014mCreat 3/16= 1.52=>1.12    Derm> she has Psoriasis on her arms & has been evaluated by DrJordan/Jones; they also treated the LE cellulitis; Rec Atarax prn itching...    Anemia> Hosp 1/15 w/ gallstones and cholecystits (no surg); Hg= 7.5-9.4 at that time & Hg improved to 12.7; labs 3/16 showed Hg= 13.2=>10.6, Fe=13 (7%sat), Ferritin=104, on FeSO4... We reviewed prob list, meds, xrays and labs> see below for updates >>   CXR 3/16 showed norm heart size, mild left base atx, otherw OK...   EKG 3/16 showed NSR, rate86, PACs, poor r prog V1-2, NAD... Marland KitchenMarland KitchenVen Dopplers 3/16 were neg for DVT...   2DEcho 3/16 showed mild LVH w/ mild focal basal septal hypertrophy, norm LVF w/ EF=60-65%, norm wall motion, Gr1DD, norm AoV, mild MR, mild LA dil, norm RV, PAsys=38...  LABS 3.16:  Chems- ok x BUN=37 due to Lasix80 she was on & we decr her to 50m74m CBC- wnl,  Sed=65;  PCT<0.10 indicating +inflamm but no infection now...  PLAN>> we decided to check f/u labs (BMet, CBC, Sed, PCT); Rec to apply cool compresses to right leg several times daily, elim salt/sodium, elev legs, wear support hose; recurrent cellulitis episodes may require suppressive antibiotic therapy... Labs ret w/ BUN=37 7 we decr her Lasix from 80=>40/d; elev Sed w/ neg PCT & we are holding any further antibiotics for now...  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 We reviewed the following medical problems during today's office visit >>     HBP> on Diltiazem180, Furosemide 50mgQ71m& KCl Bid;  BP=120/70  today & she denies CP, palpit, dizzy, ch in SOB, etc...    VI, Edema, Cellulitis> she knows to elim sodium, elevate, wear support hose; still taking Lasix40Qam; Hosp 3/16 as above & disch on Doxy- right leg improved she says; RLE chr larger than LLE, sl red/ tender; Rec continue Lasix40.     CHOL> on Simva20 Qhs + low fat diet; last FLP 5/13 showed TChol 191, TG 81, HDL 82, LDL 93... Needs to ret fasting for f/u blood work.    Hypothy> on Synthroid 125mcg-59m tab daily; labs 3/16 showed TSH= 1.21    Renal Insuffic> Creat prev up to 1.8 on Lasix 80mg; C61m 3/16= 1.52=>1.12; Labs 7/16 stable w/ BUN=35, Cr=1.13    Hx severe DJD, Gout w/ tophi, s/p bilat  TKRs> prev followed by Ladene Artist; on Allopurinol, Tramadol, Tylenol...    Derm> she has Psoriasis on her arms & has been evaluated by DrJordan/Jones; they also treated the LE cellulitis; Rec Atarax prn itching...    Anemia> Hosp 1/15 w/ gallstones and cholecystits (no surg); Hg= 7.5-9.4 at that time & Hg improved to 12.7; labs 7/16 showed Hg= 12.1, Fe=102 (32%sat), Ferritin=116, on FeSO4/d... We reviewed prob list, meds, xrays and labs> see below for updates >>   LABS 7/16:  Chems- ok w/ BUN=35, Cr=1.13, BS=100, BNP=268;  CBC- ok w/ Hg=12.1, Fe=102 (32%), Ferritin=116...  ~  August 07, 2015:  32moROV & post-ER follow up visit> [place] c/o HA rated 8/10> frontal HA, assoc n/v, felt weak & unsteady; BP was ok, VS were stable, Labs ok, CT Head was neg- wnl; Tylenol helped... we reviewed the following medical problems during today's office visit >>     HBP> on Diltiazem180, Furosemide 422mam, & KCl Bid;  BP=124/60 today & she denies CP, palpit, dizzy, ch in SOB, etc...    VI, Edema, Hx cellulitis> she knows to elim sodium, elevate, wear support hose; on Lasix40Qam; [facility] on Doxy- right leg improved; RLE chr larger than LLE, sl red/ tender; Rec continue Lasix40.     CHOL> on Simva20 Qhs + low fat diet; last FLP 5/13  showed TChol 191, TG 81, HDL 82, LDL 93... Needs to ret fasting for f/u blood work.    Hypothy> on Synthroid 12579m 1/2 tab daily; labs 3/16 showed TSH= 1.21    Renal Insuffic> Creat prev up to 1.8 on Lasix 38m75mreat 3/16= 1.52=>1.12; Labs 7/16 stable w/ BUN=35, Cr=1.13    Hx severe DJD, Gout w/ tophi, s/p bilat TKRs> prev followed by DrDeveshwar; on Allopurinol, Tramadol, Tylenol...    Derm> she has Psoriasis on her arms & has been evaluated by DrJordan/Jones; they also treated the LE cellulitis; Rec Atarax prn itching...    Anemia> Hosp 1/15 w/ gallstones and cholecystits (no surg); Hg= 7.5-9.4 at that time & Hg improved to 12.7; labs 7/16 showed Hg= 12.1, Fe=102 (32%sat), Ferritin=116, on FeSO4/d...    Anxiety>  On Xanax 0.25- 1/2 to 1 tab tid prn... EXAM shows Afeb, VSS, O2sat=96% on RA:  Heent- neg, mallampati1;  Chest- clear w/o w/r/r;  Heart- RR gr1/6SEM w/o r/g;  Abd- soft, nontender, neg;  Ext- VI, 1+edema, ecchymoses;  Neuro- no focal neuro deficits...   CT Head 07/20/15>  Intact, WNL, NAD...  Marland KitchenMarland KitchenG 07/20/15 showed NSR, rate81, poor R prog V1-2, NAD...  Labs 06/2015>  Chems- wnl;  CBC- wnl... IMP/PLAN>>  OK Flu vaccine today;  Continue same meds....  ~  February 28, 2016:  70mo 870mo& [name] was seen by MW 5/[facility]/17 w/ ?cellulitis right leg, she had been seeing Derm & Rx w/ creams but no better; she was Afeb, WBC=8.4K, Sed=44; he prescribed Keflex but no better she says & pt has followed up w/ Derm=> legs wrapped Qwk now, swelling worse, wt up 7# to 142# today => this time Rx w/ Pred10, Atarax, incr Lasix80 & no salt...    HBP> on Diltiazem180, Furosemide 40mgQ72m& KCl 10Bid;  BP=120/60 today & she denies CP, palpit, dizzy, ch in SOB, etc...    VI, Edema, Hx cellulitis> she knows to elim sodium, elevate, wear support hose; on Lasix40Qam; Hosp 3/16 & disch on Doxy; Hx cellulitis & non-infection inflammation treated w/ diff antibiotics and Pred, Lasix, no salt, wraps &  creams from [facility] Merritts,  ER visits, and [place]...    CHOL> on Simva20 Qhs + low fat diet; last FLP 5/13 showed TChol 191, TG 81, HDL 82, LDL 93... Needs to ret fasting for f/u blood work.    Hypothy> on Synthroid 167mg- 1/2 tab daily; labs 3/16 showed TSH= 1.21    Renal Insuffic> Creat prev up to 1.8 on Lasix 879m Creat 3/16= 1.52=>1.12; Labs 2016 stable w/ BUN=23-37, Cr=1.13-1.28; Labs 5/17 showed BUN=25, Cr=1.33    Hx severe DJD, Gout w/ tophi, s/p bilat TKRs> prev followed by DrDeveshwar; on Allopurinol, Tramadol, Tylenol...    Derm> she has Psoriasis on her arms & has been evaluated by DrJordan/Jones; they also treated the LE cellulitis/ inflamm w/ wraps and creams, Atarax prn itching...    Anemia> Hosp 1/15 w/ gallstones and cholecystits (no surg); Hg= 7.5-9.4 at that time & Hg improved to 12.7; labs 7/16 showed Hg= 12.1, Fe=102 (32%sat), Ferritin=116, on FeSO4/d; labs 5/16 showed Hg=11.8    Anxiety>  On Xanax 0.25- 1/2 to 1 tab tid prn... EXAM shows Afeb, VSS, O2sat=98% on RA:  Heent- neg, mallampati1;  Chest- decr BS w/o w/r/r;  Heart- RR gr1/6SEM w/o r/g;  Abd- soft, nontender, neg;  Ext- VI, +edema to above knees, leg wrapped;  Neuro- intact... IMP/PLAN>>  Not better w/ Keflex, looks more inflammed than infected=> Rx w/ Pred10, Atarax25 prn, incr Lasix40-2Qam & no salt, ROV in 2wks...   ~  April 03, 2016:  [place] c/o chest congestion, wheezing, cough, sl yellow sput, ?low grade fever; had already been to UCEstellinex; CXR showed diffuse peribronchial cuffing, no airsp dis; given Augmentin, bump Pred, Mucinex;  She states "I was never that sick before" but breathing is fine now & she denies CP/ change is SOB/ cough/ sputum, f/c/s, etc;  But while her breathing is improved- her left leg is swollen & painful again- NOTE: she ran out of Pred 1042m 2 weeks ago!  We decided to check labs>  Chems- ok x BUN=27, Cr=1.51;  CBC- anemic w/ Hg=9.9, MCV=98, WBC=7.8; Sed=70 (up from 44);  Rx w/ Lasix80  & Pred10m63magain; ROV in 2-3wks and continue follow up w/ Derm...  CXR 03/13/16 showed norm heart size, atherosclerotic Ao, mild peribronch cuffing, no edema, clear- NAD, kyphosis...  ~  April 18, 2016:  2wk [place] legs are much improved, decr swelling, wt down 5# more to 129# today; skin is improved w/ the Pred10- less red, no open areas, no weeping, no cellulitis, no f/c/s;  She went back to [employer] wraps used just topical cream which didn't seem to help much previously;  She spent a lot of the visit talking about her glaucoma- [facility]Fleishman at [facility] all he can do, [place] wanted me to know that she is back on eye drops now;  We rechecked labs today>  Chems- ok w/ BUN=33, Cr=1.28;  TSH=1.42;  Sed=15 now on Pred 10mg84m.     EXAM shows Afeb, VSS, O2sat=100% on RA:  Heent- neg, mallampati1;  Chest- decr BS w/o w/r/r;  Heart- RR gr1/6SEM w/o r/g;  Abd- soft, nontender, neg;  Ext- VI, much improved w/ decr edema & erythema;  Neuro- intact... IMP/PLAN>>  Rec to contuinue low sodium diet & the Lasix40- 2tabs Qam;  OK to wean the Pred 10mg 34m to 1/2 tab Qam & stay on  this!  ROV in 1 month...  ~  May 22, 2016:  68moROV & [name]'s CC is intermittent dizziness, using Meclizine w/ some improvement, fell onto her bed one time- reminded to change positions slowly, careful w/ ambulation & may need cane or walker if not stable;  [facility] for an eval that she saw in the newspaper recently...  We reviewed the following medical problems during today's office visit >>     HBP> on Diltiazem180, Furosemide '40mg'$ Qam, & KCl 10Bid;  BP=120/60 today & she denies CP, palpit, dizzy, ch in SOB, etc...    VI, Edema, Hx cellulitis> she knows to elim sodium, elevate, wear support hose; on Lasix40-2Qam & PRED10-1[facility]2Qam[facility]; Hx cellulitis & non-infection inflammation treated w/ diff  antibiotics and Pred, Lasix, no salt, wraps & creams from Derm DrJordan, ER visits, and us=> now improved on tapering course of Pred- rec to continue '5mg'$ /d for now (Sed=15)...     CHOL> off Simva20 & on low fat diet; last FLP 5/13 showed TChol 191, TG 81, HDL 82, LDL 93... Needs to ret fasting for f/u blood work but she does not want Chol meds!    Hypothy> on Synthroid 1273m-1/2 tab daily; labs 7/17 showed TSH= 1.42 & rec to continue same...    Renal Insuffic> Creat prev up to 1.8 on Lasix '80mg'$ ; Creat 3/16= 1.52=>1.12; Labs 2016 stable w/ BUN=23-37, Cr=1.13-1.28; Labs 5-7/17 showed BUN=25-33, Cr=1.28-1.33 (stable on Lasix40-2Qam).    Hx severe DJD, Gout w/ tophi, s/p bilat TKRs> prev followed by DrDeveshwar; on Allopurinol300, Tramadol50, Tylenol...    Derm> she has Psoriasis on her arms & has been evaluated by DrJordan/Jones; they also treated the LE cellulitis/ inflamm w/ wraps and creams, Atarax prn itching...    Anemia> Hosp 1/15 w/ gallstones and cholecystits (no surg); Hg= 7.5-9.4 at that time & Hg improved to 12.7; labs 7/16 showed Hg= 12.1, Fe=102 (32%sat), Ferritin=116, on FeSO4; labs 5-7/17 showed Hg=11.8=>9.9    Anxiety>  On Xanax 0.25- 1/2 to 1 tab tid prn... EXAM shows Afeb, VSS, O2sat=100% on RA: Wt= 131#; Heent- neg, mallampati1; Chest- decr BS w/o w/r/r; Heart- RR gr1/6SEM w/o r/g; Abd- soft, nontender, neg; Ext- VI, much improved w/ decr edema & erythema; Neuro- intact... IMP/PLAN>>  We discussed the poss need for further eval of the dizziness but for now she would like to continue the Meclizine prn & try the epley maneuvers (reviewed w/ pt & hand-out given); she knows to be very careful w/ changing positions and ambulation;  She will continue the Pred '5mg'$  Qam & her Lasix4-=2Qam, avoid sodium, rov 58m103mo   ~  July 15, 2016:  58mo57mo & [name] reports that the AntiOconeeeuvers have helped her dizziness some; she has had a lot of [facility] w/ her [facility] to the eye drops & they are adjusting (she feels this is responsible for some of her dizziness);  She has mult somatic complaints- insomnia (rec melatonin/ benedryl), sneezing (rec zyrtek/ claritin), forgetfulness (she is 89 y5 & still pretty sharp)...     BP is controlled on her Diltiazem180, Furosemide40, K10Bid; BP=110/68 & she remains asymptomatic x the dizziness...    Weight is stable & legs look good on her Lasix, low sodium diet, & the Pred5/d...    Renal is stable w/ last Cr=1.28 on 03/2016...Marland KitchenMarland Kitchen  Hg is stable w/ last CBC 03/2016 showing Hg~10... EXAM shows Afeb, VSS, O2sat=98% on RA: Wt= 130#, stable; Heent- neg, mallampati1; Chest- decr BS w/o w/r/r; Heart- RR gr1/6SEM w/o r/g; Abd- soft, nontender, neg; Ext- VI, much improved w/ decr edema & erythema... IMP/PLAN>>  We reviewed her medical hx & discussed each concerning symptom individually to her satisfaction; we decided to continue the current meds the same & recheck pt in ~57mo..  ~ October 15, 2016:  3367moOV & [name] has continued to complain about intermittent dizziness despite the Antivert & Epley maneuvers;  She was eval by ENT- DrCNewman & she tells me "it's not vertigo" & she has an appt w/ Neuro sched for further eval (we do not yet have the ENT note to review & we've called for this data);  She says she is fearful of falling & rec to use cane/ walker/ etc;  She further reports a bad experience at Rheum office... We reviewed the following medical problems during today's office visit >>     HBP> on ASA81, Diltiazem180, Furosemide '40mg'$ Qam, & KCl 10Bid;  BP=136/64 today & she denies CP, palpit, ch in SOB, etc... She has persistent dizziness & awaiting eval by Neuro.    VI, Edema, Hx cellulitis> she knows to elim sodium, elevate, wear support hose; on Lasix40Qam & PRED5Qam; Hosp 3/16 & disch on Doxy; Hx cellulitis & non-infection inflammation treated w/ diff antibiotics and Pred,  Lasix, no salt, wraps & creams from Derm DrJordan, ER visits, and us=> now improved on tapering course of Pred- rec to continue '5mg'$ /d for now (Sed=15)...     CHOL> off Simva20 & on low fat diet; last FLP 5/13 showed TChol 191, TG 81, HDL 82, LDL 93... Needs to ret fasting for f/u blood work but she does not want Chol meds!    Hypothy> on Synthroid 12581m1/2 tab daily; labs 7/17 showed TSH= 1.42 & rec to continue same...    Renal Insuffic> Creat prev up to 1.8 on Lasix '80mg'$ ; Creat 3/16= 1.52=>1.12; Labs 2016 stable w/ BUN=23-37, Cr=1.13-1.28; Labs 5-7/17 showed BUN=25-33, Cr=1.28-1.33 (stable on Lasix40Qam).    Hx severe DJD, Gout w/ tophi, s/p bilat TKRs> prev followed by DrDeveshwar; on Allopurinol300, Tramadol50, Tylenol...    Derm> she has Psoriasis on her arms & has been evaluated by DrJordan/Jones; they also treated the LE cellulitis/ inflamm w/ wraps and creams, Atarax prn itching...    Anemia> Hosp 1/15 w/ gallstones and cholecystits (no surg); Hg= 7.5-9.4 at that time & Hg improved to 12.7; labs 7/16 showed Hg= 12.1, Fe=102 (32%sat), Ferritin=116, on FeSO4; labs 5-7/17 showed Hg=11.8=>9.9    Anxiety>  On Xanax 0.25- 1/2 to 1 tab tid prn... EXAM shows Afeb, VSS- no postural BP change, O2sat=100% on RA: Wt= 131#; Heent- neg, mallampati1; Chest- decr BS w/o w/r/r; Heart- RR gr1/6SEM w/o r/g; Abd- soft, nontender, neg; Ext- VI, much improved w/ decr edema & erythema; Neuro- intact... IMP/PLAN>>  [name] [age] w/ mult somatic complaints and prob list as above; we reviewed her medical issues and the meds used to treat them; her persistent complaint is dizziness & eval by ENT neg, eval by Neuro is pending; we reviewed her med- continue same; we will continue Q67mo24moits... Note: >50% of the 25mi69msit today was spent in counseling and coordination of care...  ~  December 31, 2016:  67mo R18mo [name] will turn 89 next month!  She presents for an add-on visit after a fall w/ injury to  right wrist,  she called the local fire dept due to a right elbow skin tear- went to the ER where they "glued it", took an XRay (neg for fx) & rec that she f/u w/ Ortho- DrYates...    When she was here last Jan2018> she was c/o persistent dizziness, no real benefit from antivert or epley maneuvers, and we referred her to Neuro;  She saw [facility] 10/31/16> he did not rec any further testing/ imaging & she says he indicated it was prob a stroke + neuropathy sent her to Neuro Rehab (finished 12/06/16) w/ some objective improvement but no real subjective improvement;  She is also having some issues w/ her eyes & is seeing DrGroat for this currently (prev followed [facility] then [place] [facility]=> now in [place]);  "I'm falling apart" she says...    EXAM shows Afeb, VSS- no postural BP change, O2sat=100% on RA: Wt= 140#; Heent- neg, mallampati1; Chest- decr BS w/o w/r/r; Heart- RR gr1/6SEM w/o r/g; Abd- soft, nontender, neg; Ext- VI, much improved w/ decr edema & erythema; Neuro- intact...  XRay right elbow 12/29/15>  No fx etc, NEG XRAy...  XRay right wrist 12/28/16>  Diffuse osteopenia, degen changes, lucency over radial styloid is likely artifact... IMP/PLAN>>  Elbow skin laceration checked & dressed; rec to f/u w/ Ortho regarding a better wrist brace; continue same meds...   ~  May 05, 2017:  83moROV and [place] 100th BIR[place] 03/17/1915...              Problem List:  GLAUCOMA (ICD-365.9) - hx mult eye problems w/ prev corneal transplant & glaucoma laser eye surg... [facility]  ASTHMATIC BRONCHITIS, ACUTE (ICD-466.0) - Hx AB requiring Rx w/ Depo/ Pred/ Mucinex/ Tussionex... ~  CXR 4/11 showed vague nodular densities on left... ~  [facility]  [facility], [facility] dx w/ pneumonia rx w/ ZPak... ~  CXR 3/12 showed borderline cardiomeg, nodular opac left base, NAD... ~  Spring 2013: she notes some allergy symptoms... ~  CXR 1/15 showed mild cardiomeg,  atherosclerotic & tortuous Ao, low lung vol & sl incr markings, NAD..[name]  ~  CXR 9/15 showed borderline cardiomeg, underlying chr lung dis, left base atx vs effusion, ?early RUL opac... ~  CXR 10/15 showed clearing of patchy RUL opac & left effusion, lungs now clear & back to baseline, NAD ~  CXR 3/16 in hosp showed norm heart size, mild left base atx, otherw OK...   HYPERTENSION (ICD-401.9) - controlled on ASA '81mg'$ /d,  CARTIAXT '180mg'$ /d, LASIX '40mg'$ -  1-2Qam... ~  7/10:  labs showed BUN=54, Creat=2.0 & rec to decr diuretics in half= one Demedex & 1/2 Aldactone. ~  11/10:  labs showed BUN=38, Creat=1.8 & rec to keep same meds. ~  8/11:  labs showed BUN=42, Creat=2.1, K=4.3 ~  1/12:  labs in ER showed BUN=56, Creat=2.67, therefore diuretics decr to Demadex20/d only. ~  3/12:  2DEcho showed mod LVH, norm sys function w/ EF=60-65% & no regional wall motion abn; Gr1DD, mibile density on AoV- prob lambl'e escrescence, mildMR. ~  5/12:  f/u labs in office showed BUN=45, Creat=1.8, on Lasix '80mg'$  Qam for her edema... ~  [facility]12[facility]  [facility]33, Creat=1.47, edema resolved therefore decr Lasix to '40mg'$ /d. ~  1/13:  BP= 160/76 but hasn't taken meds today; labs improved w/ BUN=26, Creat=1.3, BNP=50 ~  5/13:  BP= 140/78 & denies CP, palpit, SOB, edema;  labs improved w/ BUN=28, Creat=1.2 ~  9/13:  BP= 140/60 & she denies CP, palpt, ch in SOB or edema... ~  12/13: controlled on Diltiazem180 & Furosemide '40mg'$ -2Qam w/ BP=138/62 today & she denies CP, palpit, dizzy, ch inSOB, etc... ~  3/14:  BP= 130/66 on same meds; Labs stable as [name] w/ Cr=1.5, continue same... ~  7/14: on Diltiazem180 & Furosemide '40mg'$ -2Qam w/ BP=150/64 today & she denies CP, palpit, dizzy, ch in SOB, etc. ~  11/14: BP= 126/64 on same meds, stable... ~  EKG 1/15 showed NSR, rate92, NSSTTWA, NAD... ~  3/15: on Diltiazem180 & Furosemide '40mg'$ -2Qam w/ BP=118/70 today & she denies CP, palpit, dizzy, ch in SOB, etc. ~  9/15: post  hosp> off Diltiazem now & still on Furosemide '40mg'$ - 1-2Qam & K10-2/d w/ BP=146/60 w/ leg swelling, edema, cellulitis as below... ~  1/16: on Diltiazem180, Furosemide '40mg'$ -2Qam, & K10-2/d;  BP=132/64 today & she denies CP, palpit, dizzy, ch in SOB, etc. ~  3/16: on Diltiazem180, Furosemide '40mg'$ Qam, & off KCl since disch;  BP=134/62 today & she denies CP, palpit, dizzy, ch in SOB, etc ~  7/16: on Diltiazem180, Furosemide '40mg'$ Qam, & KCl Bid;  BP=120/70 & she remains asymptomatic...  PALPITATIONS, HX OF (ICD-V12.50) ~  3/16:  EKG showed NSR, rate86, PACs, poor r prog V1-2, NAD...  VENOUS INSUFFICIENCY/ EDEMA >> swelling controlled w/ diuretics, low sodium diet, elevation, support hose, etc... RIGHT LEG SWELLING > LEFT LEG... Pt notes it's been like this since her last child was born... Hx RECURRENT CELLULITIS >>  ~  She saw DrEarly [facility]- there was nothing he could do... ~  Swelling diminished w/ low sodium, elevation, support hose, & Lasix40 1-2tabs daily... ~  Nov-Dec2013: she knows to elim sodium, elevate, wear support hose; she has been seen by [facility], DrEarly & there is nothing they can do; recently checked by [facility] M[facility]allum- DrJones w/ patch dressing & support hose; edema decr w/ Lasix '80mg'$ /d; Lab today shows Creat=1.5.[name]. ~  7/14:  she knows to elim sodium, elevate, wear support hose, & the Lasix80; weight is down 10# today to 141# w/ resolution of edema; [name] (Derm) treated dermatitis w/ new cream & improved. ~  11/14: wt is back up 8# to 149# today & she needs to elim sodium, elev legs, etc... ~  3/15: she knows to elim sodium, elevate, wear support hose, & the Lasix80; weight is back down 9# today to 137#.[name]. ~  9//15: she was [facility] for cellulitis in LLE (NOS) w/ 4+edema etc; VenDopplers neg for DVT; no open wounds/ drainage; treated w/ IV antibiotics and disch on Keflex; has persistent erythema, pain, swelling & we decided to continue the Keflex & refer to ID... ~  CT Abd&Pelvis 9/15> showed no acute  abnormality & no venous obstruction or lymphocele to suggest a cause for left leg swelling; Cholelithiasis; unchanged bilateral adnexal cystic lesions (probably cysts); unchanged renal cysts and left renal atrophy; unchanged left perianal lipoma... ~  10/15: she is improved after the extended course of Keflex w/ decr erythema, swelling & discomfort; she did not see ID; plan is to continue same meds, cleansing, elevation, no salt, support hose... ~  3/16: she was [facility] w/ right leg cellulitis- states she was out shopping & "over-did it", felt weak & tired then developed right leg discomfort, increased swelling/ redness/ etc; WBC was 21K, VenDopplers were neg for DVT, treated w/ Zosyn/ Vanco & changed to oral Doxy at disch;  She notes that her leg is improved- less  swollen, less red, less painful but still sl tender; ~  07/2015: back to baseline... ~  02/2016: Recurrent redness/ swelling 01/2016- no better on Keflex from DrWert + topical wraps and creams from [place], Derm; we decided to treat w/ Pred10, incr Lasix80, no salt, Atarax for itching... ~  03/2016:  Much improved on the Pred10 + Lasix80 Qam; renal function is stable, the edema is diminished, and sed rate is now down to 15!  OK to wean Pred to '5mg'$  Qam...  HYPERCHOLESTEROLEMIA (ICD-272.0) - prev Rx'd by DrGegick... Now on ZOCOR '20mg'$ /d (prev Crestor5 & Lescol from [facility]) ~  she brought labs from [facility] 5/10 (off med due to $$$) w/ TChol 285, TG 204, HDL 69, LDL 195... "he was mad at me" ~  FLP 5/11 from [facility] showed TChol 202, TG 60, HDL 112, LDL 99 ~  She is reminded (again) to come FASTING for f/u FLP... ~  [place] 5/13 on Simva20 showed TChol 191, TG 81, HDL 82, LDL 93 ~  She is reminded to ret FASTING for f/u blood work...  HYPOTHYROIDISM (ICD-244.9) - prev followed by DrGegick on SYNTHROID 18mg- 1/2 daily... ~  [facility]/ [facility]SH= 2.90, FreeT4= 1.09 ~  labs 8/11 showed TSH= 1.87 ~  Labs 3/12 showed TSH= 2.81 ~  Labs 1/13  showed TSH= 1.65 ~  Labs 3/14 showed TSH= 4.28 ~  Labs 3/16 showed TSH= 1.21  GERD (ICD-530.81) - uses PRILOSEC '20mg'$ /d... last EGD was 6/06 by DrPatterson showing 3cmHH, reflux...   DIVERTICULOSIS OF COLON (ICD-562.10) & COLONIC POLYPS (ICD-211.3) - last colonoscopy 6/06 was WNL- no abnormality seen...  GALLSTONE and CHOLECYSTITIS >>  ~  [facility] w/ RUQ pain & dx w/ gallstones, cholecystitis, & had ERCP w/ sphincterotomy (DrStark) & stone extraction; treated w/ Cipro, LFTs improved, course complicated by LE cellulitis requiring addition of Vanco IV; she had f/u DrCornett, [facility] 2/15> brief note, did not rec elective surg given her age. ~  CT Abd&Pelvis 9/15> showed no acute abnormality & no venous obstruction or lymphocele to suggest a cause for left leg swelling; Cholelithiasis; unchanged bilateral adnexal cystic lesions (probably cysts); unchanged renal cysts and left renal atrophy; unchanged left perianal lipoma  RENAL INSUFFICIENCY (ICD-588.9) - tough balance betw renal perfusion & diuresis for edema... ~  labs 10/08 showed BUN= 27, Creat= 1.5 ~  labs 8/09 showed BUN= 44, Creat= 1.9 ~  labs 2/10 showed BUN 46, Creat= 1.7 ~  labs 7/10 showed BUN= 54, Creat= 2.0 ~  labs 11/10 showed BUN= 38, Creat= 1.8 ~  labs 8/11 showed BUN= 42, Creat= 2.1 ~  labs in ER 1/12 showed BUN=56, Creat=2.67, rec decr diuretics to Demadex'20mg'$ /d only. ~  Labs 3/12 showed improved renal function w/ BUN=31, Creat=1.4... ~  Labs 5/12 on Lasix80 showed BUN=45, Creat=1.8 ~  Labs 9/12 by DrDeveshwar showed BUN=33, Creat=1.47 ~  Labs 1/13 showed BUN=26, Creat=1.3, BNP=50... On Lasix '40mg'$  Qam. ~  Labs 5/13 showed BUN=28, Creat=1.2 ~  Labs 12/13 on Lasix80 showed BUN=30, Creat=1.5 ~  Labs 3/14 on Lasix80 showed BUN=31, Creat=1.5 ~  Labs 6/14 by DrDeveshwar on Lasix80 showed BUN=30, Cr=1.3 ~  Labs here 11/14 on Lasix80 showed BUN=31, Cr= 1.3 ~  [facility] & [facility],  s/p ERCP, and Creat- 1.1 - 1.7 ~  Labs 9/15 showed Cr= 0.94 to 1.4 range... ~  Labs 10/15 showed Cr= 1.3-1.4 ~  Labs 3/16 showed Cr= 1.52=>1.12 ~  Labs 7/16 showed BUN= 35, Cr= 1.13  DEGENERATIVE JOINT DISEASE (ICD-715.90) - this is her chief complaint- s/p right TKR in 1999,  left TKR 3/10... on MOBIC 7.'5mg'$  Prn & VICODIN Prn as [name]... followed by DrYates; and had right second toe amp by Arnette Norris 2010 for hammertoe + spurs... also had epidural steroid shot from [facility] for LBP (without benefit she says)... ~  5/13:  She saw DrDeveshwar for Rheum w/ shot in hip & sl improved... 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 rec- start Vit D 50,000 u weekly... ~  labs 2/10 showed Vit D level = 30... rec- continue Vit D 50K weekly... ~  labs 7/10 showed Vit D level = 39... rec- change to 1000 u OTC daily. ~  labs 8/11 showed Vit D level = 38... continue same. ~  Labs 3/14 showed Vit D level = 43... Continue 1000u daily.  ANXIETY (ICD-300.00) - on ALPRAZOLAM 0.'25mg'$ Tid Prn... several deaths in the family... daughter who lives w/ her recently ill...  SHINGLES - she had right T9-10 shingles in 2011 & resolved w/ Valtrex, Pred, etc...   Past Surgical History:  Procedure Laterality Date  . ABDOMINAL HYSTERECTOMY    . BREAST BIOPSY     Benign  . CATARACT EXTRACTION    . ERCP N/A 10/06/2013   Procedure: ENDOSCOPIC RETROGRADE CHOLANGIOPANCREATOGRAPHY (ERCP);  Surgeon: Ladene Artist, MD;  Location: [place];  Service: Endoscopy;  Laterality: N/A;  . TOE AMPUTATION  08/2009   Right second toe - Dr Beola Cord  . TOTAL KNEE ARTHROPLASTY  1999   right  . TOTAL KNEE ARTHROPLASTY  11/2008   left - Dr Lorin Mercy    Outpatient Encounter Prescriptions as of 05/05/2017  Medication Sig  . acetaminophen (TYLENOL) 325 MG tablet Take 325 mg by mouth every 4 (four) hours as needed for mild pain or  moderate pain.   [name] allopurinol (ZYLOPRIM) 300 MG tablet Take 1 tablet by mouth daily.  [name] ALPRAZolam (XANAX) 0.25 MG tablet TAKE 1/2-1 TABLET BY MOUTH 3 TIMES DAILY AS NEEDED FOR ANXIETY  . Artificial Tear Ointment (ARTIFICIAL TEARS) ointment Place 1 drop into both eyes as needed.  . Ascorbic Acid (VITAMIN C) 500 MG tablet Take 500 mg by mouth daily.    [name] aspirin 81 MG tablet Take 81 mg by mouth daily.    . bimatoprost (LUMIGAN) 0.01 % SOLN Place 1 drop into both eyes at bedtime.  . Cholecalciferol (VITAMIN D) 1000 UNITS capsule Take 1,000 Units by mouth daily.    [name] diltiazem (CARDIZEM CD) 180 MG 24 hr capsule TAKE ONE CAPSULE BY MOUTH EVERY DAY  . dorzolamide (TRUSOPT) 2 % ophthalmic solution Place 1 drop into both eyes 2 (two) times daily.  . ferrous sulfate 325 (65 FE) MG tablet Take 1 tablet (325 mg total) by mouth 2 (two) times daily with a meal.  . folic acid (FOLVITE) 1 MG tablet Take 1 tablet (1 mg total) by mouth daily.  . furosemide (LASIX) 40 MG tablet TAKE 2 TABLETS BY MOUTH EVERY MORNING  . hydrOXYzine (ATARAX/VISTARIL) 25 MG tablet Take 1 tablet (25 mg total) by mouth every 6 (six) hours as needed.  . latanoprost (XALATAN) 0.005 % ophthalmic solution Place 1 drop into both eyes 2 (two) times daily.  [name] levothyroxine (SYNTHROID, LEVOTHROID) 125 MCG tablet TAKE 1/2 TABLET BY  MOUTH DAILY BEFORE BREAKFAST  . loperamide (IMODIUM) 2 MG capsule Take 1 capsule (2 mg total) by mouth as needed for diarrhea or loose stools.  . Multiple Vitamin (MULTIVITAMIN WITH MINERALS) TABS tablet Take 1 tablet by mouth daily.  . mupirocin ointment (BACTROBAN) 2 % 1 APPLICATION APPLY ON THE SKIN AS DIRECTED APPLY TO AFFECTED AREA ON HAND AND COVER AS DIRECTED  . nepafenac (ILEVRO) 0.3 % ophthalmic suspension Place 1 drop into the right eye daily.  . Omega-3 Fatty Acids (FISH OIL) 1000 MG CAPS Take 1 capsule by mouth daily.  [name] omeprazole (PRILOSEC) 20 MG capsule Take 20 mg by mouth daily.    . ondansetron  (ZOFRAN) 8 MG tablet Take 1 tablet (8 mg total) by mouth every 6 (six) hours as needed for nausea or vomiting.  Vladimir Faster Glycol-Propyl Glycol (SYSTANE) 0.4-0.3 % SOLN Place 1 drop into both eyes 4 (four) times daily.   . potassium chloride (KLOR-CON M10) 10 MEQ tablet TAKE 2 TABLETS (20 MEQ TOTAL) BY MOUTH DAILY.  [name] predniSONE (DELTASONE) 10 MG tablet TAKE 1 TABLET (10 MG TOTAL) BY MOUTH DAILY WITH BREAKFAST.  [name] simvastatin (ZOCOR) 20 MG tablet TAKE 1 TABLET (20 MG TOTAL) BY MOUTH DAILY.  [name] thiamine 100 MG tablet Take 1 tablet (100 mg total) by mouth daily.  . traMADol (ULTRAM) 50 MG tablet TAKE 1 TABLET BY MOUTH EVERY 8 HOURS AS NEEDED FOR PAIN   No facility-administered encounter medications on file as of 05/05/2017.     Allergies  Allergen Reactions  . Enablex [Darifenacin Hydrobromide Er] Other (See Comments)    Caused her throat and mouth to have bumps and feel like it was swelling  . Clarithromycin Swelling    REACTION: causes her mouth to swell  . Latanoprost Itching    Eye reddness and swelling and itching  . Codeine Nausea Only  . Morphine Nausea And Vomiting  . Sulfonamide Derivatives Other (See Comments)    REACTION: unsure of reaction    Immunization History  Administered Date(s) Administered  . Influenza Split 06/13/2011, 06/17/2012, 07/17/2013  . Influenza Whole 06/15/2008, 06/21/2009, 06/07/2010  . Influenza,inj,Quad PF,36+ Mos 06/02/2014, 08/07/2015, 05/22/2016  . Pneumococcal Conjugate-13 06/28/2016  . Pneumococcal Polysaccharide-23 03/24/2003  . Td 03/24/2003, 10/19/2013    Current Medications, Allergies, Past Medical History, Past Surgical History, Family History, and Social History were reviewed in [employer] record.    Review of Systems        See HPI - all other systems neg except as noted...  The patient complains of dyspnea on exertion, peripheral edema, muscle weakness, and difficulty walking.  The patient denies anorexia,  fever, weight loss, weight gain, vision loss, decreased hearing, hoarseness, chest pain, syncope, prolonged cough, headaches, hemoptysis, abdominal pain, melena, hematochezia, severe indigestion/heartburn, hematuria, incontinence, genital sores, suspicious skin lesions, transient blindness, depression, unusual weight change, abnormal bleeding, enlarged lymph nodes, and angioedema.     Objective:   Physical Exam      WD, WN, 83 y/o WF in NAD... GENERAL:  Alert & oriented; pleasant & cooperative... HEENT:  [place]/AT, EOM- full, EACs-clear, TMs-wnl, NOSE-clear, THROAT-clear & wnl. NECK:  Supple w/ fairROM; no JVD; normal carotid impulses w/o bruits; no thyromegaly or nodules palpated; no lymphadenopathy. CHEST:  Clear, decr BS bilat w/o wheezing, rales, or rhonchi heard... HEART:  Regular Rhythm;  gr 1/6 SEM without rubs or gallops detected... ABDOMEN:  Soft & nontender; normal bowel sounds; no organomegaly or masses detected. EXT:  moderate arthritic  changes; s/p bilat TKRs; mild varicose veins/ +venous insuffic/ tr edema Left leg, min red & tender... s/p right second toe distal amputation... NEURO:  CN's intact;  no focal neuro deficits... DERM:  No lesions noted; no rash etc...  RADIOLOGY DATA:  Reviewed in the EPIC EMR & discussed w/ the patient...  LABORATORY DATA:  Reviewed in the EPIC EMR & discussed w/ the patient...   Assessment & Plan:    Hx Left LEG CELLULITIS, then Right LEG CELLULITIS>> ?Etiology (NOS), no broken skin or draining areas; treated [facility] 1/15 & disch on Keflex w/ persistent red/ swollen/ tender left leg; we continued the Keflex for 2wks longer & wanted ID consult; she never got the consult but improved on the Keflex; now off the antibiotic w/ decr swelling, erythema, discomfort; REC to continue meds, elevation, no salt, support hose, etc...  12/15> she reports left leg red, incr swelling, & drainage- went to Derm, DrJordan & given cream which she reports  has helped... 3/16> she was hosp w/ right leg cellulitis- swollen, red, inflammed, tender- but again NOS, no broken skin, etc; treated w/ Zosyn/ Vanco & disch on Doxy=> improved... 7/16> stable w/o recurrent soft tissue infection 11//16> at baseline... 6/17> Recurrent redness/ swelling 01/2016- no better on Keflex from DrWert + topical wraps and creams from DrJordan, Derm; we decided to treat w/ Pred10, incr Lasix80, no salt, Atarax for itching 7/17> Rec to contuinue low sodium diet & the Lasix40- 2tabs Qam;  OK to wean the Pred '10mg'$  tabs to 1/2 tab Qam & stay on this!  ROV in 1 month... 8/17> We discussed the poss need for further eval of the dizziness but for now she would like to continue the Meclizine prn & try the epley maneuvers (reviewed w/ pt & hand-out given); sheknows to be very careful w/ changing positions and ambulation;  She will continue the Pred '5mg'$  Qam & her Lasix4-=2Qam, avoid sodium, rov 27mo 07/01/16>   We reviewed her medical hx & discussed each concerning symptom individually to her satisfaction; we decided to continue the current meds the same & recheck pt in ~314mo. 10/15/16>   ViWellss 9945/o w/ mult somatic complaints and prob list as above; we reviewed her medical issues and the meds used to treat them; her persistent complaint is dizziness & eval by ENT neg, eval by Neuro is pending; we reviewed her med- continue same; we will continue Q3m3mosits   URI/ Bronchitis> she presented 1/16 w/ sore throat, cough, yellow sput & we decided to treat w/ Levaquin x5d, Depo80, Pred dosepak; she may also use Mucinex, fluids, & MMW if needed...  HBP>  Controlled on meds, continue same including the Lasix40Qam...  Cardiac>  Prev DrRoss' note reviewed; see 2DEcho, Event Monitor reports no dangerous arrhythmias, continue meds...  Ven Insuffic/ EDEMA- improved>  evals by Cards & [facility]> "there is nothing they can do"; continue elevation, no salt, Lasix80 now, compression...  CHOL>  On  Simva20 & FLP 5/13 looked good...  HYPOTHYROID>  Continue Synthroid 125m71m taking 1/2 tab daily...  GI>  Stable now s/p ERCP for gallstones; followed by DrStark now... Episode of cholecystitis, gallstones 1/15- s/p ERCP & improved w/o surg (DrStark, DrCornett)...  Renal Insuffic>  Careful w/ diuresis, NSAIDs etc; Creat varied 1.1 to 1.7  DJD>  She saw DrDeveshwar for her Rheum complaints==> shot in knee; DrYates/ NewtErnestina Patchese signed off; may need pain clinic....  Other problems as noted => Dizziness, on meclizine, epley maneuvers, and  Neuro consult pending.   Patient's Medications  New Prescriptions   No medications on file  Previous Medications   ACETAMINOPHEN (TYLENOL) 325 MG TABLET    Take 325 mg by mouth every 4 (four) hours as needed for mild pain or moderate pain.    ALLOPURINOL (ZYLOPRIM) 300 MG TABLET    Take 1 tablet by mouth daily.   ALPRAZOLAM (XANAX) 0.25 MG TABLET    TAKE 1/2-1 TABLET BY MOUTH 3 TIMES DAILY AS NEEDED FOR ANXIETY   ARTIFICIAL TEAR OINTMENT (ARTIFICIAL TEARS) OINTMENT    Place 1 drop into both eyes as needed.   ASCORBIC ACID (VITAMIN C) 500 MG TABLET    Take 500 mg by mouth daily.     ASPIRIN 81 MG TABLET    Take 81 mg by mouth daily.     BIMATOPROST (LUMIGAN) 0.01 % SOLN    Place 1 drop into both eyes at bedtime.   CHOLECALCIFEROL (VITAMIN D) 1000 UNITS CAPSULE    Take 1,000 Units by mouth daily.     DILTIAZEM (CARDIZEM CD) 180 MG 24 HR CAPSULE    TAKE ONE CAPSULE BY MOUTH EVERY DAY   DORZOLAMIDE (TRUSOPT) 2 % OPHTHALMIC SOLUTION    Place 1 drop into both eyes 2 (two) times daily.   FERROUS SULFATE 325 (65 FE) MG TABLET    Take 1 tablet (325 mg total) by mouth 2 (two) times daily with a meal.   FOLIC ACID (FOLVITE) 1 MG TABLET    Take 1 tablet (1 mg total) by mouth daily.   FUROSEMIDE (LASIX) 40 MG TABLET    TAKE 2 TABLETS BY MOUTH EVERY MORNING   HYDROXYZINE (ATARAX/VISTARIL) 25 MG TABLET    Take 1 tablet (25 mg total) by mouth every 6 (six) hours as  needed.   LATANOPROST (XALATAN) 0.005 % OPHTHALMIC SOLUTION    Place 1 drop into both eyes 2 (two) times daily.   LEVOTHYROXINE (SYNTHROID, LEVOTHROID) 125 MCG TABLET    TAKE 1/2 TABLET BY MOUTH DAILY BEFORE BREAKFAST   LOPERAMIDE (IMODIUM) 2 MG CAPSULE    Take 1 capsule (2 mg total) by mouth as needed for diarrhea or loose stools.   MULTIPLE VITAMIN (MULTIVITAMIN WITH MINERALS) TABS TABLET    Take 1 tablet by mouth daily.   MUPIROCIN OINTMENT (BACTROBAN) 2 %    1 APPLICATION APPLY ON THE SKIN AS DIRECTED APPLY TO AFFECTED AREA ON HAND AND COVER AS DIRECTED   NEPAFENAC (ILEVRO) 0.3 % OPHTHALMIC SUSPENSION    Place 1 drop into the right eye daily.   OMEGA-3 FATTY ACIDS (FISH OIL) 1000 MG CAPS    Take 1 capsule by mouth daily.   OMEPRAZOLE (PRILOSEC) 20 MG CAPSULE    Take 20 mg by mouth daily.     ONDANSETRON (ZOFRAN) 8 MG TABLET    Take 1 tablet (8 mg total) by mouth every 6 (six) hours as needed for nausea or vomiting.   POLYETHYL GLYCOL-PROPYL GLYCOL (SYSTANE) 0.4-0.3 % SOLN    Place 1 drop into both eyes 4 (four) times daily.    POTASSIUM CHLORIDE (KLOR-CON M10) 10 MEQ TABLET    TAKE 2 TABLETS (20 MEQ TOTAL) BY MOUTH DAILY.   PREDNISONE (DELTASONE) 10 MG TABLET    TAKE 1 TABLET (10 MG TOTAL) BY MOUTH DAILY WITH BREAKFAST.   SIMVASTATIN (ZOCOR) 20 MG TABLET    TAKE 1 TABLET (20 MG TOTAL) BY MOUTH DAILY.   THIAMINE 100 MG TABLET    Take 1 tablet (100  mg total) by mouth daily.   TRAMADOL (ULTRAM) 50 MG TABLET    TAKE 1 TABLET BY MOUTH EVERY 8 HOURS AS NEEDED FOR PAIN  Modified Medications   No medications on file  Discontinued Medications   No medications on file

## 2017-05-08 ENCOUNTER — Other Ambulatory Visit: Payer: Self-pay | Admitting: Pulmonary Disease

## 2017-05-12 ENCOUNTER — Other Ambulatory Visit: Payer: Self-pay | Admitting: Pulmonary Disease

## 2017-05-16 ENCOUNTER — Other Ambulatory Visit: Payer: Self-pay | Admitting: Pulmonary Disease

## 2017-05-16 NOTE — Telephone Encounter (Signed)
Refill request of tramadol 50mg , dispense 90tabs. Pt last seen at office 05/05/17. Med last filled 02/19/17.  Dr. Lenna Gilford, please advise if it is okay to refill this med for pt.  Thanks!

## 2017-05-27 ENCOUNTER — Other Ambulatory Visit: Payer: Self-pay | Admitting: Pulmonary Disease

## 2017-06-17 ENCOUNTER — Ambulatory Visit (INDEPENDENT_AMBULATORY_CARE_PROVIDER_SITE_OTHER): Payer: Medicare Other

## 2017-06-17 DIAGNOSIS — Z23 Encounter for immunization: Secondary | ICD-10-CM

## 2017-06-19 DIAGNOSIS — Z23 Encounter for immunization: Secondary | ICD-10-CM | POA: Diagnosis not present

## 2017-06-30 ENCOUNTER — Other Ambulatory Visit: Payer: Self-pay | Admitting: Pulmonary Disease

## 2017-07-02 ENCOUNTER — Telehealth: Payer: Self-pay | Admitting: Pulmonary Disease

## 2017-07-02 MED ORDER — AZITHROMYCIN 250 MG PO TABS: ORAL_TABLET | ORAL | 0 refills | Status: AC

## 2017-07-02 NOTE — Telephone Encounter (Signed)
Spoke with pt, advised rx sent to pharmacy. Per SN, Zpak

## 2017-07-02 NOTE — Telephone Encounter (Signed)
Spoke with patient's daughter Manuela Schwartz. She stated that her mother has been feeling ill since this past Sunday. Started out with a sore throat. Now she has a productive cough with yellowish mucus. Denies any fever or body aches. Has only taken Benadryl at this point without any relief.   [facility] wishes [facility] use [facility] [address].   SN, please advise. Thanks!

## 2017-07-03 ENCOUNTER — Ambulatory Visit: Payer: Medicare Other | Admitting: Pulmonary Disease

## 2017-07-06 ENCOUNTER — Other Ambulatory Visit: Payer: Self-pay | Admitting: Pulmonary Disease

## 2017-07-16 DIAGNOSIS — H01132 Eczematous dermatitis of right lower eyelid: Secondary | ICD-10-CM | POA: Diagnosis not present

## 2017-07-16 DIAGNOSIS — H01135 Eczematous dermatitis of left lower eyelid: Secondary | ICD-10-CM | POA: Diagnosis not present

## 2017-07-21 ENCOUNTER — Other Ambulatory Visit: Payer: Self-pay | Admitting: Pulmonary Disease

## 2017-07-21 MED ORDER — FUROSEMIDE 40 MG PO TABS: 80.0000 mg | ORAL_TABLET | Freq: Every morning | ORAL | 5 refills | Status: AC

## 2017-07-28 ENCOUNTER — Ambulatory Visit (INDEPENDENT_AMBULATORY_CARE_PROVIDER_SITE_OTHER): Payer: Medicare Other | Admitting: Neurology

## 2017-07-28 ENCOUNTER — Encounter: Payer: Self-pay | Admitting: Neurology

## 2017-07-28 VITALS — BP 140/68 | HR 95 | Ht 63.0 in | Wt 134.4 lb

## 2017-07-28 DIAGNOSIS — R42 Dizziness and giddiness: Secondary | ICD-10-CM

## 2017-07-28 NOTE — Progress Notes (Signed)
NEUROLOGY FOLLOW UP OFFICE NOTE  [name] [id number]  HISTORY OF PRESENT ILLNESS: [name] is a 83 year old female with HTN, renal insufficiency, venous insufficiency, hypothyroidism, aortic valve disorder and history of asthmatic bronchitis who follows up for dizziness.  She is accompanied by her daughter, who supplements history.   UPDATE: Dizziness is unchanged.  Over the past year, she has lost hearing and has become legally blind.   HISTORY: Back in August 2017, she got up in the middle of the night.  When she got back to bed, she had an acute episode of spinning.  She cannot recall how long it lasted.  Ever since then, she has had dizziness and difficulty walking.  When she is laying down and sitting, she feels completely fine.  When she stands up or sometimes while walking, she reports dizziness, described as lightheadedness or wooziness, but not spinning.  She is very unsteady on her feet.  She reports having neuropathy in her feet.  She uses a cane.  There has been no associated headache, double vision, facial droop, nausea, or unilateral weakness or numbness.  Symptoms have been unchanged.  Meclizine and Epley maneuver have not been effective.  She did see ENT who found hearing loss in the left ear, but otherwise unremarkable.  She underwent vestibular rehabilitation, which was ineffective.     She has history of typical BPPV, but this is much different. For many years, she has worn thigh-high compression stockings for "leg swelling".    PAST MEDICAL HISTORY: Past Medical History:  Diagnosis Date  . Acute bronchitis   . Anxiety state, unspecified   . Benign neoplasm of colon   . CKD (chronic kidney disease), stage III ([place]) 10/06/2013  . Diverticulosis of colon (without mention of hemorrhage)   . DJD (degenerative joint disease)   . GERD (gastroesophageal reflux disease)   . Glaucoma   . Hypertension   . Hypothyroidism   . Palpitations   . Pure  hypercholesterolemia   . Renal insufficiency   . Unspecified venous (peripheral) insufficiency   . Vitamin D deficiency disease     MEDICATIONS: Current Outpatient Medications on File Prior to Visit  Medication Sig Dispense Refill  . acetaminophen (TYLENOL) 325 MG tablet Take 325 mg by mouth every 4 (four) hours as needed for mild pain or moderate pain.     [name] allopurinol (ZYLOPRIM) 300 MG tablet Take 1 tablet by mouth daily.    [name] ALPRAZolam (XANAX) 0.25 MG tablet TAKE 1/2-1 TABLET BY MOUTH 3 TIMES DAILY AS NEEDED FOR ANXIETY 90 tablet 5  . Artificial Tear Ointment (ARTIFICIAL TEARS) ointment Place 1 drop into both eyes as needed.    . Ascorbic Acid (VITAMIN C) 500 MG tablet Take 500 mg by mouth daily.      [name] aspirin 81 MG tablet Take 81 mg by mouth daily.      [name] azithromycin (ZITHROMAX) 250 MG tablet Take 2 pills today then one a day for 4 additional days 6 tablet 0  . bimatoprost (LUMIGAN) 0.01 % SOLN Place 1 drop into both eyes at bedtime.    . Cholecalciferol (VITAMIN D) 1000 UNITS capsule Take 1,000 Units by mouth daily.      [name] diltiazem (CARDIZEM CD) 180 MG 24 hr capsule TAKE ONE CAPSULE BY MOUTH EVERY DAY 90 capsule 0  . dorzolamide (TRUSOPT) 2 % ophthalmic solution Place 1 drop into both eyes 2 (two) times daily.    . ferrous sulfate 325 (65  FE) MG tablet Take 1 tablet (325 mg total) by mouth 2 (two) times daily with a meal. 60 tablet 2  . folic acid (FOLVITE) 1 MG tablet Take 1 tablet (1 mg total) by mouth daily. 30 tablet 1  . furosemide (LASIX) 40 MG tablet Take 2 tablets (80 mg total) by mouth every morning. 60 tablet 5  . hydrOXYzine (ATARAX/VISTARIL) 25 MG tablet Take 1 tablet (25 mg total) by mouth every 6 (six) hours as needed. 50 tablet 0  . latanoprost (XALATAN) 0.005 % ophthalmic solution Place 1 drop into both eyes 2 (two) times daily.    [name] levothyroxine (SYNTHROID, LEVOTHROID) 125 MCG tablet TAKE 1/2 TABLET BY MOUTH DAILY BEFORE BREAKFAST 45 tablet 3  . loperamide  (IMODIUM) 2 MG capsule Take 1 capsule (2 mg total) by mouth as needed for diarrhea or loose stools. 10 capsule 0  . Multiple Vitamin (MULTIVITAMIN WITH MINERALS) TABS tablet Take 1 tablet by mouth daily. 30 tablet 0  . mupirocin ointment (BACTROBAN) 2 % 1 APPLICATION APPLY ON THE SKIN AS DIRECTED APPLY TO AFFECTED AREA ON HAND AND COVER AS DIRECTED  0  . nepafenac (ILEVRO) 0.3 % ophthalmic suspension Place 1 drop into the right eye daily.    . Omega-3 Fatty Acids (FISH OIL) 1000 MG CAPS Take 1 capsule by mouth daily.    [name] omeprazole (PRILOSEC) 20 MG capsule Take 20 mg by mouth daily.      . ondansetron (ZOFRAN) 8 MG tablet Take 1 tablet (8 mg total) by mouth every 6 (six) hours as needed for nausea or vomiting. 20 tablet 0  . Polyethyl Glycol-Propyl Glycol (SYSTANE) 0.4-0.3 % SOLN Place 1 drop into both eyes 4 (four) times daily.     . potassium chloride (KLOR-CON M10) 10 MEQ tablet TAKE 2 TABLETS (20 MEQ TOTAL) BY MOUTH DAILY. 180 tablet 3  . predniSONE (DELTASONE) 10 MG tablet TAKE 1 TABLET (10 MG TOTAL) BY MOUTH DAILY WITH BREAKFAST. 50 tablet 2  . simvastatin (ZOCOR) 20 MG tablet TAKE 1 TABLET (20 MG TOTAL) BY MOUTH DAILY. 90 tablet 0  . thiamine 100 MG tablet Take 1 tablet (100 mg total) by mouth daily. 30 tablet 1  . traMADol (ULTRAM) 50 MG tablet TAKE 1 TABLET BY MOUTH EVERY 8 HOURS AS NEEDED FOR PAIN 90 tablet 5   No current facility-administered medications on file prior to visit.     ALLERGIES: Allergies  Allergen Reactions  . Enablex [Darifenacin Hydrobromide Er] Other (See Comments)    Caused her throat and mouth to have bumps and feel like it was swelling  . Clarithromycin Swelling    REACTION: causes her mouth to swell  . Latanoprost Itching    Eye reddness and swelling and itching  . Codeine Nausea Only  . Morphine Nausea And Vomiting  . Sulfonamide Derivatives Other (See Comments)    REACTION: unsure of reaction    FAMILY HISTORY: Family History  Problem Relation Age  of Onset  . Heart disease Mother   . Cancer Father        Throat  . Heart disease Father     SOCIAL HISTORY: Social History   Socioeconomic History  . Marital status: Widowed    Spouse name: Not on file  . Number of children: 1  . Years of education: Not on file  . Highest education level: Not on file  Social Needs  . Financial resource strain: Not on file  . Food insecurity - worry: Not on file  .  Food insecurity - inability: Not on file  . Transportation needs - medical: Not on file  . Transportation needs - non-medical: Not on file  Occupational History    Employer: RETIRED  Tobacco Use  . Smoking status: Never Smoker  . Smokeless tobacco: Never Used  Substance and Sexual Activity  . Alcohol use: No    Alcohol/week: 0.0 oz  . Drug use: No  . Sexual activity: Not on file  Other Topics Concern  . Not on file  Social History Narrative  . Not on file    REVIEW OF SYSTEMS: Constitutional: No fevers, chills, or sweats, no generalized fatigue, change in appetite Eyes: vision loss Ear, nose and throat: hearing loss Cardiovascular: No chest pain, palpitations Respiratory:  No shortness of breath at rest or with exertion, wheezes GastrointestinaI: No nausea, vomiting, diarrhea, abdominal pain, fecal incontinence Genitourinary:  No dysuria, urinary retention or frequency Musculoskeletal:  No neck pain, back pain Integumentary: No rash, pruritus, skin lesions Neurological: as above Psychiatric: no depression Endocrine: No palpitations, fatigue, diaphoresis, mood swings, change in appetite, change in weight, increased thirst Hematologic/Lymphatic:  No purpura, petechiae. Allergic/Immunologic: no itchy/runny eyes, nasal congestion, recent allergic reactions, rashes  PHYSICAL EXAM: Vitals:   07/28/17 1310  BP: 140/68  Pulse: 95  SpO2: 96%   General: No acute distress.  Patient appears well-groomed.   Head:  Normocephalic/atraumatic Eyes:  Fundi examined but not  visualized Neck: supple, no paraspinal tenderness, full range of motion Heart:  Regular rate and rhythm Lungs:  Clear to auscultation bilaterally Back: No paraspinal tenderness Neurological Exam: alert and oriented to person, place, and time. Attention span and concentration intact, recent and remote memory intact, fund of knowledge intact.  Speech fluent and not dysarthric, language intact.  CN II-XII intact. Bulk and tone normal, muscle strength 5/5 throughout.  Sensation to light touch  intact.  Deep tendon reflexes 2+ throughout.  Finger to nose testing intact.  Short stride.  IMPRESSION: Dizziness.  Unclear etiology.  1.  Since this is an ongoing problem that distresses her, we will get CT of the head 2.  Treatment for dizziness is difficult.  Vestibular rehab and meclizine are ineffective.  I would be concerned about benzodiazepines due to her age.  Dr. Lenna Gilford may want to consider starting an SSRI   15 minutes spent face to face with patient, over 50% spent discussing diagnosis and management.  Metta Clines, DO  CC:  Teressa Lower, MD

## 2017-07-28 NOTE — Patient Instructions (Signed)
We will get a CT of the head.  I will let you know what it shows. I will recommend to Dr. Lenna Gilford about starting an antidepressant, which may help with dizziness.

## 2017-08-05 ENCOUNTER — Other Ambulatory Visit: Payer: Self-pay | Admitting: Pulmonary Disease

## 2017-08-06 DIAGNOSIS — H10413 Chronic giant papillary conjunctivitis, bilateral: Secondary | ICD-10-CM | POA: Diagnosis not present

## 2017-08-06 DIAGNOSIS — H04123 Dry eye syndrome of bilateral lacrimal glands: Secondary | ICD-10-CM | POA: Diagnosis not present

## 2017-08-06 DIAGNOSIS — H10023 Other mucopurulent conjunctivitis, bilateral: Secondary | ICD-10-CM | POA: Diagnosis not present

## 2017-08-07 NOTE — Telephone Encounter (Signed)
Refill request alprazolam 0.25mg , # 90 with the instructions for pt to take 1/2 to 1 tablet by mouth 3 times a day as needed for anxiety.  Med was last filled 12/03/16 with 5 RF and pt was last seen at office 05/05/17.  Dr. Lenna Gilford, please advise if it is okay for [place] to refill this med for pt.  Thanks!  Allergies  Allergen Reactions  . Enablex [Darifenacin Hydrobromide Er] Other (See Comments)    Caused her throat and mouth to have bumps and feel like it was swelling  . Clarithromycin Swelling    REACTION: causes her mouth to swell  . Latanoprost Itching    Eye reddness and swelling and itching  . Codeine Nausea Only  . Morphine Nausea And Vomiting  . Sulfonamide Derivatives Other (See Comments)    REACTION: unsure of reaction     Current Outpatient Medications:  .  acetaminophen (TYLENOL) 325 MG tablet, Take 325 mg by mouth every 4 (four) hours as needed for mild pain or moderate pain. , Disp: , Rfl:  .  allopurinol (ZYLOPRIM) 300 MG tablet, Take 1 tablet by mouth daily., Disp: , Rfl:  .  ALPRAZolam (XANAX) 0.25 MG tablet, TAKE 1/2-1 TABLET BY MOUTH 3 TIMES DAILY AS NEEDED FOR ANXIETY, Disp: 90 tablet, Rfl: 5 .  Artificial Tear Ointment (ARTIFICIAL TEARS) ointment, Place 1 drop into both eyes as needed., Disp: , Rfl:  .  Ascorbic Acid (VITAMIN C) 500 MG tablet, Take 500 mg by mouth daily.  , Disp: , Rfl:  .  aspirin 81 MG tablet, Take 81 mg by mouth daily.  , Disp: , Rfl:  .  azithromycin (ZITHROMAX) 250 MG tablet, Take 2 pills today then one a day for 4 additional days, Disp: 6 tablet, Rfl: 0 .  bimatoprost (LUMIGAN) 0.01 % SOLN, Place 1 drop into both eyes at bedtime., Disp: , Rfl:  .  Cholecalciferol (VITAMIN D) 1000 UNITS capsule, Take 1,000 Units by mouth daily.  , Disp: , Rfl:  .  diltiazem (CARDIZEM CD) 180 MG 24 hr capsule, TAKE ONE CAPSULE BY MOUTH EVERY DAY, Disp: 90 capsule, Rfl: 0 .  dorzolamide (TRUSOPT) 2 % ophthalmic solution, Place 1 drop into both eyes 2 (two) times  daily., Disp: , Rfl:  .  ferrous sulfate 325 (65 FE) MG tablet, Take 1 tablet (325 mg total) by mouth 2 (two) times daily with a meal., Disp: 60 tablet, Rfl: 2 .  folic acid (FOLVITE) 1 MG tablet, Take 1 tablet (1 mg total) by mouth daily., Disp: 30 tablet, Rfl: 1 .  furosemide (LASIX) 40 MG tablet, Take 2 tablets (80 mg total) by mouth every morning., Disp: 60 tablet, Rfl: 5 .  hydrOXYzine (ATARAX/VISTARIL) 25 MG tablet, Take 1 tablet (25 mg total) by mouth every 6 (six) hours as needed., Disp: 50 tablet, Rfl: 0 .  latanoprost (XALATAN) 0.005 % ophthalmic solution, Place 1 drop into both eyes 2 (two) times daily., Disp: , Rfl:  .  levothyroxine (SYNTHROID, LEVOTHROID) 125 MCG tablet, TAKE 1/2 TABLET BY MOUTH DAILY BEFORE BREAKFAST, Disp: 45 tablet, Rfl: 3 .  loperamide (IMODIUM) 2 MG capsule, Take 1 capsule (2 mg total) by mouth as needed for diarrhea or loose stools., Disp: 10 capsule, Rfl: 0 .  Multiple Vitamin (MULTIVITAMIN WITH MINERALS) TABS tablet, Take 1 tablet by mouth daily., Disp: 30 tablet, Rfl: 0 .  mupirocin ointment (BACTROBAN) 2 %, 1 APPLICATION APPLY ON THE SKIN AS DIRECTED APPLY TO AFFECTED AREA ON HAND  AND COVER AS DIRECTED, Disp: , Rfl: 0 .  nepafenac (ILEVRO) 0.3 % ophthalmic suspension, Place 1 drop into the right eye daily., Disp: , Rfl:  .  Omega-3 Fatty Acids (FISH OIL) 1000 MG CAPS, Take 1 capsule by mouth daily., Disp: , Rfl:  .  omeprazole (PRILOSEC) 20 MG capsule, Take 20 mg by mouth daily.  , Disp: , Rfl:  .  ondansetron (ZOFRAN) 8 MG tablet, Take 1 tablet (8 mg total) by mouth every 6 (six) hours as needed for nausea or vomiting., Disp: 20 tablet, Rfl: 0 .  Polyethyl Glycol-Propyl Glycol (SYSTANE) 0.4-0.3 % SOLN, Place 1 drop into both eyes 4 (four) times daily. , Disp: , Rfl:  .  potassium chloride (KLOR-CON M10) 10 MEQ tablet, TAKE 2 TABLETS (20 MEQ TOTAL) BY MOUTH DAILY., Disp: 180 tablet, Rfl: 3 .  predniSONE (DELTASONE) 10 MG tablet, TAKE 1 TABLET (10 MG TOTAL) BY  MOUTH DAILY WITH BREAKFAST., Disp: 50 tablet, Rfl: 2 .  simvastatin (ZOCOR) 20 MG tablet, TAKE 1 TABLET (20 MG TOTAL) BY MOUTH DAILY., Disp: 90 tablet, Rfl: 0 .  thiamine 100 MG tablet, Take 1 tablet (100 mg total) by mouth daily., Disp: 30 tablet, Rfl: 1 .  traMADol (ULTRAM) 50 MG tablet, TAKE 1 TABLET BY MOUTH EVERY 8 HOURS AS NEEDED FOR PAIN, Disp: 90 tablet, Rfl: 5

## 2017-08-17 ENCOUNTER — Other Ambulatory Visit: Payer: Self-pay | Admitting: Pulmonary Disease

## 2017-08-18 ENCOUNTER — Ambulatory Visit
Admission: RE | Admit: 2017-08-18 | Discharge: 2017-08-18 | Disposition: A | Discharge status: Home / Self Care | Payer: Medicare Other | Source: Ambulatory Visit | Attending: Neurology | Admitting: Neurology

## 2017-08-18 DIAGNOSIS — R42 Dizziness and giddiness: Secondary | ICD-10-CM

## 2017-08-21 ENCOUNTER — Telehealth: Payer: Self-pay

## 2017-08-21 DIAGNOSIS — H04123 Dry eye syndrome of bilateral lacrimal glands: Secondary | ICD-10-CM | POA: Diagnosis not present

## 2017-08-21 DIAGNOSIS — H01024 Squamous blepharitis left upper eyelid: Secondary | ICD-10-CM | POA: Diagnosis not present

## 2017-08-21 DIAGNOSIS — H01021 Squamous blepharitis right upper eyelid: Secondary | ICD-10-CM | POA: Diagnosis not present

## 2017-08-21 DIAGNOSIS — H01025 Squamous blepharitis left lower eyelid: Secondary | ICD-10-CM | POA: Diagnosis not present

## 2017-08-21 DIAGNOSIS — H01022 Squamous blepharitis right lower eyelid: Secondary | ICD-10-CM | POA: Diagnosis not present

## 2017-08-21 NOTE — Telephone Encounter (Signed)
Spoke with Pt's daughter, advsd her of CT results. They are discouraged with the dizziness being persistant and no cause has been found and apparently none of the treatments tried have been effective. I encouraged her to call Dr Brendolyn Patty office and discuss the antidepressant Dr Tomi Likens mentioned. She will contact Dr Lenna Gilford

## 2017-08-21 NOTE — Telephone Encounter (Signed)
-----   Message from Pieter Partridge, DO sent at 08/19/2017  7:07 AM EST ----- CT of head does not reveal any specific cause of dizziness such as stroke or tumor.

## 2017-08-22 ENCOUNTER — Ambulatory Visit: Payer: Medicare Other | Admitting: Neurology

## 2017-08-31 ENCOUNTER — Other Ambulatory Visit: Payer: Self-pay | Admitting: Pulmonary Disease

## 2017-09-03 DIAGNOSIS — C44629 Squamous cell carcinoma of skin of left upper limb, including shoulder: Secondary | ICD-10-CM | POA: Diagnosis not present

## 2017-09-03 DIAGNOSIS — I872 Venous insufficiency (chronic) (peripheral): Secondary | ICD-10-CM | POA: Diagnosis not present

## 2017-09-03 DIAGNOSIS — I8312 Varicose veins of left lower extremity with inflammation: Secondary | ICD-10-CM | POA: Diagnosis not present

## 2017-09-03 DIAGNOSIS — Z85828 Personal history of other malignant neoplasm of skin: Secondary | ICD-10-CM | POA: Diagnosis not present

## 2017-09-03 DIAGNOSIS — I8311 Varicose veins of right lower extremity with inflammation: Secondary | ICD-10-CM | POA: Diagnosis not present

## 2017-09-03 DIAGNOSIS — L821 Other seborrheic keratosis: Secondary | ICD-10-CM | POA: Diagnosis not present

## 2017-09-04 ENCOUNTER — Ambulatory Visit: Payer: Medicare Other | Admitting: Pulmonary Disease

## 2017-09-12 ENCOUNTER — Encounter: Payer: Self-pay | Admitting: Pulmonary Disease

## 2017-09-12 NOTE — Telephone Encounter (Signed)
Error

## 2017-09-17 ENCOUNTER — Telehealth: Payer: Self-pay

## 2017-09-17 NOTE — Telephone Encounter (Signed)
On 09/17/17 I received a d/c from [facility] (original). The d/c is for burial. The patient is a patient of Doctor Lenna Gilford.  The d/c will be taken to [facility] for signature.  On 09/24/17 I received the d/c back from Doctor Lenna Gilford. I got the d/c ready and called the funeral home to let them know the d/c is ready for pickup.

## 2017-09-23 DIAGNOSIS — 419620001 Death: Secondary | SNOMED CT | POA: Diagnosis not present

## 2017-09-23 DEATH — deceased

## 2017-10-28 ENCOUNTER — Ambulatory Visit: Payer: Medicare Other | Admitting: Pulmonary Disease
